# Patient Record
Sex: Female | Born: 1980 | Race: White | Hispanic: No | Smoking: Former smoker
Health system: Southern US, Community
[De-identification: ages and names within clinical notes are randomized; demographics above are authoritative.]

## PROBLEM LIST (undated history)

## (undated) DIAGNOSIS — J40 Bronchitis, not specified as acute or chronic: Secondary | ICD-10-CM

## (undated) DIAGNOSIS — R002 Palpitations: Secondary | ICD-10-CM

## (undated) DIAGNOSIS — N7011 Chronic salpingitis: Secondary | ICD-10-CM

## (undated) DIAGNOSIS — N83299 Other ovarian cyst, unspecified side: Secondary | ICD-10-CM

## (undated) HISTORY — DX: Palpitations: R00.2

## (undated) HISTORY — DX: Chronic salpingitis: N70.11

## (undated) HISTORY — DX: Other ovarian cyst, unspecified side: N83.299

## (undated) HISTORY — PX: ORIF PELVIC FRACTURE: SUR948

---
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
## 2011-10-20 ENCOUNTER — Encounter (HOSPITAL_COMMUNITY): Payer: Self-pay

## 2011-10-20 ENCOUNTER — Emergency Department (HOSPITAL_COMMUNITY): Payer: Self-pay

## 2011-10-20 ENCOUNTER — Emergency Department (HOSPITAL_COMMUNITY)
Admission: EM | Admit: 2011-10-20 | Discharge: 2011-10-20 | Disposition: A | Discharge status: Home / Self Care | Payer: Self-pay | Attending: Emergency Medicine | Admitting: Emergency Medicine

## 2011-10-20 DIAGNOSIS — R109 Unspecified abdominal pain: Secondary | ICD-10-CM | POA: Insufficient documentation

## 2011-10-20 DIAGNOSIS — M549 Dorsalgia, unspecified: Secondary | ICD-10-CM | POA: Insufficient documentation

## 2011-10-20 DIAGNOSIS — R079 Chest pain, unspecified: Secondary | ICD-10-CM | POA: Insufficient documentation

## 2011-10-20 DIAGNOSIS — W1809XA Striking against other object with subsequent fall, initial encounter: Secondary | ICD-10-CM | POA: Insufficient documentation

## 2011-10-20 DIAGNOSIS — S2239XA Fracture of one rib, unspecified side, initial encounter for closed fracture: Secondary | ICD-10-CM

## 2011-10-20 MED ORDER — HYDROCODONE-ACETAMINOPHEN 5-325 MG PO TABS: 1.0000 | ORAL_TABLET | ORAL | Status: AC | PRN

## 2011-10-20 MED ORDER — OXYCODONE-ACETAMINOPHEN 5-325 MG PO TABS
1.0000 | ORAL_TABLET | Freq: Once | ORAL | Status: AC
  Administered 2011-10-20: 1 via ORAL
  Filled 2011-10-20: qty 1

## 2011-10-20 NOTE — ED Notes (Signed)
Pt tripped and fell into the bedpost in the dark, she complains of right rib pain

## 2011-10-20 NOTE — ED Provider Notes (Signed)
History     CSN: [id number]  Arrival date & time 10/20/11  8295   First MD Initiated Contact with Patient 10/20/11 [phone]      Chief Complaint  Patient presents with  . Flank Pain    (Consider location/radiation/quality/duration/timing/severity/associated sxs/prior treatment) HPI Comments: Patient states she was walking in her bedroom in the dark, slipped and fell against the bedpost hitting her posterior right rib area as is been sore for the last couple hours.  It hurts when she takes a deep breath.  There is no external bruising  Patient is a 31 y.o. female presenting with flank pain. The history is provided by the patient.  Flank Pain This is a new problem. The current episode started today. The problem occurs constantly. The problem has been unchanged. Associated symptoms include chest pain. Pertinent negatives include no abdominal pain, congestion, nausea or vomiting.    History reviewed. No pertinent past medical history.  History reviewed. No pertinent past surgical history.  History reviewed. No pertinent family history.  History  Substance Use Topics  . Smoking status: Not on file  . Smokeless tobacco: Not on file  . Alcohol Use: No    OB History    Grav Para Term Preterm Abortions TAB SAB Ect Mult Living                  Review of Systems  Constitutional: Negative for activity change.  HENT: Negative for congestion.   Respiratory: Negative for shortness of breath.   Cardiovascular: Positive for chest pain.  Gastrointestinal: Negative for nausea, vomiting and abdominal pain.  Genitourinary: Positive for flank pain. Negative for hematuria.  Musculoskeletal: Positive for back pain.  Neurological: Negative for dizziness.    Allergies  Review of patient's allergies indicates no known allergies.  Home Medications  No current outpatient prescriptions on file.  BP 126/64  Pulse 86  Temp(Src) 98.8 F (37.1 C) (Oral)  Resp 17  SpO2 100%  Physical Exam    Constitutional: She is oriented to person, place, and time. She appears well-developed and well-nourished.  HENT:  Head: Normocephalic.  Neck: Normal range of motion.  Cardiovascular: Normal rate.   Pulmonary/Chest:       Patient tender over the posterior lateral 8 through 12th ribs.  No external bruising  Abdominal: Soft.  Neurological: She is alert and oriented to person, place, and time.  Skin: Skin is warm and dry.    ED Course  Procedures (including critical care time)  Labs Reviewed - No data to display No results found.   No diagnosis found.    MDM  We'll x-ray ribs        Arman Filter, NP 10/20/11 [phone]

## 2011-10-20 NOTE — ED Provider Notes (Signed)
Medical screening examination/treatment/procedure(s) were performed by non-physician practitioner and as supervising physician I was immediately available for consultation/collaboration.   Celene Kras, MD 10/20/11 [phone]
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
## 2011-10-20 NOTE — Discharge Instructions (Signed)
Your x-ray showed that you have a fracture to your lower rib. You have been given a prescription for pain medication. It is important to take deep breaths several times a day to avoid lung infections. These fractures normally heal on their own and do well. Please return to the ED if you're having shortness of breath, high fever, or any other worrisome symptoms.  Rib Fracture Your caregiver has diagnosed you as having a rib fracture (a break). This can occur by a blow to the chest, by a fall against a hard object, or by violent coughing or sneezing. There may be one or many breaks. Rib fractures may heal on their own within 3 to 8 weeks. The longer healing period is usually associated with a continued cough or other aggravating activities. HOME CARE INSTRUCTIONS   Avoid strenuous activity. Be careful during activities and avoid bumping the injured rib. Activities that cause pain pull on the fracture site(s) and are best avoided if possible.   Eat a normal, well-balanced diet. Drink plenty of fluids to avoid constipation.   Take deep breaths several times a day to keep lungs free of infection. Try to cough several times a day, splinting the injured area with a pillow. This will help prevent pneumonia.   Do not wear a rib belt or binder. These restrict breathing which can lead to pneumonia.   Only take over-the-counter or prescription medicines for pain, discomfort, or fever as directed by your caregiver.  SEEK MEDICAL CARE IF:  You develop a continual cough, associated with thick or bloody sputum. SEEK IMMEDIATE MEDICAL CARE IF:   You have a fever.   You have difficulty breathing.   You have nausea (feeling sick to your stomach), vomiting, or abdominal (belly) pain.   You have worsening pain, not controlled with medications.  Document Released: 08/14/2005 Document Revised: 04/26/2011 Document Reviewed: 01/16/2007 [facility] Patient Information 2012 [place], [place].

## 2012-04-19 ENCOUNTER — Encounter (HOSPITAL_COMMUNITY): Payer: Self-pay | Admitting: Emergency Medicine

## 2012-04-19 ENCOUNTER — Emergency Department (HOSPITAL_COMMUNITY)
Admission: EM | Admit: 2012-04-19 | Discharge: 2012-04-19 | Disposition: A | Discharge status: Home / Self Care | Payer: Self-pay | Attending: Emergency Medicine | Admitting: Emergency Medicine

## 2012-04-19 DIAGNOSIS — S81009A Unspecified open wound, unspecified knee, initial encounter: Secondary | ICD-10-CM | POA: Insufficient documentation

## 2012-04-19 DIAGNOSIS — S91009A Unspecified open wound, unspecified ankle, initial encounter: Secondary | ICD-10-CM | POA: Insufficient documentation

## 2012-04-19 DIAGNOSIS — S5010XA Contusion of unspecified forearm, initial encounter: Secondary | ICD-10-CM | POA: Insufficient documentation

## 2012-04-19 DIAGNOSIS — S40019A Contusion of unspecified shoulder, initial encounter: Secondary | ICD-10-CM | POA: Insufficient documentation

## 2012-04-19 DIAGNOSIS — T148XXA Other injury of unspecified body region, initial encounter: Secondary | ICD-10-CM
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
## 2012-04-19 NOTE — ED Provider Notes (Signed)
Medical screening examination/treatment/procedure(s) were performed by non-physician practitioner and as supervising physician I was immediately available for consultation/collaboration.  Hurman Horn, MD 04/19/12 2147

## 2013-07-12 ENCOUNTER — Emergency Department (HOSPITAL_COMMUNITY)
Admission: EM | Admit: 2013-07-12 | Discharge: 2013-07-12 | Disposition: A | Discharge status: Home / Self Care | Payer: Self-pay | Attending: Emergency Medicine | Admitting: Emergency Medicine

## 2013-07-12 ENCOUNTER — Emergency Department (HOSPITAL_COMMUNITY): Payer: Self-pay

## 2013-07-12 ENCOUNTER — Encounter (HOSPITAL_COMMUNITY): Payer: Self-pay | Admitting: Emergency Medicine

## 2013-07-12 DIAGNOSIS — R05 Cough: Secondary | ICD-10-CM

## 2013-07-12 DIAGNOSIS — Z720 Tobacco use: Secondary | ICD-10-CM

## 2013-07-12 DIAGNOSIS — J029 Acute pharyngitis, unspecified: Secondary | ICD-10-CM | POA: Insufficient documentation

## 2013-07-12 DIAGNOSIS — F17200 Nicotine dependence, unspecified, uncomplicated: Secondary | ICD-10-CM | POA: Insufficient documentation

## 2013-07-12 DIAGNOSIS — H9209 Otalgia, unspecified ear: Secondary | ICD-10-CM | POA: Insufficient documentation

## 2013-07-12 DIAGNOSIS — R062 Wheezing: Secondary | ICD-10-CM | POA: Insufficient documentation

## 2013-07-12 DIAGNOSIS — R059 Cough, unspecified: Secondary | ICD-10-CM | POA: Insufficient documentation

## 2013-07-12 DIAGNOSIS — R079 Chest pain, unspecified: Secondary | ICD-10-CM | POA: Insufficient documentation

## 2013-07-12 MED ORDER — IPRATROPIUM BROMIDE 0.02 % IN SOLN
0.5000 mg | Freq: Once | RESPIRATORY_TRACT | Status: AC
  Administered 2013-07-12: 0.5 mg via RESPIRATORY_TRACT
  Filled 2013-07-12: qty 2.5

## 2013-07-12 MED ORDER — ALBUTEROL SULFATE (5 MG/ML) 0.5% IN NEBU
INHALATION_SOLUTION | RESPIRATORY_TRACT | Status: AC
  Filled 2013-07-12: qty 0.5

## 2013-07-12 MED ORDER — AZITHROMYCIN 250 MG PO TABS: ORAL_TABLET | ORAL | Status: DC

## 2013-07-12 MED ORDER — ALBUTEROL SULFATE (5 MG/ML) 0.5% IN NEBU
5.0000 mg | INHALATION_SOLUTION | Freq: Once | RESPIRATORY_TRACT | Status: AC
  Administered 2013-07-12: 5 mg via RESPIRATORY_TRACT
  Filled 2013-07-12: qty 1

## 2013-07-12 MED ORDER — PREDNISONE 20 MG PO TABS: ORAL_TABLET | ORAL | Status: DC

## 2013-07-12 MED ORDER — ACETAMINOPHEN-CODEINE #3 300-30 MG PO TABS
2.0000 | ORAL_TABLET | Freq: Once | ORAL | Status: AC
  Administered 2013-07-12: 2 via ORAL
  Filled 2013-07-12: qty 2

## 2013-07-12 MED ORDER — PREDNISONE 20 MG PO TABS
60.0000 mg | ORAL_TABLET | Freq: Once | ORAL | Status: AC
  Administered 2013-07-12: 60 mg via ORAL
  Filled 2013-07-12: qty 3

## 2013-07-12 MED ORDER — ALBUTEROL SULFATE HFA 108 (90 BASE) MCG/ACT IN AERS
2.0000 | INHALATION_SPRAY | Freq: Once | RESPIRATORY_TRACT | Status: AC
  Administered 2013-07-12: 2 via RESPIRATORY_TRACT
  Filled 2013-07-12: qty 6.7

## 2013-07-12 NOTE — ED Notes (Signed)
Pt states started with cough x 2 weeks worse in last 3 days.  Hx smoker with no asthma.  No fever noted. Wet cough with yellowish green sputum.  Causing pain to chest with cough and pain with breathing.

## 2013-07-12 NOTE — ED Provider Notes (Signed)
Medical screening examination/treatment/procedure(s) were performed by non-physician practitioner and as supervising physician I was immediately available for consultation/collaboration.    Celene Kras, MD 07/12/13 [phone]

## 2013-07-12 NOTE — ED Provider Notes (Signed)
CSN: [id number]     Arrival date & time 07/12/13  2014 History   First MD Initiated Contact with Patient 07/12/13 2037     Chief Complaint  Patient presents with  . Cough   (Consider location/radiation/quality/duration/timing/severity/associated sxs/prior Treatment) HPI Comments: Patient is a 32 year old female who presents today with 2 weeks of gradually worsening productive cough. She is spitting up green sputum. Her cough is worse at night. She has associated sore throat, ear pain, congestion, rhinorrhea. She denies any recent fevers. She's been taking DayQuil and NyQuil with no relief of her symptoms. Her husband is also sick. She smokes cigarettes, but has been too sick to smoke in the past 2 weeks. She denies any history of lung disease including asthma or COPD. She notes that now she has both right and left-sided rib pain from coughing so much. Her rib pain is worse with palpation.  The history is provided by the patient. No language interpreter was used.    No past medical history on file. Past Surgical History  Procedure Laterality Date  . Orif pelvic fracture     No family history on file. History  Substance Use Topics  . Smoking status: Current Every Day Smoker -- 0.50 packs/day    Types: Cigarettes  . Smokeless tobacco: Not on file  . Alcohol Use: No   OB History   Grav Para Term Preterm Abortions TAB SAB Ect Mult Living                 Review of Systems  Constitutional: Negative for fever and chills.  HENT: Positive for congestion, ear pain, rhinorrhea, sinus pressure, sore throat and voice change.   Respiratory: Positive for cough. Negative for shortness of breath.   Gastrointestinal: Negative for nausea, vomiting and abdominal pain.  All other systems reviewed and are negative.    Allergies  Review of patient's allergies indicates no known allergies.  Home Medications   Current Outpatient Rx  Name  Route  Sig  Dispense  Refill  . guaifenesin (ROBITUSSIN)  100 MG/5ML syrup   Oral   Take 200 mg by mouth 3 (three) times daily as needed for cough.          BP 124/55  Pulse 69  Temp(Src) 98.1 F (36.7 C) (Oral)  Resp 19  Ht 5\' 7"  (1.702 m)  Wt 127 lb (57.607 kg)  BMI 19.89 kg/m2  SpO2 95%  LMP 06/02/2013 Physical Exam  Nursing note and vitals reviewed. Constitutional: She is oriented to person, place, and time. She appears well-developed and well-nourished. No distress.  HENT:  Head: Normocephalic and atraumatic.  Right Ear: Tympanic membrane, external ear and ear canal normal.  Left Ear: Tympanic membrane, external ear and ear canal normal.  Nose: Nose normal.  Mouth/Throat: Uvula is midline, oropharynx is clear and moist and mucous membranes are normal. No oropharyngeal exudate, posterior oropharyngeal edema, posterior oropharyngeal erythema or tonsillar abscesses.  Eyes: Conjunctivae are normal.  Neck: Normal range of motion.  Cardiovascular: Normal rate, regular rhythm and normal heart sounds.   Pulmonary/Chest: Effort normal. No stridor. No respiratory distress. She has wheezes in the right upper field, the right middle field, the right lower field, the left upper field, the left middle field and the left lower field. She has rhonchi. She has no rales.    Abdominal: Soft. She exhibits no distension. There is no tenderness.  Musculoskeletal: Normal range of motion.  Neurological: She is alert and oriented to person, place, and time.  She has normal strength.  Skin: Skin is warm and dry. She is not diaphoretic. No erythema.  Psychiatric: She has a normal mood and affect. Her behavior is normal.    ED Course  Procedures (including critical care time) Labs Review Labs Reviewed - No data to display Imaging Review Dg Chest 2 View  07/12/2013   CLINICAL DATA:  Chest pain, shortness of breath, cough, history of smoking the  EXAM: CHEST  2 VIEW  COMPARISON:  10/20/2011  FINDINGS: Unchanged cardiac silhouette and mediastinal  contours. The lungs remain hyperexpanded with flattening of the bilateral hemidiaphragms. Nipple shadows overlie the bilateral mid lungs, unchanged. No focal airspace opacities. No pleural effusion or pneumothorax. No evidence of edema. No acute osseus abnormalities.  IMPRESSION: Hyperexpanded lungs without acute cardiopulmonary disease.   Electronically Signed   By: Simonne Come M.D.   On: 07/12/2013 21:23    EKG Interpretation   None       MDM   1. Cough   2. Tobacco abuse    Patient ambulated in ED with O2 saturations maintained >90, no current signs of respiratory distress. Lung exam improved after nebulizer treatment. Prednisone given in the ED and pt will be dc with 5 day burst. Pt states they are feeling significantly improved and would like to go home. She was given albuterol inhaler for home in ED. Will also d/c with Z-pack for CAP despite negative chest x ray due to duration of symptoms and patient being higher risk due to cigarette smoking. Return instructions given. Vital signs stable for discharge. Patient / Family / Caregiver informed of clinical course, understand medical decision-making process, and agree with plan.    Mora Bellman, PA-C 07/12/13 [phone]

## 2013-07-19 ENCOUNTER — Emergency Department (HOSPITAL_COMMUNITY)
Admission: EM | Admit: 2013-07-19 | Discharge: 2013-07-19 | Disposition: A | Discharge status: Home / Self Care | Payer: Self-pay | Attending: Emergency Medicine | Admitting: Emergency Medicine

## 2013-07-19 ENCOUNTER — Encounter (HOSPITAL_COMMUNITY): Payer: Self-pay | Admitting: Emergency Medicine

## 2013-07-19 ENCOUNTER — Emergency Department (HOSPITAL_COMMUNITY): Payer: Self-pay

## 2013-07-19 DIAGNOSIS — M25462 Effusion, left knee: Secondary | ICD-10-CM

## 2013-07-19 DIAGNOSIS — Z8709 Personal history of other diseases of the respiratory system: Secondary | ICD-10-CM | POA: Insufficient documentation

## 2013-07-19 DIAGNOSIS — M25469 Effusion, unspecified knee: Secondary | ICD-10-CM | POA: Insufficient documentation

## 2013-07-19 DIAGNOSIS — F17200 Nicotine dependence, unspecified, uncomplicated: Secondary | ICD-10-CM | POA: Insufficient documentation

## 2013-07-19 HISTORY — DX: Bronchitis, not specified as acute or chronic: J40

## 2013-07-19 MED ORDER — IBUPROFEN 800 MG PO TABS: 800.0000 mg | ORAL_TABLET | Freq: Three times a day (TID) | ORAL | Status: DC

## 2013-07-19 MED ORDER — HYDROCODONE-ACETAMINOPHEN 5-325 MG PO TABS
1.0000 | ORAL_TABLET | Freq: Once | ORAL | Status: AC
  Administered 2013-07-19: 1 via ORAL
  Filled 2013-07-19: qty 1

## 2013-07-19 MED ORDER — ALBUTEROL SULFATE HFA 108 (90 BASE) MCG/ACT IN AERS
2.0000 | INHALATION_SPRAY | Freq: Once | RESPIRATORY_TRACT | Status: AC
  Administered 2013-07-19: 2 via RESPIRATORY_TRACT
  Filled 2013-07-19: qty 6.7

## 2013-07-19 MED ORDER — HYDROCODONE-ACETAMINOPHEN 5-325 MG PO TABS: 1.0000 | ORAL_TABLET | Freq: Four times a day (QID) | ORAL | Status: DC | PRN

## 2013-07-19 NOTE — ED Notes (Signed)
Pt states her left knee has been painful for @ 1 month.  Pt states left knee is swollen, but it does not appear so.  Pt denies trauma to left knee.
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
## 2013-07-23 NOTE — ED Provider Notes (Signed)
I personally performed the services described in this documentation, which was scribed in my presence. The recorded information has been reviewed and is accurate.   Roney Marion, MD 07/23/13 [phone]

## 2014-07-12 ENCOUNTER — Emergency Department (HOSPITAL_COMMUNITY)
Admission: EM | Admit: 2014-07-12 | Discharge: 2014-07-12 | Disposition: A | Discharge status: Home / Self Care | Payer: Self-pay | Attending: Emergency Medicine | Admitting: Emergency Medicine

## 2014-07-12 ENCOUNTER — Emergency Department (HOSPITAL_COMMUNITY): Payer: Self-pay

## 2014-07-12 ENCOUNTER — Encounter (HOSPITAL_COMMUNITY): Payer: Self-pay | Admitting: *Deleted

## 2014-07-12 DIAGNOSIS — J019 Acute sinusitis, unspecified: Secondary | ICD-10-CM | POA: Insufficient documentation

## 2014-07-12 DIAGNOSIS — R05 Cough: Secondary | ICD-10-CM

## 2014-07-12 DIAGNOSIS — Z720 Tobacco use: Secondary | ICD-10-CM | POA: Insufficient documentation

## 2014-07-12 DIAGNOSIS — Z791 Long term (current) use of non-steroidal anti-inflammatories (NSAID): Secondary | ICD-10-CM | POA: Insufficient documentation

## 2014-07-12 DIAGNOSIS — R059 Cough, unspecified: Secondary | ICD-10-CM

## 2014-07-12 DIAGNOSIS — Z79899 Other long term (current) drug therapy: Secondary | ICD-10-CM | POA: Insufficient documentation

## 2014-07-12 DIAGNOSIS — M436 Torticollis: Secondary | ICD-10-CM | POA: Insufficient documentation

## 2014-07-12 DIAGNOSIS — M25511 Pain in right shoulder: Secondary | ICD-10-CM | POA: Insufficient documentation

## 2014-07-12 MED ORDER — HYDROCODONE-ACETAMINOPHEN 5-325 MG PO TABS
1.0000 | ORAL_TABLET | Freq: Once | ORAL | Status: AC
  Administered 2014-07-12: 1 via ORAL
  Filled 2014-07-12: qty 1

## 2014-07-12 MED ORDER — HYDROCODONE-ACETAMINOPHEN 5-325 MG PO TABS: ORAL_TABLET | ORAL | Status: DC

## 2014-07-12 MED ORDER — AMOXICILLIN 500 MG PO CAPS: 500.0000 mg | ORAL_CAPSULE | Freq: Three times a day (TID) | ORAL | Status: DC

## 2014-07-12 MED ORDER — AMOXICILLIN 500 MG PO CAPS
500.0000 mg | ORAL_CAPSULE | Freq: Once | ORAL | Status: AC
  Administered 2014-07-12: 500 mg via ORAL
  Filled 2014-07-12: qty 1

## 2014-07-12 NOTE — ED Notes (Signed)
Patient transported to X-ray 

## 2014-07-12 NOTE — Discharge Instructions (Signed)
Use nasal saline (you can try [employer] Simply Saline) at least 4 times a day, use saline 5-10 minutes before using the fluticasone (flonase)  Do not use Afrin (Oxymetazoline)  Rest, wash hands frequently  and drink plenty of water.  Take vicodin for breakthrough pain, do not drink alcohol, drive, care for children or do other critical tasks while taking vicodin.  Take your antibiotics as directed and to completion. You should never have any leftover antibiotics! Push fluids and stay well hydrated.   Do not hesitate to return to the emergency room for any new, worsening or concerning symptoms.  Please obtain primary care using resource guide below. But the minute you were seen in the emergency room and that they will need to obtain records for further outpatient management.

## 2014-07-12 NOTE — ED Provider Notes (Signed)
CSN: [id number]     Arrival date & time 07/12/14  2222 History   First MD Initiated Contact with Patient 07/12/14 2240     Chief Complaint  Patient presents with  . Nasal Congestion  . Torticollis  . Cough     (Consider location/radiation/quality/duration/timing/severity/associated sxs/prior Treatment) HPI   Morgan Donaldson is a 33 y.o. female with no significant past medical history complaining of rhinorrhea, nasal congestion, productive cough, sore throat, hoarse voice and pulling sensation to right lateral trapezius worsening over the course of the week. Patient denies fever, chills, nausea, vomiting, change in bowel or bladder habits. On review of systems she notes a decreased by mouth intake, pleuritic chest pain described as burning, rated at 4 out of 10 and shortness of breath onset yesterday.   Past Medical History  Diagnosis Date  . Bronchitis    Past Surgical History  Procedure Laterality Date  . Orif pelvic fracture     History reviewed. No pertinent family history. History  Substance Use Topics  . Smoking status: Current Every Day Smoker -- 0.50 packs/day    Types: Cigarettes  . Smokeless tobacco: Not on file  . Alcohol Use: No   OB History    No data available     Review of Systems  10 systems reviewed and found to be negative, except as noted in the HPI.    Allergies  Review of patient's allergies indicates no known allergies.  Home Medications   Prior to Admission medications   Medication Sig Start Date End Date Taking? Authorizing Provider  albuterol (PROVENTIL HFA;VENTOLIN HFA) 108 (90 BASE) MCG/ACT inhaler Inhale 1 puff into the lungs every 6 (six) hours as needed for wheezing or shortness of breath.    Historical Provider, MD  HYDROcodone-acetaminophen (NORCO) 5-325 MG per tablet Take 1 tablet by mouth every 6 (six) hours as needed. 07/19/13   Tatyana A Kirichenko, PA-C  ibuprofen (ADVIL,MOTRIN) 200 MG tablet Take 400 mg by mouth every 6 (six)  hours as needed for moderate pain.    Historical Provider, MD  ibuprofen (ADVIL,MOTRIN) 800 MG tablet Take 1 tablet (800 mg total) by mouth 3 (three) times daily. 07/19/13   Tatyana A Kirichenko, PA-C   BP 143/67 mmHg  Pulse 84  Temp(Src) 98 F (36.7 C) (Oral)  Resp 16  Ht 5\' 7"  (1.702 m)  Wt 133 lb (60.328 kg)  BMI 20.83 kg/m2  SpO2 99%  LMP 06/27/2014 (Approximate) Physical Exam  Constitutional: She is oriented to person, place, and time. She appears well-developed and well-nourished. No distress.  Hoarse voice  HENT:  Head: Normocephalic and atraumatic.  Mouth/Throat: Oropharynx is clear and moist.  Positive rhinorrhea, posterior pharynx is mildly injected, no tonsillar hypertrophy or exudate. The bilateral frontal and maxillary sinuses are tender to palpation. Bilateral tympanic membranes with normal architecture light reflex.  Eyes: Conjunctivae and EOM are normal. Pupils are equal, round, and reactive to light.  Neck: Normal range of motion. Neck supple.  FROM to C-spine. Pt can touch chin to chest without discomfort. No TTP of midline cervical spine.  Cardiovascular: Normal rate, regular rhythm and intact distal pulses.   Pulmonary/Chest: Effort normal and breath sounds normal. No stridor. No respiratory distress. She has no wheezes. She has no rales. She exhibits no tenderness.  Abdominal: Soft. Bowel sounds are normal. She exhibits no distension and no mass. There is no tenderness. There is no rebound and no guarding.  Musculoskeletal: Normal range of motion. She exhibits no edema or  tenderness.       Arms: Neurological: She is alert and oriented to person, place, and time.  Psychiatric: She has a normal mood and affect.  Nursing note and vitals reviewed.   ED Course  Procedures (including critical care time) Labs Review Labs Reviewed - No data to display  Imaging Review No results found.   EKG Interpretation None      MDM   Final diagnoses:  Acute sinusitis,  recurrence not specified, unspecified location    Filed Vitals:   07/12/14 2228 07/12/14 2300  BP: 143/67   Pulse: 84   Temp: 98 F (36.7 C) 98.3 F (36.8 C)  TempSrc: Oral   Resp: 16   Height: 5\' 7"  (1.702 m)   Weight: 133 lb (60.328 kg)   SpO2: 99%     Medications  HYDROcodone-acetaminophen (NORCO/VICODIN) 5-325 MG per tablet 1 tablet (1 tablet Oral Given 07/12/14 2307)  amoxicillin (AMOXIL) capsule 500 mg (500 mg Oral Given 07/12/14 2307)    Morgan Donaldson is a 33 y.o. female presenting with rhinorrhea, sinus pressure, hoarse voice, pleuritic CP, SOB. Pt saturating well on RA, CXR w/o infiltrate. PE c/w acute sinuitis.    Evaluation does not show pathology that would require ongoing emergent intervention or inpatient treatment. Pt is hemodynamically stable and mentating appropriately. Discussed findings and plan with patient/guardian, who agrees with care plan. All questions answered. Return precautions discussed and outpatient follow up given.   Discharge Medication List as of 07/12/2014 11:05 PM    START taking these medications   Details  amoxicillin (AMOXIL) 500 MG capsule Take 1 capsule (500 mg total) by mouth 3 (three) times daily., Starting 07/12/2014, Until Discontinued, Print            Wynetta Emeryicole Amire Leazer, PA-C 07/12/14 2346  Lyanne CoKevin M Campos, MD 07/15/14 [phone]

## 2014-07-12 NOTE — ED Notes (Signed)
Pt c/o nasal congestion, cough, and right neck/shoulder stiffness for a week. No reported fevers at home. Pt is taking OTC medications without relief.

## 2015-04-21 ENCOUNTER — Emergency Department (HOSPITAL_COMMUNITY)
Admission: EM | Admit: 2015-04-21 | Discharge: 2015-04-21 | Disposition: A | Discharge status: Home / Self Care | Payer: Self-pay | Attending: Emergency Medicine | Admitting: Emergency Medicine

## 2015-04-21 ENCOUNTER — Encounter (HOSPITAL_COMMUNITY): Payer: Self-pay | Admitting: Adult Health

## 2015-04-21 DIAGNOSIS — Z720 Tobacco use: Secondary | ICD-10-CM | POA: Insufficient documentation

## 2015-04-21 DIAGNOSIS — J040 Acute laryngitis: Secondary | ICD-10-CM | POA: Insufficient documentation

## 2015-04-21 DIAGNOSIS — Z79899 Other long term (current) drug therapy: Secondary | ICD-10-CM | POA: Insufficient documentation

## 2015-04-21 MED ORDER — NAPROXEN 250 MG PO TABS: 250.0000 mg | ORAL_TABLET | Freq: Two times a day (BID) | ORAL | Status: DC

## 2015-04-21 MED ORDER — PREDNISONE 20 MG PO TABS: 40.0000 mg | ORAL_TABLET | Freq: Every day | ORAL | Status: DC

## 2015-04-21 MED ORDER — BENZONATATE 100 MG PO CAPS: 200.0000 mg | ORAL_CAPSULE | Freq: Two times a day (BID) | ORAL | Status: DC | PRN

## 2015-04-21 NOTE — ED Notes (Signed)
Presents with sore throat and hoarse voice-started about 3-4 days she has been sent home from work 2 times. She is drinking mountain dew and handling secretions. Denies fevers

## 2015-04-21 NOTE — ED Notes (Signed)
Pt is a pack a day smoker, but has reduced her smoking to approx 7-10/day cigarettes during her illness

## 2015-04-21 NOTE — Discharge Instructions (Signed)
Laryngitis °At the top of your windpipe is your voice box. It is the source of your voice. Inside your voice box are 2 bands of muscles called vocal cords. When you breathe, your vocal cords are relaxed and open so that air can get into the lungs. When you decide to say something, these cords come together and vibrate. The sound from these vibrations goes into your throat and comes out through your mouth as sound.  °Laryngitis is an inflammation of the vocal cords that causes hoarseness, cough, loss of voice, sore throat, and dry throat. Laryngitis can be temporary (acute) or long-term (chronic). Most cases of acute laryngitis improve with time.Chronic laryngitis lasts for more than 3 weeks. °CAUSES °Laryngitis can often be related to excessive smoking, talking, or yelling, as well as inhalation of toxic fumes and allergies. Acute laryngitis is usually caused by a viral infection, vocal strain, measles or mumps, or bacterial infections. Chronic laryngitis is usually caused by vocal cord strain, vocal cord injury, postnasal drip, growths on the vocal cords, or acid reflux. °SYMPTOMS  °· Cough. °· Sore throat. °· Dry throat. °RISK FACTORS °· Respiratory infections. °· Exposure to irritating substances, such as cigarette smoke, excessive amounts of alcohol, stomach acids, and workplace chemicals. °· Voice trauma, such as vocal cord injury from shouting or speaking too loud. °DIAGNOSIS  °Your cargiver will perform a physical exam. During the physical exam, your caregiver will examine your throat. The most common sign of laryngitis is hoarseness. Laryngoscopy may be necessary to confirm the diagnosis of this condition. This procedure allows your caregiver to look into the larynx. °HOME CARE INSTRUCTIONS °· Drink enough fluids to keep your urine clear or pale yellow. °· Rest until you no longer have symptoms or as directed by your caregiver. °· Breathe in moist air. °· Take all medicine as directed by your  caregiver. °· Do not smoke. °· Talk as little as possible (this includes whispering). °· Write on paper instead of talking until your voice is back to normal. °· Follow up with your caregiver if your condition has not improved after 10 days. °SEEK MEDICAL CARE IF:  °· You have trouble breathing. °· You cough up blood. °· You have persistent fever. °· You have increasing pain. °· You have difficulty swallowing. °MAKE SURE YOU: °· Understand these instructions. °· Will watch your condition. °· Will get help right away if you are not doing well or get worse. °Document Released: 08/14/2005 Document Revised: 11/06/2011 Document Reviewed: 10/20/2010 °[employer]® Patient Information ©2015 [employer]. This information is not intended to replace advice given to you by your health care provider. Make sure you discuss any questions you have with your health care provider. ° °Viral Infections °A viral infection can be caused by different types of viruses. Most viral infections are not serious and resolve on their own. However, some infections may cause severe symptoms and may lead to further complications. °SYMPTOMS °Viruses can frequently cause: °· Minor sore throat. °· Aches and pains. °· Headaches. °· Runny nose. °· Different types of rashes. °· Watery eyes. °· Tiredness. °· Cough. °· Loss of appetite. °· Gastrointestinal infections, resulting in nausea, vomiting, and diarrhea. °These symptoms do not respond to antibiotics because the infection is not caused by bacteria. However, you might catch a bacterial infection following the viral infection. This is sometimes called a "superinfection." Symptoms of such a bacterial infection may include: °· Worsening sore throat with pus and difficulty swallowing. °· Swollen neck glands. °· Chills and a high   or persistent fever. °· Severe headache. °· Tenderness over the sinuses. °· Persistent overall ill feeling (malaise), muscle aches, and tiredness (fatigue). °· Persistent  cough. °· Yellow, green, or brown mucus production with coughing. °HOME CARE INSTRUCTIONS  °· Only take over-the-counter or prescription medicines for pain, discomfort, diarrhea, or fever as directed by your caregiver. °· Drink enough water and fluids to keep your urine clear or pale yellow. Sports drinks can provide valuable electrolytes, sugars, and hydration. °· Get plenty of rest and maintain proper nutrition. Soups and broths with crackers or rice are fine. °SEEK IMMEDIATE MEDICAL CARE IF:  °· You have severe headaches, shortness of breath, chest pain, neck pain, or an unusual rash. °· You have uncontrolled vomiting, diarrhea, or you are unable to keep down fluids. °· You or your child has an oral temperature above 102° F (38.9° C), not controlled by medicine. °· Your baby is older than 3 months with a rectal temperature of 102° F (38.9° C) or higher. °· Your baby is 3 months old or younger with a rectal temperature of 100.4° F (38° C) or higher. °MAKE SURE YOU:  °· Understand these instructions. °· Will watch your condition. °· Will get help right away if you are not doing well or get worse. °Document Released: 05/24/2005 Document Revised: 11/06/2011 Document Reviewed: 12/19/2010 °Exit[employer]® Patient Information ©2015 [employer]. This information is not intended to replace advice given to you by your health care provider. Make sure you discuss any questions you have with your health care provider. ° °

## 2015-04-21 NOTE — ED Provider Notes (Signed)
CSN: [id number]     Arrival date & time 04/21/15  4098 History  This chart was scribed for non-physician practitioner, Everlene Farrier, PA-C working with Samuel Jester, DO by Gwenyth Ober, ED scribe. This patient was seen in room TR11C/TR11C and the patient's care was started at 8:03 PM   Chief Complaint  Patient presents with  . Laryngitis   The history is provided by the patient. No language interpreter was used.    HPI Comments: Morgan Donaldson is a 34 y.o. female who presents to the Emergency Department complaining of a constant, moderate hoarse voice that started 3 days ago. She states dry cough and wheezing as associated symptoms. Pt has tried drinking hot tea with honey with no relief. Pt works as a Production assistant, radio. She denies recent sick contact. Pt also denies rhinorrhea, post-nasal drip, trouble swallowing, ear pain, neck pain, HA, changes to vision, fever, chills, CP, SOB, chest tightness, abdominal pain, nausea, vomiting and rash as associated symptoms. Patient is a smoker.    Past Medical History  Diagnosis Date  . Bronchitis    Past Surgical History  Procedure Laterality Date  . Orif pelvic fracture     History reviewed. No pertinent family history. Social History  Substance Use Topics  . Smoking status: Current Every Day Smoker -- 0.50 packs/day    Types: Cigarettes  . Smokeless tobacco: None  . Alcohol Use: No   OB History    No data available     Review of Systems  Constitutional: Negative for fever and chills.  HENT: Positive for voice change. Negative for ear pain, postnasal drip, rhinorrhea, sore throat and trouble swallowing.   Eyes: Negative for visual disturbance.  Respiratory: Positive for cough. Negative for chest tightness and shortness of breath.   Cardiovascular: Negative for chest pain.  Gastrointestinal: Negative for nausea, vomiting and abdominal pain.  Skin: Negative for rash.  Neurological: Negative for light-headedness and headaches.       Allergies  Review of patient's allergies indicates no known allergies.  Home Medications   Prior to Admission medications   Medication Sig Start Date End Date Taking? Authorizing Provider  albuterol (PROVENTIL HFA;VENTOLIN HFA) 108 (90 BASE) MCG/ACT inhaler Inhale 1 puff into the lungs every 6 (six) hours as needed for wheezing or shortness of breath.    Historical Provider, MD  amoxicillin (AMOXIL) 500 MG capsule Take 1 capsule (500 mg total) by mouth 3 (three) times daily. 07/12/14   Nicole Pisciotta, PA-C  benzonatate (TESSALON) 100 MG capsule Take 2 capsules (200 mg total) by mouth 2 (two) times daily as needed for cough. 04/21/15   Everlene Farrier, PA-C  HYDROcodone-acetaminophen (NORCO/VICODIN) 5-325 MG per tablet Take 1-2 tablets by mouth every 6 hours as needed for pain. 07/12/14   Nicole Pisciotta, PA-C  ibuprofen (ADVIL,MOTRIN) 200 MG tablet Take 400 mg by mouth every 6 (six) hours as needed for moderate pain.    Historical Provider, MD  ibuprofen (ADVIL,MOTRIN) 800 MG tablet Take 1 tablet (800 mg total) by mouth 3 (three) times daily. 07/19/13   Tatyana Kirichenko, PA-C  naproxen (NAPROSYN) 250 MG tablet Take 1 tablet (250 mg total) by mouth 2 (two) times daily with a meal. 04/21/15   Everlene Farrier, PA-C  predniSONE (DELTASONE) 20 MG tablet Take 2 tablets (40 mg total) by mouth daily. 04/21/15   Everlene Farrier, PA-C   BP 112/43 mmHg  Pulse 66  Temp(Src) 98.4 F (36.9 C) (Oral)  Resp 18  Ht 5\' 7"  (1.702 m)  Wt 130 lb (58.968 kg)  BMI 20.36 kg/m2  SpO2 99% Physical Exam  Constitutional: She is oriented to person, place, and time. She appears well-developed and well-nourished. No distress.  Non-toxic appearing  HENT:  Head: Normocephalic and atraumatic.  Right Ear: External ear normal.  Left Ear: External ear normal.  Mouth/Throat: Oropharynx is clear and moist. No oropharyngeal exudate.  No tonsillar hypertrophy or exudates. Uvula midline, no edema. No  post-oropharyngeal erythema or edema. Bilateral tympanic membranes are pearly-gray without erythema or loss of landmarks.   Eyes: Conjunctivae are normal. Pupils are equal, round, and reactive to light. Right eye exhibits no discharge. Left eye exhibits no discharge.  Neck: Normal range of motion. Neck supple. No JVD present. No tracheal deviation present.  Cardiovascular: Normal rate, regular rhythm, normal heart sounds and intact distal pulses.   Pulmonary/Chest: Effort normal and breath sounds normal. No respiratory distress. She has no wheezes. She has no rales.  Lungs are clear to auscultation bilaterally. No wheezes or rhonchi.  Abdominal: Soft. There is no tenderness.  Lymphadenopathy:    She has no cervical adenopathy.  Neurological: She is alert and oriented to person, place, and time. Coordination normal.  Skin: Skin is warm and dry. No rash noted. She is not diaphoretic.  Psychiatric: She has a normal mood and affect. Her behavior is normal.  Nursing note and vitals reviewed.   ED Course  Procedures   DIAGNOSTIC STUDIES: Oxygen Saturation is 99% on RA, normal by my interpretation.    COORDINATION OF CARE: 8:08 PM Discussed treatment plan with pt which includes prednisone, Tessalon Pearls and Naprosyn. Pt agreed to plan.   Labs Review Labs Reviewed - No data to display  Imaging Review No results found.   EKG Interpretation None      Filed Vitals:   04/21/15 1940  BP: 112/43  Pulse: 66  Temp: 98.4 F (36.9 C)  TempSrc: Oral  Resp: 18  Height:  (1.702 m)  Weight: 130 lb (58.968 kg)  SpO2: 99%     MDM   Meds given in ED:  Medications - No data to display  New Prescriptions   BENZONATATE (TESSALON) 100 MG CAPSULE    Take 2 capsules (200 mg total) by mouth 2 (two) times daily as needed for cough.   NAPROXEN (NAPROSYN) 250 MG TABLET    Take 1 tablet (250 mg total) by mouth 2 (two) times daily with a meal.   PREDNISONE (DELTASONE) 20 MG TABLET    Take  2 tablets (40 mg total) by mouth daily.    Final diagnoses:  Acute laryngitis   This is a 35 y.o. female who presents to the Emergency Department complaining of a constant, moderate hoarse voice that started 3 days ago. She states dry cough and wheezing as associated symptoms.  She denies trouble swallowing. She denies any fevers or chills. The patient is a smoker. On exam the patient is afebrile and nontoxic appearing. She has no tonsillar hypertrophy or exudates. No posterior oropharyngeal edema. Lungs clear to auscultation bilaterally. Patient's symptoms and exam are consistent with laryngitis and URI. Will discharge prescriptions for Tessalon Perles, Prednisone and naproxen. I advised the patient to follow-up with their primary care provider this week. I advised the patient to return to the emergency department with new or worsening symptoms or new concerns. The patient verbalized understanding and agreement with plan.    I personally performed the services described in this documentation, which was scribed in my presence. The  recorded information has been reviewed and is accurate.    Everlene Farrier, PA-C 04/21/15 2023  Samuel Jester, DO 04/23/15 1356

## 2015-06-17 ENCOUNTER — Emergency Department (HOSPITAL_COMMUNITY)
Admission: EM | Admit: 2015-06-17 | Discharge: 2015-06-17 | Disposition: A | Discharge status: Home / Self Care | Payer: Self-pay | Attending: Emergency Medicine | Admitting: Emergency Medicine

## 2015-06-17 ENCOUNTER — Emergency Department (HOSPITAL_COMMUNITY): Payer: Self-pay

## 2015-06-17 ENCOUNTER — Encounter (HOSPITAL_COMMUNITY): Payer: Self-pay | Admitting: Emergency Medicine

## 2015-06-17 DIAGNOSIS — Z792 Long term (current) use of antibiotics: Secondary | ICD-10-CM | POA: Insufficient documentation

## 2015-06-17 DIAGNOSIS — S8992XA Unspecified injury of left lower leg, initial encounter: Secondary | ICD-10-CM | POA: Insufficient documentation

## 2015-06-17 DIAGNOSIS — W010XXA Fall on same level from slipping, tripping and stumbling without subsequent striking against object, initial encounter: Secondary | ICD-10-CM | POA: Insufficient documentation

## 2015-06-17 DIAGNOSIS — Z720 Tobacco use: Secondary | ICD-10-CM | POA: Insufficient documentation

## 2015-06-17 DIAGNOSIS — Z7952 Long term (current) use of systemic steroids: Secondary | ICD-10-CM | POA: Insufficient documentation

## 2015-06-17 DIAGNOSIS — Y9389 Activity, other specified: Secondary | ICD-10-CM | POA: Insufficient documentation

## 2015-06-17 DIAGNOSIS — Z79899 Other long term (current) drug therapy: Secondary | ICD-10-CM | POA: Insufficient documentation

## 2015-06-17 DIAGNOSIS — Z791 Long term (current) use of non-steroidal anti-inflammatories (NSAID): Secondary | ICD-10-CM | POA: Insufficient documentation

## 2015-06-17 DIAGNOSIS — Y990 Civilian activity done for income or pay: Secondary | ICD-10-CM | POA: Insufficient documentation

## 2015-06-17 DIAGNOSIS — Y92090 Kitchen in other non-institutional residence as the place of occurrence of the external cause: Secondary | ICD-10-CM | POA: Insufficient documentation

## 2015-06-17 DIAGNOSIS — M25562 Pain in left knee: Secondary | ICD-10-CM

## 2015-06-17 MED ORDER — OXYCODONE-ACETAMINOPHEN 5-325 MG PO TABS: 2.0000 | ORAL_TABLET | ORAL | Status: DC | PRN

## 2015-06-17 MED ORDER — IBUPROFEN 800 MG PO TABS: 800.0000 mg | ORAL_TABLET | Freq: Three times a day (TID) | ORAL | Status: DC

## 2015-06-17 MED ORDER — OXYCODONE-ACETAMINOPHEN 5-325 MG PO TABS
1.0000 | ORAL_TABLET | Freq: Once | ORAL | Status: AC
  Administered 2015-06-17: 1 via ORAL
  Filled 2015-06-17: qty 1

## 2015-06-17 MED ORDER — IBUPROFEN 400 MG PO TABS
800.0000 mg | ORAL_TABLET | Freq: Once | ORAL | Status: DC
  Filled 2015-06-17: qty 2

## 2015-06-17 NOTE — ED Provider Notes (Signed)
CSN: [id number]     Arrival date & time 06/17/15  2131 History  By signing my name below, I, Lyndel SafeKaitlyn Shelton, attest that this documentation has been prepared under the direction and in the presence of Jaila Schellhorn, PA-C. Electronically Signed: Lyndel SafeKaitlyn Shelton, ED Scribe. 06/17/2015. 9:50 PM.   Chief Complaint  Patient presents with  . Knee Injury   The history is provided by the patient. No language interpreter was used.   HPI Comments: Morgan Donaldson is a 34 y.o. female, with a PMhx of arthritis in left knee, who presents to the Emergency Department complaining of sudden onset, constant, 5/10, lateral and medial left knee pain that woke her up from sleep early this morning s/p twist injury that occurred 2 days ago. 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 She notes a PMhx of arthritis in her left knee. Denies LOC or head injury during the fall, neck pain, back pain, weakness, numbness or tingling. She is ambulatory but with pain.  No overlying skin changes or swelling to left knee. Pt is requesting a work note as she missed work today due to her pain with ambulation. Pt has tried tylenol earlier today with no decrease in pain.   Past Medical History  Diagnosis Date  . Bronchitis    Past Surgical History  Procedure Laterality Date  . Orif pelvic fracture     No family history on file. Social History  Substance Use Topics  . Smoking status: Current Every Day Smoker -- 0.00 packs/day    Types: Cigarettes  . Smokeless tobacco: None  . Alcohol Use: No   OB History    No data available     Review of Systems  Genitourinary: Negative for pelvic pain.  Musculoskeletal: Positive for arthralgias ( left knee). Negative for back pain, joint swelling, gait problem and neck pain.  Skin: Negative for color change and wound.  Neurological: Negative for syncope, weakness and numbness.   Allergies   Review of patient's allergies indicates no known allergies.  Home Medications   Prior to Admission medications   Medication Sig Start Date End Date Taking? Authorizing Provider  albuterol (PROVENTIL HFA;VENTOLIN HFA) 108 (90 BASE) MCG/ACT inhaler Inhale 1 puff into the lungs every 6 (six) hours as needed for wheezing or shortness of breath.    Historical Provider, MD  amoxicillin (AMOXIL) 500 MG capsule Take 1 capsule (500 mg total) by mouth 3 (three) times daily. 07/12/14   Nicole Pisciotta, PA-C  benzonatate (TESSALON) 100 MG capsule Take 2 capsules (200 mg total) by mouth 2 (two) times daily as needed for cough. 04/21/15   Everlene FarrierWilliam Dansie, PA-C  HYDROcodone-acetaminophen (NORCO/VICODIN) 5-325 MG per tablet Take 1-2 tablets by mouth every 6 hours as needed for pain. 07/12/14   Nicole Pisciotta, PA-C  ibuprofen (ADVIL,MOTRIN) 200 MG tablet Take 400 mg by mouth every 6 (six) hours as needed for moderate pain.    Historical Provider, MD  ibuprofen (ADVIL,MOTRIN) 800 MG tablet Take 1 tablet (800 mg total) by mouth 3 (three) times daily. 07/19/13   Tatyana Kirichenko, PA-C  ibuprofen (ADVIL,MOTRIN) 800 MG tablet Take 1 tablet (800 mg total) by mouth 3 (three) times daily. 06/17/15   Wilborn Membreno C Tamila Gaulin, PA-C  naproxen (NAPROSYN) 250 MG tablet Take 1 tablet (250 mg total) by mouth 2 (two) times daily with a meal. 04/21/15   Everlene FarrierWilliam Dansie, PA-C  oxyCODONE-acetaminophen (  PERCOCET/ROXICET) 5-325 MG tablet Take 2 tablets by mouth every 4 (four) hours as needed for severe pain. 06/17/15   Chalese Peach C Zyra Parrillo, PA-C  predniSONE (DELTASONE) 20 MG tablet Take 2 tablets (40 mg total) by mouth daily. 04/21/15   Everlene Farrier, PA-C   BP 121/50 mmHg  Pulse 86  Temp(Src) 98.3 F (36.8 C) (Oral)  Resp 18  Ht  (1.702 m)  Wt 135 lb (61.236 kg)  BMI 21.14 kg/m2  SpO2 100%  LMP 05/25/2015 Physical Exam  Constitutional: She appears well-developed and well-nourished. No distress.  HENT:  Head: Normocephalic and  atraumatic.  Eyes: Conjunctivae are normal.  Neck: Normal range of motion. Neck supple.  Cardiovascular: Normal rate, regular rhythm and intact distal pulses.   Pulmonary/Chest: Effort normal. No respiratory distress.  Musculoskeletal:  Decreased ROM with flexion and extension of knee due to pain. No laxity or effusion noted.   Neurological: She is alert. Coordination normal.  No neurologic deficits. Strength 5/5. Hobbling gait, pt states due to pain.   Skin: Skin is warm and dry.  Psychiatric: She has a normal mood and affect. Her behavior is normal.  Nursing note and vitals reviewed.   ED Course  Procedures  DIAGNOSTIC STUDIES: Oxygen Saturation is 100% on RA, normal by my interpretation.    COORDINATION OF CARE: 9:49 PM Discussed treatment plan with pt at bedside and pt agreed to plan. Xray of left knee ordered. Will order ibuprofen for pain management and write a work note.   Imaging Review Dg Knee Complete 4 Views Left  06/17/2015  CLINICAL DATA:  Left knee pain after fall tonight. EXAM: LEFT KNEE - COMPLETE 4+ VIEW COMPARISON:  07/19/2013 FINDINGS: No fracture or dislocation. The alignment and joint spaces are maintained. Tiny patellar spurs. Small joint effusion. Mild anterior soft tissue edema. IMPRESSION: Small joint effusion.  No acute fracture or dislocation. Electronically Signed   By: Rubye Oaks M.D.   On: 06/17/2015 22:15   I have personally reviewed and evaluated these images as part of my medical decision-making.   MDM   Final diagnoses:  Left knee pain    Morgan Donaldson presents with left knee pain following a fall while at work.   Pt shows no indication for imaging beyond xray here in the ED. Xray revealed small knee joint effusion, but no fracture or displacement. Outpatient referral for orthopedics. Pt discharged with ibuprofen, knee brace, and crutches. Plan communicated to pt and her husband, both agreed to all aspects of the plan.  I personally  performed the services described in this documentation, which was scribed in my presence. The recorded information has been reviewed and is accurate.     Anselm Pancoast, PA-C 06/17/15 2225  Doug Sou, MD 06/18/15 [phone]

## 2015-06-17 NOTE — ED Notes (Signed)
Pt. slipped and fell at work last Tuesday , reports pain at left knee , denies LOC / ambulatory .

## 2015-06-17 NOTE — Discharge Instructions (Signed)
You have been seen tonight for knee pain. Your xray showed no evidence of fracture. You are being referred to outpatient orthopedics. Contact their office as soon as you can to set up an appointment. You are also being given a knee brace and crutches. It is recommended you use these whenever possible, until told otherwise by a medical provider. Elevation and icing of the knee may also give some relief. Follow up with PCP as needed. Return to ED should symptoms worsen.  Knee Effusion  Knee effusion means that you have excess fluid in your knee joint. This can cause pain and swelling in your knee. This may make your knee more difficult to bend and move. That is because there is increased pain and pressure in the joint. If there is fluid in your knee, it often means that something is wrong inside your knee, such as severe arthritis, abnormal inflammation, or an infection. Another common cause of knee effusion is an injury to the knee muscles, ligaments, or cartilage.  HOME CARE INSTRUCTIONS  Use crutches as directed by your health care provider.  Wear a knee brace as directed by your health care provider.  Apply ice to the swollen area:  Put ice in a plastic bag.  Place a towel between your skin and the bag.  Leave the ice on for 20 minutes, 2-3 times per day. Keep your knee raised (elevated) when you are sitting or lying down.  Take medicines only as directed by your health care provider.  Do any rehabilitation or strengthening exercises as directed by your health care provider.  Rest your knee as directed by your health care provider. You may start doing your normal activities again when your health care provider approves.  Keep all follow-up visits as directed by your health care provider. This is important. SEEK MEDICAL CARE IF:  You have ongoing (persistent) pain in your knee. SEEK IMMEDIATE MEDICAL CARE IF:  You have increased swelling or redness of your knee.  You have severe pain in your  knee.  You have a fever. This information is not intended to replace advice given to you by your health care provider. Make sure you discuss any questions you have with your health care provider.  Document Released: 11/04/2003 Document Revised: 09/04/2014 Document Reviewed: 03/30/2014  Elsevier Interactive Patient Education 2016 Elsevier Inc.    Cryotherapy Cryotherapy means treatment with cold. Ice or gel packs can be used to reduce both pain and swelling. Ice is the most helpful within the first 24 to 48 hours after an injury or flare-up from overusing a muscle or joint. Sprains, strains, spasms, burning pain, shooting pain, and aches can all be eased with ice. Ice can also be used when recovering from surgery. Ice is effective, has very few side effects, and is safe for most people to use. PRECAUTIONS  Ice is not a safe treatment option for people with:  Raynaud phenomenon. This is a condition affecting small blood vessels in the extremities. Exposure to cold may cause your problems to return.  Cold hypersensitivity. There are many forms of cold hypersensitivity, including:  Cold urticaria. Red, itchy hives appear on the skin when the tissues begin to warm after being iced.  Cold erythema. This is a red, itchy rash caused by exposure to cold.  Cold hemoglobinuria. Red blood cells break down when the tissues begin to warm after being iced. The hemoglobin that carry oxygen are passed into the urine because they cannot combine with blood proteins fast  enough.  Numbness or altered sensitivity in the area being iced. If you have any of the following conditions, do not use ice until you have discussed cryotherapy with your caregiver:  Heart conditions, such as arrhythmia, angina, or chronic heart disease.  High blood pressure.  Healing wounds or open skin in the area being iced.  Current infections.  Rheumatoid arthritis.  Poor circulation.  Diabetes. Ice slows the blood flow in  the region it is applied. This is beneficial when trying to stop inflamed tissues from spreading irritating chemicals to surrounding tissues. However, if you expose your skin to cold temperatures for too long or without the proper protection, you can damage your skin or nerves. Watch for signs of skin damage due to cold. HOME CARE INSTRUCTIONS Follow these tips to use ice and cold packs safely.  Place a dry or damp towel between the ice and skin. A damp towel will cool the skin more quickly, so you may need to shorten the time that the ice is used.  For a more rapid response, add gentle compression to the ice.  Ice for no more than 10 to 20 minutes at a time. The bonier the area you are icing, the less time it will take to get the benefits of ice.  Check your skin after 5 minutes to make sure there are no signs of a poor response to cold or skin damage.  Rest 20 minutes or more between uses.  Once your skin is numb, you can end your treatment. You can test numbness by very lightly touching your skin. The touch should be so light that you do not see the skin dimple from the pressure of your fingertip. When using ice, most people will feel these normal sensations in this order: cold, burning, aching, and numbness.  Do not use ice on someone who cannot communicate their responses to pain, such as small children or people with dementia. HOW TO MAKE AN ICE PACK Ice packs are the most common way to use ice therapy. Other methods include ice massage, ice baths, and cryosprays. Muscle creams that cause a cold, tingly feeling do not offer the same benefits that ice offers and should not be used as a substitute unless recommended by your caregiver. To make an ice pack, do one of the following:  Place crushed ice or a bag of frozen vegetables in a sealable plastic bag. Squeeze out the excess air. Place this bag inside another plastic bag. Slide the bag into a pillowcase or place a damp towel between your  skin and the bag.  Mix 3 parts water with 1 part rubbing alcohol. Freeze the mixture in a sealable plastic bag. When you remove the mixture from the freezer, it will be slushy. Squeeze out the excess air. Place this bag inside another plastic bag. Slide the bag into a pillowcase or place a damp towel between your skin and the bag. SEEK MEDICAL CARE IF:  You develop white spots on your skin. This may give the skin a blotchy (mottled) appearance.  Your skin turns blue or pale.  Your skin becomes waxy or hard.  Your swelling gets worse. MAKE SURE YOU:   Understand these instructions.  Will watch your condition.  Will get help right away if you are not doing well or get worse.   This information is not intended to replace advice given to you by your health care provider. Make sure you discuss any questions you have with your health care  provider.   Document Released: 04/10/2011 Document Revised: 09/04/2014 Document Reviewed: 04/10/2011 Elsevier Interactive Patient Education 2016 [employer].  How to Use a Knee Brace A knee brace is a device that you wear to support your knee, especially if the knee is healing after an injury or surgery. There are several types of knee braces. Some are designed to prevent an injury (prophylactic brace). These are often worn during sports. Others support an injured knee (functional brace) or keep it still while it heals (rehabilitative brace). People with severe arthritis of the knee may benefit from a brace that takes some pressure off the knee (unloader brace). Most knee braces are made from a combination of cloth and metal or plastic.  You may need to wear a knee brace to:  Relieve knee pain.  Help your knee support your weight (improve stability).  Help you walk farther (improve mobility).  Prevent injury.  Support your knee while it heals from surgery or from an injury. RISKS AND COMPLICATIONS Generally, knee braces are very safe to wear.  However, problems may occur, including:  Skin irritation that may lead to infection.  Making your condition worse if you wear the brace in the wrong way. HOW TO USE A KNEE BRACE Different braces will have different instructions for use. Your health care provider will tell you or show you:  How to put on your brace.  How to adjust the brace.  When and how often to wear the brace.  How to remove the brace.  If you will need any assistive devices in addition to the brace, such as crutches or a cane. In general, your brace should:  Have the hinge of the brace line up with the bend of your knee.  Have straps, hooks, or tapes that fasten snugly around your leg.  Not feel too tight or too loose. HOW TO CARE FOR A KNEE BRACE  Check your brace often for signs of damage, such as loose connections or attachments. Your knee brace may get damaged or wear out during normal use.  Wash the fabric parts of your brace with soap and water.  Read the insert that comes with your brace for other specific care instructions. SEEK MEDICAL CARE IF:  Your knee brace is too loose or too tight and you cannot adjust it.  Your knee brace causes skin redness, swelling, bruising, or irritation.  Your knee brace is not helping.  Your knee brace is making your knee pain worse.   This information is not intended to replace advice given to you by your health care provider. Make sure you discuss any questions you have with your health care provider.   Document Released: 11/04/2003 Document Revised: 05/05/2015 Document Reviewed: 12/07/2014 Elsevier Interactive Patient Education 2016 Elsevier Inc.  Joint Pain Joint pain, which is also called arthralgia, can be caused by many things. Joint pain often goes away when you follow your health care provider's instructions for relieving pain at home. However, joint pain can also be caused by conditions that require further treatment. Common causes of joint pain  include:  Bruising in the area of the joint.  Overuse of the joint.  Wear and tear on the joints that occur with aging (osteoarthritis).  Various other forms of arthritis.  A buildup of a crystal form of uric acid in the joint (gout).  Infections of the joint (septic arthritis) or of the bone (osteomyelitis). Your health care provider may recommend medicine to help with the pain. If your  joint pain continues, additional tests may be needed to diagnose your condition. HOME CARE INSTRUCTIONS Watch your condition for any changes. Follow these instructions as directed to lessen the pain that you are feeling.  Take medicines only as directed by your health care provider.  Rest the affected area for as long as your health care provider says that you should. If directed to do so, raise the painful joint above the level of your heart while you are sitting or lying down.  Do not do things that cause or worsen pain.  If directed, apply ice to the painful area:  Put ice in a plastic bag.  Place a towel between your skin and the bag.  Leave the ice on for 20 minutes, 2-3 times per day.  Wear an elastic bandage, splint, or sling as directed by your health care provider. Loosen the elastic bandage or splint if your fingers or toes become numb and tingle, or if they turn cold and blue.  Begin exercising or stretching the affected area as directed by your health care provider. Ask your health care provider what types of exercise are safe for you.  Keep all follow-up visits as directed by your health care provider. This is important. SEEK MEDICAL CARE IF:  Your pain increases, and medicine does not help.  Your joint pain does not improve within 3 days.  You have increased bruising or swelling.  You have a fever.  You lose 10 lb (4.5 kg) or more without trying. SEEK IMMEDIATE MEDICAL CARE IF:  You are not able to move the joint.  Your fingers or toes become numb or they turn cold and  blue.   This information is not intended to replace advice given to you by your health care provider. Make sure you discuss any questions you have with your health care provider.   Document Released: 08/14/2005 Document Revised: 09/04/2014 Document Reviewed: 05/26/2014 Elsevier Interactive Patient Education 2016 [employer].    Emergency Department Resource Guide 1) Find a Doctor and Pay Out of Pocket Although you won't have to find out who is covered by your insurance plan, it is a good idea to ask around and get recommendations. You will then need to call the office and see if the doctor you have chosen will accept you as a new patient and what types of options they offer for patients who are self-pay. Some doctors offer discounts or will set up payment plans for their patients who do not have insurance, but you will need to ask so you aren't surprised when you get to your appointment.  2) Contact Your Local Health Department Not all health departments have doctors that can see patients for sick visits, but many do, so it is worth a call to see if yours does. If you don't know where your local health department is, you can check in your phone book. The CDC also has a tool to help you locate your state's health department, and many state websites also have listings of all of their local health departments.  3) Find a Walk-in Clinic If your illness is not likely to be very severe or complicated, you may want to try a walk in clinic. These are popping up all over the country in pharmacies, drugstores, and shopping centers. They're usually staffed by nurse practitioners or physician assistants that have been trained to treat common illnesses and complaints. They're usually fairly quick and inexpensive. However, if you have serious medical issues or chronic medical  problems, these are probably not your best option.  No Primary Care Doctor: - Call Health Connect at  [phone] - they can help you  locate a primary care doctor that  accepts your insurance, provides certain services, etc. - Physician Referral Service- [phone]  Chronic Pain Problems: Organization         Address  Phone   Notes  [facility]  [phone] Patients need to be referred by their primary care doctor.   Medication Assistance: Organization         Address  Phone   Notes  [facility] Medication [facility] [address] [place] [place], [place] [address] [phone] --Must be a resident of [facility] -- Must have NO insurance coverage whatsoever (no Medicaid/ Medicare, etc.) -- The pt. MUST have a primary care doctor that directs their care regularly and follows them in the community   [facility]  [phone]   [employer]  [phone]    Agencies that provide inexpensive medical care: Organization         Address  Phone   Notes  [facility]  [phone]   [facility]    [phone]   [facility] [address] [place], [place] [address] [phone]   [facility] [address] [place] [place] [phone]   Planned Parenthood    [phone]   [facility]    [phone]   [facility] and [facility]  [address], [place] Phone:  [phone], Fax:  [phone] Hours of Operation:  9 am - 6 pm, M-F.  Also accepts Medicaid/Medicare and self-pay.  [facility]  [address], [place] Phone: [phone], Fax: [phone]. Hours of Operation:  8:30 am - 5:30 pm, M-F.  Also accepts Medicaid and self-pay.  [facility] [place] [address] [place] Phone: [phone]   [facility] [address] Natasha Bence [place], [place] [phone], Ext. [phone] Mondays & Thursdays: 7-9 AM.  First 15 patients are seen on a first come, first serve basis.    Medicaid-accepting [facility]  Providers:  Organization         Address  Phone   Notes  [facility] [address], [place] [phone] Also accepts self-pay patients.  [facility] [address] Laurell Josephs [place], [place]  [phone]   [facility] [address], [place] [phone]   [facility] [address], [place] [phone]   Renaye Rakers [address], [place]   [phone] Only accepts [place] Access [place] patients after they have their name applied to their card.   Self-Pay (no insurance) in [facility]:  Organization         Address  Phone   Notes  Sickle Cell Patients, [facility] [address] [place], [place] [phone]   [facility] [address] [place], [place] [phone]   [facility] [place]  [address], [place] [phone]   [facility]/Dr. Osei-Bonsu  [address], [place] or [address], [place] [phone] Phone number for both [place] and [place] locations is the same.  [facility]gent Medical  and [facility] [address] [place] [phone]   [facility] [address] [place] or [address] [phone]   [facility] [address], [place] [phone], phone; [phone], fax Sees patients 1st and 3rd Saturday of every month.  Must not qualify for public or private insurance (i.e. Medicaid, Medicare, [place] Health Choice, Veterans' Benefits)  Household income should be no more than 200% of the poverty level The clinic cannot treat you if you are pregnant or think you are pregnant  Sexually transmitted diseases are not treated at the clinic.    Dental Care: Organization         Address  Phone  Notes  [facility] Department of [facility] [address] [place], [place] [phone] Accepts children up to age 29 who are enrolled in [place] or [place] Health Choice; pregnant women with a Medicaid card; and children who have applied for Medicaid or [place] Health Choice, but were declined, whose parents can pay a reduced fee at time of service.  [facility] Department of [facility]  [address], [place] [phone] Accepts children up to age 32 who are enrolled in [place] or [place] Health Choice; pregnant women with a Medicaid card; and children who have applied for Medicaid or [place] Health Choice, but were declined, whose parents can pay a reduced fee at time of service.  [facility]  [address] [place], [place] [phone] Patients are seen by appointment only. Walk-ins are not accepted. [facility] will see patients 69 years of age and older. Monday - Tuesday (8am-5pm) Most Wednesdays (8:30-5pm) $30 per visit, cash only  [facility] Adult Dental Access PROGRAM  [address], [facility] [phone] Patients are seen by appointment only. Walk-ins are not accepted. [facility] will see patients 66 years of age and older. One Wednesday Evening (Monthly: Volunteer Based).  $30 per visit, cash only  [employer]  [phone] for adults; Children under age 68, call Graduate Pediatric Dentistry at [phone]. Children aged 73-14, please call [phone] to request a pediatric application.  Dental services are provided in all areas of dental care including fillings, crowns and bridges, complete and partial dentures, implants, gum treatment, root canals, and extractions. Preventive care is also provided. Treatment is provided to both adults and children. Patients are selected via a lottery and there is often a waiting list.   [facility] [address] [place]  [phone] [email or web address]   [facility]  [address] [place], [place] [phone], Ext. [phone] Second and Fourth Thursday of each month, opens at 6:30 AM; Clinic ends at 9 AM.  Patients are seen on a first-come first-served basis, and a limited number are seen during each clinic.   [facility]  [address] Ether Griffins [place], [place] [phone]   Eligibility Requirements You must have lived in [place], [place], or [place] counties for at least the last three months.   You cannot be eligible for state or federal sponsored [place], including [employer], [place], or [employer].   You generally cannot be eligible for healthcare insurance through your employer.    How to apply: Eligibility screenings are held every Tuesday and Wednesday afternoon from 1:00 pm until 4:00 pm. You do not need an appointment for the interview!  [place]  [facility] [address], [place], [place] [phone]   [facility] Health Department  [phone]   [facility] Health Department  [phone]   [facility] Health Department  [phone]    Behavioral Health Resources in the Community: Intensive Outpatient Programs Organization         Address  Phone  Notes  [facility] [address] [place], [place] [phone]   [facility] Outpatient [address], [place], [place] [phone]   [facility] [address], [place], [place]  [phone]   [employer] [facility] [address]  [place], [place] [phone] or [phone]   Substance Abuse Resources Organization         Address  Phone  Notes  [facility]  [phone]   [facility]  [phone]   [facility] [place]  [phone]   Floydene Flock  [phone]   Residential & Outpatient Substance Abuse Program  [phone]   Psychological Services Organization         Address  Phone  Notes  [facility]  [phone]     [facility]  [phone]   [facility] Health [address], [place] [phone] or [phone]    Mobile Crisis Teams Organization         Address  Phone  Notes  [facility], Mobile Crisis Care Unit  [phone]   [facility]  [address] [place], [place] [phone]   Doristine Locks [address] [employer] [place] [phone]    Self-Help/Support Groups Organization         Address  Phone             Notes  [facility] [place] - variety of support groups  [phone]- [phone] Call for more information  Narcotics Anonymous (NA), [facility] [address] [employer] [place]  2 meetings at this location   Statistician         Address  Phone  Notes  [facility] [address],    [place]  [phone]   [facility]  [address], [place] [address], [place], [place] [phone]   [facility] Treatment [facility] [address] [place], [place] [phone] Admissions: 8am-3pm M-F  [facility] [address]    [place], [place] [phone]   [facility] [address] [employer], [place], [place] [phone]   [facility] [address],  [place], [place] [phone]   [facility] - Intensive Outpatient [address] Laurell Josephs [address], [place], [place] [phone]   [facility] ([facility] [address] [place].,  [place], [place] [phone] or [phone]   [facility] ([facility]) [address], [place], [place] [phone] Accepts Medicaid  [facility] [address]  [place] [phone] Substance Abuse/Addiction Treatment   [facility] Organization         Address  Phone  Notes  [facility]  [phone]   Angie Fava, PhD [address] Ervin Knack [place], [place]   [phone] or [phone]    [facility]   [address] [place], [place] [phone]   [facility] [address], [place], [place] [phone] Insurance/Medicaid/sponsorship through [employer] and Families [address], [address]  [place], [place] [phone] [place], [place] [phone]    Dr. Adele Schilder  [phone]   [facility] Dept. 1) [address] [place] 2) [place] 3)  [address], [place] [phone]  [phone]   [place] [phone] or [phone] (After Hours)

## 2015-06-17 NOTE — ED Notes (Signed)
Pt stable, ambulatory, states understanding of discharge instructions 

## 2016-01-28 ENCOUNTER — Emergency Department (HOSPITAL_COMMUNITY): Payer: Self-pay

## 2016-01-28 ENCOUNTER — Encounter (HOSPITAL_COMMUNITY): Payer: Self-pay | Admitting: Emergency Medicine

## 2016-01-28 ENCOUNTER — Telehealth (HOSPITAL_COMMUNITY): Payer: Self-pay

## 2016-01-28 ENCOUNTER — Emergency Department (HOSPITAL_COMMUNITY)
Admission: EM | Admit: 2016-01-28 | Discharge: 2016-01-28 | Disposition: A | Discharge status: Home / Self Care | Payer: Self-pay | Attending: Emergency Medicine | Admitting: Emergency Medicine

## 2016-01-28 DIAGNOSIS — K529 Noninfective gastroenteritis and colitis, unspecified: Secondary | ICD-10-CM | POA: Insufficient documentation

## 2016-01-28 DIAGNOSIS — Z791 Long term (current) use of non-steroidal anti-inflammatories (NSAID): Secondary | ICD-10-CM | POA: Insufficient documentation

## 2016-01-28 DIAGNOSIS — F17210 Nicotine dependence, cigarettes, uncomplicated: Secondary | ICD-10-CM | POA: Insufficient documentation

## 2016-01-28 LAB — CBC WITH DIFFERENTIAL/PLATELET
BASOS ABS: 0 10*3/uL (ref 0.0–0.1)
Basophils Relative: 0 %
EOS PCT: 1 %
Eosinophils Absolute: 0.2 10*3/uL (ref 0.0–0.7)
HCT: 41.5 % (ref 36.0–46.0)
Hemoglobin: 13.7 g/dL (ref 12.0–15.0)
LYMPHS ABS: 0.9 10*3/uL (ref 0.7–4.0)
LYMPHS PCT: 4 %
MCH: 29.8 pg (ref 26.0–34.0)
MCHC: 33 g/dL (ref 30.0–36.0)
MCV: 90.4 fL (ref 78.0–100.0)
MONO ABS: 1.7 10*3/uL — AB (ref 0.1–1.0)
Monocytes Relative: 8 %
Neutro Abs: 18.6 10*3/uL — ABNORMAL HIGH (ref 1.7–7.7)
Neutrophils Relative %: 87 %
PLATELETS: 179 10*3/uL (ref 150–400)
RBC: 4.59 MIL/uL (ref 3.87–5.11)
RDW: 13.1 % (ref 11.5–15.5)
WBC: 21.3 10*3/uL — ABNORMAL HIGH (ref 4.0–10.5)

## 2016-01-28 LAB — GASTROINTESTINAL PANEL BY PCR, STOOL (REPLACES STOOL CULTURE)
ADENOVIRUS F40/41: NOT DETECTED
ASTROVIRUS: NOT DETECTED
CYCLOSPORA CAYETANENSIS: NOT DETECTED
Campylobacter species: DETECTED — AB
Cryptosporidium: NOT DETECTED
E. coli O157: NOT DETECTED
ENTAMOEBA HISTOLYTICA: NOT DETECTED
ENTEROPATHOGENIC E COLI (EPEC): NOT DETECTED
ENTEROTOXIGENIC E COLI (ETEC): NOT DETECTED
Enteroaggregative E coli (EAEC): NOT DETECTED
Giardia lamblia: NOT DETECTED
Norovirus GI/GII: NOT DETECTED
Plesimonas shigelloides: NOT DETECTED
Rotavirus A: NOT DETECTED
SAPOVIRUS (I, II, IV, AND V): NOT DETECTED
Salmonella species: NOT DETECTED
Shiga like toxin producing E coli (STEC): NOT DETECTED
Shigella/Enteroinvasive E coli (EIEC): NOT DETECTED
VIBRIO CHOLERAE: NOT DETECTED
VIBRIO SPECIES: NOT DETECTED
YERSINIA ENTEROCOLITICA: NOT DETECTED

## 2016-01-28 LAB — URINALYSIS, ROUTINE W REFLEX MICROSCOPIC
BILIRUBIN URINE: NEGATIVE
Glucose, UA: NEGATIVE mg/dL
HGB URINE DIPSTICK: NEGATIVE
Ketones, ur: 15 mg/dL — AB
Nitrite: NEGATIVE
Protein, ur: NEGATIVE mg/dL
SPECIFIC GRAVITY, URINE: 1.023 (ref 1.005–1.030)
pH: 5.5 (ref 5.0–8.0)

## 2016-01-28 LAB — COMPREHENSIVE METABOLIC PANEL
ALT: 11 U/L — ABNORMAL LOW (ref 14–54)
ANION GAP: 7 (ref 5–15)
AST: 20 U/L (ref 15–41)
Albumin: 4.4 g/dL (ref 3.5–5.0)
Alkaline Phosphatase: 47 U/L (ref 38–126)
BUN: 6 mg/dL (ref 6–20)
CHLORIDE: 108 mmol/L (ref 101–111)
CO2: 24 mmol/L (ref 22–32)
Calcium: 9.3 mg/dL (ref 8.9–10.3)
Creatinine, Ser: 0.83 mg/dL (ref 0.44–1.00)
Glucose, Bld: 97 mg/dL (ref 65–99)
POTASSIUM: 3.5 mmol/L (ref 3.5–5.1)
Sodium: 139 mmol/L (ref 135–145)
Total Bilirubin: 0.4 mg/dL (ref 0.3–1.2)
Total Protein: 7.5 g/dL (ref 6.5–8.1)

## 2016-01-28 LAB — PREGNANCY, URINE: PREG TEST UR: NEGATIVE

## 2016-01-28 LAB — URINE MICROSCOPIC-ADD ON: RBC / HPF: NONE SEEN RBC/hpf (ref 0–5)

## 2016-01-28 LAB — LIPASE, BLOOD: LIPASE: 21 U/L (ref 11–51)

## 2016-01-28 MED ORDER — ONDANSETRON HCL 4 MG/2ML IJ SOLN
INTRAMUSCULAR | Status: AC
  Filled 2016-01-28: qty 2

## 2016-01-28 MED ORDER — HYDROCODONE-ACETAMINOPHEN 5-325 MG PO TABS: 2.0000 | ORAL_TABLET | ORAL | Status: DC | PRN

## 2016-01-28 MED ORDER — HYDROMORPHONE HCL 1 MG/ML IJ SOLN
1.0000 mg | Freq: Once | INTRAMUSCULAR | Status: AC
  Administered 2016-01-28: 1 mg via INTRAVENOUS
  Filled 2016-01-28: qty 1

## 2016-01-28 MED ORDER — IOPAMIDOL (ISOVUE-300) INJECTION 61%
INTRAVENOUS | Status: AC
  Administered 2016-01-28: 100 mL
  Filled 2016-01-28: qty 100

## 2016-01-28 MED ORDER — ONDANSETRON HCL 4 MG/2ML IJ SOLN
4.0000 mg | Freq: Once | INTRAMUSCULAR | Status: DC
  Filled 2016-01-28: qty 2

## 2016-01-28 MED ORDER — ONDANSETRON HCL 4 MG/2ML IJ SOLN
4.0000 mg | Freq: Once | INTRAMUSCULAR | Status: AC | PRN
  Administered 2016-01-28: 4 mg via INTRAVENOUS

## 2016-01-28 MED ORDER — CIPROFLOXACIN HCL 500 MG PO TABS: 500.0000 mg | ORAL_TABLET | Freq: Two times a day (BID) | ORAL | Status: DC

## 2016-01-28 MED ORDER — PROMETHAZINE HCL 25 MG PO TABS: 25.0000 mg | ORAL_TABLET | Freq: Four times a day (QID) | ORAL | Status: DC | PRN

## 2016-01-28 MED ORDER — METOCLOPRAMIDE HCL 5 MG/ML IJ SOLN
10.0000 mg | Freq: Once | INTRAMUSCULAR | Status: AC
  Administered 2016-01-28: 10 mg via INTRAVENOUS
  Filled 2016-01-28: qty 2

## 2016-01-28 MED ORDER — METRONIDAZOLE 500 MG PO TABS: 500.0000 mg | ORAL_TABLET | Freq: Two times a day (BID) | ORAL | Status: DC

## 2016-01-28 NOTE — ED Notes (Signed)
Pt up to br attempting to get stool sample

## 2016-01-28 NOTE — Telephone Encounter (Signed)
Called by ED CN K. Munnett who had rcvd call from lab w/critical on dcd pt.  (+) Campylobacter in stool.  Pt dcd on Cipro and Flagyl.    Chart reviewed by Santiago GladHeather Laisure PA-C "No further antibiotics needed.  Continue Cipro and Flagyl"  01/28/16 @ 2219 LVM requesting callback.

## 2016-01-28 NOTE — ED Notes (Signed)
Pt from home with c/o LLQ abd pain, vomitting and blood in stool x2 days. Presents with fever of 101F. Reports about 4 bloody stools since Wed. C/o 10/10 sharp shooting pain in LLQ. Unable to keep any food/drink down since Wed.

## 2016-01-28 NOTE — ED Provider Notes (Signed)
CSN: [id number]     Arrival date & time 01/28/16  0612 History   First MD Initiated Contact with Patient 01/28/16 [phone]     Chief Complaint  Patient presents with  . Abdominal Pain  . Emesis  . Rectal Bleeding     (Consider location/radiation/quality/duration/timing/severity/associated sxs/prior Treatment) Patient is a 35 y.o. female presenting with abdominal pain, vomiting, and hematochezia. The history is provided by the patient. No language interpreter was used.  Abdominal Pain Pain location:  LLQ Pain quality: aching and stabbing   Pain radiates to:  Does not radiate Pain severity:  Moderate Onset quality:  Gradual Duration:  2 days Timing:  Constant Progression:  Worsening Chronicity:  New Context: not sick contacts and not suspicious food intake   Relieved by:  Nothing Worsened by:  Nothing tried Ineffective treatments:  None tried Associated symptoms: hematochezia and vomiting   Risk factors: no alcohol abuse   Emesis Associated symptoms: abdominal pain   Rectal Bleeding Associated symptoms: abdominal pain and vomiting     Past Medical History  Diagnosis Date  . Bronchitis    Past Surgical History  Procedure Laterality Date  . Orif pelvic fracture     No family history on file. Social History  Substance Use Topics  . Smoking status: Current Every Day Smoker -- 0.00 packs/day    Types: Cigarettes  . Smokeless tobacco: None  . Alcohol Use: No   OB History    No data available     Review of Systems  Gastrointestinal: Positive for vomiting, abdominal pain and hematochezia.  All other systems reviewed and are negative.     Allergies  Review of patient's allergies indicates no known allergies.  Home Medications   Prior to Admission medications   Medication Sig Start Date End Date Taking? Authorizing Provider  ibuprofen (ADVIL,MOTRIN) 800 MG tablet Take 1 tablet (800 mg total) by mouth 3 (three) times daily. Patient not taking: Reported on 01/28/2016  06/17/15   Shawn C Joy, PA-C   BP 112/48 mmHg  Pulse 88  Temp(Src) 101 F (38.3 C) (Oral)  Resp 20  Ht 5\' 7"  (1.702 m)  Wt 56.7 kg  BMI 19.57 kg/m2  SpO2 97%  LMP  (LMP Unknown) Physical Exam  Constitutional: She appears well-developed and well-nourished.  HENT:  Head: Normocephalic.  Right Ear: External ear normal.  Left Ear: External ear normal.  Nose: Nose normal.  Mouth/Throat: Oropharynx is clear and moist.  Eyes: Conjunctivae are normal. Pupils are equal, round, and reactive to light.  Neck: Normal range of motion. Neck supple.  Cardiovascular: Normal rate.   Pulmonary/Chest: Effort normal.  Abdominal: Soft. There is tenderness. There is no rebound and no guarding.  Musculoskeletal: Normal range of motion.  Neurological: She is alert.  Skin: Skin is warm.  Nursing note and vitals reviewed.   ED Course  Procedures (including critical care time) Labs Review Labs Reviewed  URINALYSIS, ROUTINE W REFLEX MICROSCOPIC (NOT AT [facility]) - Abnormal; Notable for the following:    APPearance CLOUDY (*)    Ketones, ur 15 (*)    Leukocytes, UA SMALL (*)    All other components within normal limits  CBC WITH DIFFERENTIAL/PLATELET - Abnormal; Notable for the following:    WBC 21.3 (*)    Neutro Abs 18.6 (*)    Monocytes Absolute 1.7 (*)    All other components within normal limits  COMPREHENSIVE METABOLIC PANEL - Abnormal; Notable for the following:    ALT 11 (*)  All other components within normal limits  URINE MICROSCOPIC-ADD ON - Abnormal; Notable for the following:    Squamous Epithelial / LPF 6-30 (*)    Bacteria, UA MANY (*)    All other components within normal limits  GASTROINTESTINAL PANEL BY PCR, STOOL (REPLACES STOOL CULTURE)  PREGNANCY, URINE  LIPASE, BLOOD    Imaging Review Ct Abdomen Pelvis W Contrast  01/28/2016  CLINICAL DATA:  Left lower quadrant abdominal pain, 2 days duration, with blood in stool. EXAM: CT ABDOMEN AND PELVIS WITH CONTRAST TECHNIQUE:  Multidetector CT imaging of the abdomen and pelvis was performed using the standard protocol following bolus administration of intravenous contrast. CONTRAST:  100 cc Isovue 300 COMPARISON:  None. FINDINGS: Lung bases are clear.  No pleural or pericardial fluid. The liver has a normal appearance. There is mild focal fat adjacent to the falciform ligament, a frequent and insignificant finding. No calcified gallstones. The spleen is normal. The pancreas is normal. The adrenal glands are normal. The kidneys are normal. The aorta and IVC are normal. There is wall thickening affecting the entire colon consistent with diffuse colitis. This is most likely secondary to inflammatory bowel disease. Infectious colitis is possible. I do not identify any abnormal small intestine. There is free fluid in the pelvis, probably secondary to the colitis. Uterus and adnexal regions appear otherwise normal. There has been previous fusion surgery of the sacroiliac joints and symphysis pubis. There are chronic bilateral pars defects at L5 with 2 mm of anterolisthesis. IMPRESSION: Wall thickening diffusely affecting the colon, most consistent with inflammatory bowel disease. Infectious colitis is the differential diagnosis. Small amount of free fluid associated with that. Previous pelvic fusions. 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 Electronically Signed   By: Paulina Fusi M.D.   On: 01/28/2016 08:52   I have personally reviewed and evaluated these images and lab results as part of my medical decision-making.   EKG Interpretation None      MDM Pt feels better after medication.  Pt tolerating po fluids/   I will treat with flagyl and cipro.   Stool culture pending.   Phenergan for nausea.  Pt referred to [facility]. Pt counseled on symptoms that would indicate need for return.   Final diagnoses:  Colitis    An After Visit Summary was printed and given to the patient.No current facility-administered  medications for this encounter.  Current outpatient prescriptions:  .  ciprofloxacin (CIPRO) 500 MG tablet, Take 1 tablet (500 mg total) by mouth 2 (two) times daily., Disp: 20 tablet, Rfl: 0 .  HYDROcodone-acetaminophen (NORCO/VICODIN) 5-325 MG tablet, Take 2 tablets by mouth every 4 (four) hours as needed., Disp: 16 tablet, Rfl: 0 .  ibuprofen (ADVIL,MOTRIN) 800 MG tablet, Take 1 tablet (800 mg total) by mouth 3 (three) times daily. (Patient not taking: Reported on 01/28/2016), Disp: 21 tablet, Rfl: 0 .  metroNIDAZOLE (FLAGYL) 500 MG tablet, Take 1 tablet (500 mg total) by mouth 2 (two) times daily., Disp: 20 tablet, Rfl: 0 .  promethazine (PHENERGAN) 25 MG tablet, Take 1 tablet (25 mg total) by mouth every 6 (six) hours as needed for nausea or vomiting., Disp: 20 tablet, Rfl: 0    Elson Areas, PA-C 01/28/16 5784  Margarita Grizzle, MD 01/29/16 1510

## 2016-01-28 NOTE — Discharge Instructions (Signed)
°  Colitis °Colitis is inflammation of the colon. Colitis may last a short time (acute) or it may last a long time (chronic). °CAUSES °This condition may be caused by: °· Viruses. °· Bacteria. °· Reactions to medicine. °· Certain autoimmune diseases, such as Crohn disease or ulcerative colitis. °SYMPTOMS °Symptoms of this condition include: °· Diarrhea. °· Passing bloody or tarry stool. °· Pain. °· Fever. °· Vomiting. °· Tiredness (fatigue). °· Weight loss. °· Bloating. °· Sudden increase in abdominal pain. °· Having fewer bowel movements than usual. °DIAGNOSIS °This condition is diagnosed with a stool test or a blood test. You may also have other tests, including X-rays, a CT scan, or a colonoscopy. °TREATMENT °Treatment may include: °· Resting the bowel. This involves not eating or drinking for a period of time. °· Fluids that are given through an IV tube. °· Medicine for pain and diarrhea. °· Antibiotic medicines. °· Cortisone medicines. °· Surgery. °HOME CARE INSTRUCTIONS °Eating and Drinking °· Follow instructions from your health care provider about eating or drinking restrictions. °· Drink enough fluid to keep your urine clear or pale yellow. °· Work with a dietitian to determine which foods cause your condition to flare up. °· Avoid foods that cause flare-ups. °· Eat a well-balanced diet. °Medicines °· Take over-the-counter and prescription medicines only as told by your health care provider. °· If you were prescribed an antibiotic medicine, take it as told by your health care provider. Do not stop taking the antibiotic even if you start to feel better. °General Instructions °· Keep all follow-up visits as told by your health care provider. This is important. °SEEK MEDICAL CARE IF: °· Your symptoms do not go away. °· You develop new symptoms. °SEEK IMMEDIATE MEDICAL CARE IF: °· You have a fever that does not go away with treatment. °· You develop chills. °· You have extreme weakness, fainting, or  dehydration. °· You have repeated vomiting. °· You develop severe pain in your abdomen. °· You pass bloody or tarry stool. °  °This information is not intended to replace advice given to you by your health care provider. Make sure you discuss any questions you have with your health care provider. °  °Document Released: 09/21/2004 Document Revised: 05/05/2015 Document Reviewed: 12/07/2014 °Elsevier Interactive Patient Education ©2016 Elsevier Inc. ° °

## 2016-01-28 NOTE — ED Notes (Signed)
Pt given ginger ale and water 

## 2016-01-29 ENCOUNTER — Telehealth (HOSPITAL_BASED_OUTPATIENT_CLINIC_OR_DEPARTMENT_OTHER): Payer: Self-pay

## 2016-03-01 ENCOUNTER — Emergency Department (HOSPITAL_COMMUNITY)
Admission: EM | Admit: 2016-03-01 | Discharge: 2016-03-01 | Disposition: A | Discharge status: Home / Self Care | Payer: Self-pay | Attending: Emergency Medicine | Admitting: Emergency Medicine

## 2016-03-01 ENCOUNTER — Encounter (HOSPITAL_COMMUNITY): Payer: Self-pay | Admitting: Emergency Medicine

## 2016-03-01 DIAGNOSIS — H6501 Acute serous otitis media, right ear: Secondary | ICD-10-CM | POA: Insufficient documentation

## 2016-03-01 DIAGNOSIS — H6091 Unspecified otitis externa, right ear: Secondary | ICD-10-CM | POA: Insufficient documentation

## 2016-03-01 DIAGNOSIS — J029 Acute pharyngitis, unspecified: Secondary | ICD-10-CM | POA: Insufficient documentation

## 2016-03-01 DIAGNOSIS — F17210 Nicotine dependence, cigarettes, uncomplicated: Secondary | ICD-10-CM | POA: Insufficient documentation

## 2016-03-01 MED ORDER — NEOMYCIN-POLYMYXIN-HC 3.5-10000-1 OT SUSP
4.0000 [drp] | Freq: Three times a day (TID) | OTIC | Status: DC
  Administered 2016-03-01: 4 [drp] via OTIC
  Filled 2016-03-01: qty 10

## 2016-03-01 MED ORDER — IBUPROFEN 400 MG PO TABS
600.0000 mg | ORAL_TABLET | Freq: Once | ORAL | Status: AC
  Administered 2016-03-01: 600 mg via ORAL
  Filled 2016-03-01: qty 1

## 2016-03-01 MED ORDER — AMOXICILLIN 500 MG PO CAPS
500.0000 mg | ORAL_CAPSULE | Freq: Once | ORAL | Status: AC
  Administered 2016-03-01: 500 mg via ORAL
  Filled 2016-03-01: qty 1

## 2016-03-01 MED ORDER — AMOXICILLIN 500 MG PO CAPS: 500.0000 mg | ORAL_CAPSULE | Freq: Three times a day (TID) | ORAL | Status: DC

## 2016-03-01 MED ORDER — NAPROXEN 500 MG PO TABS: 500.0000 mg | ORAL_TABLET | Freq: Two times a day (BID) | ORAL | Status: DC

## 2016-03-01 MED ORDER — NEOMYCIN-COLIST-HC-THONZONIUM 3.3-3-10-0.5 MG/ML OT SUSP
4.0000 [drp] | Freq: Three times a day (TID) | OTIC | Status: DC
  Filled 2016-03-01: qty 5

## 2016-03-01 NOTE — Discharge Instructions (Signed)
Otitis Externa Otitis externa is a germ infection in the outer ear. The outer ear is the area from the eardrum to the outside of the ear. Otitis externa is sometimes called "swimmer's ear." HOME CARE  Put drops in the ear as told by your doctor.  Only take medicine as told by your doctor.  If you have diabetes, your doctor may give you more directions. Follow your doctor's directions.  Keep all doctor visits as told. To avoid another infection:  Keep your ear dry. Use the corner of a towel to dry your ear after swimming or bathing.  Avoid scratching or putting things inside your ear.  Avoid swimming in lakes, dirty water, or pools that use a chemical called chlorine poorly.  You may use ear drops after swimming. Combine equal amounts of white vinegar and alcohol in a bottle. Put 3 or 4 drops in each ear. GET HELP IF:   You have a fever.  Your ear is still red, puffy (swollen), or painful after 3 days.  You still have yellowish-white fluid (pus) coming from the ear after 3 days.  Your redness, puffiness, or pain gets worse.  You have a really bad headache.  You have redness, puffiness, pain, or tenderness behind your ear. MAKE SURE YOU:   Understand these instructions.  Will watch your condition.  Will get help right away if you are not doing well or get worse.   This information is not intended to replace advice given to you by your health care provider. Make sure you discuss any questions you have with your health care provider.   Document Released: 01/31/2008 Document Revised: 09/04/2014 Document Reviewed: 08/31/2011 Elsevier Interactive Patient Education 2016 [employer].  Otitis Media, Adult Otitis media is redness, soreness, and inflammation of the middle ear. Otitis media may be caused by allergies or, most commonly, by infection. Often it occurs as a complication of the common cold. SIGNS AND SYMPTOMS Symptoms of otitis media may  include:  Earache.  Fever.  Ringing in your ear.  Headache.  Leakage of fluid from the ear. DIAGNOSIS To diagnose otitis media, your health care provider will examine your ear with an otoscope. This is an instrument that allows your health care provider to see into your ear in order to examine your eardrum. Your health care provider also will ask you questions about your symptoms. TREATMENT  Typically, otitis media resolves on its own within 3-5 days. Your health care provider may prescribe medicine to ease your symptoms of pain. If otitis media does not resolve within 5 days or is recurrent, your health care provider may prescribe antibiotic medicines if he or she suspects that a bacterial infection is the cause. HOME CARE INSTRUCTIONS   If you were prescribed an antibiotic medicine, finish it all even if you start to feel better.  Take medicines only as directed by your health care provider.  Keep all follow-up visits as directed by your health care provider. SEEK MEDICAL CARE IF:  You have otitis media only in one ear, or bleeding from your nose, or both.  You notice a lump on your neck.  You are not getting better in 3-5 days.  You feel worse instead of better. SEEK IMMEDIATE MEDICAL CARE IF:   You have pain that is not controlled with medicine.  You have swelling, redness, or pain around your ear or stiffness in your neck.  You notice that part of your face is paralyzed.  You notice that the bone  behind your ear (mastoid) is tender when you touch it. MAKE SURE YOU:   Understand these instructions.  Will watch your condition.  Will get help right away if you are not doing well or get worse.   This information is not intended to replace advice given to you by your health care provider. Make sure you discuss any questions you have with your health care provider.   Document Released: 05/19/2004 Document Revised: 09/04/2014 Document Reviewed: 03/11/2013 Elsevier  Interactive Patient Education 2016 Elsevier Inc.  Pharyngitis Pharyngitis is a sore throat (pharynx). There is redness, pain, and swelling of your throat. HOME CARE   Drink enough fluids to keep your pee (urine) clear or pale yellow.  Only take medicine as told by your doctor.  You may get sick again if you do not take medicine as told. Finish your medicines, even if you start to feel better.  Do not take aspirin.  Rest.  Rinse your mouth (gargle) with salt water ( tsp of salt per 1 qt of water) every 1-2 hours. This will help the pain.  If you are not at risk for choking, you can suck on hard candy or sore throat lozenges. GET HELP IF:  You have large, tender lumps on your neck.  You have a rash.  You cough up green, yellow-brown, or bloody spit. GET HELP RIGHT AWAY IF:   You have a stiff neck.  You drool or cannot swallow liquids.  You throw up (vomit) or are not able to keep medicine or liquids down.  You have very bad pain that does not go away with medicine.  You have problems breathing (not from a stuffy nose). MAKE SURE YOU:   Understand these instructions.  Will watch your condition.  Will get help right away if you are not doing well or get worse.   This information is not intended to replace advice given to you by your health care provider. Make sure you discuss any questions you have with your health care provider.   Document Released: 01/31/2008 Document Revised: 06/04/2013 Document Reviewed: 04/21/2013 Elsevier Interactive Patient Education [employer]2016 Elsevier Inc.

## 2016-03-01 NOTE — ED Notes (Signed)
Pt. reports right ear ache for 2 days , denies injury , no drainage or hearing loss.

## 2016-03-01 NOTE — ED Provider Notes (Signed)
CSN: [id number]     Arrival date & time 03/01/16  2007 History  By signing my name below, I, Morgan Donaldson, attest that this documentation has been prepared under the direction and in the presence of [facility] Hospital[facility] Neese, NP-C. Electronically Signed: Phillis HaggisGabriella Donaldson, ED Scribe. 03/01/2016. 8:29 PM.    Chief Complaint  Patient presents with  . Otalgia   Patient is a 35 y.o. female presenting with ear pain. The history is provided by the patient. No language interpreter was used.  Otalgia Location:  Right Quality:  Aching Severity:  Mild Onset quality:  Gradual Duration:  2 days Timing:  Constant Progression:  Worsening Chronicity:  New Context: not direct blow   Ineffective treatments:  None tried Associated symptoms: congestion and sore throat   Associated symptoms: no cough, no ear discharge and no fever   HPI Comments: Aliene BeamsRobin Biehler is a 35 y.o. female who presents to the Emergency Department complaining of gradually worsening right otalgia onset 2 days ago. Pt reports associated chills, sore throat, and congestion. Pt has not tried anything for her symptoms. She denies direct blow to the ear, fever, ear drainage, hearing loss, or cough.   Past Medical History  Diagnosis Date  . Bronchitis    Past Surgical History  Procedure Laterality Date  . Orif pelvic fracture     No family history on file. Social History  Substance Use Topics  . Smoking status: Current Every Day Smoker -- 0.00 packs/day    Types: Cigarettes  . Smokeless tobacco: None  . Alcohol Use: No   OB History    No data available     Review of Systems  Constitutional: Positive for chills. Negative for fever.  HENT: Positive for congestion, ear pain and sore throat. Negative for ear discharge.   Respiratory: Negative for cough.    Allergies  Review of patient's allergies indicates no known allergies.  Home Medications   Prior to Admission medications   Medication Sig Start Date End Date Taking? Authorizing  Provider  amoxicillin (AMOXIL) 500 MG capsule Take 1 capsule (500 mg total) by mouth 3 (three) times daily. 03/01/16   [name], NP  ciprofloxacin (CIPRO) 500 MG tablet Take 1 tablet (500 mg total) by mouth 2 (two) times daily. 01/28/16   Elson AreasLeslie K Sofia, PA-C  HYDROcodone-acetaminophen (NORCO/VICODIN) 5-325 MG tablet Take 2 tablets by mouth every 4 (four) hours as needed. 01/28/16   Elson AreasLeslie K Sofia, PA-C  ibuprofen (ADVIL,MOTRIN) 800 MG tablet Take 1 tablet (800 mg total) by mouth 3 (three) times daily. Patient not taking: Reported on 01/28/2016 06/17/15   Shawn C Joy, PA-C  metroNIDAZOLE (FLAGYL) 500 MG tablet Take 1 tablet (500 mg total) by mouth 2 (two) times daily. 01/28/16   Elson AreasLeslie K Sofia, PA-C  naproxen (NAPROSYN) 500 MG tablet Take 1 tablet (500 mg total) by mouth 2 (two) times daily. 03/01/16   [name], NP  promethazine (PHENERGAN) 25 MG tablet Take 1 tablet (25 mg total) by mouth every 6 (six) hours as needed for nausea or vomiting. 01/28/16   Elson AreasLeslie K Sofia, PA-C   BP 118/71 mmHg  Pulse 86  Temp(Src) 98.7 F (37.1 C) (Oral)  Resp 16  Ht 5\' 7"  (1.702 m)  Wt 57.153 kg  BMI 19.73 kg/m2  SpO2 98%  LMP 02/15/2016 (Approximate) Physical Exam  Constitutional: She is oriented to person, place, and time. She appears well-developed and well-nourished. No distress.  HENT:  Head: Normocephalic and atraumatic.  Right Ear:  There is tenderness (ear canal). No mastoid tenderness. Tympanic membrane is erythematous.  Left Ear: Hearing, tympanic membrane, external ear and ear canal normal.  Mouth/Throat: Uvula is midline and mucous membranes are normal. Oropharyngeal exudate and posterior oropharyngeal erythema present. No posterior oropharyngeal edema.  Exudate to the right oropharynx; swelling to the right ear canal  Eyes: Conjunctivae and EOM are normal. Pupils are equal, round, and reactive to light.  Neck: Normal range of motion. Neck supple.  Cardiovascular: Normal rate and regular rhythm.    Pulmonary/Chest: Effort normal.  Musculoskeletal: Normal range of motion.  Neurological: She is alert and oriented to person, place, and time.  Skin: Skin is warm and dry.  Psychiatric: She has a normal mood and affect. Her behavior is normal.  Nursing note and vitals reviewed.   ED Course  Procedures (including critical care time) DIAGNOSTIC STUDIES: Oxygen Saturation is 98% on RA, normal by my interpretation.    COORDINATION OF CARE: 8:28 PM-Discussed treatment plan which includes ibuprofen and amoxicillin with pt at bedside and pt agreed to plan.    MDM   Patient presents with otalgia and exam consistent with acute otitis media. No concern for acute mastoiditis, meningitis.   No antibiotic use in the last month.  Patient discharged home with Amoxicillin. And Naprosyn.  Advised parents to call for follow-up.  I have also discussed reasons to return immediately to the ER.  Parent expresses understanding and agrees with plan. Cortisporin Otic Susp with first dose instilled to right ear prior to d/c. Patient will continue to use at home 4 drops to the right ear 4 times a day for the next 7 days.   Final diagnoses:  Otitis externa, right  Right acute serous otitis media, recurrence not specified  Exudative pharyngitis    I personally performed the services described in this documentation, which was scribed in my presence. The recorded information has been reviewed and is accurate.    [address]Hope LuckeyM Neese, [place]NP 03/02/16 0144  Lyndal Pulleyaniel Knott, MD 03/02/16 [id number]

## 2016-10-29 ENCOUNTER — Emergency Department (HOSPITAL_COMMUNITY)
Admission: EM | Admit: 2016-10-29 | Discharge: 2016-10-29 | Disposition: A | Discharge status: Home / Self Care | Payer: 59 | Attending: Emergency Medicine | Admitting: Emergency Medicine

## 2016-10-29 ENCOUNTER — Emergency Department (HOSPITAL_COMMUNITY): Payer: 59

## 2016-10-29 ENCOUNTER — Encounter (HOSPITAL_COMMUNITY): Payer: Self-pay

## 2016-10-29 DIAGNOSIS — F17210 Nicotine dependence, cigarettes, uncomplicated: Secondary | ICD-10-CM | POA: Insufficient documentation

## 2016-10-29 DIAGNOSIS — S0502XA Injury of conjunctiva and corneal abrasion without foreign body, left eye, initial encounter: Secondary | ICD-10-CM | POA: Insufficient documentation

## 2016-10-29 DIAGNOSIS — J069 Acute upper respiratory infection, unspecified: Secondary | ICD-10-CM | POA: Insufficient documentation

## 2016-10-29 DIAGNOSIS — Y929 Unspecified place or not applicable: Secondary | ICD-10-CM | POA: Insufficient documentation

## 2016-10-29 DIAGNOSIS — Y999 Unspecified external cause status: Secondary | ICD-10-CM | POA: Insufficient documentation

## 2016-10-29 DIAGNOSIS — Y9389 Activity, other specified: Secondary | ICD-10-CM | POA: Insufficient documentation

## 2016-10-29 DIAGNOSIS — W228XXA Striking against or struck by other objects, initial encounter: Secondary | ICD-10-CM | POA: Insufficient documentation

## 2016-10-29 MED ORDER — TETRACAINE HCL 0.5 % OP SOLN
1.0000 [drp] | Freq: Once | OPHTHALMIC | Status: AC
  Administered 2016-10-29: 1 [drp] via OPHTHALMIC
  Filled 2016-10-29: qty 2

## 2016-10-29 MED ORDER — BENZONATATE 100 MG PO CAPS: 100.0000 mg | ORAL_CAPSULE | Freq: Three times a day (TID) | ORAL | 0 refills | Status: DC | PRN

## 2016-10-29 MED ORDER — FLUORESCEIN SODIUM 0.6 MG OP STRP
1.0000 | ORAL_STRIP | Freq: Once | OPHTHALMIC | Status: AC
  Administered 2016-10-29: 1 via OPHTHALMIC
  Filled 2016-10-29: qty 1

## 2016-10-29 MED ORDER — ERYTHROMYCIN 5 MG/GM OP OINT
TOPICAL_OINTMENT | OPHTHALMIC | 0 refills | Status: DC
Start: 2016-10-29 — End: 2019-05-19

## 2016-10-29 NOTE — ED Notes (Signed)
Declined W/C at D/C and was escorted to lobby by RN. 

## 2016-10-29 NOTE — ED Triage Notes (Signed)
Patient complains of 1-2 weeks of cough, congestion. Also concerned that she may have pink eye. Left eye redness and drainage, denies trauma

## 2016-10-29 NOTE — Discharge Instructions (Signed)
[place] as needed for cough. Apply antibiotic ointment as directed. If symptoms do not improve in the next day or two, please call the ophthalmologist listed below. Return to ER for new or worsening symptoms, any additional concerns.  Dr. Genia DelMincey [address] [place], [place] [address] Local: [phone] Toll-free: [phone] Fax: [phone]

## 2016-10-29 NOTE — ED Provider Notes (Signed)
[facility]PT Provider Note   CSN: [id number] Arrival date & time: 10/29/16  1139  By signing my name below, I, Morgan Donaldson, attest that this documentation has been prepared under the direction and in the presence of [facility] PA-C.  Electronically Signed: Rosario Donaldson, ED Scribe. 10/29/16. 1:36 PM.  History   Chief Complaint Chief Complaint  Patient presents with  . cough, congestion   The history is provided by the patient. No language interpreter was used.    HPI Comments: Morgan Donaldson is a 36 y.o. female w/ a h/o bronchitis who presents to the Emergency Department complaining of persistent, gradually worsening productive cough w/ green sputum beginning 1-2 weeks ago. She notes associated congestion and chest soreness secondary to coughing only. She additionally notes that last night she woke up with her left eye draining and matted shut with some redness to the eye. Prior to the onset of her eye irritation, she states that while at work working with wood when she felt a small particulate enter her eye but this sensation resolved shortly following. Pt took decongestants and Tylenol without relief of her symptoms. Her husband has recently been sick with similar symptoms. No h/o asthma, COPD. She is a current, everyday smoker. Pt denies fever, or any other associated symptoms.   Past Medical History:  Diagnosis Date  . Bronchitis    There are no active problems to display for this patient.  Past Surgical History:  Procedure Laterality Date  . ORIF PELVIC FRACTURE     OB History    No data available     Home Medications    Prior to Admission medications   Medication Sig Start Date End Date Taking? Authorizing Provider  Throat Lozenges (COUGH DROPS MENTHOL MT) Use as directed 1-4 lozenges in the mouth or throat daily as needed. Cough / sore throat   Yes Historical Provider, MD  benzonatate (TESSALON) 100 MG capsule Take 1 capsule (100 mg total) by mouth 3  (three) times daily as needed for cough. 10/29/16   Chase Picket Aria Jarrard, PA-C  erythromycin ophthalmic ointment Place a 1/2 inch ribbon of ointment into the lower eyelid three times daily. 10/29/16   Chase Picket Shon Indelicato, PA-C  ibuprofen (ADVIL,MOTRIN) 800 MG tablet Take 1 tablet (800 mg total) by mouth 3 (three) times daily. Patient not taking: Reported on 01/28/2016 06/17/15   Shawn C Joy, PA-C  naproxen (NAPROSYN) 500 MG tablet Take 1 tablet (500 mg total) by mouth 2 (two) times daily. Patient not taking: Reported on 10/29/2016 03/01/16   Janne Napoleon, NP   Family History No family history on file.  Social History Social History  Substance Use Topics  . Smoking status: Current Every Day Smoker    Packs/day: 0.00    Types: Cigarettes  . Smokeless tobacco: Never Used  . Alcohol use No   Allergies   Patient has no known allergies.  Review of Systems Review of Systems  Constitutional: Negative for fever.  HENT: Positive for congestion.   Eyes: Positive for discharge, redness and itching.  Respiratory: Positive for cough.   Cardiovascular: Positive for chest pain (2/2 cough ).   Physical Exam Updated Vital Signs BP 111/67 (BP Location: Left Arm)   Pulse 78   Temp 98.2 F (36.8 C) (Oral)   Resp 20   SpO2 97%   Physical Exam  Constitutional: She is oriented to person, place, and time. She appears well-developed and well-nourished. No distress.  HENT:  Head: Normocephalic  and atraumatic.  OP with erythema, no exudates or tonsillar hypertrophy. + nasal congestion with mucosal edema.   Eyes: EOM and lids are normal. Pupils are equal, round, and reactive to light. Lids are everted and swept, no foreign bodies found. Right conjunctiva is injected. Left conjunctiva is not injected.    Neck: Normal range of motion. Neck supple.  No meningeal signs.   Cardiovascular: Normal rate, regular rhythm and normal heart sounds.   Pulmonary/Chest: Effort normal and breath sounds normal. No respiratory  distress. She has no wheezes. She has no rales.  Lungs are clear to auscultation bilaterally - no w/r/r  Abdominal: Soft. She exhibits no distension. There is no tenderness.  Musculoskeletal: Normal range of motion.  Lymphadenopathy:    She has cervical adenopathy.  Neurological: She is alert and oriented to person, place, and time.  Skin: Skin is warm and dry. She is not diaphoretic.  Nursing note and vitals reviewed.  ED Treatments / Results  DIAGNOSTIC STUDIES: Oxygen Saturation is 97% on RA, normal by my interpretation.   COORDINATION OF CARE: 1:35 PM-Discussed next steps with pt. Pt verbalized understanding and is agreeable with the plan.   Labs (all labs ordered are listed, but only abnormal results are displayed) Labs Reviewed - No data to display  EKG  EKG Interpretation  Date/Time:  Sunday October 29 2016 11:54:14 EST Ventricular Rate:  67 PR Interval:  150 QRS Duration: 78 QT Interval:  414 QTC Calculation: 437 R Axis:   82 Text Interpretation:  Normal sinus rhythm Low voltage QRS Septal infarct , age undetermined Abnormal ECG No old tracing to compare Confirmed by BELFI  MD, MELANIE ([id number]) on 10/29/2016 2:14:57 PM      Radiology Dg Chest 2 View  Result Date: 10/29/2016 CLINICAL DATA:  Cough and congestion. EXAM: CHEST  2 VIEW COMPARISON:  07/12/2014 FINDINGS: The heart size and mediastinal contours are within normal limits. There is no evidence of pulmonary edema, consolidation, pneumothorax, nodule or pleural fluid. The visualized skeletal structures are unremarkable. IMPRESSION: No active cardiopulmonary disease. Electronically Signed   By: Irish LackGlenn  Yamagata M.D.   On: 10/29/2016 13:21   Procedures Procedures  Medications Ordered in ED Medications  fluorescein ophthalmic strip 1 strip (1 strip Left Eye Given 10/29/16 1414)  tetracaine (PONTOCAINE) 0.5 % ophthalmic solution 1 drop (1 drop Left Eye Given 10/29/16 1414)    Initial Impression / Assessment and Plan /  ED Course  I have reviewed the triage vital signs and the nursing notes.  Pertinent labs & imaging results that were available during my care of the patient were reviewed by me and considered in my medical decision making (see chart for details).     Aliene BeamsRobin Weathersby is afebrile, non-toxic appearing with a clear lung exam. Mild rhinorrhea and OP with erythema, no exudates. CXR negative. Likely viral URI. Patient also complaining of left eye redness and discharge. Feels as if something is in her eye. Fluorescein stain consistent with corneal abrasion. Pain improved with tetracaine. No evidence of foreign body. Will place on erythromycin ointment and have her follow up with ophthalmology. Rx for tessalon given for cough. Return precautions discussed and all questions answered.   Blood pressure 111/67, pulse 78, temperature 98.2 F (36.8 C), temperature source Oral, resp. rate 20, SpO2 97 %.  Final Clinical Impressions(s) / ED Diagnoses   Final diagnoses:  Abrasion of left cornea, initial encounter  Upper respiratory tract infection, unspecified type   New Prescriptions Discharge Medication List as  of 10/29/2016  2:21 PM    START taking these medications   Details  benzonatate (TESSALON) 100 MG capsule Take 1 capsule (100 mg total) by mouth 3 (three) times daily as needed for cough., Starting Sun 10/29/2016, Print    erythromycin ophthalmic ointment Place a 1/2 inch ribbon of ointment into the lower eyelid three times daily., Print       I personally performed the services described in this documentation, which was scribed in my presence. The recorded information has been reviewed and is accurate.     [facility] Pawel Soules, PA-C 10/29/16 1459    Rolan Bucco, MD 10/29/16 [phone]

## 2016-12-26 ENCOUNTER — Encounter (HOSPITAL_COMMUNITY): Payer: Self-pay | Admitting: Emergency Medicine

## 2016-12-26 ENCOUNTER — Emergency Department (HOSPITAL_COMMUNITY)
Admission: EM | Admit: 2016-12-26 | Discharge: 2016-12-26 | Disposition: A | Discharge status: Home / Self Care | Payer: 59 | Attending: Physician Assistant | Admitting: Physician Assistant

## 2016-12-26 DIAGNOSIS — F17210 Nicotine dependence, cigarettes, uncomplicated: Secondary | ICD-10-CM | POA: Insufficient documentation

## 2016-12-26 DIAGNOSIS — K029 Dental caries, unspecified: Secondary | ICD-10-CM | POA: Insufficient documentation

## 2016-12-26 DIAGNOSIS — K0889 Other specified disorders of teeth and supporting structures: Secondary | ICD-10-CM | POA: Diagnosis present

## 2016-12-26 DIAGNOSIS — Z79899 Other long term (current) drug therapy: Secondary | ICD-10-CM | POA: Insufficient documentation

## 2016-12-26 MED ORDER — PENICILLIN V POTASSIUM 500 MG PO TABS: 500.0000 mg | ORAL_TABLET | Freq: Three times a day (TID) | ORAL | 0 refills | Status: DC

## 2016-12-26 MED ORDER — OXYCODONE-ACETAMINOPHEN 5-325 MG PO TABS
1.0000 | ORAL_TABLET | Freq: Once | ORAL | Status: AC
  Administered 2016-12-26: 1 via ORAL
  Filled 2016-12-26: qty 1

## 2016-12-26 MED ORDER — IBUPROFEN 800 MG PO TABS: 800.0000 mg | ORAL_TABLET | Freq: Three times a day (TID) | ORAL | 0 refills | Status: DC

## 2016-12-26 NOTE — ED Triage Notes (Signed)
Pt presents to ED with a two day history of dental pain pt has cavity to right upper mouth.

## 2016-12-26 NOTE — ED Notes (Signed)
c/o toothache onset 2 days ago, states her tooth has been broken for a while and it just started hurting.

## 2016-12-26 NOTE — Discharge Instructions (Signed)
Please follow up with a dentist. Please use this medication and return if you have any increase in swelling pain or other concerns.

## 2016-12-26 NOTE — ED Provider Notes (Addendum)
[facility] Provider Note   CSN: [id number] Arrival date & time: 12/26/16  1441  By signing my name below, I, Phillips Climes, attest that this documentation has been prepared under the direction and in the presence of Sherlyn Ebbert Randall An, MD.  Electronically Signed: Phillips Climes, Scribe. 12/26/2016. 3:14 PM.  History   Chief Complaint Chief Complaint  Patient presents with  . Dental Pain   Morgan Donaldson is a 36 y.o. female with a PMHx of jaw pain and tooth decay who presents to the Emergency Department with complaints of right sided dental pain x2 days, in the setting of breaking a tooth x1 month ago.   Pt denies experiencing any other acute sx, including nausea, vomiting, abdominal pain or fevers.   The history is provided by the patient and medical records. No language interpreter was used.   Past Medical History:  Diagnosis Date  . Bronchitis     There are no active problems to display for this patient.   Past Surgical History:  Procedure Laterality Date  . ORIF PELVIC FRACTURE      OB History    No data available     Home Medications    Prior to Admission medications   Medication Sig Start Date End Date Taking? Authorizing Provider  benzonatate (TESSALON) 100 MG capsule Take 1 capsule (100 mg total) by mouth 3 (three) times daily as needed for cough. 10/29/16   Chase Picket Ward, PA-C  erythromycin ophthalmic ointment Place a 1/2 inch ribbon of ointment into the lower eyelid three times daily. 10/29/16   Chase Picket Ward, PA-C  ibuprofen (ADVIL,MOTRIN) 800 MG tablet Take 1 tablet (800 mg total) by mouth 3 (three) times daily. Patient not taking: Reported on 01/28/2016 06/17/15   Shawn C Joy, PA-C  naproxen (NAPROSYN) 500 MG tablet Take 1 tablet (500 mg total) by mouth 2 (two) times daily. Patient not taking: Reported on 10/29/2016 03/01/16   Janne Napoleon, NP  Throat Lozenges (COUGH DROPS MENTHOL MT) Use as directed 1-4 lozenges in the mouth or throat daily as  needed. Cough / sore throat    Historical Provider, MD   Family History No family history on file.  Social History Social History  Substance Use Topics  . Smoking status: Current Every Day Smoker    Packs/day: 0.00    Types: Cigarettes  . Smokeless tobacco: Never Used  . Alcohol use No   Allergies   Patient has no known allergies.  Review of Systems Review of Systems  Constitutional: Negative for fever.  HENT: Positive for dental problem.   Gastrointestinal: Negative for abdominal pain, nausea and vomiting.    Physical Exam Updated Vital Signs BP 132/76 (BP Location: Left Arm)   Pulse 61   Temp 99.2 F (37.3 C) (Oral)   Resp 19   SpO2 98%   Physical Exam  Constitutional: She is oriented to person, place, and time. She appears well-developed and well-nourished. No distress.  HENT:  Head: Normocephalic and atraumatic.  No drainable abscess. Mild erythema. Broken tooth  Cardiovascular: Normal rate.   Pulmonary/Chest: Effort normal.  Neurological: She is alert and oriented to person, place, and time.  Skin: Skin is warm and dry.  Psychiatric: She has a normal mood and affect.  Nursing note and vitals reviewed.  ED Treatments / Results  DIAGNOSTIC STUDIES: Oxygen Saturation is 98% on RA, nl by my interpretation.    COORDINATION OF CARE: 3:12 PM Discussed treatment plan with pt at bedside.  Pt is pleasant and agreed to plan.  Labs (all labs ordered are listed, but only abnormal results are displayed) Labs Reviewed - No data to display  EKG  EKG Interpretation None      Radiology No results found.  Procedures Procedures (including critical care time)  Medications Ordered in ED Medications - No data to display   Initial Impression / Assessment and Plan / ED Course  I have reviewed the triage vital signs and the nursing notes.  Pertinent labs & imaging results that were available during my care of the patient were reviewed by me and considered in my  medical decision making (see chart for details).     I personally performed the services described in this documentation, which was scribed in my presence. The recorded information has been reviewed and is accurate.   No evidence of drainable abscess. Mild erythema to the gum. Will treat as dental infection give pain medication, follow up with dentist. Patient with evidence of a cracked tooth.  Final Clinical Impressions(s) / ED Diagnoses   Final diagnoses:  None    New Prescriptions New Prescriptions   No medications on file     Caylen Yardley Randall An, MD 12/26/16 1519    Bryanne Riquelme Lyn Jeffrey Graefe, MD 12/26/16 1520

## 2017-05-11 ENCOUNTER — Emergency Department (HOSPITAL_COMMUNITY): Payer: PRIVATE HEALTH INSURANCE

## 2017-05-11 ENCOUNTER — Emergency Department (HOSPITAL_COMMUNITY)
Admission: EM | Admit: 2017-05-11 | Discharge: 2017-05-11 | Disposition: A | Discharge status: Home / Self Care | Payer: PRIVATE HEALTH INSURANCE | Attending: Emergency Medicine | Admitting: Emergency Medicine

## 2017-05-11 DIAGNOSIS — Z791 Long term (current) use of non-steroidal anti-inflammatories (NSAID): Secondary | ICD-10-CM | POA: Insufficient documentation

## 2017-05-11 DIAGNOSIS — W260XXA Contact with knife, initial encounter: Secondary | ICD-10-CM | POA: Insufficient documentation

## 2017-05-11 DIAGNOSIS — F17210 Nicotine dependence, cigarettes, uncomplicated: Secondary | ICD-10-CM | POA: Insufficient documentation

## 2017-05-11 DIAGNOSIS — Y929 Unspecified place or not applicable: Secondary | ICD-10-CM | POA: Insufficient documentation

## 2017-05-11 DIAGNOSIS — S61211A Laceration without foreign body of left index finger without damage to nail, initial encounter: Secondary | ICD-10-CM | POA: Insufficient documentation

## 2017-05-11 DIAGNOSIS — Z79899 Other long term (current) drug therapy: Secondary | ICD-10-CM | POA: Insufficient documentation

## 2017-05-11 DIAGNOSIS — Y999 Unspecified external cause status: Secondary | ICD-10-CM | POA: Insufficient documentation

## 2017-05-11 DIAGNOSIS — Y9389 Activity, other specified: Secondary | ICD-10-CM | POA: Insufficient documentation

## 2017-05-11 NOTE — ED Notes (Signed)
Pt soaking finger.  °

## 2017-05-11 NOTE — ED Provider Notes (Signed)
[facility] Provider Note   CSN: [id number] Arrival date & time: 05/11/17  1153     History   Chief Complaint Chief Complaint  Patient presents with  . Laceration    HPI Morgan Donaldson is a 36 y.o. female who presents to the emergency department with a chief complaint of left index finger laceration. She reports that she was cutting an onion with a knife last night around 7 PM when she lost control of the knife and cut her finger. She reports she applied a Band-Aid, but has bled through the bandage several times at home so she came to the emergency department for evaluation. She denies cutting raw meat with a knife. She reports numbness to the tip of her finger. No weakness. No previous surgeries or injuries. Her tetanus was last updated in 2010.  The history is provided by the patient. No language interpreter was used.    Past Medical History:  Diagnosis Date  . Bronchitis     There are no active problems to display for this patient.   Past Surgical History:  Procedure Laterality Date  . ORIF PELVIC FRACTURE      OB History    No data available       Home Medications    Prior to Admission medications   Medication Sig Start Date End Date Taking? Authorizing Provider  benzonatate (TESSALON) 100 MG capsule Take 1 capsule (100 mg total) by mouth 3 (three) times daily as needed for cough. 10/29/16   Ward, Chase Picket, PA-C  erythromycin ophthalmic ointment Place a 1/2 inch ribbon of ointment into the lower eyelid three times daily. 10/29/16   Ward, Chase Picket, PA-C  ibuprofen (ADVIL,MOTRIN) 800 MG tablet Take 1 tablet (800 mg total) by mouth 3 (three) times daily. Patient not taking: Reported on 01/28/2016 06/17/15   Anselm Pancoast, PA-C  ibuprofen (ADVIL,MOTRIN) 800 MG tablet Take 1 tablet (800 mg total) by mouth 3 (three) times daily. 12/26/16   Mackuen, Courteney Lyn, MD  naproxen (NAPROSYN) 500 MG tablet Take 1 tablet (500 mg total) by mouth 2 (two) times  daily. Patient not taking: Reported on 10/29/2016 03/01/16   Janne Napoleon, NP  penicillin v potassium (VEETID) 500 MG tablet Take 1 tablet (500 mg total) by mouth 3 (three) times daily. 12/26/16   Mackuen, Courteney Lyn, MD  Throat Lozenges (COUGH DROPS MENTHOL MT) Use as directed 1-4 lozenges in the mouth or throat daily as needed. Cough / sore throat    [provider]    Family History No family history on file.  Social History Social History  Substance Use Topics  . Smoking status: Current Every Day Smoker    Packs/day: 0.00    Types: Cigarettes  . Smokeless tobacco: Never Used  . Alcohol use No     Allergies   Patient has no known allergies.   Review of Systems Review of Systems  Constitutional: Negative for activity change.  Respiratory: Negative for shortness of breath.   Cardiovascular: Negative for chest pain.  Gastrointestinal: Negative for abdominal pain.  Musculoskeletal: Negative for back pain.  Skin: Positive for wound. Negative for rash.     Physical Exam Updated Vital Signs BP 121/80   Pulse 78   Temp 98.4 F (36.9 C) (Oral)   Resp 18   LMP 05/11/2017   SpO2 100%   Physical Exam  Constitutional: No distress.  HENT:  Head: Normocephalic.  Eyes: Conjunctivae are normal.  Neck: Neck supple.  Cardiovascular:  Normal rate and regular rhythm.  Exam reveals no gallop and no friction rub.   No murmur heard. Pulmonary/Chest: Effort normal. No respiratory distress.  Abdominal: Soft. She exhibits no distension.  Neurological: She is alert.  Skin: Skin is warm. No rash noted.  There is a 1.5 cm vertical laceration to the left index finger that extends from the tip of the finger to the DIP joint. Good strength against resistance of the finger. Radial pulse is 2+. Good perfusion to the fingertip. The patient has decreased sensation to the palmar aspect and ulnar-lateral aspect of the digit. Sensation is intact to the medio-radial and dorsum of the  fingertip.   Psychiatric: Her behavior is normal.  Nursing note and vitals reviewed.    ED Treatments / Results  Labs (all labs ordered are listed, but only abnormal results are displayed) Labs Reviewed - No data to display  EKG  EKG Interpretation None       Radiology Dg Finger Index Left  Result Date: 05/11/2017 CLINICAL DATA:  36 year old female status post laceration to the distal finger yesterday. EXAM: LEFT INDEX FINGER 2+V COMPARISON:  None. FINDINGS: There is no evidence of fracture or dislocation. There is no evidence of arthropathy or other focal bone abnormality. Soft tissues are unremarkable. IMPRESSION: Negative. Electronically Signed   By: Sande Brothers M.D.   On: 05/11/2017 14:34    Procedures Procedures (including critical care time)  Medications Ordered in ED Medications - No data to display   Initial Impression / Assessment and Plan / ED Course  I have reviewed the triage vital signs and the nursing notes.  Pertinent labs & imaging results that were available during my care of the patient were reviewed by me and considered in my medical decision making (see chart for details).     36 year old female with a laceration to the left index finger that occurred 20 hours ago. X-ray negative for underlying fracture. Decreased sensation noted over the lateral aspect of the fingertip. Spoke with Charma Igo, ortho PA, who recommended no follow-up since the patient has good strength of the finger and suggested sensation would return as the fingertip heals. The wound was copiously irrigated and soaked in Betadine and the emergency department. Steri-Strips, a gauze bandage, and a splint were applied. Strict return precautions given. No acute distress. The patient is safe for discharge at this time.  Final Clinical Impressions(s) / ED Diagnoses   Final diagnoses:  Laceration of left index finger without foreign body without damage to nail, initial encounter     New Prescriptions New Prescriptions   No medications on file     Barkley Boards, PA-C 05/11/17 1458    Rolan Bucco, MD 05/11/17 1526

## 2017-05-11 NOTE — Discharge Instructions (Signed)
Please keep the finger clean and dry for the first 12 hours. Please wear your splint to avoid reopening the cut. After the first 12 hours, you can clean the area with warm soap and water. You can replace the Steri-Strips if they stop sticking to the skin. Then you can apply a topical antibiotic like bacitracin and reapply a clean gauze bandage. After the first day, you can clean the wound with warm soap and water at least once daily.  If you develop a fever, chills, or if the finger gets red, hot, or swollen, please return to the emergency department for reevaluation.  You may take 800 mg of ibuprofen with food every 8 hours or 650 mg of Tylenol every 6 hours for pain control.  The sensation in the tip of her finger should return as the wound heals.

## 2017-05-11 NOTE — ED Triage Notes (Signed)
Pt states laceration to left index finger with a knife yesterday around 7pm. No active bleeding noted. Pt has changed band aid several times. Last tetanus shot 2010.

## 2017-07-02 ENCOUNTER — Other Ambulatory Visit: Payer: Self-pay

## 2017-07-02 ENCOUNTER — Emergency Department (HOSPITAL_COMMUNITY)
Admission: EM | Admit: 2017-07-02 | Discharge: 2017-07-03 | Disposition: A | Discharge status: Home / Self Care | Payer: PRIVATE HEALTH INSURANCE | Attending: Emergency Medicine | Admitting: Emergency Medicine

## 2017-07-02 ENCOUNTER — Encounter (HOSPITAL_COMMUNITY): Payer: Self-pay

## 2017-07-02 DIAGNOSIS — K529 Noninfective gastroenteritis and colitis, unspecified: Secondary | ICD-10-CM

## 2017-07-02 DIAGNOSIS — Z791 Long term (current) use of non-steroidal anti-inflammatories (NSAID): Secondary | ICD-10-CM | POA: Insufficient documentation

## 2017-07-02 DIAGNOSIS — F17210 Nicotine dependence, cigarettes, uncomplicated: Secondary | ICD-10-CM | POA: Insufficient documentation

## 2017-07-02 LAB — I-STAT BETA HCG BLOOD, ED (MC, WL, AP ONLY): I-stat hCG, quantitative: 8.3 m[IU]/mL — ABNORMAL HIGH (ref <5)

## 2017-07-02 LAB — URINALYSIS, ROUTINE W REFLEX MICROSCOPIC
Bilirubin Urine: NEGATIVE
GLUCOSE, UA: NEGATIVE mg/dL
Hgb urine dipstick: NEGATIVE
Ketones, ur: NEGATIVE mg/dL
LEUKOCYTES UA: NEGATIVE
NITRITE: NEGATIVE
PH: 7 (ref 5.0–8.0)
Protein, ur: NEGATIVE mg/dL
SPECIFIC GRAVITY, URINE: 1.018 (ref 1.005–1.030)

## 2017-07-02 LAB — CBC
HEMATOCRIT: 39 % (ref 36.0–46.0)
HEMOGLOBIN: 12.9 g/dL (ref 12.0–15.0)
MCH: 30.5 pg (ref 26.0–34.0)
MCHC: 33.1 g/dL (ref 30.0–36.0)
MCV: 92.2 fL (ref 78.0–100.0)
Platelets: 207 10*3/uL (ref 150–400)
RBC: 4.23 MIL/uL (ref 3.87–5.11)
RDW: 13.1 % (ref 11.5–15.5)
WBC: 9.5 10*3/uL (ref 4.0–10.5)

## 2017-07-02 LAB — COMPREHENSIVE METABOLIC PANEL
ALBUMIN: 4.2 g/dL (ref 3.5–5.0)
ALT: 12 U/L — ABNORMAL LOW (ref 14–54)
ANION GAP: 6 (ref 5–15)
AST: 19 U/L (ref 15–41)
Alkaline Phosphatase: 34 U/L — ABNORMAL LOW (ref 38–126)
BILIRUBIN TOTAL: 0.5 mg/dL (ref 0.3–1.2)
BUN: 7 mg/dL (ref 6–20)
CHLORIDE: 109 mmol/L (ref 101–111)
CO2: 23 mmol/L (ref 22–32)
Calcium: 9.1 mg/dL (ref 8.9–10.3)
Creatinine, Ser: 0.72 mg/dL (ref 0.44–1.00)
GFR calc Af Amer: >60 mL/min (ref >60)
Glucose, Bld: 83 mg/dL (ref 65–99)
POTASSIUM: 4.1 mmol/L (ref 3.5–5.1)
Sodium: 138 mmol/L (ref 135–145)
TOTAL PROTEIN: 6.8 g/dL (ref 6.5–8.1)

## 2017-07-02 LAB — LIPASE, BLOOD: LIPASE: 30 U/L (ref 11–51)

## 2017-07-02 MED ORDER — ONDANSETRON HCL 4 MG/2ML IJ SOLN
4.0000 mg | Freq: Once | INTRAMUSCULAR | Status: AC
  Administered 2017-07-02: 4 mg via INTRAVENOUS
  Filled 2017-07-02: qty 2

## 2017-07-02 MED ORDER — LOPERAMIDE HCL 2 MG PO CAPS
4.0000 mg | ORAL_CAPSULE | Freq: Once | ORAL | Status: AC
  Administered 2017-07-02: 4 mg via ORAL
  Filled 2017-07-02: qty 2

## 2017-07-02 NOTE — ED Notes (Signed)
Called pt for triage no answer 

## 2017-07-02 NOTE — ED Notes (Signed)
Patient has returned. 

## 2017-07-02 NOTE — ED Provider Notes (Signed)
[facility]   CSN: [id number] Arrival date & time: 07/02/17  1617     History   Chief Complaint Chief Complaint  Patient presents with  . Emesis    HPI Morgan Donaldson is a 36 y.o. female.  HPI  This is a 36 year old female who presents with nausea, vomiting, abdominal pain.  Patient reports onset of symptoms at 3 AM today.  She has had multiple episodes of nonbilious, nonbloody emesis, nonbloody diarrhea, and crampy abdominal pain.  No known sick contacts.  She reports chills without fevers.  She denies any significant pain but states that her abdomen is more "discomfort."  She rates it at a 5 out of 10.  She took Pepto-Bismol with minimal relief.  Denies any cough, chest pain, shortness of breath.  She states that she is currently on her menstrual period.  Past Medical History:  Diagnosis Date  . Bronchitis     There are no active problems to display for this patient.   Past Surgical History:  Procedure Laterality Date  . ORIF PELVIC FRACTURE      OB History    No data available       Home Medications    Prior to Admission medications   Medication Sig Start Date End Date Taking? Authorizing Provider  benzonatate (TESSALON) 100 MG capsule Take 1 capsule (100 mg total) by mouth 3 (three) times daily as needed for cough. 10/29/16   Ward, Chase PicketJaime Pilcher, PA-C  erythromycin ophthalmic ointment Place a 1/2 inch ribbon of ointment into the lower eyelid three times daily. 10/29/16   Ward, Chase PicketJaime Pilcher, PA-C  ibuprofen (ADVIL,MOTRIN) 800 MG tablet Take 1 tablet (800 mg total) by mouth 3 (three) times daily. Patient not taking: Reported on 01/28/2016 06/17/15   Anselm PancoastJoy, Shawn C, PA-C  ibuprofen (ADVIL,MOTRIN) 800 MG tablet Take 1 tablet (800 mg total) by mouth 3 (three) times daily. 12/26/16   Mackuen, Courteney Lyn, MD  loperamide (IMODIUM) 2 MG capsule Take 1 capsule (2 mg total) 4 (four) times daily as needed by mouth for diarrhea or  loose stools. 07/03/17   Kimyah Frein, Mayer Maskerourtney F, MD  naproxen (NAPROSYN) 500 MG tablet Take 1 tablet (500 mg total) by mouth 2 (two) times daily. Patient not taking: Reported on 10/29/2016 03/01/16   Janne NapoleonNeese, Hope M, NP  ondansetron (ZOFRAN ODT) 4 MG disintegrating tablet Take 1 tablet (4 mg total) every 8 (eight) hours as needed by mouth for nausea or vomiting. 07/03/17   Michiah Mudry, Mayer Maskerourtney F, MD  penicillin v potassium (VEETID) 500 MG tablet Take 1 tablet (500 mg total) by mouth 3 (three) times daily. 12/26/16   Mackuen, Courteney Lyn, MD  Throat Lozenges (COUGH DROPS MENTHOL MT) Use as directed 1-4 lozenges in the mouth or throat daily as needed. Cough / sore throat    [provider]    Family History History reviewed. No pertinent family history.  Social History Social History   Tobacco Use  . Smoking status: Current Every Day Smoker    Packs/day: 0.00    Types: Cigarettes  . Smokeless tobacco: Never Used  Substance Use Topics  . Alcohol use: No  . Drug use: Yes    Types: Marijuana     Allergies   Patient has no known allergies.   Review of Systems Review of Systems  Constitutional: Negative for fever.  Respiratory: Negative for cough and shortness of breath.   Cardiovascular: Negative for chest pain.  Gastrointestinal: Positive for  abdominal pain, diarrhea, nausea and vomiting.  Genitourinary: Negative for dysuria.  All other systems reviewed and are negative.    Physical Exam Updated Vital Signs BP 117/68 (BP Location: Right Arm)   Pulse 63   Temp 98.6 F (37 C) (Oral)   Resp 16   LMP 06/29/2017 (Exact Date)   SpO2 100%   Physical Exam  Constitutional: She is oriented to person, place, and time. She appears well-developed and well-nourished. No distress.  HENT:  Head: Normocephalic and atraumatic.  Cardiovascular: Normal rate, regular rhythm and normal heart sounds.  No murmur heard. Pulmonary/Chest: Effort normal and breath sounds normal. No respiratory  distress. She has no wheezes.  Abdominal: Soft. Bowel sounds are normal. There is no tenderness. There is no guarding.  Neurological: She is alert and oriented to person, place, and time.  Skin: Skin is warm and dry.  Psychiatric: She has a normal mood and affect.  Nursing note and vitals reviewed.    ED Treatments / Results  Labs (all labs ordered are listed, but only abnormal results are displayed) Labs Reviewed  COMPREHENSIVE METABOLIC PANEL - Abnormal; Notable for the following components:      Result Value   ALT 12 (*)    Alkaline Phosphatase 34 (*)    All other components within normal limits  I-STAT BETA HCG BLOOD, ED ([facility], [facility], [facility] ONLY) - Abnormal; Notable for the following components:   I-stat hCG, quantitative 8.3 (*)    All other components within normal limits  LIPASE, BLOOD  CBC  URINALYSIS, ROUTINE W REFLEX MICROSCOPIC  HCG, QUANTITATIVE, PREGNANCY    EKG  EKG Interpretation None       Radiology No results found.  Procedures Procedures (including critical care time)  Medications Ordered in ED Medications  ondansetron (ZOFRAN) injection 4 mg (4 mg Intravenous Given 07/02/17 2355)  loperamide (IMODIUM) capsule 4 mg (4 mg Oral Given 07/02/17 2355)     Initial Impression / Assessment and Plan / ED Course  I have reviewed the triage vital signs and the nursing notes.  Pertinent labs & imaging results that were available during my care of the patient were reviewed by me and considered in my medical decision making (see chart for details).     Vomiting, diarrhea.  Nontoxic appearing on exam.  Abdominal exam is benign.  She does appear somewhat dry.  She is given fluids, Imodium, Zofran.  Suspect viral etiology.  Lab work obtained from triage and reviewed.  This is largely reassuring.  I-STAT beta hCG is 8.  Likely a false positive.  Repeat beta in the lab is negative.  Patient is able to tolerate fluids and stay hydrated on her own following supportive  measures.  Recommend continued supportive measures at home.  After history, exam, and medical workup I feel the patient has been appropriately medically screened and is safe for discharge home. Pertinent diagnoses were discussed with the patient. Patient was given return precautions.   Final Clinical Impressions(s) / ED Diagnoses   Final diagnoses:  Gastroenteritis    ED Discharge Orders        Ordered    loperamide (IMODIUM) 2 MG capsule  4 times daily PRN     07/03/17 0059    ondansetron (ZOFRAN ODT) 4 MG disintegrating tablet  Every 8 hours PRN     07/03/17 0059       Shon Baton, MD 07/03/17 0134

## 2017-07-02 NOTE — ED Notes (Signed)
Pt called,no answer.

## 2017-07-02 NOTE — ED Triage Notes (Signed)
Pt states she has had vomiting all day. Pt states unable to keep anything down. She reports mild headache as well and some abdominal cramping. Vitals stable. Skin warm and dry.

## 2017-07-03 LAB — HCG, QUANTITATIVE, PREGNANCY: hCG, Beta Chain, Quant, S: <1 m[IU]/mL (ref <5)

## 2017-07-03 MED ORDER — LOPERAMIDE HCL 2 MG PO CAPS: 2.0000 mg | ORAL_CAPSULE | Freq: Four times a day (QID) | ORAL | 0 refills | Status: DC | PRN

## 2017-07-03 MED ORDER — ONDANSETRON 4 MG PO TBDP: 4.0000 mg | ORAL_TABLET | Freq: Three times a day (TID) | ORAL | 0 refills | Status: DC | PRN

## 2018-05-20 ENCOUNTER — Emergency Department (HOSPITAL_COMMUNITY): Payer: Self-pay

## 2018-05-20 ENCOUNTER — Other Ambulatory Visit: Payer: Self-pay

## 2018-05-20 ENCOUNTER — Emergency Department (HOSPITAL_COMMUNITY)
Admission: EM | Admit: 2018-05-20 | Discharge: 2018-05-20 | Disposition: A | Discharge status: Home / Self Care | Payer: Self-pay | Attending: Emergency Medicine | Admitting: Emergency Medicine

## 2018-05-20 ENCOUNTER — Encounter (HOSPITAL_COMMUNITY): Payer: Self-pay | Admitting: *Deleted

## 2018-05-20 DIAGNOSIS — J40 Bronchitis, not specified as acute or chronic: Secondary | ICD-10-CM

## 2018-05-20 DIAGNOSIS — F17210 Nicotine dependence, cigarettes, uncomplicated: Secondary | ICD-10-CM | POA: Insufficient documentation

## 2018-05-20 DIAGNOSIS — J209 Acute bronchitis, unspecified: Secondary | ICD-10-CM | POA: Insufficient documentation

## 2018-05-20 DIAGNOSIS — Z79899 Other long term (current) drug therapy: Secondary | ICD-10-CM | POA: Insufficient documentation

## 2018-05-20 MED ORDER — ALBUTEROL SULFATE (2.5 MG/3ML) 0.083% IN NEBU
5.0000 mg | INHALATION_SOLUTION | Freq: Once | RESPIRATORY_TRACT | Status: AC
  Administered 2018-05-20: 5 mg via RESPIRATORY_TRACT
  Filled 2018-05-20: qty 6

## 2018-05-20 MED ORDER — ALBUTEROL SULFATE HFA 108 (90 BASE) MCG/ACT IN AERS
1.0000 | INHALATION_SPRAY | Freq: Once | RESPIRATORY_TRACT | Status: AC
  Administered 2018-05-20: 2 via RESPIRATORY_TRACT

## 2018-05-20 MED ORDER — AZITHROMYCIN 250 MG PO TABS: 250.0000 mg | ORAL_TABLET | Freq: Every day | ORAL | 0 refills | Status: DC

## 2018-05-20 MED ORDER — ALBUTEROL SULFATE HFA 108 (90 BASE) MCG/ACT IN AERS: 1.0000 | INHALATION_SPRAY | Freq: Four times a day (QID) | RESPIRATORY_TRACT | 0 refills | Status: DC | PRN

## 2018-05-20 MED ORDER — PREDNISONE 20 MG PO TABS: 40.0000 mg | ORAL_TABLET | Freq: Every day | ORAL | 0 refills | Status: DC

## 2018-05-20 MED ORDER — ALBUTEROL SULFATE (2.5 MG/3ML) 0.083% IN NEBU
2.5000 mg | INHALATION_SOLUTION | Freq: Once | RESPIRATORY_TRACT | Status: AC
  Administered 2018-05-20: 2.5 mg via RESPIRATORY_TRACT
  Filled 2018-05-20: qty 3

## 2018-05-20 NOTE — ED Triage Notes (Signed)
Pt in c/o cough and congestion that started last week, symptoms worsened over the weekend, pt wheezing upon arrival, reports hx of inhaler use when she had bronchitis in the past but does not use one regularly, pt speaking in full sentences

## 2018-05-20 NOTE — Discharge Instructions (Addendum)
Please read attached information.  Your symptoms are likely caused by a viral infection.  If her symptoms continue to persist beyond 7 days without improvement please initiate antibiotic therapy, if they worsen please return immediately.  Please use medication as directed.

## 2018-05-20 NOTE — ED Provider Notes (Signed)
[facility]   CSN: [id number] Arrival date & time: 05/20/18  1207     History   Chief Complaint Chief Complaint  Patient presents with  . Cough    HPI Morgan Donaldson is a 37 y.o. female.  HPI   37 year old female presents today with complaints of upper respiratory infection.  Patient notes symptoms started 4 days ago with cough congestion and rhinorrhea.  She notes some tightness in her chest, denies any significant chest pain, minor shortness of breath associated with this.  She denies any fever, productive cough.  Patient reports that she is a smoker, no history of asthma but has had episodes of bronchitis in the past.  No medications prior to arrival. Past Medical History:  Diagnosis Date  . Bronchitis     There are no active problems to display for this patient.   Past Surgical History:  Procedure Laterality Date  . ORIF PELVIC FRACTURE       OB History   None      Home Medications    Prior to Admission medications   Medication Sig Start Date End Date Taking? Authorizing Provider  albuterol (PROVENTIL HFA;VENTOLIN HFA) 108 (90 Base) MCG/ACT inhaler Inhale 1-2 puffs into the lungs every 6 (six) hours as needed for wheezing or shortness of breath. 05/20/18   Tanecia Mccay, Tinnie GensJeffrey, PA-C  azithromycin (ZITHROMAX) 250 MG tablet Take 1 tablet (250 mg total) by mouth daily. Take first 2 tablets together, then 1 every day until finished. 05/20/18   Willer Osorno, Tinnie GensJeffrey, PA-C  benzonatate (TESSALON) 100 MG capsule Take 1 capsule (100 mg total) by mouth 3 (three) times daily as needed for cough. 10/29/16   Ward, Chase PicketJaime Pilcher, PA-C  erythromycin ophthalmic ointment Place a 1/2 inch ribbon of ointment into the lower eyelid three times daily. 10/29/16   Ward, Chase PicketJaime Pilcher, PA-C  ibuprofen (ADVIL,MOTRIN) 800 MG tablet Take 1 tablet (800 mg total) by mouth 3 (three) times daily. Patient not taking: Reported on 01/28/2016 06/17/15   Anselm PancoastJoy, Shawn  C, PA-C  ibuprofen (ADVIL,MOTRIN) 800 MG tablet Take 1 tablet (800 mg total) by mouth 3 (three) times daily. 12/26/16   Mackuen, Courteney Lyn, MD  loperamide (IMODIUM) 2 MG capsule Take 1 capsule (2 mg total) 4 (four) times daily as needed by mouth for diarrhea or loose stools. 07/03/17   Horton, Mayer Maskerourtney F, MD  naproxen (NAPROSYN) 500 MG tablet Take 1 tablet (500 mg total) by mouth 2 (two) times daily. Patient not taking: Reported on 10/29/2016 03/01/16   Janne NapoleonNeese, Hope M, NP  ondansetron (ZOFRAN ODT) 4 MG disintegrating tablet Take 1 tablet (4 mg total) every 8 (eight) hours as needed by mouth for nausea or vomiting. 07/03/17   Horton, Mayer Maskerourtney F, MD  penicillin v potassium (VEETID) 500 MG tablet Take 1 tablet (500 mg total) by mouth 3 (three) times daily. 12/26/16   Mackuen, Courteney Lyn, MD  predniSONE (DELTASONE) 20 MG tablet Take 2 tablets (40 mg total) by mouth daily. 05/20/18   Rishav Rockefeller, Tinnie GensJeffrey, PA-C  Throat Lozenges (COUGH DROPS MENTHOL MT) Use as directed 1-4 lozenges in the mouth or throat daily as needed. Cough / sore throat    [provider]    Family History History reviewed. No pertinent family history.  Social History Social History   Tobacco Use  . Smoking status: Current Every Day Smoker    Packs/day: 0.00    Types: Cigarettes  . Smokeless tobacco: Never Used  Substance Use  Topics  . Alcohol use: No  . Drug use: Yes    Types: Marijuana     Allergies   Patient has no known allergies.   Review of Systems Review of Systems  All other systems reviewed and are negative.    Physical Exam Updated Vital Signs BP 128/74 (BP Location: Right Arm)   Pulse (!) 102   Temp 98 F (36.7 C) (Oral)   Resp 20   LMP 04/24/2018   SpO2 100%   Physical Exam  Constitutional: She is oriented to person, place, and time. She appears well-developed and well-nourished.  HENT:  Head: Normocephalic and atraumatic.  Right Ear: Tympanic membrane normal.  Left Ear: Tympanic membrane  normal.  Mouth/Throat: Uvula is midline and oropharynx is clear and moist. No oropharyngeal exudate, posterior oropharyngeal edema, posterior oropharyngeal erythema or tonsillar abscesses.  Eyes: Pupils are equal, round, and reactive to light. Conjunctivae are normal. Right eye exhibits no discharge. Left eye exhibits no discharge. No scleral icterus.  Neck: Normal range of motion. No JVD present. No tracheal deviation present.  Pulmonary/Chest: Effort normal. No stridor. No respiratory distress. She has no wheezes. She has no rales. She exhibits no tenderness.  Minor expiratory wheeze noted on exam, no crackles  Neurological: She is alert and oriented to person, place, and time. Coordination normal.  Psychiatric: She has a normal mood and affect. Her behavior is normal. Judgment and thought content normal.  Nursing note and vitals reviewed.    ED Treatments / Results  Labs (all labs ordered are listed, but only abnormal results are displayed) Labs Reviewed - No data to display  EKG None  Radiology Dg Chest 2 View  Result Date: 05/20/2018 CLINICAL DATA:  Cough and congestion for a few days. Wheezing. Smoker. EXAM: CHEST - 2 VIEW COMPARISON:  Chest radiograph October 29, 2017 FINDINGS: Cardiomediastinal silhouette is normal. No pleural effusions or focal consolidations. Similar mild bronchitic changes and hyperinflation. Trachea projects midline and there is no pneumothorax. Soft tissue planes and included osseous structures are non-suspicious. IMPRESSION: Similar mild bronchitic changes and hyperinflation. Electronically Signed   By: Awilda Metro M.D.   On: 05/20/2018 14:12    Procedures Procedures (including critical care time)  Medications Ordered in ED Medications  albuterol (PROVENTIL) (2.5 MG/3ML) 0.083% nebulizer solution 5 mg (5 mg Nebulization Given 05/20/18 1231)  albuterol (PROVENTIL) (2.5 MG/3ML) 0.083% nebulizer solution 2.5 mg (2.5 mg Nebulization Given 05/20/18 1514)    albuterol (PROVENTIL HFA;VENTOLIN HFA) 108 (90 Base) MCG/ACT inhaler 1-2 puff (2 puffs Inhalation Given 05/20/18 1609)     Initial Impression / Assessment and Plan / ED Course  I have reviewed the triage vital signs and the nursing notes.  Pertinent labs & imaging results that were available during my care of the patient were reviewed by me and considered in my medical decision making (see chart for details).     Labs:   Imaging: DG chest 2 view   Consults:  Therapeutics: Albuterol  Discharge Meds: Azithromycin, albuterol, prednisone  Assessment/Plan: 37 year old female presents today with upper respiratory infection.  She has 4 days of illness, no fever or acute shortness of breath.  She does have wheeze which improved with breathing treatment.  Patient has chest x-ray findings consistent with mild bronchitis.  I have low suspicion for acute bacterial infection at this time.  Patient be given steroids, albuterol.  If she continues to endorse symptoms beyond 7 days with no improvement she will initiate antibiotics, she will return immediately  if she develops any new or worsening signs or symptoms.  She verbalized understanding and agreement to today's plan had no further questions or concerns.     Final Clinical Impressions(s) / ED Diagnoses   Final diagnoses:  Bronchitis    ED Discharge Orders         Ordered    predniSONE (DELTASONE) 20 MG tablet  Daily,   Status:  Discontinued     05/20/18 1556    albuterol (PROVENTIL HFA;VENTOLIN HFA) 108 (90 Base) MCG/ACT inhaler  Every 6 hours PRN     05/20/18 1556    azithromycin (ZITHROMAX) 250 MG tablet  Daily,   Status:  Discontinued     05/20/18 1557    azithromycin (ZITHROMAX) 250 MG tablet  Daily     05/20/18 1600    predniSONE (DELTASONE) 20 MG tablet  Daily     05/20/18 1600           Eyvonne Mechanic, PA-C 05/20/18 1628    Long, Arlyss Repress, MD 05/21/18 1102

## 2018-09-23 ENCOUNTER — Encounter (HOSPITAL_COMMUNITY): Payer: Self-pay

## 2018-09-23 ENCOUNTER — Emergency Department (HOSPITAL_COMMUNITY)
Admission: EM | Admit: 2018-09-23 | Discharge: 2018-09-23 | Disposition: A | Discharge status: Home / Self Care | Payer: Self-pay | Attending: Emergency Medicine | Admitting: Emergency Medicine

## 2018-09-23 ENCOUNTER — Emergency Department (HOSPITAL_COMMUNITY): Payer: Self-pay

## 2018-09-23 DIAGNOSIS — Z79899 Other long term (current) drug therapy: Secondary | ICD-10-CM | POA: Insufficient documentation

## 2018-09-23 DIAGNOSIS — N7011 Chronic salpingitis: Secondary | ICD-10-CM

## 2018-09-23 DIAGNOSIS — R102 Pelvic and perineal pain: Secondary | ICD-10-CM

## 2018-09-23 DIAGNOSIS — F17210 Nicotine dependence, cigarettes, uncomplicated: Secondary | ICD-10-CM | POA: Insufficient documentation

## 2018-09-23 LAB — URINALYSIS, ROUTINE W REFLEX MICROSCOPIC
BILIRUBIN URINE: NEGATIVE
Glucose, UA: NEGATIVE mg/dL
Hgb urine dipstick: NEGATIVE
KETONES UR: 5 mg/dL — AB
Nitrite: NEGATIVE
PH: 5 (ref 5.0–8.0)
Protein, ur: 30 mg/dL — AB
Specific Gravity, Urine: 1.026 (ref 1.005–1.030)

## 2018-09-23 LAB — CBC
HCT: 37.5 % (ref 36.0–46.0)
Hemoglobin: 12.5 g/dL (ref 12.0–15.0)
MCH: 31.5 pg (ref 26.0–34.0)
MCHC: 33.3 g/dL (ref 30.0–36.0)
MCV: 94.5 fL (ref 80.0–100.0)
NRBC: 0 % (ref 0.0–0.2)
PLATELETS: 183 10*3/uL (ref 150–400)
RBC: 3.97 MIL/uL (ref 3.87–5.11)
RDW: 12.8 % (ref 11.5–15.5)
WBC: 21.9 10*3/uL — ABNORMAL HIGH (ref 4.0–10.5)

## 2018-09-23 LAB — COMPREHENSIVE METABOLIC PANEL
ALBUMIN: 4.1 g/dL (ref 3.5–5.0)
ALK PHOS: 40 U/L (ref 38–126)
ALT: 13 U/L (ref 0–44)
AST: 17 U/L (ref 15–41)
Anion gap: 11 (ref 5–15)
BUN: 8 mg/dL (ref 6–20)
CALCIUM: 9.1 mg/dL (ref 8.9–10.3)
CHLORIDE: 105 mmol/L (ref 98–111)
CO2: 21 mmol/L — AB (ref 22–32)
CREATININE: 0.82 mg/dL (ref 0.44–1.00)
GFR calc Af Amer: >60 mL/min (ref >60)
GFR calc non Af Amer: >60 mL/min (ref >60)
GLUCOSE: 100 mg/dL — AB (ref 70–99)
Potassium: 3.4 mmol/L — ABNORMAL LOW (ref 3.5–5.1)
SODIUM: 137 mmol/L (ref 135–145)
Total Bilirubin: 1 mg/dL (ref 0.3–1.2)
Total Protein: 7.9 g/dL (ref 6.5–8.1)

## 2018-09-23 LAB — WET PREP, GENITAL
SPERM: NONE SEEN
Trich, Wet Prep: NONE SEEN

## 2018-09-23 LAB — I-STAT BETA HCG BLOOD, ED (MC, WL, AP ONLY): HCG, QUANTITATIVE: 36.6 m[IU]/mL — AB (ref <5)

## 2018-09-23 LAB — PREGNANCY, URINE: Preg Test, Ur: NEGATIVE

## 2018-09-23 LAB — LIPASE, BLOOD: LIPASE: 23 U/L (ref 11–51)

## 2018-09-23 MED ORDER — NAPROXEN 500 MG PO TABS: 500.0000 mg | ORAL_TABLET | Freq: Two times a day (BID) | ORAL | 0 refills | Status: DC

## 2018-09-23 MED ORDER — KETOROLAC TROMETHAMINE 30 MG/ML IJ SOLN
30.0000 mg | Freq: Once | INTRAMUSCULAR | Status: AC
  Administered 2018-09-23: 30 mg via INTRAVENOUS
  Filled 2018-09-23: qty 1

## 2018-09-23 MED ORDER — MORPHINE SULFATE (PF) 4 MG/ML IV SOLN
4.0000 mg | Freq: Once | INTRAVENOUS | Status: AC
  Administered 2018-09-23: 4 mg via INTRAVENOUS
  Filled 2018-09-23: qty 1

## 2018-09-23 MED ORDER — DOXYCYCLINE HYCLATE 100 MG PO CAPS: 100.0000 mg | ORAL_CAPSULE | Freq: Two times a day (BID) | ORAL | 0 refills | Status: AC

## 2018-09-23 MED ORDER — DOXYCYCLINE HYCLATE 100 MG PO TABS
100.0000 mg | ORAL_TABLET | Freq: Once | ORAL | Status: AC
  Administered 2018-09-23: 100 mg via ORAL
  Filled 2018-09-23: qty 1

## 2018-09-23 MED ORDER — SODIUM CHLORIDE 0.9 % IV BOLUS
1000.0000 mL | Freq: Once | INTRAVENOUS | Status: AC
  Administered 2018-09-23: 1000 mL via INTRAVENOUS

## 2018-09-23 MED ORDER — LIDOCAINE HCL (PF) 1 % IJ SOLN
INTRAMUSCULAR | Status: AC
  Administered 2018-09-23: 0.9 mL
  Filled 2018-09-23: qty 5

## 2018-09-23 MED ORDER — SODIUM CHLORIDE 0.9% FLUSH: 3.0000 mL | Freq: Once | INTRAVENOUS | Status: DC

## 2018-09-23 MED ORDER — ONDANSETRON HCL 4 MG/2ML IJ SOLN
4.0000 mg | Freq: Once | INTRAMUSCULAR | Status: AC
  Administered 2018-09-23: 4 mg via INTRAVENOUS
  Filled 2018-09-23: qty 2

## 2018-09-23 MED ORDER — IOHEXOL 300 MG/ML  SOLN
100.0000 mL | Freq: Once | INTRAMUSCULAR | Status: AC | PRN
  Administered 2018-09-23: 100 mL via INTRAVENOUS

## 2018-09-23 MED ORDER — CEFTRIAXONE SODIUM 250 MG IJ SOLR
250.0000 mg | Freq: Once | INTRAMUSCULAR | Status: AC
  Administered 2018-09-23: 250 mg via INTRAMUSCULAR
  Filled 2018-09-23: qty 250

## 2018-09-23 NOTE — ED Notes (Signed)
Patient asking for ice chips, the Nurse was informed. Pt. Was told that she can't have any at this time.

## 2018-09-23 NOTE — ED Triage Notes (Signed)
Pt presents for evaluation of lower abd pain with radiation to RLQ. Pt reports some nausea. States normal BM on Friday but when symptoms started Saturday she took milk of magnesia thinking she was constipated. Pt did have bowel movements with no improvement. No vaginal or urinary complaints.

## 2018-09-23 NOTE — Discharge Instructions (Signed)
You were seen in the ER for lower abdominal pain, nausea.  CT of abdomen/pelvis showed swelling of your fallopian tubes.  Your white count was elevated.  Labs otherwise look okay.  We will treat you for pelvic inflammatory disease with doxycycline.  This is an inflammatory process, take naproxen as prescribed.  You can add 500 to 1000 mg of acetaminophen every 6 hours for additional inflammation and pain control.  You need to follow-up with OB/GYN in the next 48 to 72 hours for reevaluation.  You need routine Pap smears.  On exam you had a very small lesion on your cervix, discussed this with OB/GYN.  Return to the ER for worsening pain, fever greater than 100.4, vomiting, localized pain to the right upper or right lower abdomen, abnormal vaginal bleeding, urinary symptoms.

## 2018-09-23 NOTE — ED Provider Notes (Signed)
[facility]   CSN: [id number] Arrival date & time: 09/23/18  0944     History   Chief Complaint Chief Complaint  Patient presents with  . Abdominal Pain    HPI Morgan Donaldson is a 38 y.o. female with history of tobacco use is here for evaluation of abdominal pain.  Sudden onset on Saturday at 2 AM, gradually worsening, constant.  Located to the suprapubic lower abdomen with radiation to the right lower quadrant.  Associated with nausea.  It is worse with sitting up, moving and palpation but is also having it at rest.  Initially thought it was constipation so she took milk of magnesia and Gas-X without relief.  Her BMs have been normal for the last week.  LMP January 8.  She is sexually active with men only/boyfriend without condoms.  States she has never been able to get pregnant.  Denies associated fever, vomiting, dysuria, hematuria, frequency, changes to vaginal discharge, vaginal itching or lesions, abnormal vaginal bleeding.  No alleviating factors.  She took over-the-counter anti-inflammatory without relief.  H/o gonorrhea at age 38.   HPI  Past Medical History:  Diagnosis Date  . Bronchitis     There are no active problems to display for this patient.   Past Surgical History:  Procedure Laterality Date  . ORIF PELVIC FRACTURE       OB History   No obstetric history on file.      Home Medications    Prior to Admission medications   Medication Sig Start Date End Date Taking? Authorizing Provider  albuterol (PROVENTIL HFA;VENTOLIN HFA) 108 (90 Base) MCG/ACT inhaler Inhale 1-2 puffs into the lungs every 6 (six) hours as needed for wheezing or shortness of breath. 05/20/18   Hedges, Tinnie GensJeffrey, PA-C  azithromycin (ZITHROMAX) 250 MG tablet Take 1 tablet (250 mg total) by mouth daily. Take first 2 tablets together, then 1 every day until finished. 05/20/18   Hedges, Tinnie GensJeffrey, PA-C  benzonatate (TESSALON) 100 MG capsule Take 1  capsule (100 mg total) by mouth 3 (three) times daily as needed for cough. 10/29/16   Ward, Chase PicketJaime Pilcher, PA-C  doxycycline (VIBRAMYCIN) 100 MG capsule Take 1 capsule (100 mg total) by mouth 2 (two) times daily for 14 days. 09/23/18 10/07/18  Liberty HandyGibbons,  J, PA-C  erythromycin ophthalmic ointment Place a 1/2 inch ribbon of ointment into the lower eyelid three times daily. 10/29/16   Ward, Chase PicketJaime Pilcher, PA-C  ibuprofen (ADVIL,MOTRIN) 800 MG tablet Take 1 tablet (800 mg total) by mouth 3 (three) times daily. Patient not taking: Reported on 01/28/2016 06/17/15   Anselm PancoastJoy, Shawn C, PA-C  ibuprofen (ADVIL,MOTRIN) 800 MG tablet Take 1 tablet (800 mg total) by mouth 3 (three) times daily. 12/26/16   Mackuen, Courteney Lyn, MD  loperamide (IMODIUM) 2 MG capsule Take 1 capsule (2 mg total) 4 (four) times daily as needed by mouth for diarrhea or loose stools. 07/03/17   Horton, Mayer Maskerourtney F, MD  naproxen (NAPROSYN) 500 MG tablet Take 1 tablet (500 mg total) by mouth 2 (two) times daily. 09/23/18   Liberty HandyGibbons,  J, PA-C  ondansetron (ZOFRAN ODT) 4 MG disintegrating tablet Take 1 tablet (4 mg total) every 8 (eight) hours as needed by mouth for nausea or vomiting. 07/03/17   Horton, Mayer Maskerourtney F, MD  penicillin v potassium (VEETID) 500 MG tablet Take 1 tablet (500 mg total) by mouth 3 (three) times daily. 12/26/16   Mackuen, Courteney Lyn, MD  predniSONE (DELTASONE) 20  MG tablet Take 2 tablets (40 mg total) by mouth daily. 05/20/18   Hedges, Tinnie Gens, PA-C  Throat Lozenges (COUGH DROPS MENTHOL MT) Use as directed 1-4 lozenges in the mouth or throat daily as needed. Cough / sore throat    [provider]    Family History No family history on file.  Social History Social History   Tobacco Use  . Smoking status: Current Every Day Smoker    Packs/day: 0.00    Types: Cigarettes  . Smokeless tobacco: Never Used  Substance Use Topics  . Alcohol use: No  . Drug use: Yes    Types: Marijuana     Allergies     Patient has no known allergies.   Review of Systems Review of Systems  Gastrointestinal: Positive for abdominal pain and nausea.  All other systems reviewed and are negative.    Physical Exam Updated Vital Signs BP 123/62   Pulse 85   Temp 99.1 F (37.3 C) (Oral)   Resp 16   Ht 5\' 7"  (1.702 m)   Wt 60.8 kg   LMP 09/04/2018 (Exact Date)   SpO2 100%   BMI 20.99 kg/m   Physical Exam Vitals signs and nursing note reviewed. Exam conducted with a chaperone present.  Constitutional:      Appearance: She is well-developed.     Comments: Non toxic  HENT:     Head: Normocephalic and atraumatic.     Nose: Nose normal.  Eyes:     Conjunctiva/sclera: Conjunctivae normal.     Pupils: Pupils are equal, round, and reactive to light.  Neck:     Musculoskeletal: Normal range of motion.  Cardiovascular:     Rate and Rhythm: Normal rate and regular rhythm.  Pulmonary:     Effort: Pulmonary effort is normal.     Breath sounds: Normal breath sounds.  Abdominal:     General: Bowel sounds are normal.     Palpations: Abdomen is soft.     Tenderness: There is abdominal tenderness in the right lower quadrant and suprapubic area. Positive signs include Rovsing's sign and McBurney's sign.     Comments: Moderate diffuse lower abd tenderness worse at suprapubic region, right lower quadrant.  Positive Rovsing's and McBurney's.  Active bowel sounds to lower quadrants.  No rigidity, guarding.  No CVA tenderness.  Genitourinary:    Cervix: Lesion present.     Comments: Exam performed with RN at bedside.  External genitalia without lesions.  No groin lymphadenopathy.  Vaginal mucosa is pink.  Cervix mucosa is pink, 1 circular, erythematous, small lesion at 10:00, without bleeding or friability.  Scant likely physiologic clear/white discharge in the vaginal vault noted.  No CMT.  Nonpalpable, nontender adnexa.  Perianal skin normal without lesions. Musculoskeletal: Normal range of motion.  Skin:     General: Skin is warm and dry.     Capillary Refill: Capillary refill takes less than 2 seconds.  Neurological:     Mental Status: She is alert and oriented to person, place, and time.  Psychiatric:        Behavior: Behavior normal.      ED Treatments / Results  Labs (all labs ordered are listed, but only abnormal results are displayed) Labs Reviewed  WET PREP, GENITAL - Abnormal; Notable for the following components:      Result Value   Yeast Wet Prep HPF POC   (*)    Value: Specimen diluted due to transport tube containing more than 1 ml of  saline, interpret results with caution.   Clue Cells Wet Prep HPF POC PRESENT (*)    WBC, Wet Prep HPF POC MANY (*)    All other components within normal limits  COMPREHENSIVE METABOLIC PANEL - Abnormal; Notable for the following components:   Potassium 3.4 (*)    CO2 21 (*)    Glucose, Bld 100 (*)    All other components within normal limits  CBC - Abnormal; Notable for the following components:   WBC 21.9 (*)    All other components within normal limits  URINALYSIS, ROUTINE W REFLEX MICROSCOPIC - Abnormal; Notable for the following components:   Color, Urine AMBER (*)    APPearance HAZY (*)    Ketones, ur 5 (*)    Protein, ur 30 (*)    Leukocytes, UA SMALL (*)    Bacteria, UA RARE (*)    All other components within normal limits  I-STAT BETA HCG BLOOD, ED ([facility], [facility], [facility] ONLY) - Abnormal; Notable for the following components:   I-stat hCG, quantitative 36.6 (*)    All other components within normal limits  LIPASE, BLOOD  PREGNANCY, URINE  GC/CHLAMYDIA PROBE AMP ([facility]) NOT AT [facility]    EKG None  Radiology Ct Abdomen Pelvis W Contrast  Result Date: 09/23/2018 CLINICAL DATA:  Right lower quadrant pain and tenderness with nausea. EXAM: CT ABDOMEN AND PELVIS WITH CONTRAST TECHNIQUE: Multidetector CT imaging of the abdomen and pelvis was performed using the standard protocol following bolus administration of intravenous contrast.  CONTRAST:  100mL OMNIPAQUE IOHEXOL 300 MG/ML  SOLN COMPARISON:  01/28/2016 FINDINGS: Lower chest: Normal. Hepatobiliary: Focal area of fatty infiltration in the left lobe of the liver adjacent to the falciform ligament, slightly more prominent than on the prior study. Slight nonspecific hepatomegaly. Liver parenchyma is otherwise normal. Gallbladder is slightly distended but there is no bile duct dilatation. Pancreas: Unremarkable. No pancreatic ductal dilatation or surrounding inflammatory changes. Spleen: Normal in size without focal abnormality. Adrenals/Urinary Tract: Adrenal glands are unremarkable. Kidneys are normal, without renal calculi, focal lesion, or hydronephrosis. Bladder is unremarkable. Stomach/Bowel: Stomach is within normal limits. Appendix appears normal. There is slightly prominent small bowel loops in the left mid abdomen, nonspecific. Bowel otherwise appears normal. Vascular/Lymphatic: No significant vascular findings are present. No enlarged abdominal or pelvic lymph nodes. Reproductive: There are fluid-filled dilated tubular structures in both adnexa most likely representing hydrosalpinges. Uterus appears normal. Other: There is a small amount of fluid in the pelvic cul-de-sac, normal for a female of this age. Musculoskeletal: No acute abnormality. Previous open reduction and internal fixation of now healed pelvic fractures. IMPRESSION: 1. New bilateral hydrosalpinges. 2. New slight hepatomegaly, nonspecific. 3. Minimal dilatation of small bowel loops in the left mid abdomen, nonspecific. Electronically Signed   By: Francene BoyersJames  Maxwell M.D.   On: 09/23/2018 12:44    Procedures Procedures (including critical care time)  Medications Ordered in ED Medications  sodium chloride flush (NS) 0.9 % injection 3 mL (3 mLs Intravenous Not Given 09/23/18 1018)  morphine 4 MG/ML injection 4 mg (4 mg Intravenous Given 09/23/18 1018)  ondansetron (ZOFRAN) injection 4 mg (4 mg Intravenous Given 09/23/18  1018)  sodium chloride 0.9 % bolus 1,000 mL (0 mLs Intravenous Stopped 09/23/18 1131)  iohexol (OMNIPAQUE) 300 MG/ML solution 100 mL (100 mLs Intravenous Contrast Given 09/23/18 1225)  ketorolac (TORADOL) 30 MG/ML injection 30 mg (30 mg Intravenous Given 09/23/18 1347)  cefTRIAXone (ROCEPHIN) injection 250 mg (250 mg Intramuscular Given 09/23/18 1359)  doxycycline (VIBRA-TABS) tablet 100 mg (100 mg Oral Given 09/23/18 1358)  lidocaine (PF) (XYLOCAINE) 1 % injection (0.9 mLs  Given 09/23/18 1359)     Initial Impression / Assessment and Plan / ED Course  I have reviewed the triage vital signs and the nursing notes.  Pertinent labs & imaging results that were available during my care of the patient were reviewed by me and considered in my medical decision making (see chart for details).  Clinical Course as of Sep 23 1408  Mon Sep 23, 2018  1108 WBC(!): 21.9 [CG]  1108 I-stat hCG, quantitative(!): 36.6 [CG]  1328 IMPRESSION: 1. New bilateral hydrosalpinges. 2. New slight hepatomegaly, nonspecific. 3. Minimal dilatation of small bowel loops in the left mid abdomen, nonspecific.  CT ABDOMEN PELVIS W CONTRAST [CG]  1347 Re-evaluated pt discussed results.  Explained bilateral hydrosalpinges may be causing pain.  She has h/o gonorrhea at age 46.    [CG]    Clinical Course User Index [CG] Liberty Handy, PA-C   38 year old F with right lower quadrant abdominal pain.  Differential diagnosis includes GI process such as viral gastroenteritis, appendicitis.  Is having normal BMs. I have low suspicion for SBO, ruptured viscus.  No urinary symptoms to suggest UTI/pyelo.  GYN process such as ectopic pregnancy versus PID although this is less likely due to benign pelvic exam here. We will obtain labs, UA, meds.   1110: WBC 21.9, hcg elevated. Will send urine preg to determine further imaging studies.   1407: Negative urine pregnancy.  Urinalysis without infection.  Wet prep has many WBC, present clue  cells however negative with test.  CT shows new bilateral hydrosalpinges but no TOA.  Pain slightly improved with Toradol, morphine.  Tolerating fluids.  We will treat patient for PID given her leukocytosis, history of gonorrhea, unprotected sexual practices and hydrosalpinges on CT.  Appendicitis, TOA, gut processes considered less likely. She is to follow-up with OB/GYN in the next 72 hours for reevaluation.  Strict return precautions were given.  DC with doxycycline and naproxen.  Discussed with Dr Adela Lank.   Final Clinical Impressions(s) / ED Diagnoses   Final diagnoses:  Pelvic pain  Hydrosalpinx    ED Discharge Orders         Ordered    naproxen (NAPROSYN) 500 MG tablet  2 times daily     09/23/18 1356    doxycycline (VIBRAMYCIN) 100 MG capsule  2 times daily     09/23/18 1406           Jerrell Mylar 09/23/18 1410    Alvira Monday, MD 09/24/18 2113

## 2018-09-24 LAB — GC/CHLAMYDIA PROBE AMP (~~LOC~~) NOT AT ARMC
Chlamydia: NEGATIVE
Neisseria Gonorrhea: NEGATIVE

## 2019-04-29 ENCOUNTER — Emergency Department (HOSPITAL_COMMUNITY): Payer: Self-pay

## 2019-04-29 ENCOUNTER — Other Ambulatory Visit: Payer: Self-pay

## 2019-04-29 ENCOUNTER — Emergency Department (HOSPITAL_COMMUNITY)
Admission: EM | Admit: 2019-04-29 | Discharge: 2019-04-29 | Disposition: A | Discharge status: Home / Self Care | Payer: Self-pay | Attending: Emergency Medicine | Admitting: Emergency Medicine

## 2019-04-29 ENCOUNTER — Encounter (HOSPITAL_COMMUNITY): Payer: Self-pay

## 2019-04-29 DIAGNOSIS — F17210 Nicotine dependence, cigarettes, uncomplicated: Secondary | ICD-10-CM | POA: Insufficient documentation

## 2019-04-29 DIAGNOSIS — Z79899 Other long term (current) drug therapy: Secondary | ICD-10-CM | POA: Insufficient documentation

## 2019-04-29 DIAGNOSIS — R002 Palpitations: Secondary | ICD-10-CM | POA: Insufficient documentation

## 2019-04-29 LAB — BASIC METABOLIC PANEL
Anion gap: 12 (ref 5–15)
BUN: 9 mg/dL (ref 6–20)
CO2: 22 mmol/L (ref 22–32)
Calcium: 9.6 mg/dL (ref 8.9–10.3)
Chloride: 106 mmol/L (ref 98–111)
Creatinine, Ser: 0.77 mg/dL (ref 0.44–1.00)
GFR calc Af Amer: >60 mL/min (ref >60)
GFR calc non Af Amer: >60 mL/min (ref >60)
Glucose, Bld: 80 mg/dL (ref 70–99)
Potassium: 3.9 mmol/L (ref 3.5–5.1)
Sodium: 140 mmol/L (ref 135–145)

## 2019-04-29 LAB — TSH: TSH: 0.867 u[IU]/mL (ref 0.350–4.500)

## 2019-04-29 LAB — CBC
HCT: 43.9 % (ref 36.0–46.0)
Hemoglobin: 14.5 g/dL (ref 12.0–15.0)
MCH: 30.7 pg (ref 26.0–34.0)
MCHC: 33 g/dL (ref 30.0–36.0)
MCV: 93 fL (ref 80.0–100.0)
Platelets: 202 10*3/uL (ref 150–400)
RBC: 4.72 MIL/uL (ref 3.87–5.11)
RDW: 12.5 % (ref 11.5–15.5)
WBC: 8.9 10*3/uL (ref 4.0–10.5)
nRBC: 0 % (ref 0.0–0.2)

## 2019-04-29 LAB — TROPONIN I (HIGH SENSITIVITY)
Troponin I (High Sensitivity): <2 ng/L (ref <18)
Troponin I (High Sensitivity): 3 ng/L (ref <18)

## 2019-04-29 LAB — I-STAT BETA HCG BLOOD, ED (MC, WL, AP ONLY): I-stat hCG, quantitative: <5 m[IU]/mL (ref <5)

## 2019-04-29 LAB — D-DIMER, QUANTITATIVE: D-Dimer, Quant: 0.3 ug/mL-FEU (ref 0.00–0.50)

## 2019-04-29 MED ORDER — SODIUM CHLORIDE 0.9 % IV BOLUS
500.0000 mL | Freq: Once | INTRAVENOUS | Status: AC
  Administered 2019-04-29: 500 mL via INTRAVENOUS

## 2019-04-29 MED ORDER — SODIUM CHLORIDE 0.9% FLUSH: 3.0000 mL | Freq: Once | INTRAVENOUS | Status: DC

## 2019-04-29 NOTE — Discharge Instructions (Signed)
Take ibuprofen 600 mg every 6 hours as needed for your pain.  Please follow-up with the cardiologist as below for further evaluation and treatment of your palpitations.  Please return to the emergency department if you develop any new or worsening symptoms.  Make sure to stay well-hydrated and drink plenty of water.  Avoid caffeine.  Try to quit smoking.

## 2019-04-29 NOTE — ED Notes (Signed)
Patient verbalizes understanding of discharge instructions. Opportunity for questioning and answering were provided.  patient discharged from ED.  

## 2019-04-29 NOTE — ED Triage Notes (Signed)
Patient complains of several weeks of intermittent cp and palpitations. States that it comes for a few seconds and than resolved. patient smoker, alert and oriented, NAD

## 2019-04-29 NOTE — ED Provider Notes (Signed)
[place] EMERGENCY DEPARTMENT Provider Note   CSN: [id number] Arrival date & time: 04/29/19  1037     History   Chief Complaint Chief Complaint  Patient presents with  . intermittent CP/ palpitations    HPI Morgan Donaldson is a 38 y.o. female who presents with a one-week history of intermittent pleuritic chest pain and palpitations.  She describes the chest pain is mild and 1/10.  She has had some associated shortness of breath.  The palpitations occur frequently.  She has had some associated lightheadedness and near syncope, but no full syncope.  Patient denies any recent long trips, surgeries, known cancer, new leg pain or swelling, history of blood clots.  Patient does smoke cigarettes.  She occasionally smokes marijuana.  She denies any other drug use.  No medications taken prior to arrival.  Patient does have some significant family history of CAD.     HPI  Past Medical History:  Diagnosis Date  . Bronchitis     There are no active problems to display for this patient.   Past Surgical History:  Procedure Laterality Date  . ORIF PELVIC FRACTURE       OB History   No obstetric history on file.      Home Medications    Prior to Admission medications   Medication Sig Start Date End Date Taking? Authorizing Provider  albuterol (PROVENTIL HFA;VENTOLIN HFA) 108 (90 Base) MCG/ACT inhaler Inhale 1-2 puffs into the lungs every 6 (six) hours as needed for wheezing or shortness of breath. 05/20/18   Hedges, Dellis Filbert, PA-C  azithromycin (ZITHROMAX) 250 MG tablet Take 1 tablet (250 mg total) by mouth daily. Take first 2 tablets together, then 1 every day until finished. 05/20/18   Hedges, Dellis Filbert, PA-C  benzonatate (TESSALON) 100 MG capsule Take 1 capsule (100 mg total) by mouth 3 (three) times daily as needed for cough. 10/29/16   Ward, Ozella Almond, PA-C  erythromycin ophthalmic ointment Place a 1/2 inch ribbon of ointment into the lower eyelid three times daily.  10/29/16   Ward, Ozella Almond, PA-C  ibuprofen (ADVIL,MOTRIN) 800 MG tablet Take 1 tablet (800 mg total) by mouth 3 (three) times daily. Patient not taking: Reported on 01/28/2016 06/17/15   Lorayne Bender, PA-C  ibuprofen (ADVIL,MOTRIN) 800 MG tablet Take 1 tablet (800 mg total) by mouth 3 (three) times daily. 12/26/16   Mackuen, Courteney Lyn, MD  loperamide (IMODIUM) 2 MG capsule Take 1 capsule (2 mg total) 4 (four) times daily as needed by mouth for diarrhea or loose stools. 07/03/17   Horton, Barbette Hair, MD  naproxen (NAPROSYN) 500 MG tablet Take 1 tablet (500 mg total) by mouth 2 (two) times daily. 09/23/18   Kinnie Feil, PA-C  ondansetron (ZOFRAN ODT) 4 MG disintegrating tablet Take 1 tablet (4 mg total) every 8 (eight) hours as needed by mouth for nausea or vomiting. 07/03/17   Horton, Barbette Hair, MD  penicillin v potassium (VEETID) 500 MG tablet Take 1 tablet (500 mg total) by mouth 3 (three) times daily. 12/26/16   Mackuen, Courteney Lyn, MD  predniSONE (DELTASONE) 20 MG tablet Take 2 tablets (40 mg total) by mouth daily. 05/20/18   Hedges, Dellis Filbert, PA-C  Throat Lozenges (COUGH DROPS MENTHOL MT) Use as directed 1-4 lozenges in the mouth or throat daily as needed. Cough / sore throat    [provider]    Family History No family history on file.  Social History Social History   Tobacco  Use  . Smoking status: Current Every Day Smoker    Packs/day: 0.00    Types: Cigarettes  . Smokeless tobacco: Never Used  Substance Use Topics  . Alcohol use: No  . Drug use: Yes    Types: Marijuana     Allergies   Patient has no known allergies.   Review of Systems Review of Systems  Constitutional: Negative for chills and fever.  HENT: Negative for facial swelling and sore throat.   Respiratory: Positive for shortness of breath.   Cardiovascular: Positive for chest pain and palpitations. Negative for leg swelling.  Gastrointestinal: Negative for abdominal pain, nausea and  vomiting.  Genitourinary: Negative for dysuria.  Musculoskeletal: Negative for back pain.  Skin: Negative for rash and wound.  Neurological: Negative for headaches.  Psychiatric/Behavioral: The patient is not nervous/anxious.      Physical Exam Updated Vital Signs BP (!) 109/57 (BP Location: Right Arm)   Pulse 61   Temp 98 F (36.7 C) (Oral)   Resp 16   SpO2 99%   Physical Exam Vitals signs and nursing note reviewed.  Constitutional:      General: She is not in acute distress.    Appearance: She is well-developed. She is not diaphoretic.  HENT:     Head: Normocephalic and atraumatic.     Mouth/Throat:     Pharynx: No oropharyngeal exudate.  Eyes:     General: No scleral icterus.       Right eye: No discharge.        Left eye: No discharge.     Conjunctiva/sclera: Conjunctivae normal.     Pupils: Pupils are equal, round, and reactive to light.  Neck:     Musculoskeletal: Normal range of motion and neck supple.     Thyroid: No thyromegaly.  Cardiovascular:     Rate and Rhythm: Normal rate and regular rhythm.     Heart sounds: Normal heart sounds. No murmur. No friction rub. No gallop.   Pulmonary:     Effort: Pulmonary effort is normal. No respiratory distress.     Breath sounds: Normal breath sounds. No stridor. No wheezing or rales.  Chest:     Chest wall: No tenderness.  Abdominal:     General: Bowel sounds are normal. There is no distension.     Palpations: Abdomen is soft.     Tenderness: There is no abdominal tenderness. There is no guarding or rebound.  Musculoskeletal:     Right lower leg: No edema.     Left lower leg: No edema.  Lymphadenopathy:     Cervical: No cervical adenopathy.  Skin:    General: Skin is warm and dry.     Coloration: Skin is not pale.     Findings: No rash.  Neurological:     Mental Status: She is alert.     Coordination: Coordination normal.      ED Treatments / Results  Labs (all labs ordered are listed, but only  abnormal results are displayed) Labs Reviewed  BASIC METABOLIC PANEL  CBC  D-DIMER, QUANTITATIVE (NOT AT [facility])  TSH  I-STAT BETA HCG BLOOD, ED ([facility], [facility], [facility] ONLY)  TROPONIN I (HIGH SENSITIVITY)  TROPONIN I (HIGH SENSITIVITY)    EKG EKG Interpretation  Date/Time:  Tuesday April 29 2019 10:59:17 EDT Ventricular Rate:  66 PR Interval:  150 QRS Duration: 80 QT Interval:  426 QTC Calculation: 446 R Axis:   87 Text Interpretation:  Normal sinus rhythm unremarkable ecg Confirmed by Jeraldine Loots,  Molly MaduroRobert [phone]([id number]) on 04/29/2019 4:42:53 PM   Radiology Dg Chest 2 View  Result Date: 04/29/2019 CLINICAL DATA:  LEFT-sided chest pain radiating to LEFT shoulder/upper arm. EXAM: CHEST - 2 VIEW COMPARISON:  Chest x-ray dated 05/20/2018. FINDINGS: The heart size and mediastinal contours are within normal limits. Both lungs are clear. The visualized skeletal structures are unremarkable. IMPRESSION: No active cardiopulmonary disease. No evidence of pneumonia or pulmonary edema. Electronically Signed   By: Bary RichardStan  Maynard M.D.   On: 04/29/2019 11:30    Procedures Procedures (including critical care time)  Medications Ordered in ED Medications  sodium chloride flush (NS) 0.9 % injection 3 mL (has no administration in time range)  sodium chloride 0.9 % bolus 500 mL (0 mLs Intravenous Stopped 04/29/19 1657)     Initial Impression / Assessment and Plan / ED Course  I have reviewed the triage vital signs and the nursing notes.  Pertinent labs & imaging results that were available during my care of the patient were reviewed by me and considered in my medical decision making (see chart for details).        Patient presenting with intermittent palpitations and very mild chest pain.  Labs are unremarkable including to high-sensitivity troponins, d-dimer, TSH.  Chest x-ray is clear.  EKG shows NSR and no events documented on telemetry.  Will discharge home with follow-up to cardiology for Holter monitor.   Recommended ibuprofen as needed for chest pain.  Return precautions discussed.  Patient understands and agrees with plan.  Patient vitals stable throughout ED course and discharged in satisfactory condition.  Final Clinical Impressions(s) / ED Diagnoses   Final diagnoses:  Palpitations    ED Discharge Orders    None       Emi HolesLaw, Kris Burd M, PA-C 04/29/19 Roxine [id number]    Schlossman, Erin, MD 05/01/19 2259

## 2019-05-19 ENCOUNTER — Encounter: Payer: Self-pay | Admitting: Cardiology

## 2019-05-19 ENCOUNTER — Ambulatory Visit (INDEPENDENT_AMBULATORY_CARE_PROVIDER_SITE_OTHER): Payer: Self-pay | Admitting: Cardiology

## 2019-05-19 ENCOUNTER — Other Ambulatory Visit: Payer: Self-pay

## 2019-05-19 VITALS — BP 102/70 | HR 66 | Ht 67.0 in | Wt 136.4 lb

## 2019-05-19 DIAGNOSIS — F1911 Other psychoactive substance abuse, in remission: Secondary | ICD-10-CM

## 2019-05-19 DIAGNOSIS — R002 Palpitations: Secondary | ICD-10-CM

## 2019-05-19 DIAGNOSIS — Z8249 Family history of ischemic heart disease and other diseases of the circulatory system: Secondary | ICD-10-CM

## 2019-05-19 DIAGNOSIS — Z720 Tobacco use: Secondary | ICD-10-CM

## 2019-05-19 NOTE — Progress Notes (Signed)
Cardiology Office Note:    Date:  05/19/2019   ID:  Morgan Donaldson, DOB 08/12/1981, MRN [id number]  PCP:  Patient, No Pcp Per  Cardiologist:   Dr. Dietrich Pates - New  Referring MD: Dr. Alvira Monday  Chief Complaint  Patient presents with   Palpitations    History of Present Illness:    Morgan Donaldson is a 38 y.o. female who is being seen today for the evaluation of palpitations at the request of Dr. Dalene Seltzer.  Morgan Donaldson is a 38 y.o. female with no significant past medical history. She denies any prior heart problems or ever seeing a cardiologist. 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   Morgan Donaldson was recently seen in the ED on 04/29/2019 with intermittent pleuritic chest pain and palpitations.  She also had some mild associated shortness of breath.  She also reported some lightheadedness and near syncope, but no full syncope.  Chest x-ray was clear.  EKG was without acute ischemic changes.  No events were documented on telemetry.  She was discharged from the ED with recommendation for Holter monitor.  She was advised on ibuprofen as needed for chest pain.  Today Morgan Donaldson is here for evaluation of heart fluttering that has been present for a couple of months. It feels like butterflies in her chest. Quick flutter that happens maybe 3 times per day. She feels lightheaded a couple of minutes after the flutter, when she stands up and has to sit back down. She has occ left shoulder heaviness only rarely, last only 2-3 seconds. No chest pain or shortness of breath associated with the flutters. She has shortness of breath with exertion that she associates with smoking. She has been a pack a day smoker since age 89. She has cut down to about 1/2 PPD since ED. She would really like to quit smoking but has been unable to stop despite trying many times.   She finds it hard to breath when laying flat and uses 4 pillows to sleep for the past 3-4 months. She has had no peripheral  edema.   She has cut down on caffeine to 1 Mt Dew per day. She was drinking 2-3 20 oz bottles of Mt Dew per day and 1 cup of coffee every morning but has stopped that.   She used to use crack cocaine, but has been clean for 10 years. She only drinks alcohol rarely. She smokes marijuana daily but is trying cut back.   She lives with her boyfriend, she works full time, [employer] representative. She says that she is under stress right now due to issues with her boyfriend. She denies being in any danger.   No flowsheet data found.   Past Medical History:  Diagnosis Date   Bronchitis     Past Surgical History:  Procedure Laterality Date   ORIF PELVIC FRACTURE      Current Medications: Current Meds  Medication Sig   albuterol (PROVENTIL HFA;VENTOLIN HFA) 108 (90 Base) MCG/ACT inhaler Inhale 1-2 puffs into the lungs every 6 (six) hours as needed for wheezing or shortness of breath.   ibuprofen (ADVIL,MOTRIN) 800 MG tablet Take 1 tablet (800 mg total) by mouth 3 (three) times daily.   [DISCONTINUED] Throat Lozenges (COUGH DROPS MENTHOL MT) Use as directed 1-4 lozenges in the mouth or throat daily as needed. Cough / sore throat     Allergies:   Patient has no known allergies.   Social History   Socioeconomic  History   Marital status: Divorced    Spouse name: Not on file   Number of children: Not on file   Years of education: Not on file   Highest education level: Not on file  Occupational History   Not on file  Social Needs   Financial resource strain: Not on file   Food insecurity    Worry: Not on file    Inability: Not on file   Transportation needs    Medical: Not on file    Non-medical: Not on file  Tobacco Use   Smoking status: Current Every Day Smoker    Packs/day: 0.50    Types: Cigarettes   Smokeless tobacco: Never Used  Substance and Sexual Activity   Alcohol use: No   Drug use: Yes    Types: Marijuana   Sexual activity: Not on file  Lifestyle    Physical activity    Days per week: Not on file    Minutes per session: Not on file   Stress: Not on file  Relationships   Social connections    Talks on phone: Not on file    Gets together: Not on file    Attends religious service: Not on file    Active member of club or organization: Not on file    Attends meetings of clubs or organizations: Not on file    Relationship status: Not on file  Other Topics Concern   Not on file  Social History Narrative   Not on file     Family History: The patient's family history includes CAD in her mother; COPD in her sister; Healthy in her brother and sister; Hyperlipidemia in her sister; Hypertension in her mother and sister; Lung cancer in her mother. ROS:   Please see the history of present illness.     All other systems reviewed and are negative.  EKGs/Labs/Other Studies Reviewed:    The following studies were reviewed today: none  EKG:  The ekg from 04/29/2019 was reviewed and demonstrates NSR, 66 bpm, No st/T abnormalities  Recent Labs: 09/23/2018: ALT 13 04/29/2019: BUN 9; Creatinine, Ser 0.77; Hemoglobin 14.5; Platelets 202; Potassium 3.9; Sodium 140; TSH 0.867   Recent Lipid Panel No results found for: CHOL, TRIG, HDL, CHOLHDL, VLDL, LDLCALC, LDLDIRECT  Physical Exam:    VS:  BP 102/70    Pulse 66    Ht 5\' 7"  (1.702 m)    Wt 136 lb 6.4 oz (61.9 kg)    SpO2 99%    BMI 21.36 kg/m     Wt Readings from Last 3 Encounters:  05/19/19 136 lb 6.4 oz (61.9 kg)  09/23/18 134 lb (60.8 kg)  03/01/16 126 lb (57.2 kg)     GEN:  Well nourished, well developed in no acute distress HEENT: Normal NECK: No JVD; No carotid bruits LYMPHATICS: No lymphadenopathy CARDIAC: RRR, no murmurs, rubs, gallops RESPIRATORY:  Clear to auscultation without rales, wheezing or rhonchi  ABDOMEN: Soft, non-tender, non-distended MUSCULOSKELETAL:  No edema; No deformity  SKIN: Warm and dry NEUROLOGIC:  Alert and oriented x 3 PSYCHIATRIC:  Normal affect    ASSESSMENT:    1. Palpitations   2. Family history of heart disease in female family member before age 77   3. Tobacco abuse    PLAN:    This patient's case was discussed in depth with Dr. Tenny Craw. The plan below was formulated per our discussion.  In order of problems listed above:  Palpitations -With with about 2 months  of heart fluttering, few seconds, ~3 times per day. No chest discomfort but has occ mild lightheadedness just after flutters. No improvement with decreasing caffeine intake. She admits to being stressed due to her relationship.  -Will check echocardiogram for structure, LV function and valve status. Pt has chronic DOE that she has related to long hx of smoking, also sleeps on 4 pillows and has a hx of cocaine use (none in 10 years).  -Will check Cardiac monitor X 5 days. -If the patient has ectopy, will treat appropriately.  Possibly beta-blocker on an as-needed basis as her blood pressure and baseline heart rate are on the low side. -Patient advised to continue to avoid caffeine or other stimulants.  Family history of premature CAD in her mother -CV risk factors include family history of premature CAD in mother, smoking. No DM or HTN. She has never had cholesterol checked.  -Will check lipid panel for risk stratification.   Tobacco abuse -Has been a 1 pack/day smoker since age 38.  She has cut back to half pack per day recently but has had no success in prior attempts at cessation.  She has tried weaning off in tried stopping cold [place]key, no success. -She very much wants to stop smoking to reduce her cardiovascular risk.  We discussed some behavior changes that she can make.  Once her cardiac testing returns and if okay, she would like to try Chantix.  History of drug abuse in remission -Patient was a prior crack cocaine user, now clean for 10 years.  Patient congratulated and advised on continued cessation.  Medication Adjustments/Labs and Tests Ordered: Current  medicines are reviewed at length with the patient today.  Concerns regarding medicines are outlined above. Labs and tests ordered and medication changes are outlined in the patient instructions below:  Patient Instructions  Medication Instructions:  Your physician recommends that you continue on your current medications as directed. Please refer to the Current Medication list given to you today.  If you need a refill on your cardiac medications before your next appointment, please call your pharmacy.   Lab work: TODAY: LIPIDS   If you have labs (blood work) drawn today and your tests are completely normal, you will receive your results only by:  MyChart Message (if you have MyChart) OR  A paper copy in the mail If you have any lab test that is abnormal or we need to change your treatment, we will call you to review the results.  Testing/Procedures: Your physician has requested that you have an echocardiogram. Echocardiography is a painless test that uses sound waves to create images of your heart. It provides your doctor with information about the size and shape of your heart and how well your hearts chambers and valves are working. This procedure takes approximately one hour. There are no restrictions for this procedure.  A zio monitor was placed today. It will remain on for 5 days. You will then return monitor and event diary in provided box. It takes 1-2 weeks for report to be downloaded and returned to us. We will call you with the results. If monitor falls off or has orange flashing light, please call Zio for further instructions.     Follow-Up: At [facility], you and your health needs are our priority.  As part of our continuing mission to provide you with exceptional heart care, we have created designated Provider Care Teams.  These Care Teams include your primary Cardiologist (physician) and Advanced Practice Providers (APPs -  Physician Assistants and Nurse Practitioners) who  all work together to provide you with the care you need, when you need it. You will need a follow up appointment in:  6 months.  Please call our office 2 months in advance to schedule this appointment.  You may see or one of the following Advanced Practice Providers on your designated Care Team: Richardson Dopp, PA-C [place], [place]  Daune Perch, NP  Any Other Special Instructions Will Be Listed Below (If Applicable).    Coping with Quitting Smoking  Quitting smoking is a physical and mental challenge. You will face cravings, withdrawal symptoms, and temptation. Before quitting, work with your health care provider to make a plan that can help you cope. Preparation can help you quit and keep you from giving in. How can I cope with cravings? Cravings usually last for 5-10 minutes. If you get through it, the craving will pass. Consider taking the following actions to help you cope with cravings:  Keep your mouth busy: ? Chew sugar-free gum. ? Suck on hard candies or a straw. ? Brush your teeth.  Keep your hands and body busy: ? Immediately change to a different activity when you feel a craving. ? Squeeze or play with a ball. ? Do an activity or a hobby, like making bead jewelry, practicing needlepoint, or working with wood. ? Mix up your normal routine. ? Take a short exercise break. Go for a quick walk or run up and down stairs. ? Spend time in public places where smoking is not allowed.  Focus on doing something kind or helpful for someone else.  Call a friend or family member to talk during a craving.  Join a support group.  Call a quit line, such as 1-800-QUIT-NOW.  Talk with your health care provider about medicines that might help you cope with cravings and make quitting easier for you. How can I deal with withdrawal symptoms? Your body may experience negative effects as it tries to get used to not having nicotine in the system. These effects are called withdrawal  symptoms. They may include:  Feeling hungrier than normal.  Trouble concentrating.  Irritability.  Trouble sleeping.  Feeling depressed.  Restlessness and agitation.  Craving a cigarette. To manage withdrawal symptoms:  Avoid places, people, and activities that trigger your cravings.  Remember why you want to quit.  Get plenty of sleep.  Avoid coffee and other caffeinated drinks. These may worsen some of your symptoms. How can I handle social situations? Social situations can be difficult when you are quitting smoking, especially in the first few weeks. To manage this, you can:  Avoid parties, bars, and other social situations where people might be smoking.  Avoid alcohol.  Leave right away if you have the urge to smoke.  Explain to your family and friends that you are quitting smoking. Ask for understanding and support.  Plan activities with friends or family where smoking is not an option. What are some ways I can cope with stress? Wanting to smoke may cause stress, and stress can make you want to smoke. Find ways to manage your stress. Relaxation techniques can help. For example:  Breathe slowly and deeply, in through your nose and out through your mouth.  Listen to soothing, relaxing music.  Talk with a family member or friend about your stress.  Light a candle.  Soak in a bath or take a shower.  Think about a peaceful place. What are some ways I can prevent  weight gain? Be aware that many people gain weight after they quit smoking. However, not everyone does. To keep from gaining weight, have a plan in place before you quit and stick to the plan after you quit. Your plan should include:  Having healthy snacks. When you have a craving, it may help to: ? Eat plain popcorn, crunchy carrots, celery, or other cut vegetables. ? Chew sugar-free gum.  Changing how you eat: ? Eat small portion sizes at meals. ? Eat 4-6 small meals throughout the day instead of  1-2 large meals a day. ? Be mindful when you eat. Do not watch television or do other things that might distract you as you eat.  Exercising regularly: ? Make time to exercise each day. If you do not have time for a long workout, do short bouts of exercise for 5-10 minutes several times a day. ? Do some form of strengthening exercise, like weight lifting, and some form of aerobic exercise, like running or swimming.  Drinking plenty of water or other low-calorie or no-calorie drinks. Drink 6-8 glasses of water daily, or as much as instructed by your health care provider. Summary  Quitting smoking is a physical and mental challenge. You will face cravings, withdrawal symptoms, and temptation to smoke again. Preparation can help you as you go through these challenges.  You can cope with cravings by keeping your mouth busy (such as by chewing gum), keeping your body and hands busy, and making calls to family, friends, or a helpline for people who want to quit smoking.  You can cope with withdrawal symptoms by avoiding places where people smoke, avoiding drinks with caffeine, and getting plenty of rest.  Ask your health care provider about the different ways to prevent weight gain, avoid stress, and handle social situations. This information is not intended to replace advice given to you by your health care provider. Make sure you discuss any questions you have with your health care provider. Document Released: 08/11/2016 Document Revised: 07/27/2017 Document Reviewed: 08/11/2016 Elsevier Patient Education  2020 [employer].     Signed, Berton Bon, NP  05/19/2019 6:20 PM    [facility]

## 2019-05-19 NOTE — Patient Instructions (Addendum)
Medication Instructions:  Your physician recommends that you continue on your current medications as directed. Please refer to the Current Medication list given to you today.  If you need a refill on your cardiac medications before your next appointment, please call your pharmacy.   Lab work: TODAY: LIPIDS   If you have labs (blood work) drawn today and your tests are completely normal, you will receive your results only by: Marland Kitchen. MyChart Message (if you have MyChart) OR . A paper copy in the mail If you have any lab test that is abnormal or we need to change your treatment, we will call you to review the results.  Testing/Procedures: Your physician has requested that you have an echocardiogram. Echocardiography is a painless test that uses sound waves to create images of your heart. It provides your doctor with information about the size and shape of your heart and how well your heart's chambers and valves are working. This procedure takes approximately one hour. There are no restrictions for this procedure.  A zio monitor was placed today. It will remain on for 5 days. You will then return monitor and event diary in provided box. It takes 1-2 weeks for report to be downloaded and returned to us. We will call you with the results. If monitor falls off or has orange flashing light, please call Zio for further instructions.     Follow-Up: At [facility], you and your health needs are our priority.  As part of our continuing mission to provide you with exceptional heart care, we have created designated Provider Care Teams.  These Care Teams include your primary Cardiologist (physician) and Advanced Practice Providers (APPs -  Physician Assistants and Nurse Practitioners) who all work together to provide you with the care you need, when you need it. You will need a follow up appointment in:  6 months.  Please call our office 2 months in advance to schedule this appointment.  You may see or one of  the following Advanced Practice Providers on your designated Care Team: Tereso NewcomerScott Weaver, PA-C [place], [place]PA-C . Berton BonJanine Hammond, NP  Any Other Special Instructions Will Be Listed Below (If Applicable).    Coping with Quitting Smoking  Quitting smoking is a physical and mental challenge. You will face cravings, withdrawal symptoms, and temptation. Before quitting, work with your health care provider to make a plan that can help you cope. Preparation can help you quit and keep you from giving in. How can I cope with cravings? Cravings usually last for 5-10 minutes. If you get through it, the craving will pass. Consider taking the following actions to help you cope with cravings:  Keep your mouth busy: ? Chew sugar-free gum. ? Suck on hard candies or a straw. ? Brush your teeth.  Keep your hands and body busy: ? Immediately change to a different activity when you feel a craving. ? Squeeze or play with a ball. ? Do an activity or a hobby, like making bead jewelry, practicing needlepoint, or working with wood. ? Mix up your normal routine. ? Take a short exercise break. Go for a quick walk or run up and down stairs. ? Spend time in public places where smoking is not allowed.  Focus on doing something kind or helpful for someone else.  Call a friend or family member to talk during a craving.  Join a support group.  Call a quit line, such as 1-800-QUIT-NOW.  Talk with your health care provider about medicines that  might help you cope with cravings and make quitting easier for you. How can I deal with withdrawal symptoms? Your body may experience negative effects as it tries to get used to not having nicotine in the system. These effects are called withdrawal symptoms. They may include:  Feeling hungrier than normal.  Trouble concentrating.  Irritability.  Trouble sleeping.  Feeling depressed.  Restlessness and agitation.  Craving a cigarette. To manage withdrawal symptoms:   Avoid places, people, and activities that trigger your cravings.  Remember why you want to quit.  Get plenty of sleep.  Avoid coffee and other caffeinated drinks. These may worsen some of your symptoms. How can I handle social situations? Social situations can be difficult when you are quitting smoking, especially in the first few weeks. To manage this, you can:  Avoid parties, bars, and other social situations where people might be smoking.  Avoid alcohol.  Leave right away if you have the urge to smoke.  Explain to your family and friends that you are quitting smoking. Ask for understanding and support.  Plan activities with friends or family where smoking is not an option. What are some ways I can cope with stress? Wanting to smoke may cause stress, and stress can make you want to smoke. Find ways to manage your stress. Relaxation techniques can help. For example:  Breathe slowly and deeply, in through your nose and out through your mouth.  Listen to soothing, relaxing music.  Talk with a family member or friend about your stress.  Light a candle.  Soak in a bath or take a shower.  Think about a peaceful place. What are some ways I can prevent weight gain? Be aware that many people gain weight after they quit smoking. However, not everyone does. To keep from gaining weight, have a plan in place before you quit and stick to the plan after you quit. Your plan should include:  Having healthy snacks. When you have a craving, it may help to: ? Eat plain popcorn, crunchy carrots, celery, or other cut vegetables. ? Chew sugar-free gum.  Changing how you eat: ? Eat small portion sizes at meals. ? Eat 4-6 small meals throughout the day instead of 1-2 large meals a day. ? Be mindful when you eat. Do not watch television or do other things that might distract you as you eat.  Exercising regularly: ? Make time to exercise each day. If you do not have time for a long workout,  do short bouts of exercise for 5-10 minutes several times a day. ? Do some form of strengthening exercise, like weight lifting, and some form of aerobic exercise, like running or swimming.  Drinking plenty of water or other low-calorie or no-calorie drinks. Drink 6-8 glasses of water daily, or as much as instructed by your health care provider. Summary  Quitting smoking is a physical and mental challenge. You will face cravings, withdrawal symptoms, and temptation to smoke again. Preparation can help you as you go through these challenges.  You can cope with cravings by keeping your mouth busy (such as by chewing gum), keeping your body and hands busy, and making calls to family, friends, or a helpline for people who want to quit smoking.  You can cope with withdrawal symptoms by avoiding places where people smoke, avoiding drinks with caffeine, and getting plenty of rest.  Ask your health care provider about the different ways to prevent weight gain, avoid stress, and handle social situations. This information is  not intended to replace advice given to you by your health care provider. Make sure you discuss any questions you have with your health care provider. Document Released: 08/11/2016 Document Revised: 07/27/2017 Document Reviewed: 08/11/2016 Elsevier Patient Education  2020 [employer].

## 2019-05-20 LAB — LIPID PANEL
Chol/HDL Ratio: 2.6 ratio (ref 0.0–4.4)
Cholesterol, Total: 169 mg/dL (ref 100–199)
HDL: 66 mg/dL (ref >39)
LDL Chol Calc (NIH): 88 mg/dL (ref 0–99)
Triglycerides: 81 mg/dL (ref 0–149)
VLDL Cholesterol Cal: 15 mg/dL (ref 5–40)

## 2019-05-23 ENCOUNTER — Telehealth: Payer: Self-pay

## 2019-05-23 NOTE — Telephone Encounter (Signed)
LM for pt to call back to discuss monitor instructions so we can get it ordered for her.

## 2019-05-23 NOTE — Telephone Encounter (Signed)
Spoke to pt, went over brief monitor instructions. Pt is self-pay so I explained to her that I would put the order in and the company would contact her to set up a payment plan or to discuss her options. She agreed to this. Verified address. 5 day Zio patch was ordered to be sent to pt's home.

## 2019-05-23 NOTE — Telephone Encounter (Signed)
Follow up ° ° °Patient is returning your call. Please call. ° ° ° °

## 2019-05-26 ENCOUNTER — Other Ambulatory Visit: Payer: Self-pay

## 2019-05-26 ENCOUNTER — Ambulatory Visit (HOSPITAL_COMMUNITY): Payer: Self-pay | Attending: Internal Medicine

## 2019-05-26 DIAGNOSIS — R002 Palpitations: Secondary | ICD-10-CM | POA: Insufficient documentation

## 2019-07-13 ENCOUNTER — Emergency Department (HOSPITAL_COMMUNITY): Payer: Self-pay

## 2019-07-13 ENCOUNTER — Other Ambulatory Visit: Payer: Self-pay

## 2019-07-13 ENCOUNTER — Emergency Department (HOSPITAL_COMMUNITY)
Admission: EM | Admit: 2019-07-13 | Discharge: 2019-07-13 | Disposition: A | Discharge status: Home / Self Care | Payer: Self-pay | Attending: Emergency Medicine | Admitting: Emergency Medicine

## 2019-07-13 ENCOUNTER — Encounter (HOSPITAL_COMMUNITY): Payer: Self-pay | Admitting: Emergency Medicine

## 2019-07-13 DIAGNOSIS — F17210 Nicotine dependence, cigarettes, uncomplicated: Secondary | ICD-10-CM | POA: Insufficient documentation

## 2019-07-13 DIAGNOSIS — R1031 Right lower quadrant pain: Secondary | ICD-10-CM

## 2019-07-13 DIAGNOSIS — Z79899 Other long term (current) drug therapy: Secondary | ICD-10-CM | POA: Insufficient documentation

## 2019-07-13 DIAGNOSIS — N83292 Other ovarian cyst, left side: Secondary | ICD-10-CM | POA: Insufficient documentation

## 2019-07-13 DIAGNOSIS — D72829 Elevated white blood cell count, unspecified: Secondary | ICD-10-CM | POA: Insufficient documentation

## 2019-07-13 DIAGNOSIS — N83209 Unspecified ovarian cyst, unspecified side: Secondary | ICD-10-CM

## 2019-07-13 LAB — CBC WITH DIFFERENTIAL/PLATELET
Abs Immature Granulocytes: 0.12 10*3/uL — ABNORMAL HIGH (ref 0.00–0.07)
Basophils Absolute: 0.1 10*3/uL (ref 0.0–0.1)
Basophils Relative: 0 %
Eosinophils Absolute: 0 10*3/uL (ref 0.0–0.5)
Eosinophils Relative: 0 %
HCT: 42.9 % (ref 36.0–46.0)
Hemoglobin: 14.2 g/dL (ref 12.0–15.0)
Immature Granulocytes: 1 %
Lymphocytes Relative: 6 %
Lymphs Abs: 1.2 10*3/uL (ref 0.7–4.0)
MCH: 31.4 pg (ref 26.0–34.0)
MCHC: 33.1 g/dL (ref 30.0–36.0)
MCV: 94.9 fL (ref 80.0–100.0)
Monocytes Absolute: 0.6 10*3/uL (ref 0.1–1.0)
Monocytes Relative: 3 %
Neutro Abs: 17.8 10*3/uL — ABNORMAL HIGH (ref 1.7–7.7)
Neutrophils Relative %: 90 %
Platelets: 234 10*3/uL (ref 150–400)
RBC: 4.52 MIL/uL (ref 3.87–5.11)
RDW: 13.2 % (ref 11.5–15.5)
WBC: 19.8 10*3/uL — ABNORMAL HIGH (ref 4.0–10.5)
nRBC: 0 % (ref 0.0–0.2)

## 2019-07-13 LAB — URINALYSIS, ROUTINE W REFLEX MICROSCOPIC
Bilirubin Urine: NEGATIVE
Glucose, UA: NEGATIVE mg/dL
Hgb urine dipstick: NEGATIVE
Ketones, ur: 5 mg/dL — AB
Leukocytes,Ua: NEGATIVE
Nitrite: NEGATIVE
Protein, ur: NEGATIVE mg/dL
Specific Gravity, Urine: >1.046 — ABNORMAL HIGH (ref 1.005–1.030)
pH: 5 (ref 5.0–8.0)

## 2019-07-13 LAB — I-STAT BETA HCG BLOOD, ED (MC, WL, AP ONLY): I-stat hCG, quantitative: <5 m[IU]/mL (ref <5)

## 2019-07-13 LAB — COMPREHENSIVE METABOLIC PANEL
ALT: 13 U/L (ref 0–44)
AST: 19 U/L (ref 15–41)
Albumin: 4.4 g/dL (ref 3.5–5.0)
Alkaline Phosphatase: 37 U/L — ABNORMAL LOW (ref 38–126)
Anion gap: 10 (ref 5–15)
BUN: 7 mg/dL (ref 6–20)
CO2: 21 mmol/L — ABNORMAL LOW (ref 22–32)
Calcium: 9 mg/dL (ref 8.9–10.3)
Chloride: 109 mmol/L (ref 98–111)
Creatinine, Ser: 0.78 mg/dL (ref 0.44–1.00)
GFR calc Af Amer: >60 mL/min (ref >60)
GFR calc non Af Amer: >60 mL/min (ref >60)
Glucose, Bld: 92 mg/dL (ref 70–99)
Potassium: 3.8 mmol/L (ref 3.5–5.1)
Sodium: 140 mmol/L (ref 135–145)
Total Bilirubin: 0.9 mg/dL (ref 0.3–1.2)
Total Protein: 7.4 g/dL (ref 6.5–8.1)

## 2019-07-13 LAB — LIPASE, BLOOD: Lipase: 27 U/L (ref 11–51)

## 2019-07-13 MED ORDER — NAPROXEN 500 MG PO TABS: 500.0000 mg | ORAL_TABLET | Freq: Two times a day (BID) | ORAL | 0 refills | Status: DC

## 2019-07-13 MED ORDER — FENTANYL CITRATE (PF) 100 MCG/2ML IJ SOLN
50.0000 ug | Freq: Once | INTRAMUSCULAR | Status: AC
  Administered 2019-07-13: 50 ug via INTRAVENOUS
  Filled 2019-07-13: qty 2

## 2019-07-13 MED ORDER — SODIUM CHLORIDE 0.9 % IV BOLUS
1000.0000 mL | Freq: Once | INTRAVENOUS | Status: AC
  Administered 2019-07-13: 17:00:00 1000 mL via INTRAVENOUS

## 2019-07-13 MED ORDER — SODIUM CHLORIDE 0.9% FLUSH: 3.0000 mL | Freq: Once | INTRAVENOUS | Status: DC

## 2019-07-13 MED ORDER — IOHEXOL 300 MG/ML  SOLN
100.0000 mL | Freq: Once | INTRAMUSCULAR | Status: AC | PRN
  Administered 2019-07-13: 15:00:00 100 mL via INTRAVENOUS

## 2019-07-13 MED ORDER — ONDANSETRON HCL 4 MG/2ML IJ SOLN
4.0000 mg | Freq: Once | INTRAMUSCULAR | Status: AC
  Administered 2019-07-13: 4 mg via INTRAVENOUS
  Filled 2019-07-13: qty 2

## 2019-07-13 NOTE — ED Notes (Signed)
To ultrasound

## 2019-07-13 NOTE — ED Notes (Signed)
Pt discharge instructions and prescriptions reviewed. Pt verbalized understanding of each. Pt discharged.

## 2019-07-13 NOTE — ED Notes (Signed)
Pt back from ultrasound.

## 2019-07-13 NOTE — ED Provider Notes (Signed)
  Provider Note MRN:  [id number]  Arrival date & time: 07/13/19    ED Course and Medical Decision Making  Assumed care from Dr. Roslynn Amble at shift change.  Right lower quadrant pain, nausea, vomiting, leukocytosis, awaiting CT imaging.  CT without evidence of appendicitis, left ovarian cyst, radiology recommending follow-up with ultrasound.  Ultrasound demonstrating no signs of torsion, likely hemorrhagic cyst.  This explains patient's pain and I suspect her pain response because this leukocytosis.  No fever, pain controlled, appropriate for discharge.  Procedures  Final Clinical Impressions(s) / ED Diagnoses     ICD-10-CM   1. Right lower quadrant abdominal pain  R10.31   2. Leukocytosis, unspecified type  D72.829   3. Abdominal pain, RLQ  R10.31 US PELVIC COMPLETE W TRANSVAGINAL AND TORSION R/O    US PELVIC COMPLETE W TRANSVAGINAL AND TORSION R/O  4. Hemorrhagic ovarian cyst  N83.209     ED Discharge Orders         Ordered    naproxen (NAPROSYN) 500 MG tablet  2 times daily     07/13/19 1718            Discharge Instructions     You were evaluated in the Emergency Department and after careful evaluation, we did not find any emergent condition requiring admission or further testing in the hospital.  Your exam/testing today is overall reassuring.  Your pain seems to be due to an ovarian cyst.  This will heal on its own.  Please take the Naprosyn anti-inflammatory medication as needed for pain and follow-up with a GYN doctor.  Please return to the Emergency Department if you experience any worsening of your condition.  We encourage you to follow up with a primary care provider.  Thank you for allowing [place] to be a part of your care.     Barth Kirks. Sedonia Small, [place] [email or web address]    Maudie Flakes, MD 07/13/19 [phone]

## 2019-07-13 NOTE — Discharge Instructions (Signed)
You were evaluated in the Emergency Department and after careful evaluation, we did not find any emergent condition requiring admission or further testing in the hospital.  Your exam/testing today is overall reassuring.  Your pain seems to be due to an ovarian cyst.  This will heal on its own.  Please take the Naprosyn anti-inflammatory medication as needed for pain and follow-up with a GYN doctor.  Please return to the Emergency Department if you experience any worsening of your condition.  We encourage you to follow up with a primary care provider.  Thank you for allowing [place] to be a part of your care.

## 2019-07-13 NOTE — ED Provider Notes (Signed)
[place] EMERGENCY DEPARTMENT Provider Note   CSN: [id number] Arrival date & time: 07/13/19  1155     History   Chief Complaint Chief Complaint  Patient presents with  . Abdominal Pain  . Emesis    HPI Morgan Donaldson is a 38 y.o. female.  Presents to the emergency department with chief complaint of abdominal pain.  Patient reports that symptoms started around 1030 this morning, initially noted nausea, followed soon by abdominal pain.  Patient states pain has been constant, sharp, stabbing pain, 7 out of 10 in severity, worse in her lower abdomen, right lower abdomen.  Denies pelvic pain but says that her right lower quadrant pain seems to radiate to her groin.  Try taking Tylenol with no change in symptoms.  Has had 4 episodes of nonbloody nonbilious emesis.  No fevers.  No diarrhea or constipation.  No new vaginal discharge.  Denies any medical problems, denies prior history of gallbladder or appendix problems.     HPI  Past Medical History:  Diagnosis Date  . Bronchitis     Patient Active Problem List   Diagnosis Date Noted  . Tobacco abuse 05/19/2019  . 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 ORIF PELVIC FRACTURE       OB History   No obstetric history on file.      Home Medications    Prior to Admission medications   Medication Sig Start Date End Date Taking? Authorizing Provider  acetaminophen (TYLENOL) 500 MG tablet Take 1,000 mg by mouth every 6 (six) hours as needed for mild pain.   Yes [provider]  albuterol (PROVENTIL HFA;VENTOLIN HFA) 108 (90 Base) MCG/ACT inhaler Inhale 1-2 puffs into the lungs every 6 (six) hours as needed for wheezing or shortness of breath. 05/20/18   Hedges, Dellis Filbert, PA-C  ibuprofen (ADVIL,MOTRIN) 800 MG tablet Take 1 tablet (800 mg total) by mouth 3 (three) times daily. 12/26/16   Mackuen, Fredia Sorrow, MD    Family History  Family History  Problem Relation Age of Onset  . Lung cancer Mother        died at age 80  . CAD Mother        MI at age 80  . Hypertension Mother   . Hypertension Sister   . Hyperlipidemia Sister   . COPD Sister   . Healthy Brother   . Healthy Sister     Social History Social History   Tobacco Use  . Smoking status: Current Every Day Smoker    Packs/day: 0.50    Types: Cigarettes  . Smokeless tobacco: Never Used  Substance Use Topics  . Alcohol use: No  . Drug use: Yes    Types: Marijuana     Allergies   Patient has no known allergies.   Review of Systems Review of Systems  Constitutional: Negative for chills and fever.  HENT: Negative for ear pain and sore throat.   Eyes: Negative for pain and visual disturbance.  Respiratory: Negative for cough and shortness of breath.   Cardiovascular: Negative for chest pain and palpitations.  Gastrointestinal: Positive for abdominal pain and vomiting.  Genitourinary: Negative for dysuria and hematuria.  Musculoskeletal: Negative for arthralgias and back pain.  Skin: Negative for color change and rash.  Neurological: Negative for seizures and syncope.  All other systems reviewed and are negative.    Physical Exam Updated Vital Signs BP 119/89 (BP  Location: Left Arm)   Pulse (!) 56   Temp 98.7 F (37.1 C) (Oral)   Resp 18   LMP 06/18/2019   SpO2 99%   Physical Exam Vitals signs and nursing note reviewed.  Constitutional:      General: She is not in acute distress.    Appearance: She is well-developed.  HENT:     Head: Normocephalic and atraumatic.  Eyes:     Conjunctiva/sclera: Conjunctivae normal.  Neck:     Musculoskeletal: Neck supple.  Cardiovascular:     Rate and Rhythm: Normal rate and regular rhythm.     Heart sounds: No murmur.  Pulmonary:     Effort: Pulmonary effort is normal. No respiratory distress.     Breath sounds: Normal breath sounds.  Abdominal:     Palpations: Abdomen is soft.      Tenderness: There is abdominal tenderness in the right lower quadrant. There is no guarding or rebound. Positive signs include McBurney's sign.  Skin:    General: Skin is warm and dry.  Neurological:     Mental Status: She is alert.      ED Treatments / Results  Labs (all labs ordered are listed, but only abnormal results are displayed) Labs Reviewed  LIPASE, BLOOD  COMPREHENSIVE METABOLIC PANEL  URINALYSIS, ROUTINE W REFLEX MICROSCOPIC  CBC WITH DIFFERENTIAL/PLATELET  I-STAT BETA HCG BLOOD, ED ([facility], [facility], [facility] ONLY)    EKG None  Radiology No results found.  Procedures Procedures (including critical care time)  Medications Ordered in ED Medications  sodium chloride flush (NS) 0.9 % injection 3 mL (has no administration in time range)  ondansetron (ZOFRAN) injection 4 mg (has no administration in time range)  fentaNYL (SUBLIMAZE) injection 50 mcg (has no administration in time range)     Initial Impression / Assessment and Plan / ED Course  I have reviewed the triage vital signs and the nursing notes.  Pertinent labs & imaging results that were available during my care of the patient were reviewed by me and considered in my medical decision making (see chart for details).        There is unrelated presents in respiratory complaint right lower quadrant abdominal pain.  On exam did not have focal tenderness in her abdomen.  On vital signs stable.  Labs concerning for leukocytosis.  I will check CT scan to further evaluate.  While waiting for CT results, patient signed out to Dr. Pilar Plate.  Please refer to his note for final plan and disposition.  Final Clinical Impressions(s) / ED Diagnoses   Final diagnoses:  Right lower quadrant abdominal pain  Leukocytosis, unspecified type    ED Discharge Orders    None       Milagros Loll, MD 07/13/19 1528

## 2019-07-13 NOTE — ED Triage Notes (Signed)
C/o lower abd pain, nausea, and vomiting since 10am.  Denies diarrhea.

## 2019-08-11 ENCOUNTER — Encounter: Payer: Self-pay | Admitting: Obstetrics and Gynecology

## 2019-08-11 ENCOUNTER — Ambulatory Visit (INDEPENDENT_AMBULATORY_CARE_PROVIDER_SITE_OTHER): Payer: Self-pay | Admitting: Obstetrics and Gynecology

## 2019-08-11 ENCOUNTER — Other Ambulatory Visit: Payer: Self-pay

## 2019-08-11 VITALS — BP 121/65 | HR 71 | Temp 98.6°F | Wt 141.0 lb

## 2019-08-11 DIAGNOSIS — Z124 Encounter for screening for malignant neoplasm of cervix: Secondary | ICD-10-CM

## 2019-08-11 DIAGNOSIS — N83202 Unspecified ovarian cyst, left side: Secondary | ICD-10-CM

## 2019-08-11 DIAGNOSIS — Z113 Encounter for screening for infections with a predominantly sexual mode of transmission: Secondary | ICD-10-CM

## 2019-08-11 DIAGNOSIS — Z1151 Encounter for screening for human papillomavirus (HPV): Secondary | ICD-10-CM

## 2019-08-11 DIAGNOSIS — Z720 Tobacco use: Secondary | ICD-10-CM

## 2019-08-11 NOTE — Progress Notes (Signed)
Obstetrics and Gynecology New Patient Evaluation  Appointment Date: 08/11/2019  [facility]  Primary Care Provider: Patient, No Pcp Per  Ref[facility] Provider[facility]  Chief Complaint:  Chief Complaint  Patient presents with  . Abdominal Pain    History of Present Illness: Morgan BeamsRobin Donaldson is a 38 y.o. African-American G0 (Patient's last menstrual period was 07/16/2019.), seen for the above chief complaint. Her past medical history is significant for tobacco abuse.  Patient went to ED on 11/15 for abdominal CT scan showed LO cyst and u/s showed 4cm LO hemorrhagic cyst. She had negative u/a, beta hcg, cmp and lipase. WBC was elevated at 19.8  She states the s/s began a few weeks before going to the ED and got worse with sex so she went to the ED. ?h/o s/s in the past.   Today she still has some discomfort there but it is better than when she was at the ED.  Review of Systems: all other systems reviewed and are negative  Patient Active Problem List   Diagnosis Date Noted  . Hemorrhagic cyst of left ovary 08/11/2019  . Tobacco abuse 05/19/2019  . History of drug abuse in remission ([facility]) 05/19/2019    Past Medical History:  Past Medical History:  Diagnosis Date  . Bronchitis   . Hydrosalpinx    bilateral  . Palpitations   . Physiological ovarian cysts     Past Surgical History:  Past Surgical History:  Procedure Laterality Date  . ORIF PELVIC FRACTURE      Past Obstetrical History:  OB History  Gravida Para Term Preterm AB Living  0 0 0 0 0 0  SAB TAB Ectopic Multiple Live Births  0 0 0 0 0    Past Gynecological History: As per HPI. Periods: qmonth, regular History of Pap Smear(s): unknown  She is currently using no method for contraception.   Social History:  Social History   Socioeconomic History  . Marital status: Divorced    Spouse name: Not on file  . Number of children: Not on file  . Years of education: Not  on file  . Highest education level: Not on file  Occupational History  . Not on file  Tobacco Use  . Smoking status: Current Every Day Smoker    Packs/day: 0.50    Types: Cigarettes  . Smokeless tobacco: Never Used  Substance and Sexual Activity  . Alcohol use: No  . Drug use: Yes    Types: Marijuana  . Sexual activity: Not on file  Other Topics Concern  . Not on file  Social History Narrative  . Not on file   Social Determinants of Health   Financial Resource Strain:   . Difficulty of Paying Living Expenses: Not on file  Food Insecurity:   . Worried About Programme researcher, broadcasting/film/videounning Out of Food in the Last Year: Not on file  . Ran Out of Food in the Last Year: Not on file  Transportation Needs:   . Lack of Transportation (Medical): Not on file  . Lack of Transportation (Non-Medical): Not on file  Physical Activity:   . Days of Exercise per Week: Not on file  . Minutes of Exercise per Session: Not on file  Stress:   . Feeling of Stress : Not on file  Social Connections:   . Frequency of Communication with Friends and Family: Not on file  . Frequency of Social Gatherings with Friends and Family: Not on file  . Attends  Religious Services: Not on file  . Active Member of Clubs or Organizations: Not on file  . Attends Banker Meetings: Not on file  . Marital Status: Not on file  Intimate Partner Violence:   . Fear of Current or Ex-Partner: Not on file  . Emotionally Abused: Not on file  . Physically Abused: Not on file  . Sexually Abused: Not on file    Family History:  Family History  Problem Relation Age of Onset  . Lung cancer Mother        died at age 48  . CAD Mother        MI at age 57  . Hypertension Mother   . Hypertension Sister   . Hyperlipidemia Sister   . COPD Sister   . Healthy Brother   . Healthy Sister    Medications Eular Panek had no medications administered during this visit. Current Outpatient Medications  Medication Sig Dispense Refill  .  acetaminophen (TYLENOL) 500 MG tablet Take 1,000 mg by mouth every 6 (six) hours as needed for mild pain.    Marland Kitchen albuterol (PROVENTIL HFA;VENTOLIN HFA) 108 (90 Base) MCG/ACT inhaler Inhale 1-2 puffs into the lungs every 6 (six) hours as needed for wheezing or shortness of breath. (Patient not taking: Reported on 07/13/2019) 1 Inhaler 0  . ibuprofen (ADVIL,MOTRIN) 800 MG tablet Take 1 tablet (800 mg total) by mouth 3 (three) times daily. (Patient not taking: Reported on 07/13/2019) 21 tablet 0  . naproxen (NAPROSYN) 500 MG tablet Take 1 tablet (500 mg total) by mouth 2 (two) times daily. (Patient not taking: Reported on 08/11/2019) 30 tablet 0   No current facility-administered medications for this visit.    Allergies Patient has no known allergies.   Physical Exam:  BP 121/65   Pulse 71   Temp 98.6 F (37 C)   Wt 141 lb (64 kg)   LMP 07/16/2019   BMI 22.08 kg/m  Body mass index is 22.08 kg/m.  General appearance: Well nourished, well developed female in no acute distress.  Abdomen: positive bowel sounds and no masses, hernias; diffusely non tender to palpation, non distended Neuro/Psych:  Normal mood and affect.  Skin:  Warm and dry.  Lymphatic:  No inguinal lymphadenopathy.   Pelvic exam: is not limited by body habitus EGBUS: within normal limits, Vagina: within normal limits and with no blood or discharge in the vault, Cervix: normal appearing cervix without tenderness, discharge or lesions. Uterus:  nonenlarged and non tender and Adnexa:  normal adnexa and no mass, fullness, tenderness Rectovaginal: deferred  Laboratory: as per HPI  Radiology:  CLINICAL DATA:  Right lower quadrant and left lower quadrant pain for 1 day.  EXAM: TRANSABDOMINAL AND TRANSVAGINAL ULTRASOUND OF PELVIS  DOPPLER ULTRASOUND OF OVARIES  TECHNIQUE: Both transabdominal and transvaginal ultrasound examinations of the pelvis were performed. Transabdominal technique was performed for global  imaging of the pelvis including uterus, ovaries, adnexal regions, and pelvic cul-de-sac.  It was necessary to proceed with endovaginal exam following the transabdominal exam to visualize the left and right adnexa. Color and duplex Doppler ultrasound was utilized to evaluate blood flow to the ovaries.  COMPARISON:  CT scan same date  FINDINGS: Uterus  Measurements: 7.3 x 3.3 x 3.7 cm = volume: 44 mL. No fibroids or other mass visualized.  Endometrium  Thickness: 12 mm.  No focal abnormality visualized.  Right ovary  Measurements: 3.5 x 1.7 x 2 cm = volume: 6.3 mL. Normal appearance/no adnexal mass.  Left ovary  Measurements: 5.4 x 5.1 x 4.5 cm = volume: 65 mL. Within the left ovary there is a well-circumscribed rounded structure with lace-like areas of echogenicity and enhanced through transmission. This area measures 3.9 x 4 x 4.1 (volume = 30) cm.  Pulsed Doppler evaluation of both ovaries demonstrates normal low-resistance arterial and venous waveforms.  Other findings  Small free fluid in the pelvis.  IMPRESSION: 1. Hemorrhagic left ovarian cyst, moderately large as described. 2. No signs of ovarian torsion.   Electronically Signed   By: Zetta Bills M.D.   On: 07/13/2019 17:09  CLINICAL DATA:  Lower abdominal pain, nausea and vomiting since 10 a.m. today.  EXAM: CT ABDOMEN AND PELVIS WITH CONTRAST  TECHNIQUE: Multidetector CT imaging of the abdomen and pelvis was performed using the standard protocol following bolus administration of intravenous contrast.  CONTRAST:  134mL OMNIPAQUE IOHEXOL 300 MG/ML  SOLN  COMPARISON:  09/23/2018  FINDINGS: Lower chest: Unremarkable.  Hepatobiliary: Focal fat deposition adjacent to the falciform ligament. Mild diffuse low density of the liver relative to the spleen as well. Normal appearing gallbladder.  Pancreas: Unremarkable. No pancreatic ductal dilatation or surrounding  inflammatory changes.  Spleen: Normal in size without focal abnormality.  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 Normal appearing adrenal glands, left kidney, ureters and urinary bladder.  Stomach/Bowel: Unremarkable stomach, small bowel and colon. No evidence of appendicitis.  Vascular/Lymphatic: No significant vascular findings are present. No enlarged abdominal or pelvic lymph nodes.  Reproductive: The previously demonstrated bilateral hydrosalpinges are no longer seen. A left ovarian resolving corpus luteum is noted. There is also a 4.4 cm left ovarian cyst, with possible mild soft tissue components inferiorly. Unremarkable uterus.  Other: Small amount of free peritoneal fluid in the pelvic cul-de-sac, within normal limits of physiological fluid.  Musculoskeletal: Previously noted pelvic fixation hardware. Bilateral L5 pars interarticularis defects with associated grade 1 anterolisthesis at the L5-S1 level. Mild anterior spur formation at that level.  IMPRESSION: 1. 4.4 cm left ovarian cyst with possible mild soft tissue components inferiorly. Further evaluation with a pelvic ultrasound is recommended. 2. Mild diffuse hepatic steatosis. 3. 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   Electronically Signed   By: Claudie Revering M.D.   On: 07/13/2019 15:18   Assessment: pt stable  Plan:  1. Cervical cancer screening - Cytology - PAP( [facility])  2. Tobacco abuse  3. Hemorrhagic cyst of left ovary D/w her re: repeating u/s in a few weeks and following up based on imaging results, which she is amenable to. I d/w her that hemorrhagic cysts can take time to fully resolve, expectations given  RTC after u/s  Durene Romans MD Attending Center for [facility])

## 2019-08-14 LAB — CYTOLOGY - PAP
Chlamydia: NEGATIVE
Comment: NEGATIVE
Comment: NEGATIVE
Comment: NEGATIVE
Comment: NORMAL
Diagnosis: NEGATIVE
HPV 16: NEGATIVE
HPV 18 / 45: NEGATIVE
High risk HPV: POSITIVE — AB
Neisseria Gonorrhea: NEGATIVE
Trichomonas: NEGATIVE

## 2019-08-17 ENCOUNTER — Encounter: Payer: Self-pay | Admitting: Obstetrics and Gynecology

## 2019-08-17 DIAGNOSIS — R8789 Other abnormal findings in specimens from female genital organs: Secondary | ICD-10-CM | POA: Insufficient documentation

## 2019-08-17 DIAGNOSIS — R87618 Other abnormal cytological findings on specimens from cervix uteri: Secondary | ICD-10-CM | POA: Insufficient documentation

## 2019-09-15 ENCOUNTER — Ambulatory Visit (HOSPITAL_COMMUNITY)
Admission: RE | Admit: 2019-09-15 | Discharge: 2019-09-15 | Disposition: A | Discharge status: Home / Self Care | Payer: BLUE CROSS/BLUE SHIELD | Source: Ambulatory Visit | Attending: Obstetrics and Gynecology | Admitting: Obstetrics and Gynecology

## 2019-09-15 ENCOUNTER — Other Ambulatory Visit: Payer: Self-pay

## 2019-09-15 DIAGNOSIS — N83202 Unspecified ovarian cyst, left side: Secondary | ICD-10-CM | POA: Diagnosis not present

## 2019-09-17 ENCOUNTER — Telehealth (INDEPENDENT_AMBULATORY_CARE_PROVIDER_SITE_OTHER): Payer: BLUE CROSS/BLUE SHIELD | Admitting: Obstetrics and Gynecology

## 2019-09-17 ENCOUNTER — Other Ambulatory Visit: Payer: Self-pay

## 2019-09-17 DIAGNOSIS — Z8742 Personal history of other diseases of the female genital tract: Secondary | ICD-10-CM

## 2019-09-17 NOTE — Progress Notes (Signed)
I connected with  Morgan Donaldson on 09/17/19 at  3:55 PM EST by telephone and verified that I am speaking with the correct person using two identifiers.   I discussed the limitations, risks, security and privacy concerns of performing an evaluation and management service by telephone and the availability of in person appointments. I also discussed with the patient that there may be a patient responsible charge related to this service. The patient expressed understanding and agreed to proceed.  Ernestina Patches, CMA 09/17/2019  3:53 PM

## 2019-09-17 NOTE — Progress Notes (Signed)
TELEHEALTH VIRTUAL GYNECOLOGY VISIT ENCOUNTER NOTE  I connected with Morgan Donaldson on 09/17/19 at  3:55 PM EST by telephone at home and verified that I am speaking with the correct person using two identifiers.   I discussed the limitations, risks, security and privacy concerns of performing an evaluation and management service by telephone and the availability of in person appointments. I also discussed with the patient that there may be a patient responsible charge related to this service. The patient expressed understanding and agreed to proceed.   Primary Care Provider: Patient, No Pcp Per  OBGYN Clinic: [facility]  Chief Complaint: f/u u/s  History of Present Illness:  Morgan Donaldson is a 39 y.o. with above CC.   Patient initially seen by me on 12/14 for ED for f/u abdominal pain and 4cm LO hemorrhagic cyst with plan for f/u u/s. She had one on 1/18 that showed a 1.5cm minimally complex LO cyst. She states she feels better than last visit.   Review of Systems: as noted in the History of Present Illness.  Medications:  Morgan Donaldson had no medications administered during this visit. Current Outpatient Medications  Medication Sig Dispense Refill  . acetaminophen (TYLENOL) 500 MG tablet Take 1,000 mg by mouth every 6 (six) hours as needed for mild pain.    Marland Kitchen albuterol (PROVENTIL HFA;VENTOLIN HFA) 108 (90 Base) MCG/ACT inhaler Inhale 1-2 puffs into the lungs every 6 (six) hours as needed for wheezing or shortness of breath. (Patient not taking: Reported on 07/13/2019) 1 Inhaler 0  . ibuprofen (ADVIL,MOTRIN) 800 MG tablet Take 1 tablet (800 mg total) by mouth 3 (three) times daily. (Patient not taking: Reported on 07/13/2019) 21 tablet 0  . naproxen (NAPROSYN) 500 MG tablet Take 1 tablet (500 mg total) by mouth 2 (two) times daily. (Patient not taking: Reported on 08/11/2019) 30 tablet 0   No current facility-administered medications for this visit.    Allergies:  has No Known Allergies.  Review of Systems:  Pertinent items noted in HPI and remainder of comprehensive ROS otherwise negative.  Physical Exam:   General:  Alert, oriented and cooperative.   Mental Status: Normal mood and affect perceived. Normal judgment and thought content.  Physical exam deferred due to nature of the encounter   Assessment: pt doing well  CLINICAL DATA:  39 year old female presents for follow-up of hemorrhagic left ovarian cyst. LMP 07/16/2019.  EXAM: ULTRASOUND PELVIS TRANSVAGINAL  TECHNIQUE: Transvaginal ultrasound examination of the pelvis was performed including evaluation of the uterus, ovaries, adnexal regions, and pelvic cul-de-sac.  COMPARISON:  07/13/2019 pelvic sonogram.  FINDINGS: Uterus  Measurements: 7.8 x 3.4 x 4.1 cm = volume: 56 mL. Anteverted uterus is normal in size and configuration, with no uterine fibroids or other myometrial abnormalities.  Endometrium  Thickness: 5 mm. No endometrial cavity fluid or focal endometrial mass.  Right ovary  Measurements: 4.1 x 2.2 x 2.1 cm = volume: 10.0 mL. Normal appearance/no adnexal mass.  Left ovary  Measurements: 3.7 x 2.4 x 3.1 cm = volume: 14.2 mL. Previously visualized 4.1 cm left ovarian hemorrhagic cyst has resolved. There is a 1.6 x 1.5 x 1.3 cm minimally complex left ovarian cyst on today's scan with thin internal septation. Otherwise no left ovarian or left adnexal masses.  Other findings:  No abnormal free fluid  IMPRESSION: 1. Left ovarian hemorrhagic cyst from 07/13/2019 scan has resolved. 2. New minimally complex 1.6 cm left ovarian cyst with solitary thin internal septation, for which  no follow-up is required. This recommendation follows the consensus statement: Management of Asymptomatic Ovarian and Other Adnexal Cysts Imaged at [place]: Society of Radiologists in [place]. Radiology 2010; [phone]. 3. Normal uterus and  right ovary.   Electronically Signed   By: Ilona Sorrel M.D.   On: 09/15/2019 15:48  Plan: D/w her that can try and do hormone therapy to try and suppress any future cysts and she is amenable to this and she decided on depo provera. Pt to come in for injection visit.   I discussed the assessment and treatment plan with the patient. The patient was provided an opportunity to ask questions and all were answered. The patient agreed with the plan and demonstrated an understanding of the instructions.   The patient was advised to call back or seek an in-person evaluation/go to the ED if the symptoms worsen or if the condition fails to improve as anticipated.  I provided 15 minutes of non-face-to-face time during this encounter. The visit was done via a Telephone visit.    Aletha Halim, MD [facility] for [place]

## 2019-11-03 ENCOUNTER — Ambulatory Visit: Payer: BC Managed Care – PPO | Admitting: Family Medicine

## 2019-11-03 ENCOUNTER — Other Ambulatory Visit: Payer: Self-pay

## 2019-11-03 VITALS — BP 94/59 | HR 80 | Wt 144.2 lb

## 2019-11-03 DIAGNOSIS — Z3042 Encounter for surveillance of injectable contraceptive: Secondary | ICD-10-CM

## 2019-11-03 DIAGNOSIS — Z3202 Encounter for pregnancy test, result negative: Secondary | ICD-10-CM | POA: Diagnosis not present

## 2019-11-03 LAB — POCT PREGNANCY, URINE: Preg Test, Ur: NEGATIVE

## 2019-11-03 MED ORDER — MEDROXYPROGESTERONE ACETATE 150 MG/ML IM SUSP
150.0000 mg | Freq: Once | INTRAMUSCULAR | Status: AC
  Administered 2019-11-03: 150 mg via INTRAMUSCULAR

## 2019-11-03 NOTE — Progress Notes (Signed)
Pt reports that she last had intercourse about a week ago.  Pt states that she started her period yesterday, 11/02/19.   Negative pregnancy test.  Per standing protocol pt can receive Depo Provera today.  [name] here for Depo-Provera  Injection.  Injection administered without complication. Patient will return in 3 months for next injection.  Ralene Bathe, RN 11/03/2019  8:48 AM

## 2019-11-03 NOTE — Progress Notes (Signed)
Chart reviewed for nurse visit. Agree with plan of care.   Malika Demario L, DO 11/03/2019 1:26 PM

## 2020-01-19 ENCOUNTER — Encounter: Payer: Self-pay | Admitting: *Deleted

## 2020-01-19 ENCOUNTER — Other Ambulatory Visit: Payer: Self-pay

## 2020-01-19 ENCOUNTER — Ambulatory Visit (INDEPENDENT_AMBULATORY_CARE_PROVIDER_SITE_OTHER): Payer: BC Managed Care – PPO | Admitting: *Deleted

## 2020-01-19 VITALS — BP 104/71 | HR 66 | Temp 98.4°F | Ht 67.0 in | Wt 154.0 lb

## 2020-01-19 DIAGNOSIS — Z3042 Encounter for surveillance of injectable contraceptive: Secondary | ICD-10-CM | POA: Diagnosis not present

## 2020-01-19 MED ORDER — MEDROXYPROGESTERONE ACETATE 150 MG/ML IM SUSP
150.0000 mg | Freq: Once | INTRAMUSCULAR | Status: AC
  Administered 2020-01-19: 150 mg via INTRAMUSCULAR

## 2020-01-19 NOTE — Progress Notes (Signed)
Depo Provera 150 mg administered as scheduled.  Pt tolerated well. Pt reports spotting for the last several weeks however her pelvic pain has stopped completely since first Depo injection. Next injection due 8/9-8/23.

## 2020-02-01 ENCOUNTER — Emergency Department (HOSPITAL_COMMUNITY)
Admission: EM | Admit: 2020-02-01 | Discharge: 2020-02-02 | Disposition: A | Discharge status: Home / Self Care | Payer: BC Managed Care – PPO | Attending: Emergency Medicine | Admitting: Emergency Medicine

## 2020-02-01 ENCOUNTER — Emergency Department (HOSPITAL_COMMUNITY): Payer: BC Managed Care – PPO

## 2020-02-01 ENCOUNTER — Other Ambulatory Visit: Payer: Self-pay

## 2020-02-01 ENCOUNTER — Encounter (HOSPITAL_COMMUNITY): Payer: Self-pay | Admitting: Emergency Medicine

## 2020-02-01 DIAGNOSIS — F17210 Nicotine dependence, cigarettes, uncomplicated: Secondary | ICD-10-CM | POA: Diagnosis not present

## 2020-02-01 DIAGNOSIS — W2209XA Striking against other stationary object, initial encounter: Secondary | ICD-10-CM | POA: Insufficient documentation

## 2020-02-01 DIAGNOSIS — Y929 Unspecified place or not applicable: Secondary | ICD-10-CM | POA: Diagnosis not present

## 2020-02-01 DIAGNOSIS — Y999 Unspecified external cause status: Secondary | ICD-10-CM | POA: Insufficient documentation

## 2020-02-01 DIAGNOSIS — Y939 Activity, unspecified: Secondary | ICD-10-CM | POA: Insufficient documentation

## 2020-02-01 DIAGNOSIS — S9032XA Contusion of left foot, initial encounter: Secondary | ICD-10-CM | POA: Insufficient documentation

## 2020-02-01 DIAGNOSIS — S99921A Unspecified injury of right foot, initial encounter: Secondary | ICD-10-CM | POA: Diagnosis present

## 2020-02-01 MED ORDER — ACETAMINOPHEN 500 MG PO TABS: 1000.0000 mg | ORAL_TABLET | Freq: Once | ORAL | Status: DC

## 2020-02-01 NOTE — Progress Notes (Signed)
Orthopedic Tech Progress Note Patient Details:  Hatice Bubel October 08, 1980 [id number]  Ortho Devices Type of Ortho Device: Crutches, Postop shoe/boot Ortho Device/Splint Location: lle post op shoe. applied by tevin. Ortho Device/Splint Interventions: Ordered, Application, Adjustment   Post Interventions Patient Tolerated: Well Instructions Provided: Care of device, Adjustment of device   Trinna Post 02/01/2020, 7:13 PM

## 2020-02-01 NOTE — ED Provider Notes (Signed)
[facility] Provider Note   CSN: [id number] Arrival date & time: 02/01/20  1548     History Chief Complaint  Patient presents with  . Foot Pain    Morgan Donaldson is a 39 y.o. female.  Patient presents with left foot injury.  Patient kicked a blender and has pain constant worse with movement.  No other injuries.  No significant medical history.        Past Medical History:  Diagnosis Date  . Bronchitis   . Hydrosalpinx    bilateral  . Palpitations   . Physiological ovarian cysts     Patient Active Problem List   Diagnosis Date Noted  . Pap smear abnormality of cervix/human papillomavirus (HPV) positive 08/17/2019  . Hemorrhagic cyst of left ovary 08/11/2019  . Tobacco abuse 05/19/2019  . 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 ORIF PELVIC FRACTURE       OB History    Gravida  0   Para  0   Term  0   Preterm  0   AB  0   Living  0     SAB  0   TAB  0   Ectopic  0   Multiple  0   Live Births  0           Family History  Problem Relation Age of Onset  . Lung cancer Mother        died at age 88  . CAD Mother        MI at age 38  . Hypertension Mother   . Hypertension Sister   . Hyperlipidemia Sister   . COPD Sister   . Healthy Brother   . Healthy Sister     Social History   Tobacco Use  . Smoking status: Current Every Day Smoker    Packs/day: 0.50    Types: Cigarettes  . Smokeless tobacco: Never Used  Substance Use Topics  . Alcohol use: No  . Drug use: Yes    Types: Marijuana    Home Medications Prior to Admission medications   Medication Sig Start Date End Date Taking? Authorizing Provider  acetaminophen (TYLENOL) 500 MG tablet Take 1,000 mg by mouth every 6 (six) hours as needed for mild pain.    [provider]  albuterol (PROVENTIL HFA;VENTOLIN HFA) 108 (90 Base) MCG/ACT inhaler Inhale 1-2 puffs into  the lungs every 6 (six) hours as needed for wheezing or shortness of breath. Patient not taking: Reported on 07/13/2019 05/20/18   Hedges, Dellis Filbert, PA-C  ibuprofen (ADVIL,MOTRIN) 800 MG tablet Take 1 tablet (800 mg total) by mouth 3 (three) times daily. Patient not taking: Reported on 07/13/2019 12/26/16   Mackuen, Courteney Lyn, MD  naproxen (NAPROSYN) 500 MG tablet Take 1 tablet (500 mg total) by mouth 2 (two) times daily. Patient not taking: Reported on 08/11/2019 07/13/19   Maudie Flakes, MD    Allergies    Patient has no known allergies.  Review of Systems   Review of Systems  Physical Exam Updated Vital Signs BP (!) 115/59 (BP Location: Right Arm)   Pulse 77   Temp 99.2 F (37.3 C) (Oral)   Resp 14   Ht 5\' 7"  (1.702 m)   Wt 70.3 kg   SpO2 99%   BMI 24.28 kg/m   Physical Exam Vitals and nursing note reviewed.  HENT:  Head: Normocephalic and atraumatic.  Cardiovascular:     Rate and Rhythm: Normal rate.  Abdominal:     General: There is no distension.  Musculoskeletal:        General: Swelling and tenderness present.     Comments:  patient has tenderness mild swelling left midfoot dorsal aspect, no step off, no ankle or leg tenderness  Skin:    General: Skin is warm.     Capillary Refill: Capillary refill takes less than 2 seconds.  Neurological:     General: No focal deficit present.     Mental Status: She is alert.  Psychiatric:        Mood and Affect: Mood normal.     ED Results / Procedures / Treatments   Labs (all labs ordered are listed, but only abnormal results are displayed) Labs Reviewed - No data to display  EKG None  Radiology DG Foot Complete Left  Result Date: 02/01/2020 CLINICAL DATA:  Status post trauma. EXAM: LEFT FOOT - COMPLETE 3+ VIEW COMPARISON:  None. FINDINGS: There is no evidence of fracture or dislocation. There is no evidence of arthropathy or other focal bone abnormality. Soft tissues are unremarkable. IMPRESSION: Negative.  Electronically Signed   By: Aram Candela M.D.   On: 02/01/2020 16:27    Procedures Procedures (including critical care time)  Medications Ordered in ED Medications  acetaminophen (TYLENOL) tablet 1,000 mg (has no administration in time range)    ED Course  I have reviewed the triage vital signs and the nursing notes.  Pertinent labs & imaging results that were available during my care of the patient were reviewed by me and considered in my medical decision making (see chart for details).    MDM Rules/Calculators/A&P                      Patient presents with isolated left foot injury.  X-ray reviewed no fracture.  Discussed with Ortho tech for crutches, nurse to provide Ace wrap, pain meds given.  Supportive care discussed. Final Clinical Impression(s) / ED Diagnoses Final diagnoses:  Contusion of left foot, initial encounter    Rx / DC Orders ED Discharge Orders    None       Blane Ohara, MD 02/01/20 [phone]

## 2020-02-01 NOTE — ED Triage Notes (Signed)
Patient arrives to ED with complaints of left foot pain after kicking a blender after it fell on her countertop.

## 2020-02-01 NOTE — Discharge Instructions (Addendum)
Gradually increase weightbearing as tolerated.  Use Tylenol and ice for pain.  See a physician if no improvement in 1 week.

## 2020-04-05 ENCOUNTER — Encounter: Payer: Self-pay | Admitting: Obstetrics and Gynecology

## 2020-04-05 ENCOUNTER — Other Ambulatory Visit: Payer: Self-pay

## 2020-04-05 ENCOUNTER — Ambulatory Visit (INDEPENDENT_AMBULATORY_CARE_PROVIDER_SITE_OTHER): Payer: BC Managed Care – PPO | Admitting: *Deleted

## 2020-04-05 VITALS — BP 109/58 | HR 69 | Ht 67.0 in | Wt 162.6 lb

## 2020-04-05 DIAGNOSIS — Z659 Problem related to unspecified psychosocial circumstances: Secondary | ICD-10-CM

## 2020-04-05 DIAGNOSIS — Z3042 Encounter for surveillance of injectable contraceptive: Secondary | ICD-10-CM | POA: Diagnosis not present

## 2020-04-05 MED ORDER — MEDROXYPROGESTERONE ACETATE 150 MG/ML IM SUSP
150.0000 mg | Freq: Once | INTRAMUSCULAR | Status: AC
  Administered 2020-04-05: 150 mg via INTRAMUSCULAR

## 2020-04-05 NOTE — Patient Instructions (Signed)
[place] [facility]  Department of Social Services[place] [address], [place], [place] [address] [phone]   or  www.[place].gov/our-county/human-services/social-services **SNAP/EBT/ Other nutritional benefits  [place] DHHS-Public Health-WIC [address], [place], [place] [address] [phone]  or  https://[place]nc.gov/our-county/human-services/health-department **WIC for  women who are pregnant and postpartum, infants and children up to 5 years old  [facility] [address], [place], [place] [address] [phone]   or   www.theblessedtable.org  **Food pantry  [facility] [address], [place], [place] [address] [phone]   or   https://brotherkolbes.godaddysites.com  **Emergency food and prepared meals  [place] [facility] [address], [place], [place] [address] [phone]   or   www.cedargrovetop.us **Food pantry  [facility] Food [facility] [address], [place], [place] [address] [phone]   or   www.facebook.com/[facility]-116430931718202 **Food pantry  [facility] [address], [place], [place] [address] [phone] **Food pantry  [facility] [address], [place], [place] [address] [phone]   or   www.greensborourbanministry.org  **Food pantry and prepared meals  [facility] [address], [place], [place] [address] https://jfsgreensboro.org/  **Food pantry  [facility] [address], [place], [place] [address] [phone]) [phone]   or   www.lbcnow.org  **Food pantry  [facility] [address], [place], [place] [address] [phone]) [phone]   or   http://www.onestepfurther.com **Food pantry, nutrition education, gardening activities  [facility] [address], [place], [place] [address] [phone] **Food pantry  [facility] [place] [address], [place], [place] [address] [phone]) [phone]   or   www.salvationarmyofgreensboro.org **Food pantry  [facility] [place] [address], [place], [place] [address] [phone]) [phone]   or   http://senior-resources-[place]il[place].org **Meals on Wheels Program  [facility] [address], [place], [place] [address] [phone]) [phone]   or   www.stmattchurch.com  **Food pantry  [facility] Food [facility] [address], , [place] [address] [phone]   or   vandaliapresbyterianchurch.org **Food pantry  [place] [facility]  [place] [facility] Food [facility] [address], [place], [place] [address] [phone]) [phone]   or   www.facebook.com/JulianUMC Food pantry  [facility] [address], [place], [place] [address] [phone]) [phone] **Food pantry  [place] [facility]   Department of Public Health[place] [address], [place], [place] [address] [phone]) [phone]   or   www.co.[place].us/ph/  [place] DHHS-Public Health-WIC ([place]) [address], [place], [place] [address] [phone]) [phone]   or   https://www.[place]nc.gov/our-county/human-services/health-department **WIC for pregnant and postpartum women, infants and children up to 5 years old  [facility] [address], [place], [place] [address] [phone]) [phone] **Food pantry  [facility] [address] [place] [address], [place], [place] [address] [phone]   or   emerywoodbaptistchurch.com *Food pantry  [employer] [facility] [address], [place], [place] [address] [phone]   or   www.fcchighpoint.org **Food pantry  [facility] [address], [place],  [address] [phone]) [phone]   or    www.helpinghandshp.org **Food pantry  [facility] Food [facility] [address], [place],  [address] [phone]) [phone]   or   www.facebook.com/KBCI1 **Food Pantry  [facility] [address]   [address], [place], [place] [address] [phone]   or   www.abbottscreek.org [employer] [facility] [place] [phone]   **Delivers meals  [facility] [address], [place], [place] [address] [phone]   or   nbfgm.sundaystreamwebsites.com  Programme researcher, broadcasting/film/video of [employer] [address], [place], [place] [address] [phone]   or   www.odm-hp.org  **Food pantry  [place] [address], [place], [place] [address] [phone]   or   R2live.tv **Food pantry  [place] [address], [place], [place] [address] [phone]   or   ClubMonetize.fr **Emergency food and pet food  [facility] [place] [address] [place], [place] [address] [phone]   or   www.senioradults.org **Congregate and delivered meals to older adults  [place] [facility] [employer] [address], [place], [place] [address] [phone]   or   SamedayNews.com.cy **Back Pack Program for elementary school students  [employer] [address] [place], [place] [address] [phone]   or   www.wardstreetcommunityresources.org **Food pantry  [facility] [address] [place], [place] [address] [phone]   or   https://www.carlson.net/ **Emergency food, nutrition classes, food budgeting  Food Resources [place]  [address] [place], [place], [place] [address] [phone]  or   www.co.[place].us/pview.aspx?id=14850&catid=407 **SNAP/Other nutrition benefits  [place], [place], [place] [address] [phone]   or  http://goodwin-walker.biz/  **SNAP/Other nutrition benefits  [place], [place], [place] [address] [phone]  or  http://www.rockinghamcountypublichealth.org **WIC for pregnant and postpartum women, infants and children up to 52 years old  Aging, Disability and [place] [address], [place], [place] [address] [phone]  or www.risodejaneiro.com **Prepared meals for older adults  [place] [address], [place], [place] [address] [phone]  or  NetworkingMixer.com.ee **Food pantry  [facility] [address] [place], [place] [address] [phone]   or   https://www.handsofgod.org/  Insurance underwriter  [facility] [place] [address], [place], [place] [address] [phone] **Food pantry  [facility] for [place], [place], [place] [address] [phone]   or   www.ci.[place].us/government/parks_and_recreation/senior_center/index.php **Congregate meal for older adults  [facility] [address] [place], [place] [address] [phone]   or www.reidsvilleoutreachcenter.org  **Food pantry  [facility] [address] [place], [place] [address] [phone]   or   http://reid-chang.com/ Building surveyor Resources [place], [place],  [address] https://www.[place]-.gov/departments/transportation/gdot-divisions/-transit-agency-public-transportation-division     . Fixed-route bus services, including regional fare cards for PART, [place], [place], and WSTA buses.  . Reduced fare bus ID's available for Medicaid, Medicare, and "orange card"  recipients.  [place] Kitchen SCAT offers curb-to-curb and door-to-door bus services for people with disabilities who are unable to use a fixed-bus route; also offers a shared-ride program.   Helpful tips:  -Routes available online and physical maps available at the main bus hub lobby (each for a specific route) -Smartphone directions often include bus routes (see the "bus" icon, next to the "car" and "walk" icons) -Routes differ on weekends, evenings and holidays, so  plan ahead!  -If you have Medicaid, Medicare, or orange card, plan to obtain a reduced-fare ID to save 50% on rides. Check days and times to obtain an ID, and bring all necessary documents.   [employer] System [place]) [address] [place], [place]. [address] [place], [place] [address] [phone] SpotApps.nl **Fixed-bus route services, and demand response bus service for older adults  Department of Social [facility] [address] [place], [place] [address] [phone] www.MysterySinger.com.cy **Medicaid transportation is available to [facility] recipients who need assistance getting to [facility] medical appointments and providers  [facility] [address] [place] [address], [place], [place] [address] www.cjmedicaltransportation.com  ** Offers non-emergency transportation for medical appointments  Wheels [address] [place], [place] [address] [phone] www.wheels4hope.org **REFERRAL NEEDED by specific agencies (see website), after meeting specified criteria only  [employer] for [employer]) [address] [place], [place] [address] [phone]  BuyingShow.es  *Regional fixed-bus routes between counties (example: [place] to [place]) and [employer] of [place]  [address], [place], [place] [address] [phone] Http://senior-resources-[place].org  Museum/gallery exhibitions officer available (call or see website for details)  [facility] [address], [place], [place] [address] [phone] www.senioradults.org  **Ride coordination    Transportation Services [facility]  Aging, Disability and Transit [facility] [address], [place], [place] [address] [phone] www.adtsrc.[facility] Department of Health and [facility] [address], [place], [place] [address] [phone]  FutureSponsors.be  **Transportation expense assistance  Department of Social [place] [address], [place], [place] [address] [phone] www.co.[place].us/pview.aspx?id=[email or web address]&catid=407  **Medicaid transportation for recipients who need assistance getting to [facility] medical appointments and providers

## 2020-04-05 NOTE — Progress Notes (Signed)
Here for depo-provera. Would like to see [facility] and discuss refererral to psychiatrist - states needs to talk with someone. I explained at checkout she can make appointment with our [facility].  States she bleeds everyday for 6 months- somedays light, some medium , some heavy. I explained at checkout today she can make appt with any provider to discuss options .  Also noted she does have transportation issues sometimes and food insecurity. Resources placed in AVS.  Depo-provera given without complaint. Will make next appointment at checkout. Annalisa Colonna,RN

## 2020-04-06 NOTE — Progress Notes (Signed)
Patient was assessed and managed by nursing staff during this encounter. I have reviewed the chart and agree with the documentation and plan. I have also made any necessary editorial changes.  Aryani Daffern, MD 04/06/2020 7:37 AM   

## 2020-04-12 ENCOUNTER — Other Ambulatory Visit: Payer: Self-pay

## 2020-04-12 ENCOUNTER — Ambulatory Visit (INDEPENDENT_AMBULATORY_CARE_PROVIDER_SITE_OTHER): Payer: BC Managed Care – PPO | Admitting: Clinical

## 2020-04-12 DIAGNOSIS — F4323 Adjustment disorder with mixed anxiety and depressed mood: Secondary | ICD-10-CM | POA: Diagnosis not present

## 2020-04-12 DIAGNOSIS — Z658 Other specified problems related to psychosocial circumstances: Secondary | ICD-10-CM | POA: Diagnosis not present

## 2020-04-12 NOTE — Patient Instructions (Signed)
[facility] [address] [place], [place] [address] [phone] (main office) [phone] (Orenthal Debski's office)  /Emotional Wellbeing Apps and Websites Here are a few free apps meant to help you to help yourself.  To find, try searching on the internet to see if the app is offered on Apple/Android devices. If your first choice doesn't come up on your device, the good news is that there are many choices! Play around with different apps to see which ones are helpful to you.    Calm This is an app meant to help increase calm feelings. Includes info, strategies, and tools for tracking your feelings.      Calm Harm  This app is meant to help with self-harm. Provides many 5-minute or 15-min coping strategies for doing instead of hurting yourself.       Healthy Minds Health Minds is a problem-solving tool to help deal with emotions and cope with stress you encounter wherever you are.      MindShift This app can help people cope with anxiety. Rather than trying to avoid anxiety, you can make an important shift and face it.      MY3  MY3 features a support system, safety plan and resources with the goal of offering a tool to use in a time of need.       My Life My Voice  This mood journal offers a simple solution for tracking your thoughts, feelings and moods. Animated emoticons can help identify your mood.       Relax Melodies Designed to help with sleep, on this app you can mix sounds and meditations for relaxation.      Smiling Mind Smiling Mind is meditation made easy: it's a simple tool that helps put a smile on your mind.        Stop, Breathe & Think  A friendly, simple guide for people through meditations for mindfulness and compassion.  Stop, Breathe and Think Kids Enter your current feelings and choose a "mission" to help you cope. Offers videos for certain moods instead of just sound recordings.       Team  Orange The goal of this tool is to help teens change how they think, act, and react. This app helps you focus on your own good feelings and experiences.      The Virtual Hope Box The Virtual Hope Box (VHB) contains simple tools to help patients with coping, relaxation, distraction, and positive thinking.    Coping with Panic Attacks   What is a panic attack?  You may have had a panic attack if you experienced four or more of the symptoms listed below coming on abruptly and peaking in about 10 minutes.  Panic Symptoms   . Pounding heart  . Sweating  . Trembling or shaking  . Shortness of breath  . Feeling of choking  . Chest pain  . Nausea or abdominal distress    . Feeling dizzy, unsteady, lightheaded, or faint  . Feelings of unreality or being detached from yourself  . Fear of losing control or going crazy  . Fear of dying  . Numbness or tingling  . Chills or hot flashes      Panic attacks are sometimes accompanied by avoidance of certain places or situations. These are often situations that would be difficult to escape from or in which help might not be available. Examples might include crowded shopping malls, public transportation, restaurants, or driving.   Why   do panic attacks occur?   Panic attacks are the body's alarm system gone awry. All of us have a built-in alarm system, powered by adrenaline, which increases our heart rate, breathing, and blood flow in response to danger. Ordinarily, this 'danger response system' works well. In some people, however, the response is either out of proportion to whatever stress is going on, or may come out of the blue without any stress at all.   For example, if you are walking in the woods and see a bear coming your way, a variety of changes occur in your body to prepare you to either fight the danger or flee from the situation. Your heart rate will increase to get more blood flow around your body, your breathing rate will quicken so  that more oxygen is available, and your muscles will tighten in order to be ready to fight or run. You may feel nauseated as blood flow leaves your stomach area and moves into your limbs. These bodily changes are all essential to helping you survive the dangerous situation. After the danger has passed, your body functions will begin to go back to normal. This is because your body also has a system for "recovering" by bringing your body back down to a normal state when the danger is over.   As you can see, the emergency response system is adaptive when there is, in fact, a "true" or "real" danger (e.g., bear). However, sometimes people find that their emergency response system is triggered in "everyday" situations where there really is no true physical danger (e.g., in a meeting, in the grocery store, while driving in normal traffic, etc.).   What triggers a panic attack?  Sometimes particularly stressful situations can trigger a panic attack. For example, an argument with your spouse or stressors at work can cause a stress response (activating the emergency response system) because you perceive it as threatening or overwhelming, even if there is no direct risk to your survival.  Sometimes panic attacks don't seem to be triggered by anything in particular- they may "come out of the blue". Somehow, the natural "fight or flight" emergency response system has gotten activated when there is no real danger. Why does the body go into "emergency mode" when there is no real danger?   Often, people with panic attacks are frightened or alarmed by the physical sensations of the emergency response system. First, unexpected physical sensations are experienced (tightness in your chest or some shortness of breath). This then leads to feeling fearful or alarmed by these symptoms ("Something's wrong!", "Am I having a heart attack?", "Am I going to faint?") The mind perceives that there is a danger even though no real danger  exists. This, in turn, activates the emergency response system ("fight or flight"), leading to a "full blown" panic attack. In summary, panic attacks occur when we misinterpret physical symptoms as signs of impending death, craziness, loss of control, embarrassment, or fear of fear. Sometimes you may be aware of thoughts of danger that activate the emergency response system (for example, thinking "I'm having a heart attack" when you feel chest pressure or increased heart rate). At other times, however, you may not be aware of such thoughts. After several incidences of being afraid of physical sensations, anxiety and panic can occur in response to the initial sensations without conscious thoughts of danger. Instead, you just feel afraid or alarmed. In other words, the panic or fear may seem to occur "automatically" without you consciously telling yourself   anything.   After having had one or more panic attacks, you may also become more focused on what is going on inside your body. You may scan your body and be more vigilant about noticing any symptoms that might signal the start of a panic attack. This makes it easier for panic attacks to happen again because you pick up on sensations you might otherwise not have noticed, and misinterpret them as something dangerous. A panic attack may then result.      How do I cope with panic attacks?  An important part of overcoming panic attacks involves re-interpreting your body's physical reactions and teaching yourself ways to decrease the physical arousal. This can be done through practicing the cognitive and behavioral interventions below.   Research has found that over half of people who have panic attacks show some signs of hyperventilation or overbreathing. This can produce initial sensations that alarm you and lead to a panic attack. Overbreathing can also develop as part of the panic attack and make the symptoms worse. When people hyperventilate, certain blood  vessels in the body become narrower. In particular, the brain may get slightly less oxygen. This can lead to the symptoms of dizziness, confusion, and lightheadedness that often occur during panic attacks. Other parts of the body may also get a bit less oxygen, which may lead to numbness or tingling in the hands or feet or the sensation of cold, clammy hands. It also may lead the heart to pump harder. Although these symptoms may be frightening and feel unpleasant, it is important to remember that hyperventilating is not dangerous. However, you can help overcome the unpleasantness of overbreathing by practicing Breathing Retraining.   Practice this basic technique three times a day, every day:  . Inhale. With your shoulders relaxed, inhale as slowly and deeply as you can while you count to six. If you can, use your diaphragm to fill your lungs with air.  . Hold. Keep the air in your lungs as you slowly count to four.  . Exhale. Slowly breath out as you count to six.  . Repeat. Do the inhale-hold-exhale cycle several times. Each time you do it, exhale for longer counts.  Like any new skill, Breathing Retraining requires practice. Try practicing this skill twice a day for several minutes. Initially, do not try this technique in specific situations or when you become frightened or have a panic attack. Begin by practicing in a quiet environment to build up your skill level so that you can later use it in time of "emergency."   2. Decreasing Avoidance  Regardless of whether you can identify why you began having panic attacks or whether they seemed to come out of the blue, the places where you began having panic attacks often can become triggers themselves. It is not uncommon for individuals to begin to avoid the places where they have had panic attacks. Over time, the individual may begin to avoid more and more places, thereby decreasing their activities and often negatively impacting their quality of life. To  break the cycle of avoidance, it is important to first identify the places or situations that are being avoided, and then to do some "relearning."  To begin this intervention, first create a list of locations or situations that you tend to avoid. Then choose an avoided location or situation that you would like to target first. Now develop an "exposure hierarchy" for this situation or location. An "exposure hierarchy" is a list of actions that make   you feel anxious in this situation. Order these actions from least to most anxiety-producing. It is often helpful to have the first item on your hierarchy involve thinking or imagining part of the feared/avoided situation.   Here is an example of an exposure hierarchy for decreasing avoidance of the grocery store. Note how it is ordered from the least amount of anxiety (at the top) to the most anxiety (at the bottom):  . Think about going to the grocery store alone.  . Go to the grocery store with a friend or family member.  . Go to the grocery store alone to pick up a few small items (5-10 minutes in the store).  . Shopping for 10-20 minutes in the store alone.  . Doing the shopping for the week by myself (20-30 minutes in the store).   Your homework is to "expose" yourself to the lowest item on your hierarchy and use your breathing relaxation and coping statements (see below) to help you remain in the situation. Practice this several times during the upcoming week. Once you have mastered each item with minimal anxiety, move on to the next higher action on your list.   Cognitive Interventions  1. Identify your negative self-talk Anxious thoughts can increase anxiety symptoms and panic. The first step in changing anxious thinking is to identify your own negative, alarming self-talk. Some common alarming thoughts:  . I'm having a heart attack.            . I must be going crazy. . I think I'm dying. . People will think I'm crazy. . I'm going to pass our.   . Oh no- here it comes.  . I can't stand this.  . I've got to get out of here!  2. Use positive coping statements Changing or disrupting a pattern of anxious thoughts by replacing them with more calming or supportive statements can help to divert a panic attack. Some common helpful coping statements:  . This is not an emergency.  . I don't like feeling this way, but I can accept it.  . I can feel like this and still be okay.  . This has happened before, and I was okay. I'll be okay this time, too.  . I can be anxious and still deal with this situation.        

## 2020-04-12 NOTE — BH Specialist Note (Addendum)
Integrated Behavioral Health via Telemedicine Video (Caregility) Visit  04/12/2020 Morgan Donaldson [id number]  Number of Integrated Behavioral Health visits: 1 Session Start time: 2:51  Session End time: 3:50 Total time: 59 minutes  Referring Provider: [place] Bing, MD Type of Visit: Video Patient/Family location: Home [facility] Provider location: [facility] for [facility]  All persons participating in visit: Patient Morgan Donaldson and [facility] Hulda Marin   Discussed confidentiality: Yes   I connected with Kynleigh Artz  by a video enabled telemedicine application (Caregility) and verified that I am speaking with the correct person using two identifiers.    I discussed that engaging in this virtual visit, they consent to the provision of behavioral healthcare and the services will be billed under their insurance.   Patient and/or legal guardian expressed understanding and consented to virtual visit: Yes   PRESENTING CONCERNS: Patient and/or family reports the following symptoms/concerns: Pt states her primary concern today is feeling "overwhelmed, gasping for air, can't sleep, running thoughts, shooting pains with panic, exhausted", attributed to worry over job uncertainty, lost sibling relationships after loss of mother,  adjusting to finding new siblings; quit smoking.  Pt's goals are to feel happier with increased confidence.  Duration of problem: Increase in over 5 months; Severity of problem: moderately severe  STRENGTHS (Protective Factors/Coping Skills): Resilience, determination (quit smoking "cold [place]" after over 20 years of use), motivated for change; open to learning new self-coping strategies; hopeful outlook  GOALS ADDRESSED: Patient will: 1.  Reduce symptoms of: anxiety, depression and stress  2.  Increase knowledge and/or ability of: self-management skills  3.  Demonstrate ability to: Increase healthy adjustment to current life  circumstances, Increase motivation to adhere to plan of care and Continue healthy grieving over loss  INTERVENTIONS: Interventions utilized:  Mindfulness or Relaxation Training, Brief CBT and Psychoeducation and/or Health Education Standardized Assessments completed: PHQ/GAD given in past two weeks  ASSESSMENT: Patient currently experiencing Adjustment disorder with mixed anxiety and depression.   Patient may benefit from psychoeducation and brief therapeutic interventions regarding coping with symptoms of anxiety and depression, and life stress .  PLAN: 1. Follow up with behavioral health clinician on : Two weeks 2. Behavioral recommendations:  -CALM relaxation breathing exercise twice daily (morning; at bedtime with sleep sounds) -Begin Worry Time strategy, starting tomorrow; continue daily for two weeks -Accept referral to psychiatry -Read educational materials regarding coping with symptoms of anxiety with panic attacks -Continue listening to music daily (Think of music and humor as necessary daily medicine; schedule it into every single day) 3. Referral(s): Integrated [employer] (In Clinic)  I discussed the assessment and treatment plan with the patient and/or parent/guardian. They were provided an opportunity to ask questions and all were answered. They agreed with the plan and demonstrated an understanding of the instructions.   They were advised to call back or seek an in-person evaluation if the symptoms worsen or if the condition fails to improve as anticipated.   Confirmed patient's address: Yes  Confirmed patient's phone number: Yes  Any changes to demographics: No   Confirmed patient's insurance: Yes  Any changes to patient's insurance: No   I discussed the limitations of evaluation and management by telemedicine and the availability of in person appointments.  I discussed that the purpose of this visit is to provide behavioral health care while limiting  exposure to the novel coronavirus.   Discussed there is a possibility of technology failure and discussed alternative modes of  communication if that failure occurs.  Valetta Close Lanika Colgate  Depression screen [facility] 2/9 04/05/2020 01/19/2020  Decreased Interest 0 0  Down, Depressed, Hopeless 0 0  PHQ - 2 Score 0 0  Altered sleeping 1 0  Tired, decreased energy - 0  Change in appetite 0 0  Feeling bad or failure about yourself  0 0  Trouble concentrating 0 0  Moving slowly or fidgety/restless 0 0  Suicidal thoughts 0 0  PHQ-9 Score 1 0   GAD 7 : Generalized Anxiety Score 04/05/2020 01/19/2020  Nervous, Anxious, on Edge 2 0  Control/stop worrying 1 0  Worry too much - different things 1 0  Trouble relaxing 1 0  Restless 1 0  Easily annoyed or irritable 1 0  Afraid - awful might happen 1 0  Total GAD 7 Score 8 0

## 2020-04-20 NOTE — Addendum Note (Signed)
Addended by: Hulda Marin C on: 04/20/2020 08:39 AM   Modules accepted: Orders

## 2020-04-20 NOTE — BH Specialist Note (Signed)
Integrated Behavioral Health via Telemedicine Video (Caregility) Visit  04/20/2020 [name] [id number]  Number of Integrated Behavioral Health visits: 2 Session Start time: 2:45  Session End time: 3:57 Total time: 72 minutes  Referring Provider: [place] Bing, MD Type of Visit: Video Patient/Family location: Home [facility] Provider location: Center for [facility] at [facility]  All persons participating in visit: Patient [place] Donaldson and [facility] Hulda Marin    Discussed confidentiality: Yes   I connected with Wilna Pennie  by a video enabled telemedicine application (Caregility) and verified that I am speaking with the correct person using two identifiers.    I discussed that engaging in this virtual visit, they consent to the provision of behavioral healthcare and the services will be billed under their insurance.   Patient and/or legal guardian expressed understanding and consented to virtual visit: Yes   PRESENTING CONCERNS: Patient and/or family reports the following symptoms/concerns: Pt states her primary concern today is feeling disrespected in current relationship, and a desire for increased happiness and greater confidence; Pt worries about impulsivity (bought two birds this week), difficulty focusing(household clutter), financial debt (student loans), disliking her appearance (wants dental work done), and fear of the unknown. Duration of problem: Ongoing; Severity of problem: moderately severe  STRENGTHS (Protective Factors/Coping Skills): Resilience, determination/internal strength(quit smoking "cold [place]" after 20 years of use), motivated for change, open to implementing new coping strategies; hopeful outlook  GOALS ADDRESSED: Patient will: 1.  Reduce symptoms of: anxiety, depression and stress  2.  Increase knowledge and/or ability of: self-management skills  3.  Demonstrate ability to: Increase healthy adjustment to current life  circumstances  INTERVENTIONS: Interventions utilized:  Solution-Focused Strategies Standardized Assessments completed: Not given today  ASSESSMENT: Patient currently experiencing Adjustment disorder with mixed anxiety and depression.   Patient may benefit from continued psychoeducation and brief therapeutic interventions regarding coping with symptoms of anxiety, depression and life stress .  PLAN: 1. Follow up with behavioral health clinician on : Two weeks 2. Behavioral recommendations:  -Spend at least one hour/day on days off imagining your ideal life ten years from now(Where will you live? What will you look like? What kind of work will you be doing? Do you have a partner? Pets? What does your home look like?), and write down as many details as possible.  -Continue using daily self-coping strategies (CALM relaxation breathing exercise twice daily; listening to music) for as long as remains helpful -Continue with plan to work on hand-training parakeets (at least on days off work)  3. Referral(s): Integrated [employer] (In Clinic)  I discussed the assessment and treatment plan with the patient and/or parent/guardian. They were provided an opportunity to ask questions and all were answered. They agreed with the plan and demonstrated an understanding of the instructions.   They were advised to call back or seek an in-person evaluation if the symptoms worsen or if the condition fails to improve as anticipated.   Confirmed patient's address: Yes  Confirmed patient's phone number: Yes  Any changes to demographics: No   Confirmed patient's insurance: Yes  Any changes to patient's insurance: No   I discussed the limitations of evaluation and management by telemedicine and the availability of in person appointments.  I discussed that the purpose of this visit is to provide behavioral health care while limiting exposure to the novel coronavirus.   Discussed there is a  possibility of technology failure and discussed alternative modes of communication if that failure occurs.  Valetta Close Aleea Hendry  Depression screen [facility] 2/9 04/05/2020 01/19/2020  Decreased Interest 0 0  Down, Depressed, Hopeless 0 0  PHQ - 2 Score 0 0  Altered sleeping 1 0  Tired, decreased energy - 0  Change in appetite 0 0  Feeling bad or failure about yourself  0 0  Trouble concentrating 0 0  Moving slowly or fidgety/restless 0 0  Suicidal thoughts 0 0  PHQ-9 Score 1 0   GAD 7 : Generalized Anxiety Score 04/05/2020 01/19/2020  Nervous, Anxious, on Edge 2 0  Control/stop worrying 1 0  Worry too much - different things 1 0  Trouble relaxing 1 0  Restless 1 0  Easily annoyed or irritable 1 0  Afraid - awful might happen 1 0  Total GAD 7 Score 8 0

## 2020-04-26 ENCOUNTER — Ambulatory Visit (INDEPENDENT_AMBULATORY_CARE_PROVIDER_SITE_OTHER): Payer: BC Managed Care – PPO | Admitting: Clinical

## 2020-04-26 DIAGNOSIS — F4323 Adjustment disorder with mixed anxiety and depressed mood: Secondary | ICD-10-CM

## 2020-04-26 DIAGNOSIS — Z658 Other specified problems related to psychosocial circumstances: Secondary | ICD-10-CM

## 2020-04-26 NOTE — Patient Instructions (Signed)
[facility] [address] [place], [place] [address] [phone] (main office) [phone] (Aquan Kope's office)   

## 2020-05-10 ENCOUNTER — Ambulatory Visit: Payer: BC Managed Care – PPO | Admitting: Clinical

## 2020-05-10 NOTE — BH Specialist Note (Signed)
Pt requests to reschedule, due to work schedule conflict, to virtual visit on Wednesday, 05/19/20 at 3:45pm.

## 2020-05-12 ENCOUNTER — Telehealth (INDEPENDENT_AMBULATORY_CARE_PROVIDER_SITE_OTHER): Payer: BC Managed Care – PPO | Admitting: Obstetrics and Gynecology

## 2020-05-12 ENCOUNTER — Other Ambulatory Visit: Payer: Self-pay

## 2020-05-12 DIAGNOSIS — N921 Excessive and frequent menstruation with irregular cycle: Secondary | ICD-10-CM | POA: Diagnosis not present

## 2020-05-12 MED ORDER — TRANEXAMIC ACID 650 MG PO TABS: 1300.0000 mg | ORAL_TABLET | Freq: Three times a day (TID) | ORAL | 0 refills | Status: DC

## 2020-05-12 NOTE — Progress Notes (Signed)
I connected with  Morgan Donaldson on 05/12/20 at  8:15 AM EDT by telephone and verified that I am speaking with the correct person using two identifiers.   I discussed the limitations, risks, security and privacy concerns of performing an evaluation and management service by telephone and the availability of in person appointments. I also discussed with the patient that there may be a patient responsible charge related to this service. The patient expressed understanding and agreed to proceed.  Radene Knee, RN 05/12/2020  8:20 AM

## 2020-05-12 NOTE — Progress Notes (Signed)
TELEHEALTH VIRTUAL GYNECOLOGY VISIT ENCOUNTER NOTE  I connected with Morgan Donaldson on 05/12/20 at  8:15 AM EDT by telephone at home and verified that I am speaking with the correct person using two identifiers.   I discussed the limitations, risks, security and privacy concerns of performing an evaluation and management service by telephone and the availability of in person appointments. I also discussed with the patient that there may be a patient responsible charge related to this service. The patient expressed understanding and agreed to proceed.  Chief Complaint: AUB on depo provera  History:  Morgan Donaldson is a 39 y.o. G0 with the above CC. Patient initially seen by me late 2020 for ED f/u after visit for abdominal pain and dx with ovarian cysts.  Follow up visit in January showed resolved cysts and plan for depo provera for [facility] and to hopefully prevent future cysts. Options d/w her and she desired depo with first shot on 3/8, and next ones on5/24 and 8/9.  Prior to this her periods were qmonth and regular. Uterine anatomy on u/s was normal.   She states her periods went away until about 6 weeks after the 2nd depo shot and she started having occasional spotting and rare bleeding. The bleeding is dark, it's not qday, comes and goes, no pain. Last spotting was yesterday and it was light.  07/2019 pap NILM with HPV positive but HR negative.      Past Medical History:  Diagnosis Date  . Bronchitis   . Hydrosalpinx    bilateral  . Palpitations   . Physiological ovarian cysts    Past Surgical History:  Procedure Laterality Date  . ORIF PELVIC FRACTURE     The following portions of the patient's history were reviewed and updated as appropriate: allergies, current medications, past family history, past medical history, past social history, past surgical history and problem list.   Review of Systems:  Pertinent items noted in HPI and remainder of comprehensive ROS otherwise  negative.  Physical Exam:   General:  Alert, oriented and cooperative.   Mental Status: Normal mood and affect perceived. Normal judgment and thought content.  Physical exam deferred due to nature of the encounter  Labs and Imaging No results found for this or any previous visit (from the past 336 hour(s)). No results found.    Assessment and Plan:     1. Breakthrough bleeding on depo provera I told her that usually by this point AUB with depo is starting to resolve. She overall likes the depo and would like to continue. I told her I recommend pills to see if that can stop the AUB. She is not good with qday meds and I d/w her re: qday ocps for a month or lysteda and she prefers that so this was sent in.   RTC in October when she is due for next depo for follow up and pap       I discussed the assessment and treatment plan with the patient. The patient was provided an opportunity to ask questions and all were answered. The patient agreed with the plan and demonstrated an understanding of the instructions.   The patient was advised to call back or seek an in-person evaluation/go to the ED if the symptoms worsen or if the condition fails to improve as anticipated.  I provided 15 minutes of non-face-to-face time during this encounter. The visit was done via a MyChart visit.    Corona Bing, MD [facility], [place]

## 2020-05-19 ENCOUNTER — Other Ambulatory Visit: Payer: Self-pay

## 2020-05-19 ENCOUNTER — Ambulatory Visit (INDEPENDENT_AMBULATORY_CARE_PROVIDER_SITE_OTHER): Payer: BC Managed Care – PPO | Admitting: Clinical

## 2020-05-19 DIAGNOSIS — Z658 Other specified problems related to psychosocial circumstances: Secondary | ICD-10-CM | POA: Diagnosis not present

## 2020-05-19 DIAGNOSIS — F4323 Adjustment disorder with mixed anxiety and depressed mood: Secondary | ICD-10-CM

## 2020-05-19 NOTE — BH Specialist Note (Signed)
Integrated Behavioral Health via Telemedicine Video (Caregility) Visit  05/19/2020 Morgan Donaldson [id number]  Number of Integrated Behavioral Health visits: 3 Session Start time: 3:47  Session End time: 4:27 Total time: 40  minutes  Referring Provider: Cowgill Bing, MD Type of Service: Individual Patient/Family location: Home [facility] Provider location: [facility]  All persons participating in visit: Patient Morgan Donaldson and Morgan Donaldson   I connected with Morgan Donaldson  by a video enabled telemedicine application (Caregility) and verified that I am speaking with the correct person using two identifiers.   Discussed confidentiality: at previous visit  Confirmed demographics & insurance:  Yes   I discussed that engaging in this virtual visit, they consent to the provision of behavioral healthcare and the services will be billed under their insurance.   Patient and/or legal guardian expressed understanding and consented to virtual visit: Yes   PRESENTING CONCERNS: Patient and/or family reports the following symptoms/concerns: Pt states she has made the decision to go back to school Duration of problem: Ongoing; Severity of problem: moderate  STRENGTHS (Protective Factors/Coping Skills): Resilience, determination/internal strength, motivated for change, adhering to treatment plan; hopeful outlook  ASSESSMENT: Patient currently experiencing Adjustment disorder with mixed anxiety and depression and Psychosocial stress   GOALS ADDRESSED: Patient will: 1.  Reduce symptoms of: anxiety, depression and stress  2.  Demonstrate ability to: Increase healthy adjustment to current life circumstances   Progress of Goals: Ongoing  INTERVENTIONS: Interventions utilized:  Solution-Focused Strategies Standardized Assessments completed & reviewed: Not Needed   OUTCOME: Patient Response: Pt is actively working towards her goals and  agrees with treatment plan   PLAN: 1. Follow up with behavioral health clinician on : One month 2. Behavioral recommendations:  -Continue plan to talk to school advisor and financial aid at [employer]; continue plan to begin classes spring 2022 -Continue using daily self-coping strategies (relaxation breathing exercises, music, time with pets)  3. Referral(s): Integrated Morgan Donaldson (In Clinic)  I discussed the assessment and treatment plan with the patient and/or parent/guardian. They were provided an opportunity to ask questions and all were answered. They agreed with the plan and demonstrated an understanding of the instructions.   They were advised to call back or seek an in-person evaluation as appropriate.  I discussed that the purpose of this visit is to provide behavioral health care while limiting exposure to the novel coronavirus.  Discussed there is a possibility of technology failure and discussed alternative modes of communication if that failure occurs.  Morgan Donaldson

## 2020-06-09 NOTE — BH Specialist Note (Deleted)
Integrated Behavioral Health via Telemedicine Video (Caregility) Visit  06/09/2020 Parveen Freehling [id number]  Number of Integrated Behavioral Health visits: 4 Session Start time: 3:45***  Session End time: 4:15*** Total time: {IBH Total Time:21014050} minutes  Referring Provider: Herlong Bing, MD Type of Service: Individual, Family, *** Patient/Family location: Home [facility] Provider location: [facility] [employer] [facility]  All persons participating in visit: Patient *** and [facility] Calla Wedekind ***   I connected with Antania Hoefling and/or Yalissa Breeding's {family members:20773} by a video enabled telemedicine application (Caregility) and verified that I am speaking with the correct person using two identifiers.   Discussed confidentiality:  at previous visit  Confirmed demographics & insurance:  Yes   I discussed that engaging in this virtual visit, they consent to the provision of behavioral healthcare and the services will be billed under their insurance.   Patient and/or legal guardian expressed understanding and consented to virtual visit: Yes   PRESENTING CONCERNS: Patient and/or family reports the following symptoms/concerns: *** Duration of problem: ***; Severity of problem: {Mild/Moderate/Severe:20260}  STRENGTHS (Protective Factors/Coping Skills): {CHL AMB BH PROTECTIVE FACTORS/[phone]}  ASSESSMENT: Patient currently experiencing ***.    GOALS ADDRESSED: Patient will: 1.  Reduce symptoms of: {IBH Symptoms:21014056}  2.  Increase knowledge and/or ability of: {IBH Patient Tools:21014057}  3.  Demonstrate ability to: {IBH Goals:21014053}   Progress of Goals: {CHL AMB BH PROGRESS TOWARDS YTKZS:0109323557}  INTERVENTIONS: Interventions utilized:  {IBH Interventions:21014054} Standardized Assessments completed & reviewed: {IBH Screening Tools:21014051}   OUTCOME: Patient Response: ***   PLAN: 1. Follow up with  behavioral health clinician on : *** 2. Behavioral recommendations: *** 3. Referral(s): {IBH Referrals:21014055}  I discussed the assessment and treatment plan with the patient and/or parent/guardian. They were provided an opportunity to ask questions and all were answered. They agreed with the plan and demonstrated an understanding of the instructions.   They were advised to call back or seek an in-person evaluation as appropriate.  I discussed that the purpose of this visit is to provide behavioral health care while limiting exposure to the novel coronavirus.  Discussed there is a possibility of technology failure and discussed alternative modes of communication if that failure occurs.  Valetta Close Faris Coolman

## 2020-06-14 ENCOUNTER — Encounter: Payer: Self-pay | Admitting: Podiatry

## 2020-06-14 ENCOUNTER — Ambulatory Visit: Payer: BC Managed Care – PPO | Admitting: Podiatry

## 2020-06-14 ENCOUNTER — Ambulatory Visit (INDEPENDENT_AMBULATORY_CARE_PROVIDER_SITE_OTHER): Payer: BC Managed Care – PPO

## 2020-06-14 ENCOUNTER — Other Ambulatory Visit: Payer: Self-pay | Admitting: Podiatry

## 2020-06-14 ENCOUNTER — Other Ambulatory Visit: Payer: Self-pay

## 2020-06-14 DIAGNOSIS — M21612 Bunion of left foot: Secondary | ICD-10-CM

## 2020-06-14 DIAGNOSIS — M21611 Bunion of right foot: Secondary | ICD-10-CM

## 2020-06-14 DIAGNOSIS — M21622 Bunionette of left foot: Secondary | ICD-10-CM

## 2020-06-14 DIAGNOSIS — M21619 Bunion of unspecified foot: Secondary | ICD-10-CM

## 2020-06-14 MED ORDER — TRAMADOL HCL 50 MG PO TABS: 50.0000 mg | ORAL_TABLET | Freq: Two times a day (BID) | ORAL | 2 refills | Status: DC | PRN

## 2020-06-15 NOTE — Progress Notes (Signed)
Subjective:   Patient ID: Morgan Donaldson, female   DOB: 39 y.o.   MRN: [id number]   HPI Patient presents stating she has been having a lot of pain in her left over right bunion site and also on the outside of the left foot.  States that she has tried different modalities including wider shoes soaks and anti-inflammatories as needed without relief of pain and does have to wear steel toe shoes at work.  Patient smokes half pack per day and likes to be active   Review of Systems  All other systems reviewed and are negative.       Objective:  Physical Exam Vitals and nursing note reviewed.  Constitutional:      Appearance: She is well-developed.  Pulmonary:     Effort: Pulmonary effort is normal.  Musculoskeletal:        General: Normal range of motion.  Skin:    General: Skin is warm.  Neurological:     Mental Status: She is alert.     Neurovascular status was found to be intact muscle strength was found to be adequate range of motion within normal limits subtalar midtarsal joint.  Good range of motion first MPJ with a lot of redness around the first metatarsal head left over right and prominence of the fifth metatarsal head left over right with pain.  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  She states she cannot take the pain in her left anymore and is tried numerous conservative treatments and wants it fixed and at this point I did allow her to go over consent form explaining surgery what would be required and recommended distal osteotomy along with tailor's bunion metatarsal osteotomy.  I explained the procedures and risks and she is willing to accept this wants surgery and after extensive review see signed consent form understanding  procedures and the fact total recovery can take 6 months to 1 year and she will probably not return to her steel toe shoes for a minimum of 12 weeks.  She is amendable to all this and all questions answered.  I then went ahead dispensed air fracture walker that I want her to get used to prior to surgery and we discussed a light duty which she may be able to do in a shorter period of time.  Scheduled for outpatient surgery left foot for distal osteotomies  X-rays indicate that there is structural abnormality of both the first and fifth metatarsal left foot over right foot with metatarsus adductus also noted with accentuates the deformity

## 2020-06-21 ENCOUNTER — Encounter (HOSPITAL_COMMUNITY): Payer: Self-pay | Admitting: Psychiatry

## 2020-06-21 ENCOUNTER — Ambulatory Visit (INDEPENDENT_AMBULATORY_CARE_PROVIDER_SITE_OTHER): Payer: BC Managed Care – PPO | Admitting: *Deleted

## 2020-06-21 ENCOUNTER — Telehealth (INDEPENDENT_AMBULATORY_CARE_PROVIDER_SITE_OTHER): Payer: BC Managed Care – PPO | Admitting: Psychiatry

## 2020-06-21 ENCOUNTER — Other Ambulatory Visit: Payer: Self-pay

## 2020-06-21 VITALS — BP 114/55 | HR 69 | Ht 67.0 in | Wt 165.9 lb

## 2020-06-21 DIAGNOSIS — F902 Attention-deficit hyperactivity disorder, combined type: Secondary | ICD-10-CM

## 2020-06-21 DIAGNOSIS — Z3042 Encounter for surveillance of injectable contraceptive: Secondary | ICD-10-CM

## 2020-06-21 DIAGNOSIS — F411 Generalized anxiety disorder: Secondary | ICD-10-CM

## 2020-06-21 MED ORDER — TRAZODONE HCL 50 MG PO TABS: 50.0000 mg | ORAL_TABLET | Freq: Every evening | ORAL | 1 refills | Status: DC | PRN

## 2020-06-21 MED ORDER — FLUOXETINE HCL 10 MG PO CAPS: ORAL_CAPSULE | ORAL | 0 refills | Status: DC

## 2020-06-21 MED ORDER — BUPROPION HCL ER (XL) 150 MG PO TB24: 150.0000 mg | ORAL_TABLET | Freq: Every day | ORAL | 1 refills | Status: DC

## 2020-06-21 MED ORDER — MEDROXYPROGESTERONE ACETATE 150 MG/ML IM SUSP
150.0000 mg | Freq: Once | INTRAMUSCULAR | Status: AC
  Administered 2020-06-21: 150 mg via INTRAMUSCULAR

## 2020-06-21 NOTE — Progress Notes (Signed)
Depo Provera 150 mg IM administered as scheduled. Pt tolerated well. Next injection due 1/10-1/24/22. Pt was advised of next Annual Gyn exam w/Pap due 12/14 and pt will schedule appt upon check out. PHQ-9 screen reviewed - score 10. GAD -7 score - 10. Pt is established w/[facility] in this office and had revent visit on 9/22. Pt was offered food from food market due to indicating food insecurity on her questionnaire today. She declined and was advised that she may come to office any time during business hours to receive food if needed. Pt voiced understanding of all information and instructions given.
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
## 2020-06-22 NOTE — Progress Notes (Signed)
Patient was assessed and managed by nursing staff during this encounter. I have reviewed the chart and agree with the documentation and plan. I have also made any necessary editorial changes.  Cherre Robins, CNM 06/22/2020 9:06 AM

## 2020-06-25 ENCOUNTER — Telehealth: Payer: Self-pay

## 2020-06-25 NOTE — Telephone Encounter (Signed)
DOS 07/13/2020  [place] BUNIONECTOMY LT - 28296 METATARSAL OSTEOTOMY 5TH LT - 28308  [employer] EFFECTIVE DATE - 08/29/2019  PLAN DEDUCTIBLE - $1000.00 W/ $0.00 REMAINING OUT OF POCKET - $3900.00 W/ $2245.00 REMAINING COPAY $0.00 COINSURANCE - 20%  SPOKE TO SHAUNNA AT [employer], SHE STATED NO AUTH REQUIRED FOR CPT [phone] [place] [phone]. CALL REF # [id number]

## 2020-06-29 ENCOUNTER — Ambulatory Visit (HOSPITAL_COMMUNITY)
Admission: EM | Admit: 2020-06-29 | Discharge: 2020-06-29 | Disposition: A | Discharge status: Home / Self Care | Payer: BC Managed Care – PPO | Attending: Urgent Care | Admitting: Urgent Care

## 2020-06-29 ENCOUNTER — Encounter (HOSPITAL_COMMUNITY): Payer: Self-pay

## 2020-06-29 ENCOUNTER — Other Ambulatory Visit: Payer: Self-pay

## 2020-06-29 DIAGNOSIS — M79644 Pain in right finger(s): Secondary | ICD-10-CM | POA: Diagnosis not present

## 2020-06-29 DIAGNOSIS — Z23 Encounter for immunization: Secondary | ICD-10-CM | POA: Diagnosis not present

## 2020-06-29 DIAGNOSIS — S61216A Laceration without foreign body of right little finger without damage to nail, initial encounter: Secondary | ICD-10-CM

## 2020-06-29 MED ORDER — ACETAMINOPHEN 325 MG PO TABS
650.0000 mg | ORAL_TABLET | Freq: Once | ORAL | Status: AC
  Administered 2020-06-29: 650 mg via ORAL

## 2020-06-29 MED ORDER — ACETAMINOPHEN 325 MG PO TABS
ORAL_TABLET | ORAL | Status: AC
  Filled 2020-06-29: qty 2

## 2020-06-29 MED ORDER — TETANUS-DIPHTH-ACELL PERTUSSIS 5-2.5-18.5 LF-MCG/0.5 IM SUSY
PREFILLED_SYRINGE | INTRAMUSCULAR | Status: AC
  Filled 2020-06-29: qty 0.5

## 2020-06-29 MED ORDER — TETANUS-DIPHTH-ACELL PERTUSSIS 5-2.5-18.5 LF-MCG/0.5 IM SUSY
0.5000 mL | PREFILLED_SYRINGE | Freq: Once | INTRAMUSCULAR | Status: AC
  Administered 2020-06-29: 0.5 mL via INTRAMUSCULAR

## 2020-06-29 NOTE — ED Provider Notes (Addendum)
[facility]   MRN: [id number] DOB: 08/13/1981  Subjective:   Morgan Donaldson is a 39 y.o. female presenting for suffering an accidental laceration to her right fifth finger.  This happened from pair of scissors.  Last Tdap was in 2010.  Denies loss of range of motion, loss of sensation.  No current facility-administered medications for this encounter.  Current Outpatient Medications:  .  FLUoxetine (PROZAC) 10 MG capsule, Take 1 capsule (10 mg total) by mouth daily for 7 days, THEN 2 capsules (20 mg total) daily., Disp: 67 capsule, Rfl: 0 .  traMADol (ULTRAM) 50 MG tablet, Take 1 tablet (50 mg total) by mouth every 12 (twelve) hours as needed., Disp: 50 tablet, Rfl: 2 .  traZODone (DESYREL) 50 MG tablet, Take 1 tablet (50 mg total) by mouth at bedtime and may repeat dose one time if needed., Disp: 60 tablet, Rfl: 1 .  buPROPion (WELLBUTRIN XL) 150 MG 24 hr tablet, Take 1 tablet (150 mg total) by mouth daily., Disp: 30 tablet, Rfl: 1 .  Multiple Vitamin (MULTIVITAMIN WITH MINERALS) TABS tablet, Take 1 tablet by mouth daily., Disp: , Rfl:    No Known Allergies  Past Medical History:  Diagnosis Date  . Bronchitis   . Hydrosalpinx    bilateral  . Palpitations   . Physiological ovarian cysts      Past Surgical History:  Procedure Laterality Date  . ORIF PELVIC FRACTURE      Family History  Problem Relation Age of Onset  . Lung cancer Mother        died at age 35  . CAD Mother        MI at age 46  . Hypertension Mother   . Schizophrenia Father   . Hypertension Sister   . Hyperlipidemia Sister   . COPD Sister   . Healthy Brother   . Healthy Sister     Social History   Tobacco Use  . Smoking status: Current Every Day Smoker    Packs/day: 0.50    Types: Cigarettes  . Smokeless tobacco: Never Used  Substance Use Topics  . Alcohol use: No  . Drug use: Yes    Types: Marijuana    ROS   Objective:   Vitals: BP 114/71 (BP Location: Left Arm)    Pulse 77   Temp 98.1 F (36.7 C) (Oral)   Resp 16   LMP 06/18/2020 (Exact Date)   SpO2 97%   Physical Exam Constitutional:      General: She is not in acute distress.    Appearance: Normal appearance. She is well-developed. She is not ill-appearing, toxic-appearing or diaphoretic.  HENT:     Head: Normocephalic and atraumatic.     Nose: Nose normal.     Mouth/Throat:     Mouth: Mucous membranes are moist.     Pharynx: Oropharynx is clear.  Eyes:     General: No scleral icterus.    Extraocular Movements: Extraocular movements intact.     Pupils: Pupils are equal, round, and reactive to light.  Cardiovascular:     Rate and Rhythm: Normal rate.  Pulmonary:     Effort: Pulmonary effort is normal.  Musculoskeletal:       Hands:  Skin:    General: Skin is warm and dry.  Neurological:     General: No focal deficit present.     Mental Status: She is alert and oriented to person, place, and time.  Psychiatric:  Mood and Affect: Mood normal.        Behavior: Behavior normal.        Thought Content: Thought content normal.        Judgment: Judgment normal.     PROCEDURE NOTE: laceration repair Verbal consent obtained from patient.  Local anesthesia with 2cc Lidocaine 2% without epinephrine.  Wound explored for tendon, ligament damage. Wound scrubbed with soap and water and rinsed. Wound closed with #3 5-0 Prolene (simple interrupted) sutures.  Wound cleansed and dressed.  Tdap updated in clinic.  Assessment and Plan :   PDMP not reviewed this encounter.  1. Finger pain, right   2. Laceration of right little finger without foreign body without damage to nail, initial encounter     Laceration repaired successfully. Wound care reviewed. Return-to-clinic precautions discussed, patient verbalized understanding. Otherwise, follow up in 7 to 10 days for suture removal.      Wallis Bamberg, PA-C 06/29/20 1846

## 2020-06-29 NOTE — ED Triage Notes (Signed)
Pt c/o laceration to right 5th finger. Pt states she accidentally closed her finger in a pair of scissors today at approx 1430.  Approx 1cm long laceration to palmar side of right 5th finger noted. Small amount of bleeding upon bending the finger. Pt able to flex and extend finger fully. No OTC meds PTA.  Last tetanus was 2010.

## 2020-06-29 NOTE — Discharge Instructions (Signed)
WOUND CARE Please return in 7-10 days to have your stitches/staples removed or sooner if you have concerns.  Keep area clean and dry for 24 hours. Do not remove bandage, if applied.  After 24 hours, remove bandage and wash wound gently with mild soap and warm water. Reapply a new bandage after cleaning wound, if directed.  Continue daily cleansing with soap and water until stitches/staples are removed.  Do not apply any ointments or creams to the wound while stitches/staples are in place, as this may cause delayed healing.  Notify the office if you experience any of the following signs of infection: Swelling, redness, pus drainage, streaking, fever >101.0 F  Notify the office if you experience excessive bleeding that does not stop after 15-20 minutes of constant, firm pressure.   Do not use any nonsteroidal anti-inflammatories (NSAIDs) like ibuprofen, Motrin, naproxen, Aleve, etc. which are all available over-the-counter.  Please just use Tylenol at a dose of 500mg-650mg once every 6 hours as needed for your aches, pains, fevers.  

## 2020-07-01 DIAGNOSIS — M79676 Pain in unspecified toe(s): Secondary | ICD-10-CM

## 2020-07-03 ENCOUNTER — Ambulatory Visit (INDEPENDENT_AMBULATORY_CARE_PROVIDER_SITE_OTHER): Payer: BC Managed Care – PPO

## 2020-07-03 ENCOUNTER — Ambulatory Visit (HOSPITAL_COMMUNITY)
Admission: EM | Admit: 2020-07-03 | Discharge: 2020-07-03 | Disposition: A | Discharge status: Home / Self Care | Payer: BC Managed Care – PPO | Attending: Urgent Care | Admitting: Urgent Care

## 2020-07-03 ENCOUNTER — Other Ambulatory Visit: Payer: Self-pay

## 2020-07-03 ENCOUNTER — Encounter (HOSPITAL_COMMUNITY): Payer: Self-pay | Admitting: *Deleted

## 2020-07-03 DIAGNOSIS — S91011A Laceration without foreign body, right ankle, initial encounter: Secondary | ICD-10-CM | POA: Diagnosis not present

## 2020-07-03 DIAGNOSIS — M25571 Pain in right ankle and joints of right foot: Secondary | ICD-10-CM

## 2020-07-03 DIAGNOSIS — S9001XA Contusion of right ankle, initial encounter: Secondary | ICD-10-CM

## 2020-07-03 MED ORDER — KETOROLAC TROMETHAMINE 60 MG/2ML IM SOLN
INTRAMUSCULAR | Status: AC
  Filled 2020-07-03: qty 2

## 2020-07-03 MED ORDER — KETOROLAC TROMETHAMINE 60 MG/2ML IM SOLN
60.0000 mg | Freq: Once | INTRAMUSCULAR | Status: AC
  Administered 2020-07-03: 60 mg via INTRAMUSCULAR

## 2020-07-03 MED ORDER — LIDOCAINE-EPINEPHRINE 1 %-1:100000 IJ SOLN
INTRAMUSCULAR | Status: AC
  Filled 2020-07-03: qty 1

## 2020-07-03 NOTE — ED Provider Notes (Addendum)
[facility]   MRN: [id number] DOB: 1981/05/27  Subjective:   Morgan Donaldson is a 39 y.o. female presenting for suffering a left ankle injury today.  Patient states that she thinks her dog went under her and subsequently the kitchen island rolled into her left ankle causing a laceration and significant pain, decreased range of motion.  Patient cannot recall if she sprained her ankle, rolled it or twisted it.  She has inability to bear weight on it right now though.  Has been using crutches to move around.  Has not taken anything for pain.  She did clean her wound prior to coming in.  No current facility-administered medications for this encounter.  Current Outpatient Medications:  .  buPROPion (WELLBUTRIN XL) 150 MG 24 hr tablet, Take 1 tablet (150 mg total) by mouth daily., Disp: 30 tablet, Rfl: 1 .  FLUoxetine (PROZAC) 10 MG capsule, Take 1 capsule (10 mg total) by mouth daily for 7 days, THEN 2 capsules (20 mg total) daily., Disp: 67 capsule, Rfl: 0 .  Multiple Vitamin (MULTIVITAMIN WITH MINERALS) TABS tablet, Take 1 tablet by mouth daily., Disp: , Rfl:  .  traMADol (ULTRAM) 50 MG tablet, Take 1 tablet (50 mg total) by mouth every 12 (twelve) hours as needed., Disp: 50 tablet, Rfl: 2 .  traZODone (DESYREL) 50 MG tablet, Take 1 tablet (50 mg total) by mouth at bedtime and may repeat dose one time if needed., Disp: 60 tablet, Rfl: 1   No Known Allergies  Past Medical History:  Diagnosis Date  . Bronchitis   . Hydrosalpinx    bilateral  . Palpitations   . Physiological ovarian cysts      Past Surgical History:  Procedure Laterality Date  . ORIF PELVIC FRACTURE      Family History  Problem Relation Age of Onset  . Lung cancer Mother        died at age 53  . CAD Mother        MI at age 49  . Hypertension Mother   . Schizophrenia Father   . Hypertension Sister   . Hyperlipidemia Sister   . COPD Sister   . Healthy Brother   . Healthy Sister      Social History   Tobacco Use  . Smoking status: Current Every Day Smoker    Packs/day: 0.50    Types: Cigarettes  . Smokeless tobacco: Never Used  Substance Use Topics  . Alcohol use: No  . Drug use: Yes    Types: Marijuana    ROS   Objective:   Vitals: BP (!) 123/39 (BP Location: Right Arm)   Pulse 62   Temp 98.3 F (36.8 C) (Oral)   Resp 18   Ht 5\' 7"  (1.702 m)   Wt 165 lb 14.3 oz (75.3 kg)   LMP 06/18/2020 (Exact Date)   SpO2 98%   BMI 25.98 kg/m   Physical Exam Constitutional:      General: She is not in acute distress.    Appearance: Normal appearance. She is well-developed. She is not ill-appearing, toxic-appearing or diaphoretic.  HENT:     Head: Normocephalic and atraumatic.     Nose: Nose normal.     Mouth/Throat:     Mouth: Mucous membranes are moist.     Pharynx: Oropharynx is clear.  Eyes:     General: No scleral icterus.       Right eye: No discharge.  Left eye: No discharge.     Extraocular Movements: Extraocular movements intact.     Conjunctiva/sclera: Conjunctivae normal.     Pupils: Pupils are equal, round, and reactive to light.  Cardiovascular:     Rate and Rhythm: Normal rate.  Pulmonary:     Effort: Pulmonary effort is normal.  Musculoskeletal:     Left ankle: Laceration (~2cm medially as outlined) present. No swelling, deformity or ecchymosis. Tenderness present over the medial malleolus. No lateral malleolus, ATF ligament, AITF ligament, CF ligament, posterior TF ligament, base of 5th metatarsal or proximal fibula tenderness. Decreased range of motion.       Legs:  Skin:    General: Skin is warm and dry.  Neurological:     General: No focal deficit present.     Mental Status: She is alert and oriented to person, place, and time.     Motor: No weakness.     Coordination: Coordination normal.     Gait: Gait normal.     Deep Tendon Reflexes: Reflexes normal.  Psychiatric:        Mood and Affect: Mood normal.         Behavior: Behavior normal.        Thought Content: Thought content normal.        Judgment: Judgment normal.     DG Ankle Complete Right  Result Date: 07/03/2020 CLINICAL DATA:  Right ankle pain, laceration. Metal island cart fell on ankle today. EXAM: RIGHT ANKLE - COMPLETE 3+ VIEW COMPARISON:  None. FINDINGS: There is no evidence of fracture, dislocation, or joint effusion. There is no evidence of arthropathy or other focal bone abnormality. A dressing overlies the medial ankle with small soft tissue defect consistent with laceration. No radiopaque foreign body. IMPRESSION: Medial soft tissue laceration. No radiopaque foreign body or osseous abnormality. Electronically Signed   By: Narda Rutherford M.D.   On: 07/03/2020 17:49   PROCEDURE NOTE: laceration repair Verbal consent obtained from patient.  Local anesthesia with 4cc Lidocaine 1% with epinephrine.  Wound explored for tendon, ligament damage. Wound scrubbed with soap and water and rinsed. Wound closed with #3 4-0 Prolene (2 horizontal mattress, 1 simple interrupted) sutures.  Wound cleansed and dressed.   Assessment and Plan :   PDMP not reviewed this encounter.  1. Acute right ankle pain   2. Contusion of right ankle, initial encounter     Ankle x-ray negative.  Laceration repaired successfully. Wound care reviewed. Return-to-clinic precautions discussed, patient verbalized understanding. Otherwise, follow up in 7 to 10 days for suture removal.      Wallis Bamberg, PA-C 07/03/20 1803

## 2020-07-03 NOTE — ED Triage Notes (Signed)
Pt reports she hit the medial aspect of RT ankle on a metal [employer] . Pt has a lac to medial side of ankle . Bleeding controlled on arrival to [facility]. Pt placed a dry dsy with butterfly closures to skin. Pt does not need a tetanus update .

## 2020-07-03 NOTE — Discharge Instructions (Signed)

## 2020-07-12 MED ORDER — OXYCODONE-ACETAMINOPHEN 10-325 MG PO TABS: 1.0000 | ORAL_TABLET | ORAL | 0 refills | Status: DC | PRN

## 2020-07-12 MED ORDER — ONDANSETRON HCL 4 MG PO TABS: 4.0000 mg | ORAL_TABLET | Freq: Three times a day (TID) | ORAL | 0 refills | Status: DC | PRN

## 2020-07-12 NOTE — Addendum Note (Signed)
Addended by: Lenn Sink on: 07/12/2020 12:32 PM   Modules accepted: Orders

## 2020-07-13 ENCOUNTER — Encounter: Payer: Self-pay | Admitting: Podiatry

## 2020-07-13 DIAGNOSIS — M2012 Hallux valgus (acquired), left foot: Secondary | ICD-10-CM | POA: Diagnosis not present

## 2020-07-19 ENCOUNTER — Ambulatory Visit (INDEPENDENT_AMBULATORY_CARE_PROVIDER_SITE_OTHER): Payer: BC Managed Care – PPO

## 2020-07-19 ENCOUNTER — Encounter: Payer: Self-pay | Admitting: Podiatry

## 2020-07-19 ENCOUNTER — Other Ambulatory Visit: Payer: Self-pay

## 2020-07-19 ENCOUNTER — Ambulatory Visit (INDEPENDENT_AMBULATORY_CARE_PROVIDER_SITE_OTHER): Payer: BC Managed Care – PPO | Admitting: Podiatry

## 2020-07-19 DIAGNOSIS — M21622 Bunionette of left foot: Secondary | ICD-10-CM | POA: Diagnosis not present

## 2020-07-19 MED ORDER — HYDROCODONE-ACETAMINOPHEN 10-325 MG PO TABS: 1.0000 | ORAL_TABLET | ORAL | 0 refills | Status: AC | PRN

## 2020-07-19 MED ORDER — DICLOFENAC SODIUM 75 MG PO TBEC
75.0000 mg | DELAYED_RELEASE_TABLET | Freq: Two times a day (BID) | ORAL | 2 refills | Status: DC
Start: 2020-07-19 — End: 2020-09-22

## 2020-07-20 NOTE — Progress Notes (Signed)
Subjective:   Patient ID: Morgan Donaldson, female   DOB: 39 y.o.   MRN: [id number]   HPI Patient presents stating she is doing well but she does have quite a bit of pain but it seems to be easing at this point   ROS      Objective:  Physical Exam  Neurovascular status intact negative Denna Haggard' sign noted wound edges are well coapted there is moderate edema in the foot localized to the forefoot with no drainage or other pathology.  Good alignment of bone good correction of deformity      Assessment:  Patient may have been moderately too active on the foot it appears to have moderate swelling but overall is healing well     Plan:  H&P x-ray reviewed discussed the importance of elevation compression immobilization and continue boot usage with range of motion exercises.  Reappoint 2 weeks or earlier if any issues were to occur  X-rays indicate the osteotomies are healing well fixation is in place no indication of bone movement

## 2020-07-21 ENCOUNTER — Telehealth (INDEPENDENT_AMBULATORY_CARE_PROVIDER_SITE_OTHER): Payer: BC Managed Care – PPO | Admitting: Psychiatry

## 2020-07-21 ENCOUNTER — Other Ambulatory Visit: Payer: Self-pay

## 2020-07-21 DIAGNOSIS — F902 Attention-deficit hyperactivity disorder, combined type: Secondary | ICD-10-CM | POA: Diagnosis not present

## 2020-07-21 DIAGNOSIS — F411 Generalized anxiety disorder: Secondary | ICD-10-CM | POA: Diagnosis not present

## 2020-07-21 MED ORDER — AMPHETAMINE-DEXTROAMPHETAMINE 10 MG PO TABS: 10.0000 mg | ORAL_TABLET | Freq: Two times a day (BID) | ORAL | 0 refills | Status: DC

## 2020-07-21 MED ORDER — TRAZODONE HCL 50 MG PO TABS: 50.0000 mg | ORAL_TABLET | Freq: Every evening | ORAL | 2 refills | Status: DC | PRN

## 2020-07-21 MED ORDER — FLUOXETINE HCL 40 MG PO CAPS: 40.0000 mg | ORAL_CAPSULE | Freq: Every day | ORAL | 2 refills | Status: DC
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
## 2020-07-26 ENCOUNTER — Other Ambulatory Visit (HOSPITAL_COMMUNITY): Payer: Self-pay | Admitting: Psychiatry

## 2020-07-26 ENCOUNTER — Telehealth (HOSPITAL_COMMUNITY): Payer: Self-pay | Admitting: *Deleted

## 2020-07-26 MED ORDER — AMPHETAMINE-DEXTROAMPHETAMINE 10 MG PO TABS: 10.0000 mg | ORAL_TABLET | Freq: Two times a day (BID) | ORAL | 0 refills | Status: DC

## 2020-07-26 NOTE — Telephone Encounter (Signed)
I added another 10 day Rx starting Dec 14.

## 2020-07-26 NOTE — Telephone Encounter (Signed)
Pt called questioning why she only received #40 of the Adderall instead of #60. Pt pharmacy, [facility], verifies this as they don't have the supply on hand. So pt will need another script for the remaining #20 when she is due.

## 2020-08-09 ENCOUNTER — Encounter: Payer: Self-pay | Admitting: Podiatry

## 2020-08-09 ENCOUNTER — Ambulatory Visit (INDEPENDENT_AMBULATORY_CARE_PROVIDER_SITE_OTHER): Payer: BC Managed Care – PPO

## 2020-08-09 ENCOUNTER — Other Ambulatory Visit: Payer: Self-pay

## 2020-08-09 ENCOUNTER — Ambulatory Visit (INDEPENDENT_AMBULATORY_CARE_PROVIDER_SITE_OTHER): Payer: BC Managed Care – PPO | Admitting: Podiatry

## 2020-08-09 DIAGNOSIS — M21619 Bunion of unspecified foot: Secondary | ICD-10-CM

## 2020-08-09 DIAGNOSIS — M21622 Bunionette of left foot: Secondary | ICD-10-CM

## 2020-08-11 ENCOUNTER — Ambulatory Visit: Payer: BC Managed Care – PPO | Admitting: Obstetrics & Gynecology

## 2020-08-11 NOTE — Progress Notes (Signed)
Subjective:   Patient ID: Morgan Donaldson, female   DOB: 39 y.o.   MRN: [id number]   HPI Patient states she is doing very well and so far is pleased with how her surgery correction is going   ROS      Objective:  Physical Exam  Neurovascular status intact negative Denna Haggard' sign noted wound edges well coapted first metatarsal fifth metatarsal good alignment good range of motion first MPJ     Assessment:  Doing well post surgery of the left foot     Plan:  Advised on the continuation of range of motion gradual return to normal shoe gear and no jumping on her foot but may start to do slow exercise.  Patient will be seen back 6 weeks or earlier if needed  X-ray indicates there is some slight secondary healing around the fifth metatarsal but this should be uneventful and final healing and good alignment noted joint congruence with fixation in place

## 2020-08-24 ENCOUNTER — Other Ambulatory Visit: Payer: Self-pay

## 2020-08-24 ENCOUNTER — Telehealth (INDEPENDENT_AMBULATORY_CARE_PROVIDER_SITE_OTHER): Payer: BC Managed Care – PPO | Admitting: Psychiatry

## 2020-08-24 DIAGNOSIS — F902 Attention-deficit hyperactivity disorder, combined type: Secondary | ICD-10-CM

## 2020-08-24 DIAGNOSIS — F411 Generalized anxiety disorder: Secondary | ICD-10-CM

## 2020-08-24 MED ORDER — FLUOXETINE HCL 40 MG PO CAPS: 40.0000 mg | ORAL_CAPSULE | Freq: Every day | ORAL | 2 refills | Status: DC

## 2020-08-24 MED ORDER — TRAZODONE HCL 50 MG PO TABS: 50.0000 mg | ORAL_TABLET | Freq: Every evening | ORAL | 2 refills | Status: DC | PRN

## 2020-08-24 MED ORDER — AMPHETAMINE-DEXTROAMPHETAMINE 10 MG PO TABS: 10.0000 mg | ORAL_TABLET | Freq: Two times a day (BID) | ORAL | 0 refills | Status: DC
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
## 2020-09-06 ENCOUNTER — Ambulatory Visit: Payer: BC Managed Care – PPO

## 2020-09-06 ENCOUNTER — Ambulatory Visit (INDEPENDENT_AMBULATORY_CARE_PROVIDER_SITE_OTHER): Payer: BC Managed Care – PPO

## 2020-09-06 ENCOUNTER — Other Ambulatory Visit: Payer: Self-pay

## 2020-09-06 ENCOUNTER — Ambulatory Visit (INDEPENDENT_AMBULATORY_CARE_PROVIDER_SITE_OTHER): Payer: BC Managed Care – PPO | Admitting: Podiatry

## 2020-09-06 ENCOUNTER — Encounter: Payer: Self-pay | Admitting: Podiatry

## 2020-09-06 DIAGNOSIS — M21622 Bunionette of left foot: Secondary | ICD-10-CM | POA: Diagnosis not present

## 2020-09-07 NOTE — Progress Notes (Signed)
Subjective:   Patient ID: Morgan Donaldson, female   DOB: 40 y.o.   MRN: [id number]   HPI Patient states doing well with surgery and pleased with results at this time   ROS      Objective:  Physical Exam  Neurovascular status intact negative Denna Haggard' sign noted wound edges well coapted alignment good range of motion adequate currently     Assessment:  Doing well post surgical intervention     Plan:  H&P x-rays reviewed and advised on gradual increase in activity levels and gradual increase in range of motion exercises. Patient will be seen back encouraged to call with questions  X-rays indicate osteotomies healing well no indications of pathology occurring good alignment noted

## 2020-09-22 ENCOUNTER — Ambulatory Visit (INDEPENDENT_AMBULATORY_CARE_PROVIDER_SITE_OTHER): Payer: BC Managed Care – PPO | Admitting: Obstetrics and Gynecology

## 2020-09-22 ENCOUNTER — Other Ambulatory Visit (HOSPITAL_COMMUNITY)
Admission: RE | Admit: 2020-09-22 | Discharge: 2020-09-22 | Disposition: A | Discharge status: Home / Self Care | Payer: BC Managed Care – PPO | Source: Ambulatory Visit | Attending: Obstetrics & Gynecology | Admitting: Obstetrics & Gynecology

## 2020-09-22 ENCOUNTER — Encounter: Payer: Self-pay | Admitting: Obstetrics and Gynecology

## 2020-09-22 ENCOUNTER — Other Ambulatory Visit: Payer: Self-pay

## 2020-09-22 VITALS — BP 124/72 | HR 71 | Wt 154.7 lb

## 2020-09-22 DIAGNOSIS — R87610 Atypical squamous cells of undetermined significance on cytologic smear of cervix (ASC-US): Secondary | ICD-10-CM | POA: Diagnosis not present

## 2020-09-22 DIAGNOSIS — Z113 Encounter for screening for infections with a predominantly sexual mode of transmission: Secondary | ICD-10-CM | POA: Diagnosis not present

## 2020-09-22 DIAGNOSIS — Z3042 Encounter for surveillance of injectable contraceptive: Secondary | ICD-10-CM

## 2020-09-22 DIAGNOSIS — Z1151 Encounter for screening for human papillomavirus (HPV): Secondary | ICD-10-CM | POA: Insufficient documentation

## 2020-09-22 DIAGNOSIS — Z01419 Encounter for gynecological examination (general) (routine) without abnormal findings: Secondary | ICD-10-CM

## 2020-09-22 DIAGNOSIS — R87810 Cervical high risk human papillomavirus (HPV) DNA test positive: Secondary | ICD-10-CM | POA: Insufficient documentation

## 2020-09-22 DIAGNOSIS — Z3202 Encounter for pregnancy test, result negative: Secondary | ICD-10-CM | POA: Diagnosis not present

## 2020-09-22 LAB — POCT PREGNANCY, URINE: Preg Test, Ur: NEGATIVE

## 2020-09-22 MED ORDER — MEDROXYPROGESTERONE ACETATE 150 MG/ML IM SUSP
150.0000 mg | Freq: Once | INTRAMUSCULAR | Status: AC
  Administered 2020-09-22: 150 mg via INTRAMUSCULAR

## 2020-09-23 LAB — LIPID PANEL
Chol/HDL Ratio: 2.9 ratio (ref 0.0–4.4)
Cholesterol, Total: 150 mg/dL (ref 100–199)
HDL: 51 mg/dL (ref >39)
LDL Chol Calc (NIH): 73 mg/dL (ref 0–99)
Triglycerides: 154 mg/dL — ABNORMAL HIGH (ref 0–149)
VLDL Cholesterol Cal: 26 mg/dL (ref 5–40)

## 2020-09-23 LAB — CBC
Hematocrit: 37 % (ref 34.0–46.6)
Hemoglobin: 12.9 g/dL (ref 11.1–15.9)
MCH: 30.8 pg (ref 26.6–33.0)
MCHC: 34.9 g/dL (ref 31.5–35.7)
MCV: 88 fL (ref 79–97)
Platelets: 241 10*3/uL (ref 150–450)
RBC: 4.19 x10E6/uL (ref 3.77–5.28)
RDW: 13 % (ref 11.7–15.4)
WBC: 6.4 10*3/uL (ref 3.4–10.8)

## 2020-09-23 LAB — COMPREHENSIVE METABOLIC PANEL
ALT: 11 IU/L (ref 0–32)
AST: 18 IU/L (ref 0–40)
Albumin/Globulin Ratio: 1.8 (ref 1.2–2.2)
Albumin: 4.8 g/dL (ref 3.8–4.8)
Alkaline Phosphatase: 51 IU/L (ref 44–121)
BUN/Creatinine Ratio: 8 — ABNORMAL LOW (ref 9–23)
BUN: 6 mg/dL (ref 6–20)
Bilirubin Total: 0.3 mg/dL (ref 0.0–1.2)
CO2: 21 mmol/L (ref 20–29)
Calcium: 9.4 mg/dL (ref 8.7–10.2)
Chloride: 102 mmol/L (ref 96–106)
Creatinine, Ser: 0.77 mg/dL (ref 0.57–1.00)
GFR calc Af Amer: 113 mL/min/{1.73_m2} (ref >59)
GFR calc non Af Amer: 98 mL/min/{1.73_m2} (ref >59)
Globulin, Total: 2.6 g/dL (ref 1.5–4.5)
Glucose: 77 mg/dL (ref 65–99)
Potassium: 4.1 mmol/L (ref 3.5–5.2)
Sodium: 138 mmol/L (ref 134–144)
Total Protein: 7.4 g/dL (ref 6.0–8.5)

## 2020-09-23 LAB — RPR: RPR Ser Ql: NONREACTIVE

## 2020-09-23 LAB — HIV ANTIBODY (ROUTINE TESTING W REFLEX): HIV Screen 4th Generation wRfx: NONREACTIVE

## 2020-09-23 LAB — HEMOGLOBIN A1C
Est. average glucose Bld gHb Est-mCnc: 105 mg/dL
Hgb A1c MFr Bld: 5.3 % (ref 4.8–5.6)

## 2020-09-23 LAB — TSH: TSH: 0.505 u[IU]/mL (ref 0.450–4.500)

## 2020-09-23 LAB — HEPATITIS B SURFACE ANTIGEN: Hepatitis B Surface Ag: NEGATIVE

## 2020-09-23 LAB — HEPATITIS C ANTIBODY: Hep C Virus Ab: <0.1 s/co ratio (ref 0.0–0.9)

## 2020-09-23 NOTE — Progress Notes (Addendum)
Obstetrics and Gynecology Annual Patient Evaluation  Appointment Date: 09/23/2020  OBGYN Clinic: [facility]  Primary Care Provider: None  Chief Complaint:  Chief Complaint  Patient presents with  . Gynecologic Exam    History of Present Illness: Morgan Donaldson is a 40 y.o. Caucasian G0P0000 (No LMP recorded. Patient has had an injection.), seen for the above chief complaint. Her past medical history is significant for h/o ovarian cysts   No problems or complaints for today. She likes the depo provera   Review of Systems: Pertinent items noted in HPI and remainder of comprehensive ROS otherwise negative.    Patient Active Problem List   Diagnosis Date Noted  . GAD (generalized anxiety disorder) 06/21/2020  . Attention deficit hyperactivity disorder (ADHD), combined type 06/21/2020  . Breakthrough bleeding on depo provera 05/12/2020  . Pap smear abnormality of cervix/human papillomavirus (HPV) positive 08/17/2019  . Hemorrhagic cyst of left ovary 08/11/2019  . Tobacco abuse 05/19/2019  . History of drug abuse in remission (HCC) 05/19/2019   Past Medical History:  Past Medical History:  Diagnosis Date  . Bronchitis   . Hydrosalpinx    bilateral  . Palpitations   . Physiological ovarian cysts     Past Surgical History:  Past Surgical History:  Procedure Laterality Date  . ORIF PELVIC FRACTURE      Past Obstetrical History:  OB History  Gravida Para Term Preterm AB Living  0 0 0 0 0 0  SAB IAB Ectopic Multiple Live Births  0 0 0 0 0    Past Gynecological History: As per HPI. Periods: none History of Pap Smear(s): Yes.   Last pap 07/2019, which was neg cyto but hpv pos but neg 16/18/45  Social History:  Social History   Socioeconomic History  . Marital status: Significant Other    Spouse name: Not on file  . Number of children: 0  . Years of education: Not on file  . Highest education level: Not on file  Occupational History  . Not  on file  Tobacco Use  . Smoking status: Former Smoker    Packs/day: 0.50    Types: Cigarettes    Quit date: 10/27/2019    Years since quitting: 0.9  . Smokeless tobacco: Never Used  Substance and Sexual Activity  . Alcohol use: No  . Drug use: Yes    Types: Marijuana  . Sexual activity: Not on file  Other Topics Concern  . Not on file  Social History Narrative  . Not on file   Social Determinants of Health   Financial Resource Strain: Not on file  Food Insecurity: Food Insecurity Present  . Worried About Programme researcher, broadcasting/film/video in the Last Year: Sometimes true  . Ran Out of Food in the Last Year: Sometimes true  Transportation Needs: Unmet Transportation Needs  . Lack of Transportation (Medical): Yes  . Lack of Transportation (Non-Medical): No  Physical Activity: Not on file  Stress: Not on file  Social Connections: Not on file  Intimate Partner Violence: Not on file    Family History:  Family History  Problem Relation Age of Onset  . Lung cancer Mother        died at age 32  . CAD Mother        MI at age 41  . Hypertension Mother   . Schizophrenia Father   . Hypertension Sister   . Hyperlipidemia Sister   . COPD Sister   . Healthy Brother   .  Healthy Sister     Medications We administered medroxyPROGESTERone. Current Outpatient Medications  Medication Sig Dispense Refill  . amphetamine-dextroamphetamine (ADDERALL) 10 MG tablet Take 1 tablet (10 mg total) by mouth 2 (two) times daily with a meal. 60 tablet 0  . [START ON 10/18/2020] FLUoxetine (PROZAC) 40 MG capsule Take 1 capsule (40 mg total) by mouth daily. 30 capsule 2  . traMADol (ULTRAM) 50 MG tablet Take 1 tablet (50 mg total) by mouth every 12 (twelve) hours as needed. 50 tablet 2  . [START ON 10/18/2020] traZODone (DESYREL) 50 MG tablet Take 1 tablet (50 mg total) by mouth at bedtime as needed for sleep. 30 tablet 2  . amphetamine-dextroamphetamine (ADDERALL) 10 MG tablet Take 1 tablet (10 mg total) by mouth  2 (two) times daily with a meal. (Patient not taking: Reported on 09/22/2020) 60 tablet 0  . [START ON 10/23/2020] amphetamine-dextroamphetamine (ADDERALL) 10 MG tablet Take 1 tablet (10 mg total) by mouth 2 (two) times daily with a meal. (Patient not taking: Reported on 09/22/2020) 60 tablet 0   No current facility-administered medications for this visit.    Allergies Patient has no known allergies.   Physical Exam:  BP 124/72   Pulse 71   Wt 154 lb 11.2 oz (70.2 kg)   BMI 24.23 kg/m  Body mass index is 24.23 kg/m. General appearance: Well nourished, well developed female in no acute distress.  Neck:  Supple, normal appearance, and no thyromegaly  Cardiovascular: normal s1 and s2.  No murmurs, rubs or gallops. Respiratory:  Clear to auscultation bilateral. Normal respiratory effort Abdomen: positive bowel sounds and no masses, hernias; diffusely non tender to palpation, non distended Breasts: patient denies any s/s. Neuro/Psych:  Normal mood and affect.  Skin:  Warm and dry.  Lymphatic:  No inguinal lymphadenopathy.   Pelvic exam: is not limited by body habitus EGBUS: within normal limits Vagina: within normal limits and with no blood or discharge in the vault Cervix: normal appearing cervix without tenderness, discharge or lesions. Uterus:  nonenlarged and non tender Adnexa:  normal adnexa and no mass, fullness, tenderness Rectovaginal: deferred  Laboratory: none  Radiology: none  Assessment: pt doing well  Plan:  1. Screening examination for STD (sexually transmitted disease) Pt desires - HIV Antibody (routine testing w rflx) - RPR - Hepatitis C Antibody - Hepatitis B Surface AntiGEN  2. Well woman exam with routine gynecological exam Routine care - Cytology - PAP( [facility]) - Lipid panel - TSH - Comprehensive metabolic panel - CBC - Hemoglobin A1c  3. Encounter for surveillance of injectable contraceptive Depo today - medroxyPROGESTERone (DEPO-PROVERA)  injection 150 mg  Orders Placed This Encounter  Procedures  . HIV Antibody (routine testing w rflx)  . RPR  . Hepatitis C Antibody  . Hepatitis B Surface AntiGEN  . Lipid panel  . TSH  . Comprehensive metabolic panel  . CBC  . Hemoglobin A1c  . Pregnancy, urine POC    RTC 67m for depo  Cornelia Copa MD Attending [facility])

## 2020-09-28 ENCOUNTER — Telehealth (HOSPITAL_COMMUNITY): Payer: Self-pay

## 2020-09-28 NOTE — Telephone Encounter (Signed)
Patient called requesting a letter for an emotional support animal to be faxed to her landlord. Can we provide that? Please advise. Thank you

## 2020-09-28 NOTE — Telephone Encounter (Signed)
Absolutely yes

## 2020-09-29 ENCOUNTER — Encounter (HOSPITAL_COMMUNITY): Payer: Self-pay

## 2020-09-29 NOTE — Telephone Encounter (Signed)
Letter completed and in folder for dr to sign

## 2020-10-01 LAB — CYTOLOGY - PAP
Chlamydia: NEGATIVE
Comment: NEGATIVE
Comment: NEGATIVE
Comment: NEGATIVE
Comment: NEGATIVE
Comment: NORMAL
HPV 16: NEGATIVE
HPV 18 / 45: NEGATIVE
High risk HPV: POSITIVE — AB
Neisseria Gonorrhea: NEGATIVE
Trichomonas: NEGATIVE

## 2020-10-13 ENCOUNTER — Other Ambulatory Visit (HOSPITAL_COMMUNITY): Payer: Self-pay | Admitting: Psychiatry

## 2020-11-01 ENCOUNTER — Ambulatory Visit (INDEPENDENT_AMBULATORY_CARE_PROVIDER_SITE_OTHER): Payer: BC Managed Care – PPO | Admitting: Obstetrics and Gynecology

## 2020-11-01 ENCOUNTER — Other Ambulatory Visit: Payer: Self-pay

## 2020-11-01 ENCOUNTER — Encounter: Payer: Self-pay | Admitting: Obstetrics and Gynecology

## 2020-11-01 ENCOUNTER — Other Ambulatory Visit (HOSPITAL_COMMUNITY)
Admission: RE | Admit: 2020-11-01 | Discharge: 2020-11-01 | Disposition: A | Discharge status: Home / Self Care | Payer: BC Managed Care – PPO | Source: Ambulatory Visit | Attending: Obstetrics and Gynecology | Admitting: Obstetrics and Gynecology

## 2020-11-01 VITALS — BP 136/75 | HR 61 | Wt 153.3 lb

## 2020-11-01 DIAGNOSIS — R87619 Unspecified abnormal cytological findings in specimens from cervix uteri: Secondary | ICD-10-CM

## 2020-11-01 DIAGNOSIS — Z3202 Encounter for pregnancy test, result negative: Secondary | ICD-10-CM

## 2020-11-01 LAB — POCT PREGNANCY, URINE: Preg Test, Ur: NEGATIVE

## 2020-11-01 NOTE — Progress Notes (Signed)
Patient with AGUS + HPV pap smear 09/22/20 here for colposcopy and endometrial biopsy  COLPOSCOPY Patient given informed consent, signed copy in the chart, time out was performed.  Placed in lithotomy position. Cervix viewed with speculum and colposcope after application of acetic acid.   Colposcopy adequate?  yes Acetowhite lesions? 1 o'clock Punctation? no Mosaicism?  no Abnormal vasculature?  no Biopsies? 1 o'clock ECC? yes  ENDOMETRIAL BIOPSY     The indications for endometrial biopsy were reviewed.   Risks of the biopsy including cramping, bleeding, infection, uterine perforation, inadequate specimen and need for additional procedures  were discussed. The patient states she understands and agrees to undergo procedure today. Consent was signed. Time out was performed. Urine HCG was negative. A sterile speculum was placed in the patient's vagina and the cervix was prepped with Betadine. A single-toothed tenaculum was placed on the anterior lip of the cervix to stabilize it. The uterine cavity was sounded to a depth of 7 cm using the uterine sound. The 3 mm pipelle was introduced into the endometrial cavity without difficulty, 2passes were made.  A  moderate amount of tissue was  sent to pathology. The instruments were removed from the patient's vagina. Minimal bleeding from the cervix was noted. The patient tolerated the procedure well.     Patient was given post procedure instructions.  She will return in 2 weeks for results and for further management as indicated.  Catalina Antigua, MD

## 2020-11-03 LAB — SURGICAL PATHOLOGY

## 2020-11-04 ENCOUNTER — Telehealth: Payer: Self-pay

## 2020-11-04 NOTE — Telephone Encounter (Addendum)
-----   Message from Catalina Antigua, MD sent at 11/04/2020  8:55 AM EST ----- Please inform patient of normal endometrial biopsy and cervical biopsy consistent with pap smear. Plan is to repeat the pap smear in 1 year  Thanks  Called pt and informed pt results and f/u.  Pt verbalized understanding with no further questions.   Morgan Donaldson  11/04/20

## 2020-11-14 ENCOUNTER — Other Ambulatory Visit (HOSPITAL_COMMUNITY): Payer: Self-pay | Admitting: Psychiatry

## 2020-11-18 ENCOUNTER — Other Ambulatory Visit: Payer: Self-pay

## 2020-11-18 ENCOUNTER — Telehealth (HOSPITAL_COMMUNITY): Payer: Self-pay | Admitting: *Deleted

## 2020-11-18 ENCOUNTER — Telehealth (INDEPENDENT_AMBULATORY_CARE_PROVIDER_SITE_OTHER): Payer: BC Managed Care – PPO | Admitting: Psychiatry

## 2020-11-18 DIAGNOSIS — F902 Attention-deficit hyperactivity disorder, combined type: Secondary | ICD-10-CM | POA: Diagnosis not present

## 2020-11-18 DIAGNOSIS — F411 Generalized anxiety disorder: Secondary | ICD-10-CM

## 2020-11-18 MED ORDER — DULOXETINE HCL 30 MG PO CPEP: ORAL_CAPSULE | ORAL | 0 refills | Status: DC

## 2020-11-18 MED ORDER — AMPHETAMINE-DEXTROAMPHETAMINE 10 MG PO TABS: 10.0000 mg | ORAL_TABLET | Freq: Two times a day (BID) | ORAL | 0 refills | Status: DC

## 2020-11-18 NOTE — Telephone Encounter (Signed)
Prior approval obtained through Cover my meds.  PA number: [id number].  Cymbalta approved from 11/18/20 to 11/18/21.  FYI
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
## 2020-12-21 ENCOUNTER — Other Ambulatory Visit: Payer: Self-pay

## 2020-12-21 ENCOUNTER — Ambulatory Visit (INDEPENDENT_AMBULATORY_CARE_PROVIDER_SITE_OTHER): Payer: BC Managed Care – PPO

## 2020-12-21 VITALS — BP 126/85 | HR 89 | Wt 149.0 lb

## 2020-12-21 DIAGNOSIS — Z3042 Encounter for surveillance of injectable contraceptive: Secondary | ICD-10-CM | POA: Diagnosis not present

## 2020-12-21 MED ORDER — MEDROXYPROGESTERONE ACETATE 150 MG/ML IM SUSP
150.0000 mg | Freq: Once | INTRAMUSCULAR | Status: AC
  Administered 2020-12-21: 150 mg via INTRAMUSCULAR

## 2020-12-21 NOTE — Telephone Encounter (Signed)
Opened in error

## 2020-12-21 NOTE — Progress Notes (Signed)
ATTESTATION OF SUPERVISION OF RN: Evaluation and management procedures were performed by the RN under my supervision and collaboration. I have reviewed the nursing note and chart and agree with the management and plan for this patient.  Trey Bebee, CNM  

## 2020-12-21 NOTE — Progress Notes (Signed)
[name] here for Depo-Provera Injection. Injection administered without complication. Patient will return in 3 months for next injection between July 12 and July 26. Next annual visit due 08/2021.   Ralene Bathe, RN 12/21/2020  3:22 PM

## 2021-01-06 ENCOUNTER — Telehealth (HOSPITAL_COMMUNITY): Payer: Self-pay

## 2021-01-06 NOTE — Telephone Encounter (Signed)
Received a fax from [facility] requesting a PA on patient's Amphetamine-dextroamphetamine 10mg . I notified the pharmacy that her previous provider, Dr. . Has retired & left the practice. She has a followup appointment scheduled with her new provider, Dr. Demetrius Charity, on 5/17. I notified both the pharmacy & the patient (LVM) that I would do the PA on 5/17 after she's been seen by her new provider

## 2021-01-10 ENCOUNTER — Telehealth (HOSPITAL_COMMUNITY): Payer: BC Managed Care – PPO | Admitting: Psychiatry

## 2021-01-10 ENCOUNTER — Telehealth (HOSPITAL_COMMUNITY): Payer: Self-pay

## 2021-01-10 ENCOUNTER — Other Ambulatory Visit: Payer: Self-pay | Admitting: Psychiatry

## 2021-01-10 MED ORDER — DULOXETINE HCL 60 MG PO CPEP: 60.0000 mg | ORAL_CAPSULE | Freq: Every day | ORAL | 0 refills | Status: DC

## 2021-01-10 NOTE — Telephone Encounter (Signed)
Ordered duloxetine 60 mg daily.

## 2021-01-10 NOTE — Telephone Encounter (Signed)
Medication refill - Telephone call with pt to follow up on received refill request for pt's Duloxetine 30 mg, 2 a day. Reminded pt of first appt with Dr. Daleen Bo 01/11/21. Pt. stated out for about a week.  Agreed to see if could be filled by a covering MD but if not to review with new provider Dr. Daleen Bo at appt 01/11/21.  Patient also requested medications now be sent to the [facility] located off of [address] in [place] as she recently moved.  Patient agreed to discuss Adderall refills with new provider at meeting as states she has been a week without this too.

## 2021-01-11 ENCOUNTER — Encounter (HOSPITAL_COMMUNITY): Payer: Self-pay | Admitting: Psychiatry

## 2021-01-11 ENCOUNTER — Other Ambulatory Visit: Payer: Self-pay

## 2021-01-11 ENCOUNTER — Telehealth (INDEPENDENT_AMBULATORY_CARE_PROVIDER_SITE_OTHER): Payer: BC Managed Care – PPO | Admitting: Psychiatry

## 2021-01-11 DIAGNOSIS — F411 Generalized anxiety disorder: Secondary | ICD-10-CM

## 2021-01-11 DIAGNOSIS — F902 Attention-deficit hyperactivity disorder, combined type: Secondary | ICD-10-CM | POA: Diagnosis not present

## 2021-01-11 MED ORDER — DULOXETINE HCL 60 MG PO CPEP: 60.0000 mg | ORAL_CAPSULE | Freq: Every day | ORAL | 0 refills | Status: DC

## 2021-01-11 MED ORDER — TRAZODONE HCL 50 MG PO TABS: 50.0000 mg | ORAL_TABLET | Freq: Every evening | ORAL | 2 refills | Status: DC | PRN
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
## 2021-01-12 ENCOUNTER — Telehealth (HOSPITAL_COMMUNITY): Payer: Self-pay | Admitting: *Deleted

## 2021-01-12 ENCOUNTER — Other Ambulatory Visit (HOSPITAL_COMMUNITY): Payer: Self-pay | Admitting: *Deleted

## 2021-01-12 NOTE — Telephone Encounter (Signed)
Pt called asking that her upcoming. 01/17/21, refill of Adderall be transferred to [facility] in [facility] as provided in pt Snapshot. Pt states that she never refilled in April as she did not know it was sent to [facility] in [place]. Please review and advise. Thanks.

## 2021-01-13 ENCOUNTER — Other Ambulatory Visit (HOSPITAL_COMMUNITY): Payer: Self-pay | Admitting: Psychiatry

## 2021-01-13 MED ORDER — AMPHETAMINE-DEXTROAMPHETAMINE 10 MG PO TABS: 10.0000 mg | ORAL_TABLET | Freq: Two times a day (BID) | ORAL | 0 refills | Status: DC

## 2021-01-13 NOTE — Progress Notes (Unsigned)
Patient requested that Adderall be sent to pharmacy in [place]. Adderall 10mg  bid, 60 tablets, 0 refills sent to [facility] pharmacy in [facility]. The prescription in the [facility] pharmacy has been cancelled.   [facility], MD

## 2021-02-07 ENCOUNTER — Telehealth (HOSPITAL_COMMUNITY): Payer: Self-pay

## 2021-02-07 MED ORDER — DULOXETINE HCL 60 MG PO CPEP: 60.0000 mg | ORAL_CAPSULE | Freq: Every day | ORAL | 0 refills | Status: DC

## 2021-02-07 NOTE — Telephone Encounter (Signed)
Received fax from [facility] requesting a refill on patient's Duloxetine. Dr. Daleen Bo escribed Duloxetine 60mg  on 5/17 but she will be due again. Can a 30 day bridge supply be sent in for this patient? PATIENT HAS BEEN SENT A LETTER BY CERTIFIED MAIL

## 2021-02-07 NOTE — Telephone Encounter (Signed)
Done

## 2021-03-16 ENCOUNTER — Ambulatory Visit: Payer: BC Managed Care – PPO

## 2021-06-11 IMAGING — CT CT ABD-PELV W/ CM
2 of 4 series · 16 of 46 positions shown, 18 images · IV contrast (omnipaque)
Comparison: 09/23/2018

CLINICAL DATA: Lower abdominal pain, nausea and vomiting since 10
a.m. today.

EXAM:
CT ABDOMEN AND PELVIS WITH CONTRAST
TECHNIQUE: Multidetector CT imaging of the abdomen and pelvis was performed
using the standard protocol following bolus administration of
intravenous contrast.
CONTRAST:  100mL OMNIPAQUE IOHEXOL 300 MG/ML  SOLN

[Series 3: abdomen 5.0 (person_name) · axial · 0.66mm/px · z∈[-209,+171]mm · 13 of 86 slices shown, 15 images]
[im 5/86  soft-tissue]
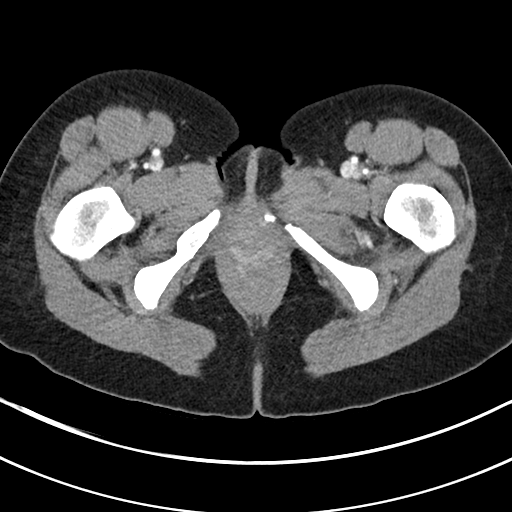
[im 5/86  bone]
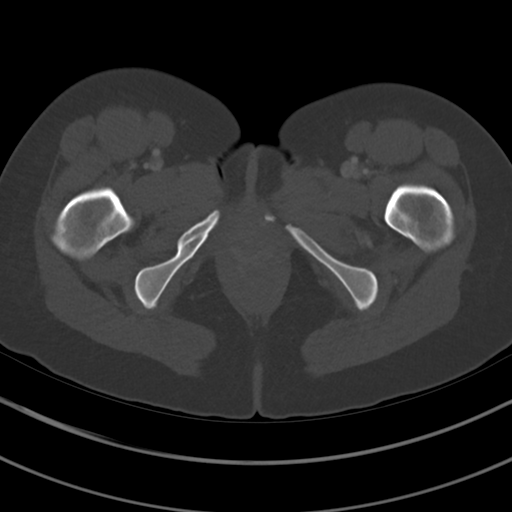
[im 10/86  soft-tissue]
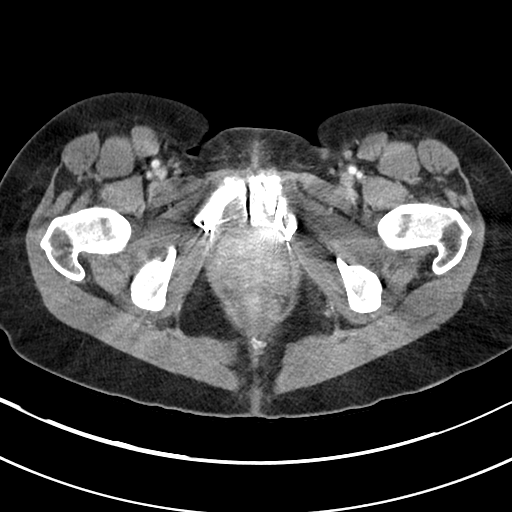
[im 19/86  soft-tissue]
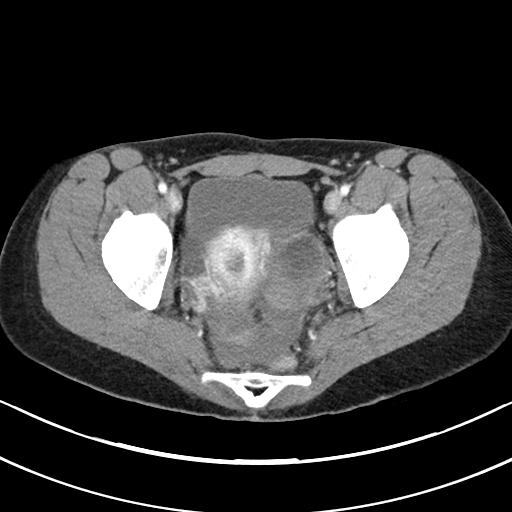
[im 24/86  soft-tissue]
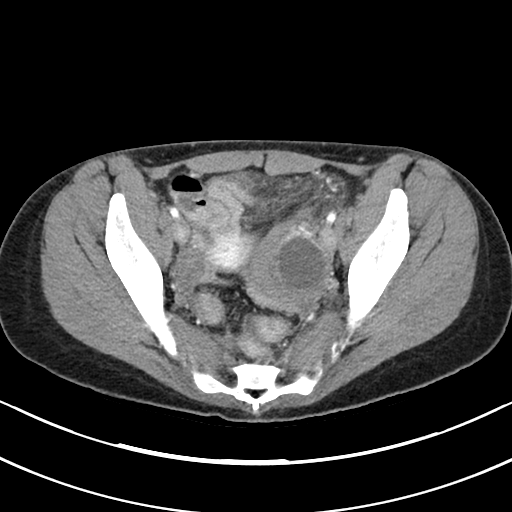
[im 29/86  soft-tissue]
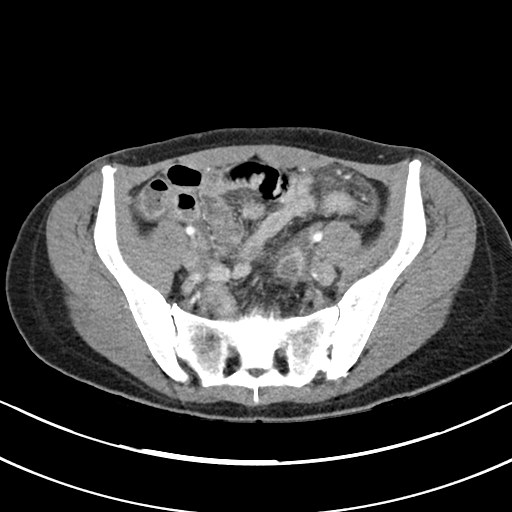
[im 38/86  soft-tissue]
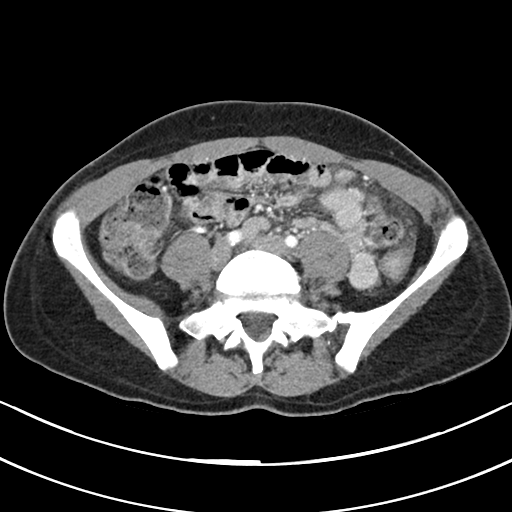
[im 43/86  soft-tissue]
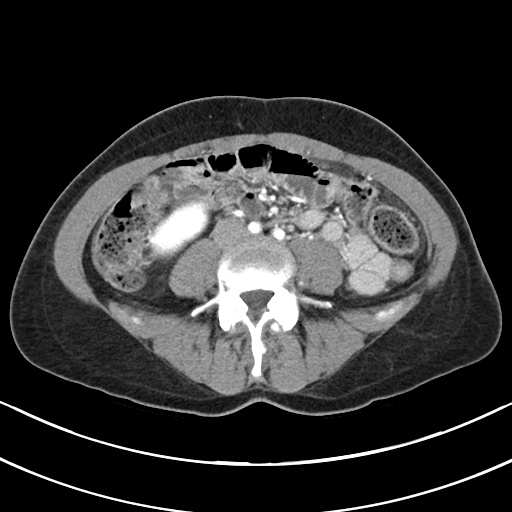
[im 48/86  soft-tissue]
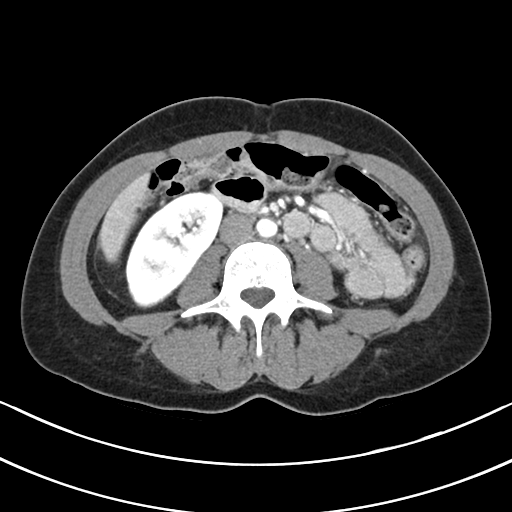
[im 57/86  soft-tissue]
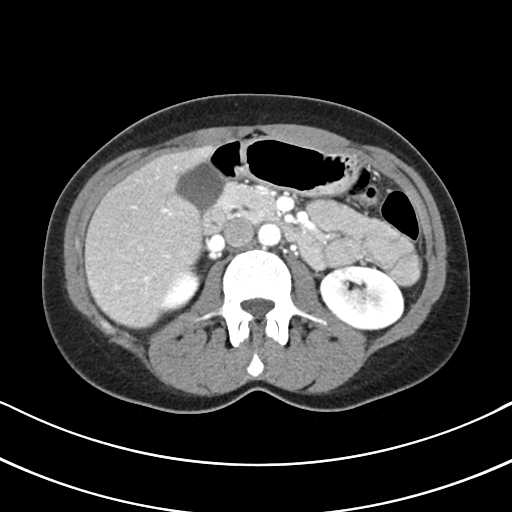
[im 57/86  bone]
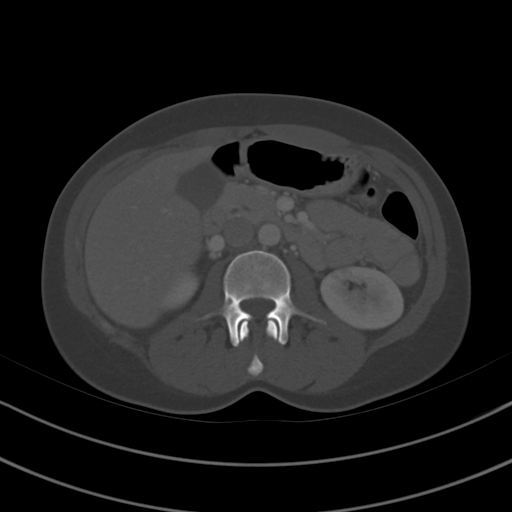
[im 62/86  soft-tissue]
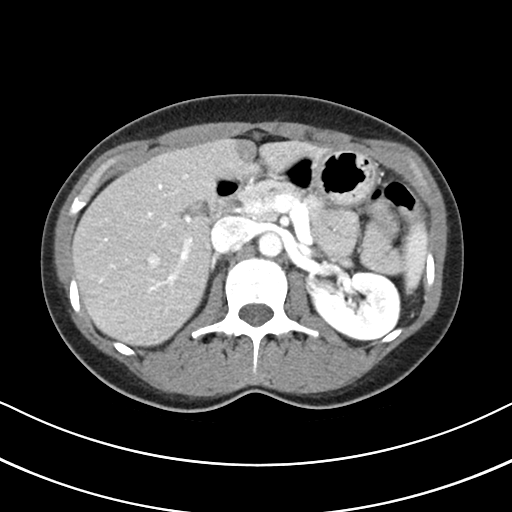
[im 67/86  soft-tissue]
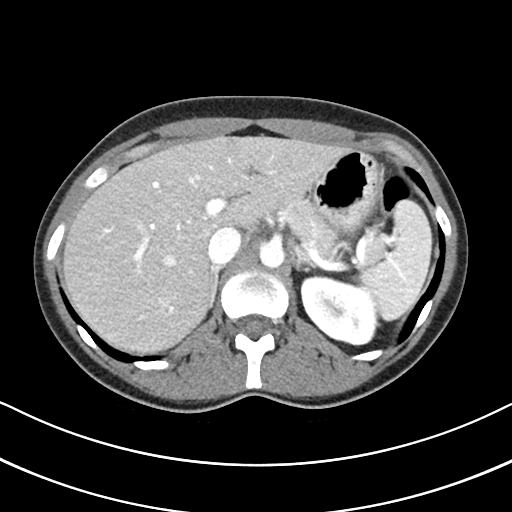
[im 76/86  soft-tissue]
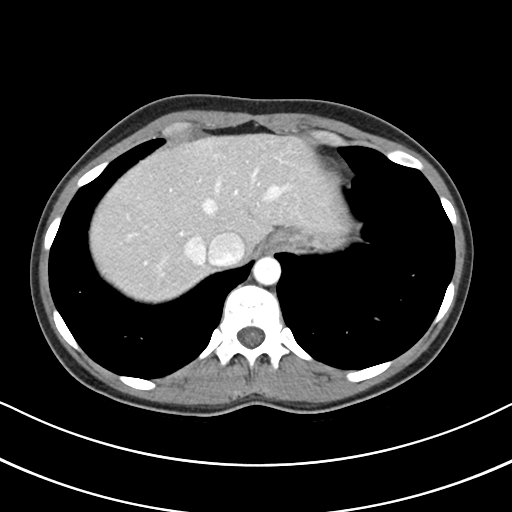
[im 81/86  soft-tissue]
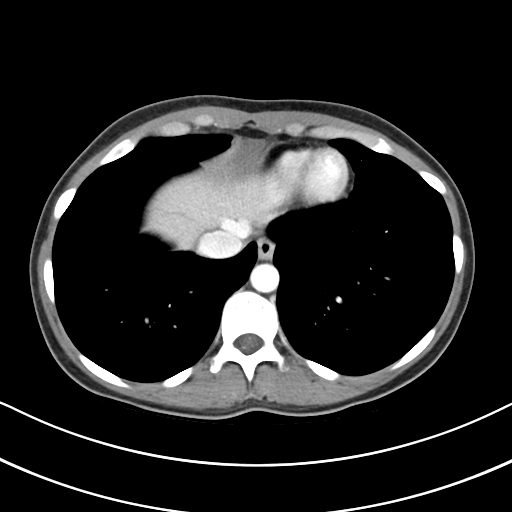

[Series 6: abdomen 3.0 (person_name) · coronal · 0.73mm/px · 3 of 84 slices shown]
[im 28/84  soft-tissue]
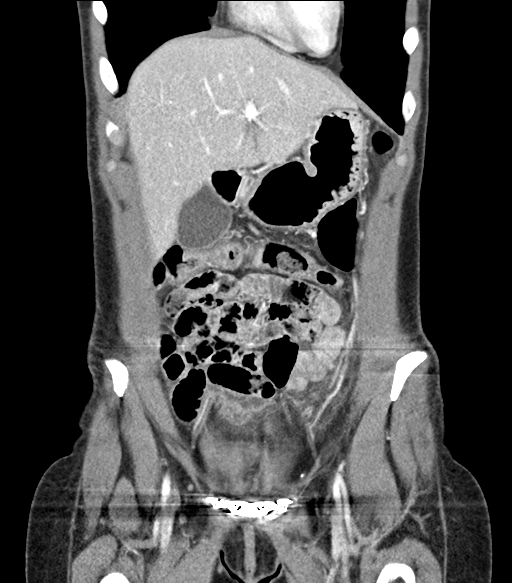
[im 37/84  soft-tissue]
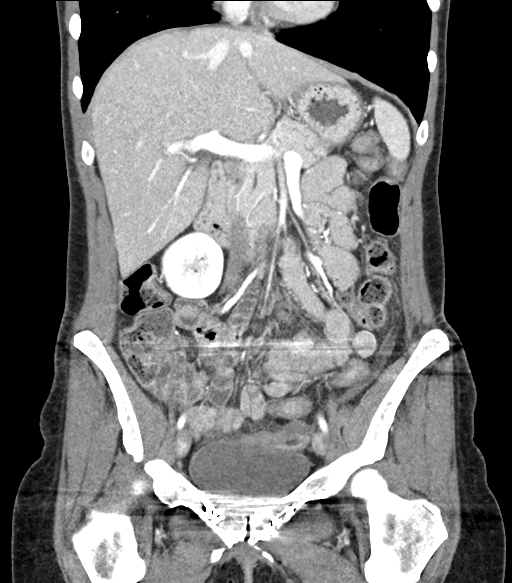
[im 47/84  soft-tissue]
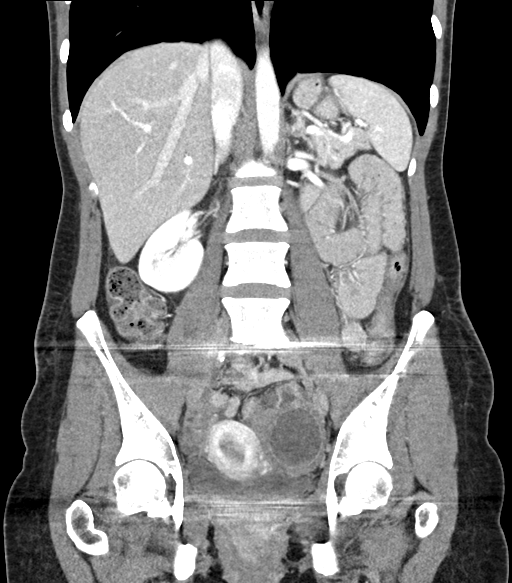

[16 of 46 positions shown; findings below may reference images not displayed]

FINDINGS: Lower chest: Unremarkable.

Hepatobiliary: Focal fat deposition adjacent to the falciform
ligament. Mild diffuse low density of the liver relative to the
spleen as well. Normal appearing gallbladder.

Pancreas: Unremarkable. No pancreatic ductal dilatation or
surrounding inflammatory changes.

Spleen: Normal in size without focal abnormality.

Adrenals/Urinary Tract: Malrotated and otherwise normal appearing
right kidney. Normal appearing adrenal glands, left kidney, ureters
and urinary bladder.

Stomach/Bowel: Unremarkable stomach, small bowel and colon. No
evidence of appendicitis.

Vascular/Lymphatic: No significant vascular findings are present. No
enlarged abdominal or pelvic lymph nodes.

Reproductive: The previously demonstrated bilateral hydrosalpinges
are no longer seen. A left ovarian resolving corpus luteum is noted.
There is also a 4.4 cm left ovarian cyst, with possible mild soft
tissue components inferiorly. Unremarkable uterus.

Other: Small amount of free peritoneal fluid in the pelvic
cul-de-sac, within normal limits of physiological fluid.

Musculoskeletal: Previously noted pelvic fixation hardware.
Bilateral L5 pars interarticularis defects with associated grade 1
anterolisthesis at the L5-S1 level. Mild anterior spur formation at
that level.
IMPRESSION: 1. 4.4 cm left ovarian cyst with possible mild soft tissue
components inferiorly. Further evaluation with a pelvic ultrasound
is recommended.
2. Mild diffuse hepatic steatosis.
3. Bilateral L5 spondylolysis with associated grade 1
spondylolisthesis at the L5-S1 level.

## 2023-08-15 ENCOUNTER — Ambulatory Visit (HOSPITAL_COMMUNITY)
Admission: EM | Admit: 2023-08-15 | Discharge: 2023-08-15 | Disposition: A | Discharge status: Home / Self Care | Payer: 59 | Attending: Emergency Medicine | Admitting: Emergency Medicine

## 2023-08-15 ENCOUNTER — Encounter (HOSPITAL_COMMUNITY): Payer: Self-pay | Admitting: Emergency Medicine

## 2023-08-15 DIAGNOSIS — L02412 Cutaneous abscess of left axilla: Secondary | ICD-10-CM | POA: Diagnosis not present

## 2023-08-15 DIAGNOSIS — L02411 Cutaneous abscess of right axilla: Secondary | ICD-10-CM

## 2023-08-15 DIAGNOSIS — R051 Acute cough: Secondary | ICD-10-CM

## 2023-08-15 MED ORDER — IBUPROFEN 800 MG PO TABS
ORAL_TABLET | ORAL | Status: AC
  Filled 2023-08-15: qty 1

## 2023-08-15 MED ORDER — IBUPROFEN 800 MG PO TABS
800.0000 mg | ORAL_TABLET | Freq: Three times a day (TID) | ORAL | 0 refills | Status: DC
Start: 2023-08-15 — End: 2023-12-10

## 2023-08-15 MED ORDER — ALBUTEROL SULFATE HFA 108 (90 BASE) MCG/ACT IN AERS: 2.0000 | INHALATION_SPRAY | Freq: Four times a day (QID) | RESPIRATORY_TRACT | 1 refills | Status: DC | PRN

## 2023-08-15 MED ORDER — IBUPROFEN 800 MG PO TABS
800.0000 mg | ORAL_TABLET | Freq: Once | ORAL | Status: AC
  Administered 2023-08-15: 800 mg via ORAL

## 2023-08-15 MED ORDER — DOXYCYCLINE HYCLATE 100 MG PO CAPS: 100.0000 mg | ORAL_CAPSULE | Freq: Two times a day (BID) | ORAL | 0 refills | Status: AC

## 2023-08-15 NOTE — ED Provider Notes (Signed)
[facility]    CSN: [id number] Arrival date & time: 08/15/23  1740     History   Chief Complaint Chief Complaint  Patient presents with   Abscess    HPI Morgan Donaldson is a 42 y.o. female.  Yesterday noted a tender lump in her left axilla.  Last night she felt there was another starting in the right axilla.  Today the right side acutely worsened.  Red and painful.  No drainage.  No fevers. She does not have history of this She recently shaved the areas   Also requesting albuterol inhaler for bronchitis. Was seen 12/8 and prescribed meds but didn't pick up  Past Medical History:  Diagnosis Date   Bronchitis    Hydrosalpinx    bilateral   Palpitations    Physiological ovarian cysts     Patient Active Problem List   Diagnosis Date Noted   GAD (generalized anxiety disorder) 06/21/2020   Attention deficit hyperactivity disorder (ADHD), combined type 06/21/2020   Breakthrough bleeding on depo provera 05/12/2020   Pap smear abnormality of cervix/human papillomavirus (HPV) positive 08/17/2019   Hemorrhagic cyst of left ovary 08/11/2019   Tobacco abuse 05/19/2019   History of drug abuse in remission (HCC) 05/19/2019    Past Surgical History:  Procedure Laterality Date   ORIF PELVIC FRACTURE      OB History     Gravida  0   Para  0   Term  0   Preterm  0   AB  0   Living  0      SAB  0   IAB  0   Ectopic  0   Multiple  0   Live Births  0            Home Medications    Prior to Admission medications   Medication Sig Start Date End Date Taking? Authorizing Provider  albuterol (VENTOLIN HFA) 108 (90 Base) MCG/ACT inhaler Inhale 2 puffs into the lungs every 6 (six) hours as needed for wheezing or shortness of breath. 08/15/23  Yes Doss Cybulski, Lurena Joiner, PA-C  doxycycline (VIBRAMYCIN) 100 MG capsule Take 1 capsule (100 mg total) by mouth 2 (two) times daily for 7 days. 08/15/23 08/22/23 Yes Dashanae Longfield, Lurena Joiner, PA-C  ibuprofen (ADVIL) 800 MG  tablet Take 1 tablet (800 mg total) by mouth 3 (three) times daily. 08/15/23  Yes Malayjah Otoole, Lurena Joiner, PA-C    Family History Family History  Problem Relation Age of Onset   Lung cancer Mother        died at age 88   CAD Mother        MI at age 70   Hypertension Mother    Schizophrenia Father    Hypertension Sister    Hyperlipidemia Sister    COPD Sister    Healthy Brother    Healthy Sister     Social History Social History   Tobacco Use   Smoking status: Former    Current packs/day: 0.00    Types: Cigarettes    Quit date: 10/27/2019    Years since quitting: 3.8   Smokeless tobacco: Never  Substance Use Topics   Alcohol use: No   Drug use: Yes    Types: Marijuana     Allergies   Patient has no known allergies.   Review of Systems Review of Systems Per HPI  Physical Exam Triage Vital Signs ED Triage Vitals  Encounter Vitals Group     BP 08/15/23 1844 112/68  Systolic BP Percentile --      Diastolic BP Percentile --      Pulse Rate 08/15/23 1844 69     Resp 08/15/23 1844 16     Temp 08/15/23 1844 98 F (36.7 C)     Temp Source 08/15/23 1844 Oral     SpO2 08/15/23 1844 98 %     Weight --      Height --      Head Circumference --      Peak Flow --      Pain Score 08/15/23 1842 6     Pain Loc --      Pain Education --      Exclude from Growth Chart --    No data found.  Updated Vital Signs BP 112/68 (BP Location: Left Arm)   Pulse 69   Temp 98 F (36.7 C) (Oral)   Resp 16   LMP 07/31/2023 (Approximate)   SpO2 98%   Physical Exam Vitals and nursing note reviewed.  Constitutional:      General: She is not in acute distress.    Appearance: Normal appearance.  HENT:     Mouth/Throat:     Mouth: Mucous membranes are moist.     Pharynx: Oropharynx is clear.  Eyes:     Conjunctiva/sclera: Conjunctivae normal.  Cardiovascular:     Rate and Rhythm: Normal rate and regular rhythm.     Heart sounds: Normal heart sounds.  Pulmonary:     Effort:  Pulmonary effort is normal.     Breath sounds: Normal breath sounds.     Comments: Dry cough in clinic Musculoskeletal:     Cervical back: Normal range of motion.  Skin:    Findings: Abscess present.     Comments: Small firm erythematous area of left axilla. Small firm erythematous and tender area of right axilla. Indurated. No draining   Neurological:     Mental Status: She is alert and oriented to person, place, and time.     UC Treatments / Results  Labs (all labs ordered are listed, but only abnormal results are displayed) Labs Reviewed - No data to display  EKG  Radiology No results found.  Procedures Procedures   Medications Ordered in UC Medications  ibuprofen (ADVIL) tablet 800 mg (800 mg Oral Given 08/15/23 1941)    Initial Impression / Assessment and Plan / UC Course  I have reviewed the triage vital signs and the nursing notes.  Pertinent labs & imaging results that were available during my care of the patient were reviewed by me and considered in my medical decision making (see chart for details).  Axillary abscess, R > L. Likely from recent shaving. Cover with doxy BID x 7, advised warm compress, ibuprofen and/or tylenol. Ibu dose given in clinic per patient request Advised return precautions Sent inhaler per request. Discussed doxy would also treat her cough. Was originally prescribed azithromycin (2 weeks ago) but didn't pick up. Work note provided  Final Clinical Impressions(s) / UC Diagnoses   Final diagnoses:  Abscess of axilla, right  Abscess of left axilla  Acute cough     Discharge Instructions      Please take doxycyline (antibiotic) as prescribed. Take with food to avoid upset stomach. Finish the full course!  Warm compress several times daily Ibuprofen can be used every 6 hours for pain  Please return if no change after 4 full days on the antibiotic     ED Prescriptions  Medication Sig Dispense Auth. Provider   ibuprofen  (ADVIL) 800 MG tablet Take 1 tablet (800 mg total) by mouth 3 (three) times daily. 21 tablet Marci Polito, PA-C   doxycycline (VIBRAMYCIN) 100 MG capsule Take 1 capsule (100 mg total) by mouth 2 (two) times daily for 7 days. 14 capsule Tyaira Heward, PA-C   albuterol (VENTOLIN HFA) 108 (90 Base) MCG/ACT inhaler Inhale 2 puffs into the lungs every 6 (six) hours as needed for wheezing or shortness of breath. 8 g Sharel Behne, Lurena Joiner, PA-C      PDMP not reviewed this encounter.   Bell Carbo, Ray Church 08/15/23 2000

## 2023-08-15 NOTE — ED Triage Notes (Signed)
Pt present abscess under both armpits. States right side is much more painful. First noticed around thurday

## 2023-08-15 NOTE — Discharge Instructions (Addendum)
Please take doxycyline (antibiotic) as prescribed. Take with food to avoid upset stomach. Finish the full course!  Warm compress several times daily Ibuprofen can be used every 6 hours for pain  Please return if no change after 4 full days on the antibiotic

## 2023-09-20 ENCOUNTER — Encounter (HOSPITAL_COMMUNITY): Payer: Self-pay | Admitting: Emergency Medicine

## 2023-09-20 ENCOUNTER — Emergency Department (HOSPITAL_COMMUNITY)
Admission: EM | Admit: 2023-09-20 | Discharge: 2023-09-21 | Disposition: A | Discharge status: Home / Self Care | Payer: 59 | Attending: Student | Admitting: Student

## 2023-09-20 ENCOUNTER — Other Ambulatory Visit: Payer: Self-pay

## 2023-09-20 DIAGNOSIS — F1490 Cocaine use, unspecified, uncomplicated: Secondary | ICD-10-CM | POA: Insufficient documentation

## 2023-09-20 DIAGNOSIS — R112 Nausea with vomiting, unspecified: Secondary | ICD-10-CM | POA: Insufficient documentation

## 2023-09-20 DIAGNOSIS — F1410 Cocaine abuse, uncomplicated: Secondary | ICD-10-CM

## 2023-09-20 DIAGNOSIS — R1013 Epigastric pain: Secondary | ICD-10-CM

## 2023-09-20 DIAGNOSIS — Z87891 Personal history of nicotine dependence: Secondary | ICD-10-CM | POA: Insufficient documentation

## 2023-09-20 DIAGNOSIS — R109 Unspecified abdominal pain: Secondary | ICD-10-CM | POA: Insufficient documentation

## 2023-09-20 LAB — CBC WITH DIFFERENTIAL/PLATELET
Abs Immature Granulocytes: 0.12 10*3/uL — ABNORMAL HIGH (ref 0.00–0.07)
Basophils Absolute: 0.1 10*3/uL (ref 0.0–0.1)
Basophils Relative: 0 %
Eosinophils Absolute: 0.1 10*3/uL (ref 0.0–0.5)
Eosinophils Relative: 1 %
HCT: 51.1 % — ABNORMAL HIGH (ref 36.0–46.0)
Hemoglobin: 16.6 g/dL — ABNORMAL HIGH (ref 12.0–15.0)
Immature Granulocytes: 1 %
Lymphocytes Relative: 5 %
Lymphs Abs: 1 10*3/uL (ref 0.7–4.0)
MCH: 30.7 pg (ref 26.0–34.0)
MCHC: 32.5 g/dL (ref 30.0–36.0)
MCV: 94.5 fL (ref 80.0–100.0)
Monocytes Absolute: 0.7 10*3/uL (ref 0.1–1.0)
Monocytes Relative: 4 %
Neutro Abs: 18.2 10*3/uL — ABNORMAL HIGH (ref 1.7–7.7)
Neutrophils Relative %: 89 %
Platelets: 289 10*3/uL (ref 150–400)
RBC: 5.41 MIL/uL — ABNORMAL HIGH (ref 3.87–5.11)
RDW: 12.9 % (ref 11.5–15.5)
WBC: 20.3 10*3/uL — ABNORMAL HIGH (ref 4.0–10.5)
nRBC: 0 % (ref 0.0–0.2)

## 2023-09-20 LAB — COMPREHENSIVE METABOLIC PANEL
ALT: 15 U/L (ref 0–44)
AST: 20 U/L (ref 15–41)
Albumin: 4.7 g/dL (ref 3.5–5.0)
Alkaline Phosphatase: 49 U/L (ref 38–126)
Anion gap: 13 (ref 5–15)
BUN: 15 mg/dL (ref 6–20)
CO2: 19 mmol/L — ABNORMAL LOW (ref 22–32)
Calcium: 9.5 mg/dL (ref 8.9–10.3)
Chloride: 107 mmol/L (ref 98–111)
Creatinine, Ser: 0.72 mg/dL (ref 0.44–1.00)
GFR, Estimated: >60 mL/min (ref >60)
Glucose, Bld: 140 mg/dL — ABNORMAL HIGH (ref 70–99)
Potassium: 4.1 mmol/L (ref 3.5–5.1)
Sodium: 139 mmol/L (ref 135–145)
Total Bilirubin: 0.7 mg/dL (ref 0.0–1.2)
Total Protein: 7.9 g/dL (ref 6.5–8.1)

## 2023-09-20 LAB — LIPASE, BLOOD: Lipase: 29 U/L (ref 11–51)

## 2023-09-20 MED ORDER — ONDANSETRON 4 MG PO TBDP
4.0000 mg | ORAL_TABLET | Freq: Once | ORAL | Status: AC
  Administered 2023-09-20: 4 mg via ORAL

## 2023-09-20 NOTE — ED Triage Notes (Signed)
  Patient comes in with N/V that started about an hour ago.  Patient states she has smoked crack several times today and does not feel well.  Vitals stable during triage.

## 2023-09-21 DIAGNOSIS — F1490 Cocaine use, unspecified, uncomplicated: Secondary | ICD-10-CM | POA: Diagnosis not present

## 2023-09-21 MED ORDER — LIDOCAINE VISCOUS HCL 2 % MT SOLN
15.0000 mL | Freq: Once | OROMUCOSAL | Status: AC
Start: 2023-09-21 — End: 2023-09-21
  Administered 2023-09-21: 15 mL via ORAL
  Filled 2023-09-21: qty 15

## 2023-09-21 MED ORDER — ALUM & MAG HYDROXIDE-SIMETH 200-200-20 MG/5ML PO SUSP
30.0000 mL | Freq: Once | ORAL | Status: AC
  Administered 2023-09-21: 30 mL via ORAL
  Filled 2023-09-21: qty 30

## 2023-09-21 MED ORDER — FAMOTIDINE 20 MG PO TABS
20.0000 mg | ORAL_TABLET | Freq: Two times a day (BID) | ORAL | 0 refills | Status: DC
Start: 2023-09-21 — End: 2023-12-10

## 2023-09-21 NOTE — ED Provider Notes (Signed)
[place] EMERGENCY DEPARTMENT AT [facility] Provider Note  CSN: [id number] Arrival date & time: 09/20/23 2246  Chief Complaint(s) Drug Problem  HPI Morgan Donaldson is a 43 y.o. female with PMH polysubstance abuse previously in remission, hydrosalpinx, generalized anxiety disorder who presents emergency room for evaluation of nausea and vomiting after smoking crack cocaine.  She states that she has not smoked in a very long time and recently relapsed this afternoon.  Had associated nausea and vomiting.  Nausea and vomiting has resolved but she has persistent epigastric pain that started after vomiting.  Denies current chest pain, shortness of breath, headache, fever or other systemic symptoms.   Past Medical History Past Medical History:  Diagnosis Date   Bronchitis    Hydrosalpinx    bilateral   Palpitations    Physiological ovarian cysts    Patient Active Problem List   Diagnosis Date Noted   GAD (generalized anxiety disorder) 06/21/2020   Attention deficit hyperactivity disorder (ADHD), combined type 06/21/2020   Breakthrough bleeding on depo provera 05/12/2020   Pap smear abnormality of cervix/human papillomavirus (HPV) positive 08/17/2019   Hemorrhagic cyst of left ovary 08/11/2019   Tobacco abuse 05/19/2019   History of drug abuse in remission (HCC) 05/19/2019   Home Medication(s) Prior to Admission medications   Medication Sig Start Date End Date Taking? Authorizing Provider  albuterol (VENTOLIN HFA) 108 (90 Base) MCG/ACT inhaler Inhale 2 puffs into the lungs every 6 (six) hours as needed for wheezing or shortness of breath. 08/15/23   Rising, Lurena Joiner, PA-C  ibuprofen (ADVIL) 800 MG tablet Take 1 tablet (800 mg total) by mouth 3 (three) times daily. 08/15/23   Rising, Ray Church                                                                                                                                    Past Surgical History Past Surgical History:   Procedure Laterality Date   ORIF PELVIC FRACTURE     Family History Family History  Problem Relation Age of Onset   Lung cancer Mother        died at age 14   CAD Mother        MI at age 89   Hypertension Mother    Schizophrenia Father    Hypertension Sister    Hyperlipidemia Sister    COPD Sister    Healthy Brother    Healthy Sister     Social History Social History   Tobacco Use   Smoking status: Former    Current packs/day: 0.00    Types: Cigarettes    Quit date: 10/27/2019    Years since quitting: 3.9   Smokeless tobacco: Never  Substance Use Topics   Alcohol use: No   Drug use: Yes    Types: Marijuana   Allergies Patient has no known allergies.  Review of Systems Review of Systems  Gastrointestinal:  Positive  for abdominal pain, nausea and vomiting.    Physical Exam Vital Signs  I have reviewed the triage vital signs BP 126/82 (BP Location: Right Arm)   Pulse 81   Temp 98.7 F (37.1 C) (Oral)   Resp (!) 26   Ht 5\' 7"  (1.702 m)   Wt 54.4 kg   SpO2 98%   BMI 18.79 kg/m   Physical Exam Vitals and nursing note reviewed.  Constitutional:      General: She is not in acute distress.    Appearance: She is well-developed.  HENT:     Head: Normocephalic and atraumatic.  Eyes:     Conjunctiva/sclera: Conjunctivae normal.  Cardiovascular:     Rate and Rhythm: Normal rate and regular rhythm.     Heart sounds: No murmur heard. Pulmonary:     Effort: Pulmonary effort is normal. No respiratory distress.     Breath sounds: Normal breath sounds.  Abdominal:     Palpations: Abdomen is soft.     Tenderness: There is abdominal tenderness.  Musculoskeletal:        General: No swelling.     Cervical back: Neck supple.  Skin:    General: Skin is warm and dry.     Capillary Refill: Capillary refill takes less than 2 seconds.  Neurological:     Mental Status: She is alert.  Psychiatric:        Mood and Affect: Mood normal.     ED Results and  Treatments Labs (all labs ordered are listed, but only abnormal results are displayed) Labs Reviewed  CBC WITH DIFFERENTIAL/PLATELET - Abnormal; Notable for the following components:      Result Value   WBC 20.3 (*)    RBC 5.41 (*)    Hemoglobin 16.6 (*)    HCT 51.1 (*)    Neutro Abs 18.2 (*)    Abs Immature Granulocytes 0.12 (*)    All other components within normal limits  COMPREHENSIVE METABOLIC PANEL - Abnormal; Notable for the following components:   CO2 19 (*)    Glucose, Bld 140 (*)    All other components within normal limits  LIPASE, BLOOD  URINALYSIS, ROUTINE W REFLEX MICROSCOPIC  RAPID URINE DRUG SCREEN, HOSP PERFORMED                                                                                                                          Radiology No results found.  Pertinent labs & imaging results that were available during my care of the patient were reviewed by me and considered in my medical decision making (see MDM for details).  Medications Ordered in ED Medications  alum & mag hydroxide-simeth (MAALOX/MYLANTA) 200-200-20 MG/5ML suspension 30 mL (has no administration in time range)    And  lidocaine (XYLOCAINE) 2 % viscous mouth solution 15 mL (has no administration in time range)  ondansetron (ZOFRAN-ODT) disintegrating tablet 4 mg (4 mg Oral Given 09/20/23 2312)  Procedures Procedures  (including critical care time)  Medical Decision Making / ED Course   This patient presents to the ED for concern of abdominal pain, cocaine use, this involves an extensive number of treatment options, and is a complaint that carries with it a high risk of complications and morbidity.  The differential diagnosis includes polysubstance use, gastritis, pancreatitis, cholecystitis  MDM: Patient seen emergency room for evaluation of abdominal  pain after a car cocaine use.  Physical exam largely unremarkable outside of some mild epigastric tenderness to palpation.  Laboratory evaluation with a leukocytosis to 20.3, likely stress demargination in setting of recent vomiting and crack cocaine use.  Hemoglobin 16.6, CO2 19.  Lipase normal at 29.  Patient had waited in the lobby for multiple hours and at the time of my evaluation in the emergency department, symptoms have already started to improve.  GI cocktail given and on reevaluation symptoms have significantly improved and she is able to tolerate p.o. without difficulty.  She was counseled against using crack cocaine in the future which she voiced understanding.  Given return precautions of which she voiced understanding but at this time she does not meet inpatient criteria for admission.   Additional history obtained: -External records from outside source obtained and reviewed including: Chart review including previous notes, labs, imaging, consultation notes   Lab Tests: -I ordered, reviewed, and interpreted labs.   The pertinent results include:   Labs Reviewed  CBC WITH DIFFERENTIAL/PLATELET - Abnormal; Notable for the following components:      Result Value   WBC 20.3 (*)    RBC 5.41 (*)    Hemoglobin 16.6 (*)    HCT 51.1 (*)    Neutro Abs 18.2 (*)    Abs Immature Granulocytes 0.12 (*)    All other components within normal limits  COMPREHENSIVE METABOLIC PANEL - Abnormal; Notable for the following components:   CO2 19 (*)    Glucose, Bld 140 (*)    All other components within normal limits  LIPASE, BLOOD  URINALYSIS, ROUTINE W REFLEX MICROSCOPIC  RAPID URINE DRUG SCREEN, HOSP PERFORMED      Medicines ordered and prescription drug management: Meds ordered this encounter  Medications   ondansetron (ZOFRAN-ODT) disintegrating tablet 4 mg   AND Linked Order Group    alum & mag hydroxide-simeth (MAALOX/MYLANTA) 200-200-20 MG/5ML suspension 30 mL    lidocaine (XYLOCAINE)  2 % viscous mouth solution 15 mL    -I have reviewed the patients home medicines and have made adjustments as needed  Critical interventions none    Cardiac Monitoring: The patient was maintained on a cardiac monitor.  I personally viewed and interpreted the cardiac monitored which showed an underlying rhythm of: NSR  Social Determinants of Health:  Factors impacting patients care include: none   Reevaluation: After the interventions noted above, I reevaluated the patient and found that they have :improved  Co morbidities that complicate the patient evaluation  Past Medical History:  Diagnosis Date   Bronchitis    Hydrosalpinx    bilateral   Palpitations    Physiological ovarian cysts       Dispostion: I considered admission for this patient, but at this time she does not meet inpatient criteria for admission and will be discharged with outpatient follow-up     Final Clinical Impression(s) / ED Diagnoses Final diagnoses:  None     @PCDICTATION @    Glendora Score, MD 09/21/23 [phone]

## 2023-11-13 ENCOUNTER — Ambulatory Visit

## 2023-11-29 ENCOUNTER — Other Ambulatory Visit: Payer: Self-pay

## 2023-11-29 ENCOUNTER — Encounter (HOSPITAL_COMMUNITY): Payer: Self-pay

## 2023-11-29 ENCOUNTER — Emergency Department (HOSPITAL_COMMUNITY)
Admission: EM | Admit: 2023-11-29 | Discharge: 2023-11-29 | Disposition: A | Discharge status: Home / Self Care | Attending: Emergency Medicine | Admitting: Emergency Medicine

## 2023-11-29 ENCOUNTER — Emergency Department (HOSPITAL_COMMUNITY)

## 2023-11-29 DIAGNOSIS — W010XXA Fall on same level from slipping, tripping and stumbling without subsequent striking against object, initial encounter: Secondary | ICD-10-CM | POA: Diagnosis not present

## 2023-11-29 DIAGNOSIS — S43402A Unspecified sprain of left shoulder joint, initial encounter: Secondary | ICD-10-CM | POA: Insufficient documentation

## 2023-11-29 DIAGNOSIS — M25512 Pain in left shoulder: Secondary | ICD-10-CM | POA: Diagnosis present

## 2023-11-29 MED ORDER — IBUPROFEN 200 MG PO TABS: 400.0000 mg | ORAL_TABLET | Freq: Once | ORAL | Status: DC

## 2023-11-29 MED ORDER — METHOCARBAMOL 750 MG PO TABS: 750.0000 mg | ORAL_TABLET | Freq: Three times a day (TID) | ORAL | 0 refills | Status: DC | PRN

## 2023-11-29 MED ORDER — ACETAMINOPHEN 325 MG PO TABS: 650.0000 mg | ORAL_TABLET | Freq: Once | ORAL | Status: DC

## 2023-11-29 NOTE — Progress Notes (Signed)
 Orthopedic Tech Progress Note Patient Details:  Morgan Donaldson Dec 08, 1980 [id number]  Ortho Devices Type of Ortho Device: Shoulder immobilizer Ortho Device/Splint Location: left Ortho Device/Splint Interventions: Ordered, Application, Adjustment   Post Interventions Patient Tolerated: Well Instructions Provided: Adjustment of device, Care of device  Kizzie Fantasia 11/29/2023, 5:53 PM

## 2023-11-29 NOTE — ED Provider Notes (Signed)
 [place] EMERGENCY DEPARTMENT AT [facility] Provider Note   CSN: [id number] Arrival date & time: 11/29/23  1313     History  Chief Complaint  Patient presents with   Shoulder Injury    Morgan Donaldson is a 43 y.o. female.  Pt c/o left shoulder pain for the past 4 days after a mechanical fall onto outstretched arm. Constant, dull pain, worse w certain movements of left shoulder. Denies elbow or wrist pain. No swelling. No numbness/weakness. Skin intact. Denies faintness or dizziness prior to fall. No anticoagulant use. No loc. No headache. No neck/back pain. No other extremity pain or injury. Ambulatory since. No hx chronic shoulder problems.   The history is provided by the patient and medical records.  Shoulder Injury Pertinent negatives include no chest pain, no abdominal pain, no headaches and no shortness of breath.       Home Medications Prior to Admission medications   Medication Sig Start Date End Date Taking? Authorizing Provider  methocarbamol (ROBAXIN) 750 MG tablet Take 1 tablet (750 mg total) by mouth 3 (three) times daily as needed (muscle spasm/pain). 11/29/23  Yes Cathren Laine, MD  albuterol (VENTOLIN HFA) 108 (90 Base) MCG/ACT inhaler Inhale 2 puffs into the lungs every 6 (six) hours as needed for wheezing or shortness of breath. 08/15/23   Rising, Lurena Joiner, PA-C  famotidine (PEPCID) 20 MG tablet Take 1 tablet (20 mg total) by mouth 2 (two) times daily. 09/21/23   Kommor, Madison, MD  ibuprofen (ADVIL) 800 MG tablet Take 1 tablet (800 mg total) by mouth 3 (three) times daily. 08/15/23   Rising, Lurena Joiner, PA-C      Allergies    Patient has no known allergies.    Review of Systems   Review of Systems  Constitutional:  Negative for fever.  Respiratory:  Negative for shortness of breath.   Cardiovascular:  Negative for chest pain.  Gastrointestinal:  Negative for abdominal pain and vomiting.  Genitourinary:  Negative for flank pain.  Musculoskeletal:   Negative for back pain and neck pain.  Skin:  Negative for wound.  Neurological:  Negative for weakness, numbness and headaches.    Physical Exam Updated Vital Signs BP 125/72 (BP Location: Right Arm)   Pulse 79   Temp 98.5 F (36.9 C) (Oral)   Resp 17   Ht 1.702 m (5\' 7" )   Wt 50.8 kg   SpO2 99%   BMI 17.54 kg/m  Physical Exam Vitals and nursing note reviewed.  Constitutional:      Appearance: Normal appearance. She is well-developed.  HENT:     Head: Atraumatic.     Nose: Nose normal.     Mouth/Throat:     Mouth: Mucous membranes are moist.  Eyes:     General: No scleral icterus.    Conjunctiva/sclera: Conjunctivae normal.     Pupils: Pupils are equal, round, and reactive to light.  Neck:     Trachea: No tracheal deviation.  Cardiovascular:     Rate and Rhythm: Normal rate.     Pulses: Normal pulses.     Heart sounds: Normal heart sounds. No murmur heard.    No friction rub. No gallop.  Pulmonary:     Effort: Pulmonary effort is normal. No respiratory distress.     Breath sounds: Normal breath sounds.  Abdominal:     General: There is no distension.     Palpations: Abdomen is soft.     Tenderness: There is no abdominal tenderness.  Musculoskeletal:  General: No swelling.     Cervical back: Normal range of motion and neck supple. No muscular tenderness.     Comments: Tenderness left shoulder posteriorly. No gross sts noted. Skin intact. No deformity. LUE is of normal color and warmth. No swelling. Pain w active abduction of left shoulder. Good passive rom without pain left shoulder, elbow or wrist.  No other focal bony tenderness. Radial pulse 2+  Skin:    General: Skin is warm and dry.     Findings: No rash.  Neurological:     Mental Status: She is alert.     Comments: Alert, speech normal. Motor/sens grossly intact bil. LUE nvi. Steady gait.   Psychiatric:        Mood and Affect: Mood normal.     ED Results / Procedures / Treatments   Labs (all  labs ordered are listed, but only abnormal results are displayed) Labs Reviewed - No data to display  EKG None  Radiology DG Shoulder Left Result Date: 11/29/2023 CLINICAL DATA:  Four day history of posterior left shoulder pain after fall EXAM: LEFT SHOULDER - 3 VIEW COMPARISON:  None Available. FINDINGS: There is no evidence of fracture or dislocation. There is no evidence of arthropathy or other focal bone abnormality. Soft tissues are unremarkable. IMPRESSION: No acute fracture or dislocation. Electronically Signed   By: Agustin Cree M.D.   On: 11/29/2023 14:32    Procedures Procedures    Medications Ordered in ED Medications  ibuprofen (ADVIL) tablet 400 mg (has no administration in time range)  acetaminophen (TYLENOL) tablet 650 mg (has no administration in time range)    ED Course/ Medical Decision Making/ A&P                                 Medical Decision Making Problems Addressed: Fall from slip, trip, or stumble, initial encounter: acute illness or injury with systemic symptoms Sprain of left shoulder, unspecified shoulder sprain type, initial encounter: acute illness or injury  Amount and/or Complexity of Data Reviewed External Data Reviewed: notes. Radiology: ordered and independent interpretation performed. Decision-making details documented in ED Course.  Risk OTC drugs. Prescription drug management.   Xrays.  Reviewed nursing notes and prior charts for additional history.   Acetaminophen po, ibuprofen po. Shoulder sling/imm.   Xrays reviewed/interpreted by me - no fx or disloc.   Discussed diff incl sprain/strain, labrum tear, rotator cuff tear, etc.   Ortho f/u.  Rx for home.   Pt appears stable for d/c.           Final Clinical Impression(s) / ED Diagnoses Final diagnoses:  Fall from slip, trip, or stumble, initial encounter  Sprain of left shoulder, unspecified shoulder sprain type, initial encounter    Rx / DC Orders ED Discharge  Orders          Ordered    methocarbamol (ROBAXIN) 750 MG tablet  3 times daily PRN        11/29/23 1750              Cathren Laine, MD 11/29/23 1750

## 2023-11-29 NOTE — Discharge Instructions (Signed)
 It was our pleasure to provide your ER care today - we hope that you feel better. As we discussed, your xrays show no fracture - there is the possibility of soft tissue injury such as tear of shoulder capsule/labrum or rotator cuff muscular strain or tear.   Take acetaminophen or ibuprofen as need for pain. You may also take robaxin as need for muscle pain/spasm - no driving when taking.   Follow up with orthopedic doctor in the next 1-2 weeks - call office to arrange appointment.   Return to ER if worse, new symptoms, new/severe pain, trouble breathing, or other emergency concern.

## 2023-11-29 NOTE — ED Triage Notes (Signed)
 Pt fell 4 days ago and injured her left shoulder. Pt is unable to lift shoulder above her head. States she did hit her head, is not on blood thinners. No LOC.

## 2023-12-07 ENCOUNTER — Emergency Department (HOSPITAL_COMMUNITY)

## 2023-12-07 ENCOUNTER — Other Ambulatory Visit: Payer: Self-pay

## 2023-12-07 ENCOUNTER — Inpatient Hospital Stay (HOSPITAL_COMMUNITY)
Admission: EM | Admit: 2023-12-07 | Discharge: 2023-12-10 | DRG: 872 | Disposition: A | Discharge status: Home / Self Care | Attending: Internal Medicine | Admitting: Internal Medicine

## 2023-12-07 DIAGNOSIS — Z825 Family history of asthma and other chronic lower respiratory diseases: Secondary | ICD-10-CM

## 2023-12-07 DIAGNOSIS — Z1152 Encounter for screening for COVID-19: Secondary | ICD-10-CM

## 2023-12-07 DIAGNOSIS — R0789 Other chest pain: Secondary | ICD-10-CM | POA: Diagnosis present

## 2023-12-07 DIAGNOSIS — R64 Cachexia: Secondary | ICD-10-CM | POA: Diagnosis present

## 2023-12-07 DIAGNOSIS — N3000 Acute cystitis without hematuria: Secondary | ICD-10-CM | POA: Diagnosis present

## 2023-12-07 DIAGNOSIS — B9620 Unspecified Escherichia coli [E. coli] as the cause of diseases classified elsewhere: Secondary | ICD-10-CM | POA: Diagnosis present

## 2023-12-07 DIAGNOSIS — Z720 Tobacco use: Secondary | ICD-10-CM | POA: Diagnosis present

## 2023-12-07 DIAGNOSIS — E872 Acidosis, unspecified: Secondary | ICD-10-CM | POA: Diagnosis present

## 2023-12-07 DIAGNOSIS — Z801 Family history of malignant neoplasm of trachea, bronchus and lung: Secondary | ICD-10-CM

## 2023-12-07 DIAGNOSIS — Z5901 Sheltered homelessness: Secondary | ICD-10-CM

## 2023-12-07 DIAGNOSIS — Z83438 Family history of other disorder of lipoprotein metabolism and other lipidemia: Secondary | ICD-10-CM

## 2023-12-07 DIAGNOSIS — Z818 Family history of other mental and behavioral disorders: Secondary | ICD-10-CM

## 2023-12-07 DIAGNOSIS — A400 Sepsis due to streptococcus, group A: Principal | ICD-10-CM | POA: Diagnosis present

## 2023-12-07 DIAGNOSIS — F1910 Other psychoactive substance abuse, uncomplicated: Secondary | ICD-10-CM

## 2023-12-07 DIAGNOSIS — B950 Streptococcus, group A, as the cause of diseases classified elsewhere: Secondary | ICD-10-CM

## 2023-12-07 DIAGNOSIS — R636 Underweight: Secondary | ICD-10-CM | POA: Diagnosis present

## 2023-12-07 DIAGNOSIS — A549 Gonococcal infection, unspecified: Secondary | ICD-10-CM | POA: Diagnosis present

## 2023-12-07 DIAGNOSIS — A419 Sepsis, unspecified organism: Principal | ICD-10-CM | POA: Diagnosis present

## 2023-12-07 DIAGNOSIS — F1410 Cocaine abuse, uncomplicated: Secondary | ICD-10-CM | POA: Diagnosis present

## 2023-12-07 DIAGNOSIS — Z8249 Family history of ischemic heart disease and other diseases of the circulatory system: Secondary | ICD-10-CM

## 2023-12-07 DIAGNOSIS — Z681 Body mass index (BMI) 19 or less, adult: Secondary | ICD-10-CM

## 2023-12-07 DIAGNOSIS — R6520 Severe sepsis without septic shock: Principal | ICD-10-CM | POA: Diagnosis present

## 2023-12-07 DIAGNOSIS — F1110 Opioid abuse, uncomplicated: Secondary | ICD-10-CM | POA: Diagnosis present
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
## 2023-12-07 NOTE — ED Notes (Addendum)
 Temp of 103.0 nurse Is aware
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
## 2023-12-08 ENCOUNTER — Emergency Department (HOSPITAL_COMMUNITY)

## 2023-12-08 ENCOUNTER — Inpatient Hospital Stay (HOSPITAL_COMMUNITY)

## 2023-12-08 DIAGNOSIS — B9620 Unspecified Escherichia coli [E. coli] as the cause of diseases classified elsewhere: Secondary | ICD-10-CM | POA: Diagnosis present

## 2023-12-08 DIAGNOSIS — A549 Gonococcal infection, unspecified: Secondary | ICD-10-CM | POA: Diagnosis present

## 2023-12-08 DIAGNOSIS — E872 Acidosis, unspecified: Secondary | ICD-10-CM | POA: Diagnosis present

## 2023-12-08 DIAGNOSIS — B950 Streptococcus, group A, as the cause of diseases classified elsewhere: Secondary | ICD-10-CM | POA: Diagnosis not present

## 2023-12-08 DIAGNOSIS — A400 Sepsis due to streptococcus, group A: Secondary | ICD-10-CM | POA: Diagnosis present

## 2023-12-08 DIAGNOSIS — Z8249 Family history of ischemic heart disease and other diseases of the circulatory system: Secondary | ICD-10-CM | POA: Diagnosis not present

## 2023-12-08 DIAGNOSIS — R636 Underweight: Secondary | ICD-10-CM | POA: Diagnosis present

## 2023-12-08 DIAGNOSIS — Z681 Body mass index (BMI) 19 or less, adult: Secondary | ICD-10-CM | POA: Diagnosis not present

## 2023-12-08 DIAGNOSIS — F1410 Cocaine abuse, uncomplicated: Secondary | ICD-10-CM | POA: Diagnosis present

## 2023-12-08 DIAGNOSIS — I38 Endocarditis, valve unspecified: Secondary | ICD-10-CM | POA: Diagnosis not present

## 2023-12-08 DIAGNOSIS — A419 Sepsis, unspecified organism: Secondary | ICD-10-CM | POA: Diagnosis present

## 2023-12-08 DIAGNOSIS — Z5901 Sheltered homelessness: Secondary | ICD-10-CM | POA: Diagnosis not present

## 2023-12-08 DIAGNOSIS — F1910 Other psychoactive substance abuse, uncomplicated: Secondary | ICD-10-CM | POA: Diagnosis not present

## 2023-12-08 DIAGNOSIS — R6520 Severe sepsis without septic shock: Secondary | ICD-10-CM | POA: Diagnosis present

## 2023-12-08 DIAGNOSIS — F1110 Opioid abuse, uncomplicated: Secondary | ICD-10-CM | POA: Diagnosis present

## 2023-12-08 DIAGNOSIS — N3000 Acute cystitis without hematuria: Secondary | ICD-10-CM | POA: Diagnosis present

## 2023-12-08 DIAGNOSIS — Z1152 Encounter for screening for COVID-19: Secondary | ICD-10-CM | POA: Diagnosis not present

## 2023-12-08 DIAGNOSIS — Z83438 Family history of other disorder of lipoprotein metabolism and other lipidemia: Secondary | ICD-10-CM | POA: Diagnosis not present

## 2023-12-08 DIAGNOSIS — Z801 Family history of malignant neoplasm of trachea, bronchus and lung: Secondary | ICD-10-CM | POA: Diagnosis not present

## 2023-12-08 DIAGNOSIS — R0789 Other chest pain: Secondary | ICD-10-CM | POA: Diagnosis present

## 2023-12-08 DIAGNOSIS — Z818 Family history of other mental and behavioral disorders: Secondary | ICD-10-CM | POA: Diagnosis not present

## 2023-12-08 DIAGNOSIS — R64 Cachexia: Secondary | ICD-10-CM | POA: Diagnosis present

## 2023-12-08 DIAGNOSIS — Z825 Family history of asthma and other chronic lower respiratory diseases: Secondary | ICD-10-CM | POA: Diagnosis not present

## 2023-12-08 DIAGNOSIS — Z720 Tobacco use: Secondary | ICD-10-CM | POA: Diagnosis not present

## 2023-12-08 LAB — PROTIME-INR
INR: 1.2 (ref 0.8–1.2)
Prothrombin Time: 15.7 s — ABNORMAL HIGH (ref 11.4–15.2)

## 2023-12-08 LAB — URINALYSIS, W/ REFLEX TO CULTURE (INFECTION SUSPECTED)
Bilirubin Urine: NEGATIVE
Glucose, UA: NEGATIVE mg/dL
Ketones, ur: NEGATIVE mg/dL
Nitrite: POSITIVE — AB
Protein, ur: 100 mg/dL — AB
Specific Gravity, Urine: 1.024 (ref 1.005–1.030)
WBC, UA: >50 WBC/hpf (ref 0–5)
pH: 5 (ref 5.0–8.0)

## 2023-12-08 LAB — COMPREHENSIVE METABOLIC PANEL WITH GFR
ALT: 13 U/L (ref 0–44)
AST: 18 U/L (ref 15–41)
Albumin: 4 g/dL (ref 3.5–5.0)
Alkaline Phosphatase: 57 U/L (ref 38–126)
Anion gap: 13 (ref 5–15)
BUN: 18 mg/dL (ref 6–20)
CO2: 24 mmol/L (ref 22–32)
Calcium: 9.4 mg/dL (ref 8.9–10.3)
Chloride: 100 mmol/L (ref 98–111)
Creatinine, Ser: 0.72 mg/dL (ref 0.44–1.00)
GFR, Estimated: >60 mL/min (ref >60)
Glucose, Bld: 101 mg/dL — ABNORMAL HIGH (ref 70–99)
Potassium: 3.6 mmol/L (ref 3.5–5.1)
Sodium: 137 mmol/L (ref 135–145)
Total Bilirubin: 0.5 mg/dL (ref 0.0–1.2)
Total Protein: 8.3 g/dL — ABNORMAL HIGH (ref 6.5–8.1)

## 2023-12-08 LAB — RAPID URINE DRUG SCREEN, HOSP PERFORMED
Amphetamines: NOT DETECTED
Barbiturates: NOT DETECTED
Benzodiazepines: POSITIVE — AB
Cocaine: POSITIVE — AB
Opiates: NOT DETECTED
Tetrahydrocannabinol: POSITIVE — AB

## 2023-12-08 LAB — CBC WITH DIFFERENTIAL/PLATELET
Abs Immature Granulocytes: 0.19 10*3/uL — ABNORMAL HIGH (ref 0.00–0.07)
Basophils Absolute: 0.1 10*3/uL (ref 0.0–0.1)
Basophils Relative: 0 %
Eosinophils Absolute: 0 10*3/uL (ref 0.0–0.5)
Eosinophils Relative: 0 %
HCT: 44.1 % (ref 36.0–46.0)
Hemoglobin: 14.3 g/dL (ref 12.0–15.0)
Immature Granulocytes: 1 %
Lymphocytes Relative: 6 %
Lymphs Abs: 1.2 10*3/uL (ref 0.7–4.0)
MCH: 30.4 pg (ref 26.0–34.0)
MCHC: 32.4 g/dL (ref 30.0–36.0)
MCV: 93.8 fL (ref 80.0–100.0)
Monocytes Absolute: 1.5 10*3/uL — ABNORMAL HIGH (ref 0.1–1.0)
Monocytes Relative: 7 %
Neutro Abs: 18.5 10*3/uL — ABNORMAL HIGH (ref 1.7–7.7)
Neutrophils Relative %: 86 %
Platelets: 259 10*3/uL (ref 150–400)
RBC: 4.7 MIL/uL (ref 3.87–5.11)
RDW: 13 % (ref 11.5–15.5)
WBC: 21.4 10*3/uL — ABNORMAL HIGH (ref 4.0–10.5)
nRBC: 0 % (ref 0.0–0.2)

## 2023-12-08 LAB — RESP PANEL BY RT-PCR (RSV, FLU A&B, COVID)  RVPGX2
Influenza A by PCR: NEGATIVE
Influenza B by PCR: NEGATIVE
Resp Syncytial Virus by PCR: NEGATIVE
SARS Coronavirus 2 by RT PCR: NEGATIVE

## 2023-12-08 LAB — ECHOCARDIOGRAM COMPLETE
AR max vel: 2.49 cm2
AV Area VTI: 2.95 cm2
AV Area mean vel: 2.48 cm2
AV Mean grad: 4 mmHg
AV Peak grad: 7.1 mmHg
Ao pk vel: 1.33 m/s
Area-P 1/2: 2.66 cm2
Calc EF: 77.1 %
MV VTI: 3.39 cm2
S' Lateral: 2.6 cm
Single Plane A2C EF: 80.5 %
Single Plane A4C EF: 69.5 %

## 2023-12-08 LAB — I-STAT CG4 LACTIC ACID, ED
Lactic Acid, Venous: 1.7 mmol/L (ref 0.5–1.9)
Lactic Acid, Venous: 2.7 mmol/L (ref 0.5–1.9)

## 2023-12-08 LAB — TROPONIN I (HIGH SENSITIVITY): Troponin I (High Sensitivity): 3 ng/L (ref <18)

## 2023-12-08 LAB — HIV ANTIBODY (ROUTINE TESTING W REFLEX): HIV Screen 4th Generation wRfx: NONREACTIVE

## 2023-12-08 LAB — HCG, SERUM, QUALITATIVE: Preg, Serum: NEGATIVE

## 2023-12-08 LAB — LACTIC ACID, PLASMA: Lactic Acid, Venous: 1.9 mmol/L (ref 0.5–1.9)

## 2023-12-08 LAB — GROUP A STREP BY PCR: Group A Strep by PCR: DETECTED — AB

## 2023-12-08 MED ORDER — ENOXAPARIN SODIUM 40 MG/0.4ML IJ SOSY
40.0000 mg | PREFILLED_SYRINGE | INTRAMUSCULAR | Status: DC
  Administered 2023-12-08 – 2023-12-10 (×3): 40 mg via SUBCUTANEOUS
  Filled 2023-12-08 (×3): qty 0.4

## 2023-12-08 MED ORDER — ONDANSETRON HCL 4 MG/2ML IJ SOLN: 4.0000 mg | Freq: Four times a day (QID) | INTRAMUSCULAR | Status: DC | PRN

## 2023-12-08 MED ORDER — VANCOMYCIN HCL 1250 MG/250ML IV SOLN
1250.0000 mg | INTRAVENOUS | Status: DC
  Administered 2023-12-08 – 2023-12-09 (×2): 1250 mg via INTRAVENOUS
  Filled 2023-12-08 (×2): qty 250

## 2023-12-08 MED ORDER — TRAZODONE HCL 50 MG PO TABS
25.0000 mg | ORAL_TABLET | Freq: Every evening | ORAL | Status: DC | PRN
  Administered 2023-12-08 – 2023-12-09 (×2): 25 mg via ORAL
  Filled 2023-12-08 (×2): qty 1

## 2023-12-08 MED ORDER — LACTATED RINGERS IV SOLN: INTRAVENOUS | Status: AC

## 2023-12-08 MED ORDER — SODIUM CHLORIDE 0.9 % IV SOLN
2.0000 g | INTRAVENOUS | Status: DC
  Administered 2023-12-09 – 2023-12-10 (×2): 2 g via INTRAVENOUS
  Filled 2023-12-08 (×2): qty 20

## 2023-12-08 MED ORDER — SODIUM CHLORIDE (PF) 0.9 % IJ SOLN
INTRAMUSCULAR | Status: AC
  Filled 2023-12-08: qty 50

## 2023-12-08 MED ORDER — ONDANSETRON HCL 4 MG PO TABS: 4.0000 mg | ORAL_TABLET | Freq: Four times a day (QID) | ORAL | Status: DC | PRN

## 2023-12-08 MED ORDER — ACETAMINOPHEN 325 MG PO TABS
650.0000 mg | ORAL_TABLET | Freq: Four times a day (QID) | ORAL | Status: DC | PRN
  Administered 2023-12-08 – 2023-12-09 (×3): 650 mg via ORAL
  Filled 2023-12-08 (×4): qty 2

## 2023-12-08 MED ORDER — KETOROLAC TROMETHAMINE 15 MG/ML IJ SOLN
15.0000 mg | Freq: Once | INTRAMUSCULAR | Status: AC
  Administered 2023-12-08: 15 mg via INTRAVENOUS
  Filled 2023-12-08: qty 1

## 2023-12-08 MED ORDER — NICOTINE 14 MG/24HR TD PT24
14.0000 mg | MEDICATED_PATCH | Freq: Every day | TRANSDERMAL | Status: DC
  Administered 2023-12-08 – 2023-12-10 (×3): 14 mg via TRANSDERMAL
  Filled 2023-12-08 (×3): qty 1

## 2023-12-08 MED ORDER — ACETAMINOPHEN 325 MG PO TABS
650.0000 mg | ORAL_TABLET | Freq: Once | ORAL | Status: AC
  Administered 2023-12-08: 650 mg via ORAL
  Filled 2023-12-08: qty 2

## 2023-12-08 MED ORDER — IOHEXOL 300 MG/ML  SOLN
80.0000 mL | Freq: Once | INTRAMUSCULAR | Status: AC | PRN
  Administered 2023-12-08: 80 mL via INTRAVENOUS

## 2023-12-08 MED ORDER — ALBUTEROL SULFATE (2.5 MG/3ML) 0.083% IN NEBU: 2.5000 mg | INHALATION_SOLUTION | RESPIRATORY_TRACT | Status: DC | PRN

## 2023-12-08 MED ORDER — VANCOMYCIN HCL IN DEXTROSE 1-5 GM/200ML-% IV SOLN
1000.0000 mg | Freq: Once | INTRAVENOUS | Status: AC
  Administered 2023-12-08: 1000 mg via INTRAVENOUS
  Filled 2023-12-08: qty 200

## 2023-12-08 MED ORDER — SODIUM CHLORIDE 0.9 % IV SOLN
2.0000 g | Freq: Once | INTRAVENOUS | Status: AC
  Administered 2023-12-08: 2 g via INTRAVENOUS
  Filled 2023-12-08: qty 20

## 2023-12-08 MED ORDER — LACTATED RINGERS IV BOLUS (SEPSIS)
1000.0000 mL | Freq: Once | INTRAVENOUS | Status: AC
  Administered 2023-12-08: 1000 mL via INTRAVENOUS

## 2023-12-08 MED ORDER — ACETAMINOPHEN 650 MG RE SUPP: 650.0000 mg | Freq: Four times a day (QID) | RECTAL | Status: DC | PRN

## 2023-12-08 MED ORDER — LACTATED RINGERS IV BOLUS
1000.0000 mL | Freq: Once | INTRAVENOUS | Status: AC
  Administered 2023-12-08: 1000 mL via INTRAVENOUS

## 2023-12-08 NOTE — ED Notes (Addendum)
 RN made aware of pt temp 102.4

## 2023-12-08 NOTE — Progress Notes (Signed)
  Echocardiogram 2D Echocardiogram has been performed.  Kayleen Alig L Anntionette Madkins RDCS 12/08/2023, 9:55 AM

## 2023-12-08 NOTE — H&P (Signed)
 History and Physical  Sherrey North ZOX:[id number] DOB: 05/21/1981 DOA: 12/07/2023  PCP: Patient, No Pcp Per   Chief Complaint: Fever  HPI: Morgan Donaldson is a 43 y.o. female with medical history significant for anxiety, polysubstance abuse pain admitted to the hospital with severe sepsis and concern for endocarditis.  Patient states for the last 2 to 3 days she has had a bit of a sore throat, has been having fevers and chills.  Denies chest pain, but having some nausea without vomiting.  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  Review of Systems: Please see HPI for pertinent positives and negatives. A complete 10 system review of systems are otherwise negative.  Past Medical History:  Diagnosis Date   Bronchitis    Hydrosalpinx    bilateral   Palpitations    Physiological ovarian cysts    Past Surgical History:  Procedure Laterality Date   ORIF PELVIC FRACTURE     Social History:  reports that she quit smoking about 4 years ago. Her smoking use included cigarettes. She has never used smokeless tobacco. She reports current drug use. Drug: Marijuana. She reports that she does not drink alcohol.  No Known Allergies  Family History  Problem Relation Age of Onset   Lung cancer Mother        died at age 6   CAD Mother        MI at age 70   Hypertension Mother    Schizophrenia Father    Hypertension Sister    Hyperlipidemia Sister    COPD Sister    Healthy Brother    Healthy Sister      Prior to Admission medications   Medication Sig Start Date End Date Taking? Authorizing Provider  albuterol (VENTOLIN HFA) 108 (90 Base) MCG/ACT inhaler Inhale 2 puffs into the lungs every 6 (six) hours as needed for wheezing or shortness of breath. Patient not taking: Reported on 12/08/2023 08/15/23   Rising, Ivette Marks, PA-C  famotidine (PEPCID) 20 MG tablet Take 1 tablet (20 mg total) by mouth 2 (two) times daily. Patient not taking: Reported on 12/08/2023 09/21/23   Kommor, Alyse July, MD   ibuprofen (ADVIL) 800 MG tablet Take 1 tablet (800 mg total) by mouth 3 (three) times daily. Patient not taking: Reported on 12/08/2023 08/15/23   Rising, Ivette Marks, PA-C  methocarbamol (ROBAXIN) 750 MG tablet Take 1 tablet (750 mg total) by mouth 3 (three) times daily as needed (muscle spasm/pain). 11/29/23   Guadalupe Lee, MD    Physical Exam: BP 130/78   Pulse 95   Temp (!) 102.4 F (39.1 C) (Oral)   Resp (!) 25   SpO2 98%  General:  Alert, oriented, calm, in no acute distress, tired but nontoxic Oropharynx: Erythema without exudate Cardiovascular: RRR, no murmurs or rubs, no peripheral edema  Respiratory: clear to auscultation bilaterally, no wheezes, no crackles  Abdomen: soft, nontender, nondistended, normal bowel tones heard  Skin: dry, no rashes  Musculoskeletal: no joint effusions, normal range of motion  Psychiatric: appropriate affect, normal speech  Neurologic: extraocular muscles intact, clear speech, moving all extremities with intact sensorium         Labs on Admission:  Basic Metabolic Panel: Recent Labs  Lab 12/08/23 0022  NA 137  K 3.6  CL 100  CO2 24  GLUCOSE 101*  BUN 18  CREATININE 0.72  CALCIUM 9.4   Liver Function Tests: Recent Labs  Lab 12/08/23 0022  AST 18  ALT 13  ALKPHOS 57  BILITOT  0.5  PROT 8.3*  ALBUMIN 4.0   No results for input(s): "LIPASE", "AMYLASE" in the last 168 hours. No results for input(s): "AMMONIA" in the last 168 hours. CBC: Recent Labs  Lab 12/08/23 0022  WBC 21.4*  NEUTROABS 18.5*  HGB 14.3  HCT 44.1  MCV 93.8  PLT 259   Cardiac Enzymes: No results for input(s): "CKTOTAL", "CKMB", "CKMBINDEX", "TROPONINI" in the last 168 hours. BNP (last 3 results) No results for input(s): "BNP" in the last 8760 hours.  ProBNP (last 3 results) No results for input(s): "PROBNP" in the last 8760 hours.  CBG: No results for input(s): "GLUCAP" in the last 168 hours.  Radiological Exams on Admission: CT ABDOMEN PELVIS W  CONTRAST Result Date: 12/08/2023 CLINICAL DATA:  Abdominal pain EXAM: CT ABDOMEN AND PELVIS WITH CONTRAST TECHNIQUE: Multidetector CT imaging of the abdomen and pelvis was performed using the standard protocol following bolus administration of intravenous contrast. RADIATION DOSE REDUCTION: This exam was performed according to the departmental dose-optimization program which includes automated exposure control, adjustment of the mA and/or kV according to patient size and/or use of iterative reconstruction technique. CONTRAST:  80mL OMNIPAQUE IOHEXOL 300 MG/ML  SOLN COMPARISON:  07/13/2019 FINDINGS: Lower chest: No acute abnormality Hepatobiliary: No focal hepatic abnormality. Gallbladder unremarkable. Pancreas: No focal abnormality or ductal dilatation. Spleen: No focal abnormality.  Normal size. Adrenals/Urinary Tract: No adrenal abnormality. No focal renal abnormality. No stones or hydronephrosis. Urinary bladder is unremarkable. Stomach/Bowel: Bowel evaluation limited due to lack of oral contrast and little intra-abdominal fat. No bowel dilatation. No evidence of obstruction or inflammation. Vascular/Lymphatic: No evidence of aneurysm or adenopathy. Reproductive: Uterus and adnexa unremarkable.  No mass. Other: No free fluid or free air. Musculoskeletal: Bilateral screws across the SI joints. Plate and screw fixation across the pubic symphysis. No acute bony abnormality. IMPRESSION: No acute findings in the abdomen or pelvis. Electronically Signed   By: Janeece Mechanic M.D.   On: 12/08/2023 02:45   DG Chest Port 1 View Result Date: 12/08/2023 CLINICAL DATA:  Chest pain, questionable sepsis. EXAM: PORTABLE CHEST 1 VIEW COMPARISON:  04/29/2019 FINDINGS: The heart size and mediastinal contours are within normal limits. Both lungs are clear. The visualized skeletal structures are unremarkable. IMPRESSION: No active disease. Electronically Signed   By: Janeece Mechanic M.D.   On: 12/08/2023 01:04    Assessment/Plan Morgan Donaldson is a 43 y.o. female with medical history significant for anxiety, polysubstance abuse pain admitted to the hospital with severe sepsis and concern for endocarditis.   Severe sepsis-meeting criteria with tachycardia, leukocytosis, fever, lactate 2.7.  Source is unclear at this time, but given her IV drug use there is concern for endocarditis.  No murmur on exam. -Inpatient admission -Monitor on telemetry -Continue empiric IV ceftriaxone and IV vancomycin -Follow-up peripheral blood cultures -Follow-up 2D echo -May need ID consult, TEE, etc. pending the above  Polysubstance abuse-TOC consult  DVT prophylaxis: Lovenox     Code Status: Full Code  Consults called: None  Admission status: The appropriate patient status for this patient is INPATIENT. Inpatient status is judged to be reasonable and necessary in order to provide the required intensity of service to ensure the patient's safety. The patient's presenting symptoms, physical exam findings, and initial radiographic and laboratory data in the context of their chronic comorbidities is felt to place them at high risk for further clinical deterioration. Furthermore, it is not anticipated that the patient will be medically stable for discharge from the hospital within 2  midnights of admission.    I certify that at the point of admission it is my clinical judgment that the patient will require inpatient hospital care spanning beyond 2 midnights from the point of admission due to high intensity of service, high risk for further deterioration and high frequency of surveillance required  Time spent: 59 minutes  Lissete Maestas Rickey Charm MD [facility] Pager [phone]  If 7PM-7AM, please contact night-coverage [email or web address] Password [facility]  12/08/2023, 7:41 AM

## 2023-12-08 NOTE — Progress Notes (Signed)
 Pharmacy Antibiotic Note  Morgan Donaldson is a 43 y.o. female admitted on 12/07/2023 with history of IVDU who presented with chest pain, fever, & SOB. Febrile to 103 FOn intake, tachycardic. Vital signs otherwise normal. Cardiopulmonary exam concerning for new murmur and question of S3 gallop. Admitted with concern for endocarditis in context of IVDU with new murmur and fever.  Pharmacy has been consulted for IV vancomycin dosing for bacteremia.  4/12 0137 Administered vancomycin 1 g IV x 1  Plan: Continue vancomycin 1250 mg IV every 24 hours (Goal AUC 400-550, eAUC 517.8, SCr used: rounded up to 0.8) Continue Rocephin 2 g IV q24h per provider Monitor clinical progress, renal function, vancomycin levels as indicated F/U C&S, abx deescalation / LOT      Temp (24hrs), Avg:101.7 F (38.7 C), Min:99.8 F (37.7 C), Max:103 F (39.4 C)  Recent Labs  Lab 12/08/23 0022 12/08/23 0030 12/08/23 0256 12/08/23 0432  WBC 21.4*  --   --   --   CREATININE 0.72  --   --   --   LATICACIDVEN  --  1.7 2.7* 1.9    Estimated Creatinine Clearance: 74.2 mL/min (by C-G formula based on SCr of 0.72 mg/dL).    No Known Allergies  Antimicrobials this admission: 4/12 Rocephin >>  4/12 vancomycin >>   Microbiology results: 4/12 BCx: pending 4/12 resp panel: neg 4/12 Group A Strep: detected   Thank you for allowing pharmacy to be a part of this patient's care.  Alfredo Inch, PharmD, BCPS Clinical Pharmacist [facility] 12/08/2023 7:54 AM

## 2023-12-08 NOTE — Sepsis Progress Note (Signed)
 Following for sepsis monitoring ?

## 2023-12-09 ENCOUNTER — Encounter (HOSPITAL_COMMUNITY): Payer: Self-pay | Admitting: Internal Medicine

## 2023-12-09 DIAGNOSIS — A419 Sepsis, unspecified organism: Secondary | ICD-10-CM | POA: Diagnosis not present

## 2023-12-09 DIAGNOSIS — F1910 Other psychoactive substance abuse, uncomplicated: Secondary | ICD-10-CM | POA: Diagnosis not present

## 2023-12-09 DIAGNOSIS — N3000 Acute cystitis without hematuria: Secondary | ICD-10-CM | POA: Diagnosis not present

## 2023-12-09 DIAGNOSIS — B950 Streptococcus, group A, as the cause of diseases classified elsewhere: Secondary | ICD-10-CM | POA: Diagnosis not present

## 2023-12-09 DIAGNOSIS — R6520 Severe sepsis without septic shock: Secondary | ICD-10-CM

## 2023-12-09 LAB — CBC
HCT: 40.6 % (ref 36.0–46.0)
Hemoglobin: 13 g/dL (ref 12.0–15.0)
MCH: 30.2 pg (ref 26.0–34.0)
MCHC: 32 g/dL (ref 30.0–36.0)
MCV: 94.2 fL (ref 80.0–100.0)
Platelets: 263 10*3/uL (ref 150–400)
RBC: 4.31 MIL/uL (ref 3.87–5.11)
RDW: 13.2 % (ref 11.5–15.5)
WBC: 10.7 10*3/uL — ABNORMAL HIGH (ref 4.0–10.5)
nRBC: 0 % (ref 0.0–0.2)

## 2023-12-09 LAB — BASIC METABOLIC PANEL WITH GFR
Anion gap: 5 (ref 5–15)
BUN: 15 mg/dL (ref 6–20)
CO2: 25 mmol/L (ref 22–32)
Calcium: 8.7 mg/dL — ABNORMAL LOW (ref 8.9–10.3)
Chloride: 108 mmol/L (ref 98–111)
Creatinine, Ser: 0.55 mg/dL (ref 0.44–1.00)
GFR, Estimated: >60 mL/min (ref >60)
Glucose, Bld: 98 mg/dL (ref 70–99)
Potassium: 3.6 mmol/L (ref 3.5–5.1)
Sodium: 138 mmol/L (ref 135–145)

## 2023-12-09 MED ORDER — HYDROCORTISONE 1 % EX CREA
1.0000 | TOPICAL_CREAM | Freq: Three times a day (TID) | CUTANEOUS | Status: DC | PRN
  Administered 2023-12-09: 1 via TOPICAL
  Filled 2023-12-09: qty 28

## 2023-12-09 MED ORDER — PENICILLIN G BENZATHINE 1200000 UNIT/2ML IM SUSY
1.2000 10*6.[IU] | PREFILLED_SYRINGE | Freq: Once | INTRAMUSCULAR | Status: AC
  Administered 2023-12-09: 1.2 10*6.[IU] via INTRAMUSCULAR
  Filled 2023-12-09: qty 2

## 2023-12-09 NOTE — Assessment & Plan Note (Signed)
-   Patient presented with fever, left neck pain, sore throat -Group A strep swab positive on admission -Treating with IM penicillin dose for definitive treatment

## 2023-12-09 NOTE — Assessment & Plan Note (Signed)
-   Okay for nicotine patch if needed

## 2023-12-09 NOTE — Assessment & Plan Note (Signed)
-   Recent injection into right AC; no obvious local signs of infection -Patient admitted for empiric treatment and monitoring of blood cultures in setting of concern for more complex infection such as endocarditis -Continue on vancomycin and Rocephin for now - Follow-up blood cultures -Echo obtained and negative for signs of vegetation; no indication for TEE unless blood cultures become positive

## 2023-12-09 NOTE — Assessment & Plan Note (Addendum)
-   She is endorsing urinary symptoms on admission - UA noted with positive nitrite, large LE, greater than 50 WBC, many bacteria - Follow-up urine culture; growing E. coli, sensitivities reviewed -Treated with 3 days of Rocephin during hospitalization; continued on 2 more days of cefadroxil at discharge

## 2023-12-09 NOTE — Progress Notes (Signed)
   12/09/23 1244  TOC Brief Assessment  Insurance and Status Reviewed  Patient has primary care physician No  Home environment has been reviewed shelter  Prior level of function: independent  Prior/Current Home Services No current home services  Social Drivers of Health Review SDOH reviewed no interventions necessary  Readmission risk has been reviewed Yes  Transition of care needs transition of care needs identified, TOC will continue to follow

## 2023-12-09 NOTE — Hospital Course (Signed)
 Morgan Donaldson is a 43 yo female with PMH IVDA who presented with fever, sore throat, left neck tenderness.  She had endorsed recently injecting into her right arm heroin.  She also endorsed some urinary symptoms on admission. She was admitted for further infectious workup.  Urinalysis was consistent with signs of infection. UDS positive for cocaine, benzos, THC. Group A strep swab positive. Blood cultures were also obtained.  She was admitted to be started on antibiotics and ongoing monitoring.

## 2023-12-09 NOTE — Assessment & Plan Note (Signed)
-   Febrile, tachycardia, tachypnea, leukocytosis, lactic acidosis - Multiple sources including group A strep infection, UTI -See separate problems

## 2023-12-09 NOTE — Progress Notes (Signed)
 Progress Note    Morgan Donaldson   ZOX:[id number]  DOB: 1980/12/03  DOA: 12/07/2023     1 PCP: Patient, No Pcp Per  Initial CC: Fever, sore throat  Hospital Course: Morgan Donaldson is a 43 yo female with PMH IVDA who presented with fever, sore throat, left neck tenderness.  She had endorsed recently injecting into her right arm heroin.  She also endorsed some urinary symptoms on admission. She was admitted for further infectious workup.  Urinalysis was consistent with signs of infection. UDS positive for cocaine, benzos, THC. Group A strep swab positive. Blood cultures were also obtained.  She was admitted to be started on antibiotics and ongoing monitoring.  Interval History:  Resting comfortably in bed with family member at bedside.  Still noting pain in her left neck which is a swollen lymph node.  We reviewed plan for treating strep, UTI, and watching blood cultures while on empiric antibiotics.  Assessment and Plan: * Severe sepsis (HCC) - Febrile, tachycardia, tachypnea, leukocytosis, lactic acidosis - Multiple sources including group A strep infection, UTI -See separate problems  Group A streptococcal infection - Patient presented with fever, left neck pain, sore throat -Group A strep swab positive on admission -Treating with IM penicillin dose for definitive treatment  IV drug abuse (HCC) - Recent injection into right AC; no obvious local signs of infection -Patient admitted for empiric treatment and monitoring of blood cultures in setting of concern for more complex infection such as endocarditis -Continue on vancomycin and Rocephin for now - Follow-up blood cultures -Echo obtained and negative for signs of vegetation; no indication for TEE unless blood cultures become positive  Acute cystitis - She is endorsing urinary symptoms on admission - UA noted with positive nitrite, large LE, greater than 50 WBC, many bacteria - Follow-up urine culture; growing E. coli, follow-up  for sensitivities - Continue Rocephin - Also follow-up GC/chlamydia  Tobacco abuse - Okay for nicotine patch if needed   Old records reviewed in assessment of this patient  Antimicrobials: Bicillin LA 4/13 x 1 Rocephin 12/08/2023 >> current Vancomycin 12/08/2023 >> current  DVT prophylaxis:  enoxaparin (LOVENOX) injection 40 mg Start: 12/08/23 1000   Code Status:   Code Status: Full Code  Mobility Assessment (Last 72 Hours)     Mobility Assessment     Row Name 12/09/23 1132 12/09/23 0800 12/08/23 1955 12/08/23 0900     Does patient have an order for bedrest or is patient medically unstable No - Continue assessment No - Continue assessment No - Continue assessment No - Continue assessment    What is the highest level of mobility based on the progressive mobility assessment? Level 6 (Walks independently in room and hall) - Balance while walking in room without assist - Complete Level 6 (Walks independently in room and hall) - Balance while walking in room without assist - Complete Level 6 (Walks independently in room and hall) - Balance while walking in room without assist - Complete Level 6 (Walks independently in room and hall) - Balance while walking in room without assist - Complete             Barriers to discharge: None Disposition Plan: Home HH orders placed: N/A Status is: Inpatient  Objective: Blood pressure 136/77, pulse 77, temperature 98 F (36.7 C), resp. rate 20, SpO2 100%.  Examination:  Physical Exam Constitutional:      Appearance: Normal appearance.     Comments: Mildly disheveled  HENT:     Head:  Normocephalic and atraumatic.     Mouth/Throat:     Mouth: Mucous membranes are moist.  Eyes:     Extraocular Movements: Extraocular movements intact.  Cardiovascular:     Rate and Rhythm: Normal rate and regular rhythm.  Pulmonary:     Effort: Pulmonary effort is normal. No respiratory distress.     Breath sounds: Normal breath sounds. No wheezing.   Abdominal:     General: Bowel sounds are normal. There is no distension.     Palpations: Abdomen is soft.     Tenderness: There is no abdominal tenderness.  Musculoskeletal:        General: Normal range of motion.     Cervical back: Normal range of motion and neck supple.     Comments: Track marks noted in right [facility] with palpable superficial vein, no erythema, tenderness, drainage  Lymphadenopathy:     Cervical: Cervical adenopathy present.     Right cervical: Superficial cervical adenopathy present.     Left cervical: Superficial cervical adenopathy present.  Skin:    General: Skin is warm and dry.  Neurological:     General: No focal deficit present.     Mental Status: She is alert.  Psychiatric:        Mood and Affect: Mood normal.      Consultants:    Procedures:    Data Reviewed: Results for orders placed or performed during the hospital encounter of 12/07/23 (from the past 24 hours)  Basic metabolic panel     Status: Abnormal   Collection Time: 12/09/23  5:44 AM  Result Value Ref Range   Sodium 138 135 - 145 mmol/L   Potassium 3.6 3.5 - 5.1 mmol/L   Chloride 108 98 - 111 mmol/L   CO2 25 22 - 32 mmol/L   Glucose, Bld 98 70 - 99 mg/dL   BUN 15 6 - 20 mg/dL   Creatinine, Ser 0.10 0.44 - 1.00 mg/dL   Calcium 8.7 (L) 8.9 - 10.3 mg/dL   GFR, Estimated >27 >25 mL/min   Anion gap 5 5 - 15  CBC     Status: Abnormal   Collection Time: 12/09/23  5:44 AM  Result Value Ref Range   WBC 10.7 (H) 4.0 - 10.5 K/uL   RBC 4.31 3.87 - 5.11 MIL/uL   Hemoglobin 13.0 12.0 - 15.0 g/dL   HCT 36.6 44.0 - 34.7 %   MCV 94.2 80.0 - 100.0 fL   MCH 30.2 26.0 - 34.0 pg   MCHC 32.0 30.0 - 36.0 g/dL   RDW 42.5 95.6 - 38.7 %   Platelets 263 150 - 400 K/uL   nRBC 0.0 0.0 - 0.2 %    I have reviewed pertinent nursing notes, vitals, labs, and images as necessary. I have ordered labwork to follow up on as indicated.  I have reviewed the last notes from staff over past 24 hours. I have  discussed patient's care plan and test results with nursing staff, CM/SW, and other staff as appropriate.  Time spent: Greater than 50% of the 55 minute visit was spent in counseling/coordination of care for the patient as laid out in the A&P.   LOS: 1 day   Faith Homes, MD [facility] 12/09/2023, 2:10 PM

## 2023-12-10 ENCOUNTER — Other Ambulatory Visit (HOSPITAL_COMMUNITY): Payer: Self-pay

## 2023-12-10 DIAGNOSIS — B950 Streptococcus, group A, as the cause of diseases classified elsewhere: Secondary | ICD-10-CM | POA: Diagnosis not present

## 2023-12-10 DIAGNOSIS — F1910 Other psychoactive substance abuse, uncomplicated: Secondary | ICD-10-CM | POA: Diagnosis not present

## 2023-12-10 DIAGNOSIS — A419 Sepsis, unspecified organism: Secondary | ICD-10-CM | POA: Diagnosis not present

## 2023-12-10 DIAGNOSIS — A549 Gonococcal infection, unspecified: Secondary | ICD-10-CM

## 2023-12-10 LAB — CBC WITH DIFFERENTIAL/PLATELET
Abs Immature Granulocytes: 0.06 10*3/uL (ref 0.00–0.07)
Basophils Absolute: 0.1 10*3/uL (ref 0.0–0.1)
Basophils Relative: 2 %
Eosinophils Absolute: 0.5 10*3/uL (ref 0.0–0.5)
Eosinophils Relative: 7 %
HCT: 44.4 % (ref 36.0–46.0)
Hemoglobin: 13.8 g/dL (ref 12.0–15.0)
Immature Granulocytes: 1 %
Lymphocytes Relative: 32 %
Lymphs Abs: 2.5 10*3/uL (ref 0.7–4.0)
MCH: 29.6 pg (ref 26.0–34.0)
MCHC: 31.1 g/dL (ref 30.0–36.0)
MCV: 95.3 fL (ref 80.0–100.0)
Monocytes Absolute: 0.9 10*3/uL (ref 0.1–1.0)
Monocytes Relative: 12 %
Neutro Abs: 3.7 10*3/uL (ref 1.7–7.7)
Neutrophils Relative %: 46 %
Platelets: 333 10*3/uL (ref 150–400)
RBC: 4.66 MIL/uL (ref 3.87–5.11)
RDW: 13.2 % (ref 11.5–15.5)
Smear Review: NORMAL
WBC: 7.9 10*3/uL (ref 4.0–10.5)
nRBC: 0 % (ref 0.0–0.2)

## 2023-12-10 LAB — GC/CHLAMYDIA PROBE AMP (~~LOC~~) NOT AT ARMC
Chlamydia: NEGATIVE
Comment: NEGATIVE
Comment: NORMAL
Neisseria Gonorrhea: POSITIVE — AB

## 2023-12-10 LAB — URINE CULTURE: Culture: >=100000 — AB

## 2023-12-10 MED ORDER — CEFADROXIL 500 MG PO CAPS
1000.0000 mg | ORAL_CAPSULE | Freq: Two times a day (BID) | ORAL | 0 refills | Status: AC
  Filled 2023-12-10: qty 8, 2d supply, fill #0

## 2023-12-10 NOTE — TOC Transition Note (Signed)
 Transition of Care [facility]) - Discharge Note   Patient Details  Name: Morgan Donaldson MRN: [id number] Date of Birth: 09/11/1980  Transition [facility] CM/SW Contact:  Ruben Corolla, RN Phone Number: 12/10/2023, 10:05 AM   Clinical Narrative:Spoke to patient about d/c plans-she has [employer]/managed medicaid-has PCP,pharmacy,transportation. Provided patient w/medicaid ID#, & tel#. Has a home to d/c to will stay w/her family, &  own transport home. No further CM needs.      Final next level of care: Home/Self Care Barriers to Discharge: No Barriers Identified   Patient Goals and CMS Choice            Discharge Placement                       Discharge Plan and Services Additional resources added to the After Visit Summary for                                       Social Drivers of Health (SDOH) Interventions SDOH Screenings   Food Insecurity: Food Insecurity Present (12/08/2023)  Housing: High Risk (12/08/2023)  Transportation Needs: Unmet Transportation Needs (12/08/2023)  Utilities: Not At Risk (12/08/2023)  Depression (PHQ2-9): Low Risk  (11/01/2020)  Tobacco Use: Medium Risk (12/09/2023)     Readmission Risk Interventions    12/09/2023   12:44 PM  Readmission Risk Prevention Plan  Post Dischage Appt Complete  Medication Screening Complete  Transportation Screening Complete

## 2023-12-10 NOTE — Discharge Summary (Signed)
 Physician Discharge Summary   Lenae Wherley ZOX:[id number] DOB: [birth year] DOA: 12/07/2023  PCP: Patient, No Pcp Per  Admit date: 12/07/2023 Discharge date: 12/10/2023  Admitted From: Home Disposition: Home Discharging physician: Lewie Chamber, MD Barriers to discharge: None   Discharge Condition: stable CODE STATUS: Full Diet recommendation:  Diet Orders (From admission, onward)     Start     Ordered   12/10/23 0000  Diet general        12/10/23 0925   12/08/23 0740  Diet regular Room service appropriate? Yes; Fluid consistency: Thin  Diet effective now       Question Answer Comment  Room service appropriate? Yes   Fluid consistency: Thin      12/08/23 0739            Hospital Course: Ms. Agar is a 43 yo female with PMH IVDA who presented with fever, sore throat, left neck tenderness.  She had endorsed recently injecting into her right arm heroin.  She also endorsed some urinary symptoms on admission. She was admitted for further infectious workup.  Urinalysis was consistent with signs of infection. UDS positive for cocaine, benzos, THC. Group A strep swab positive. Blood cultures were also obtained.  She was admitted to be started on antibiotics and ongoing monitoring.  Assessment and Plan: * Severe sepsis (HCC)-resolved as of 12/10/2023 - Febrile, tachycardia, tachypnea, leukocytosis, lactic acidosis - Multiple sources including group A strep infection, UTI -See separate problems  Group A streptococcal infection - Patient presented with fever, left neck pain, sore throat -Group A strep swab positive on admission -Treated with IM penicillin dose for definitive treatment  Gonorrhea - Gonorrhea testing positive after discharge -Tried calling patient on phone numbers listed in epic but incorrect numbers are entered and no answers under emergency contact -Regardless, she has been sufficiently treated with Rocephin during hospitalization  IV drug abuse (HCC) -  Recent injection into right AC; no obvious local signs of infection -Patient admitted for empiric treatment and monitoring of blood cultures in setting of concern for more complex infection such as endocarditis - Treated with vancomycin and Rocephin during hospitalization - Blood cultures negative at 48 hours -Echo obtained and negative for signs of vegetation; no indication for TEE unless blood cultures become positive  Acute cystitis - She is endorsing urinary symptoms on admission - UA noted with positive nitrite, large LE, greater than 50 WBC, many bacteria - Follow-up urine culture; growing E. coli, sensitivities reviewed -Treated with 3 days of Rocephin during hospitalization; continued on 2 more days of cefadroxil at discharge  Tobacco abuse - Okay for nicotine patch if needed   The patient's acute and chronic medical conditions were treated accordingly. On day of discharge, patient was felt deemed stable for discharge. Patient/family member advised to call PCP or come back to ER if needed.   Principal Diagnosis: Severe sepsis [facility])  Discharge Diagnoses: Active Hospital Problems   Diagnosis Date Noted   Group A streptococcal infection 12/09/2023    Priority: 2.   Gonorrhea 12/10/2023    Priority: 3.   IV drug abuse (HCC) 12/09/2023    Priority: 3.   Acute cystitis 12/09/2023    Priority: 4.   Tobacco abuse 05/19/2019    Resolved Hospital Problems   Diagnosis Date Noted Date Resolved   Severe sepsis [facility]) 12/08/2023 12/10/2023    Priority: 1.     Discharge Instructions     Diet general   Complete by: As directed    Increase  activity slowly   Complete by: As directed       Allergies as of 12/10/2023   No Known Allergies      Medication List     STOP taking these medications    albuterol 108 (90 Base) MCG/ACT inhaler Commonly known as: VENTOLIN HFA   famotidine 20 MG tablet Commonly known as: PEPCID   ibuprofen 800 MG tablet Commonly known as:  ADVIL       TAKE these medications    cefadroxil 500 MG capsule Commonly known as: DURICEF Take 2 capsules (1,000 mg total) by mouth 2 (two) times daily for 2 days. Start taking on: December 11, 2023   methocarbamol 750 MG tablet Commonly known as: ROBAXIN Take 1 tablet (750 mg total) by mouth 3 (three) times daily as needed (muscle spasm/pain).        No Known Allergies  Consultations:   Procedures:   Discharge Exam: BP 121/62 (BP Location: Left Arm)   Pulse 63   Temp 98.9 F (37.2 C) (Oral)   Resp 20   Ht 5\' 7"  (1.702 m)   Wt 56 kg   SpO2 99%   BMI 19.34 kg/m  Physical Exam Constitutional:      Appearance: Normal appearance.     Comments: Mildly disheveled  HENT:     Head: Normocephalic and atraumatic.     Mouth/Throat:     Mouth: Mucous membranes are moist.  Eyes:     Extraocular Movements: Extraocular movements intact.  Cardiovascular:     Rate and Rhythm: Normal rate and regular rhythm.  Pulmonary:     Effort: Pulmonary effort is normal. No respiratory distress.     Breath sounds: Normal breath sounds. No wheezing.  Abdominal:     General: Bowel sounds are normal. There is no distension.     Palpations: Abdomen is soft.     Tenderness: There is no abdominal tenderness.  Musculoskeletal:        General: Normal range of motion.     Cervical back: Normal range of motion and neck supple.     Comments: Track marks noted in right [facility] with palpable superficial vein, no erythema, tenderness, drainage  Lymphadenopathy:     Cervical: Cervical adenopathy present.     Right cervical: Superficial cervical adenopathy present.     Left cervical: Superficial cervical adenopathy present.  Skin:    General: Skin is warm and dry.  Neurological:     General: No focal deficit present.     Mental Status: She is alert.  Psychiatric:        Mood and Affect: Mood normal.      The results of significant diagnostics from this hospitalization (including imaging,  microbiology, ancillary and laboratory) are listed below for reference.   Microbiology: Recent Results (from the past 240 hours)  Resp panel by RT-PCR (RSV, Flu A&B, Covid) Anterior Nasal Swab     Status: None   Collection Time: 12/08/23 12:11 AM   Specimen: Anterior Nasal Swab  Result Value Ref Range Status   SARS Coronavirus 2 by RT PCR NEGATIVE NEGATIVE Final    Comment: (NOTE) SARS-CoV-2 target nucleic acids are NOT DETECTED.  The SARS-CoV-2 RNA is generally detectable in upper respiratory specimens during the acute phase of infection. The lowest concentration of SARS-CoV-2 viral copies this assay can detect is 138 copies/mL. A negative result does not preclude SARS-Cov-2 infection and should not be used as the sole basis for treatment or other patient management decisions. A  negative result may occur with  improper specimen collection/handling, submission of specimen other than nasopharyngeal swab, presence of viral mutation(s) within the areas targeted by this assay, and inadequate number of viral copies(<138 copies/mL). A negative result must be combined with clinical observations, patient history, and epidemiological information. The expected result is Negative.  Fact Sheet for Patients:  BloggerCourse.com  Fact Sheet for Healthcare Providers:  SeriousBroker.it  This test is no t yet approved or cleared by the [place] FDA and  has been authorized for detection and/or diagnosis of SARS-CoV-2 by FDA under an Emergency Use Authorization (EUA). This EUA will remain  in effect (meaning this test can be used) for the duration of the COVID-19 declaration under Section 564(b)(1) of the Act, 21 U.S.C.section 360bbb-3(b)(1), unless the authorization is terminated  or revoked sooner.       Influenza A by PCR NEGATIVE NEGATIVE Final   Influenza B by PCR NEGATIVE NEGATIVE Final    Comment: (NOTE) The Xpert Xpress  SARS-CoV-2/FLU/RSV plus assay is intended as an aid in the diagnosis of influenza from Nasopharyngeal swab specimens and should not be used as a sole basis for treatment. Nasal washings and aspirates are unacceptable for Xpert Xpress SARS-CoV-2/FLU/RSV testing.  Fact Sheet for Patients: BloggerCourse.com  Fact Sheet for Healthcare Providers: SeriousBroker.it  This test is not yet approved or cleared by the [place] FDA and has been authorized for detection and/or diagnosis of SARS-CoV-2 by FDA under an Emergency Use Authorization (EUA). This EUA will remain in effect (meaning this test can be used) for the duration of the COVID-19 declaration under Section 564(b)(1) of the Act, 21 U.S.C. section 360bbb-3(b)(1), unless the authorization is terminated or revoked.     Resp Syncytial Virus by PCR NEGATIVE NEGATIVE Final    Comment: (NOTE) Fact Sheet for Patients: BloggerCourse.com  Fact Sheet for Healthcare Providers: SeriousBroker.it  This test is not yet approved or cleared by the [place] FDA and has been authorized for detection and/or diagnosis of SARS-CoV-2 by FDA under an Emergency Use Authorization (EUA). This EUA will remain in effect (meaning this test can be used) for the duration of the COVID-19 declaration under Section 564(b)(1) of the Act, 21 U.S.C. section 360bbb-3(b)(1), unless the authorization is terminated or revoked.  Performed at [facility], [address] [place], [place] [address]   Urine Culture     Status: Abnormal   Collection Time: 12/08/23 12:11 AM   Specimen: Urine, Random  Result Value Ref Range Status   Specimen Description   Final    URINE, RANDOM Performed at [facility], [address] [place], [place] [address]    Special Requests   Final    NONE Reflexed from [phone] Performed at [facility], [address], [place], [place] [address]    Culture >=100,000 COLONIES/mL ESCHERICHIA COLI (A)  Final   Report Status 12/10/2023 FINAL  Final   Organism ID, Bacteria ESCHERICHIA COLI (A)  Final      Susceptibility   Escherichia coli - MIC*    AMPICILLIN >=32 RESISTANT Resistant     CEFAZOLIN 16 SENSITIVE Sensitive     CEFEPIME <=0.12 SENSITIVE Sensitive     CEFTRIAXONE <=0.25 SENSITIVE Sensitive     CIPROFLOXACIN <=0.25 SENSITIVE Sensitive     GENTAMICIN <=1 SENSITIVE Sensitive     IMIPENEM <=0.25 SENSITIVE Sensitive     NITROFURANTOIN <=16 SENSITIVE Sensitive     TRIMETH/SULFA <=20 SENSITIVE Sensitive  AMPICILLIN/SULBACTAM >=32 RESISTANT Resistant     PIP/TAZO <=4 SENSITIVE Sensitive ug/mL    * >=100,000 COLONIES/mL ESCHERICHIA COLI  Blood Culture (routine x 2)     Status: None (Preliminary result)   Collection Time: 12/08/23 12:18 AM   Specimen: BLOOD  Result Value Ref Range Status   Specimen Description   Final    BLOOD RIGHT ANTECUBITAL Performed at [facility], [address] [place], [place] [address]    Special Requests   Final    BOTTLES DRAWN AEROBIC AND ANAEROBIC Blood Culture results may not be optimal due to an inadequate volume of blood received in culture bottles Performed at [facility], [address], [place], [place] [address]    Culture   Final    NO GROWTH 2 DAYS Performed at [facility], [address] [place], [place] [address]    Report Status PENDING  Incomplete  Blood Culture (routine x 2)     Status: None (Preliminary result)   Collection Time: 12/08/23 12:43 AM   Specimen: BLOOD LEFT FOREARM  Result Value Ref Range Status   Specimen Description   Final    BLOOD LEFT FOREARM Performed at [facility], [address] [place], [place] [address]    Special Requests   Final    BOTTLES DRAWN AEROBIC AND ANAEROBIC Blood Culture results may not be optimal due to an  inadequate volume of blood received in culture bottles Performed at [facility], [address], [place], [place] [address]    Culture   Final    NO GROWTH 2 DAYS Performed at [facility], [address], [place], [place] [address]    Report Status PENDING  Incomplete  Group A Strep by PCR     Status: Abnormal   Collection Time: 12/08/23 12:54 AM   Specimen: Throat; Sterile Swab  Result Value Ref Range Status   Group A Strep by PCR DETECTED (A) NOT DETECTED Final    Comment: Performed at [facility], [address] [place], [place] [address]     Labs: BNP (last 3 results) No results for input(s): "BNP" in the last 8760 hours. Basic Metabolic Panel: Recent Labs  Lab 12/08/23 0022 12/09/23 0544  NA 137 138  K 3.6 3.6  CL 100 108  CO2 24 25  GLUCOSE 101* 98  BUN 18 15  CREATININE 0.72 0.55  CALCIUM 9.4 8.7*   Liver Function Tests: Recent Labs  Lab 12/08/23 0022  AST 18  ALT 13  ALKPHOS 57  BILITOT 0.5  PROT 8.3*  ALBUMIN 4.0   No results for input(s): "LIPASE", "AMYLASE" in the last 168 hours. No results for input(s): "AMMONIA" in the last 168 hours. CBC: Recent Labs  Lab 12/08/23 0022 12/09/23 0544 12/10/23 0446  WBC 21.4* 10.7* 7.9  NEUTROABS 18.5*  --  3.7  HGB 14.3 13.0 13.8  HCT 44.1 40.6 44.4  MCV 93.8 94.2 95.3  PLT 259 263 333   Cardiac Enzymes: No results for input(s): "CKTOTAL", "CKMB", "CKMBINDEX", "TROPONINI" in the last 168 hours. BNP: Invalid input(s): "POCBNP" CBG: No results for input(s): "GLUCAP" in the last 168 hours. D-Dimer No results for input(s): "DDIMER" in the last 72 hours. Hgb A1c No results for input(s): "HGBA1C" in the last 72 hours. Lipid Profile No results for input(s): "CHOL", "HDL", "LDLCALC", "TRIG", "CHOLHDL", "LDLDIRECT" in the last 72 hours. Thyroid function studies No results for input(s): "TSH", "T4TOTAL", "  T3FREE", "THYROIDAB" in the last 72 hours.  Invalid  input(s): "FREET3" Anemia work up No results for input(s): "VITAMINB12", "FOLATE", "FERRITIN", "TIBC", "IRON", "RETICCTPCT" in the last 72 hours. Urinalysis    Component Value Date/Time   COLORURINE YELLOW 12/08/2023 0011   APPEARANCEUR CLOUDY (A) 12/08/2023 0011   LABSPEC 1.024 12/08/2023 0011   PHURINE 5.0 12/08/2023 0011   GLUCOSEU NEGATIVE 12/08/2023 0011   HGBUR MODERATE (A) 12/08/2023 0011   BILIRUBINUR NEGATIVE 12/08/2023 0011   KETONESUR NEGATIVE 12/08/2023 0011   PROTEINUR 100 (A) 12/08/2023 0011   NITRITE POSITIVE (A) 12/08/2023 0011   LEUKOCYTESUR LARGE (A) 12/08/2023 0011   Sepsis Labs Recent Labs  Lab 12/08/23 0022 12/09/23 0544 12/10/23 0446  WBC 21.4* 10.7* 7.9   Microbiology Recent Results (from the past 240 hours)  Resp panel by RT-PCR (RSV, Flu A&B, Covid) Anterior Nasal Swab     Status: None   Collection Time: 12/08/23 12:11 AM   Specimen: Anterior Nasal Swab  Result Value Ref Range Status   SARS Coronavirus 2 by RT PCR NEGATIVE NEGATIVE Final    Comment: (NOTE) SARS-CoV-2 target nucleic acids are NOT DETECTED.  The SARS-CoV-2 RNA is generally detectable in upper respiratory specimens during the acute phase of infection. The lowest concentration of SARS-CoV-2 viral copies this assay can detect is 138 copies/mL. A negative result does not preclude SARS-Cov-2 infection and should not be used as the sole basis for treatment or other patient management decisions. A negative result may occur with  improper specimen collection/handling, submission of specimen other than nasopharyngeal swab, presence of viral mutation(s) within the areas targeted by this assay, and inadequate number of viral copies(<138 copies/mL). A negative result must be combined with clinical observations, patient history, and epidemiological information. The expected result is Negative.  Fact Sheet for Patients:  BloggerCourse.com  Fact Sheet for Healthcare  Providers:  SeriousBroker.it  This test is no t yet approved or cleared by the [place]  FDA and  has been authorized for detection and/or diagnosis of SARS-CoV-2 by FDA under an Emergency Use Authorization (EUA). This EUA will remain  in effect (meaning this test can be used) for the duration of the COVID-19 declaration under Section 564(b)(1) of the Act, 21 U.S.C.section 360bbb-3(b)(1), unless the authorization is terminated  or revoked sooner.       Influenza A by PCR NEGATIVE NEGATIVE Final   Influenza B by PCR NEGATIVE NEGATIVE Final    Comment: (NOTE) The Xpert Xpress SARS-CoV-2/FLU/RSV plus assay is intended as an aid in the diagnosis of influenza from Nasopharyngeal swab specimens and should not be used as a sole basis for treatment. Nasal washings and aspirates are unacceptable for Xpert Xpress SARS-CoV-2/FLU/RSV testing.  Fact Sheet for Patients: BloggerCourse.com  Fact Sheet for Healthcare Providers: SeriousBroker.it  This test is not yet approved or cleared by the [place]  FDA and has been authorized for detection and/or diagnosis of SARS-CoV-2 by FDA under an Emergency Use Authorization (EUA). This EUA will remain in effect (meaning this test can be used) for the duration of the COVID-19 declaration under Section 564(b)(1) of the Act, 21 U.S.C. section 360bbb-3(b)(1), unless the authorization is terminated or revoked.     Resp Syncytial Virus by PCR NEGATIVE NEGATIVE Final    Comment: (NOTE) Fact Sheet for Patients: BloggerCourse.com  Fact Sheet for Healthcare Providers: SeriousBroker.it  This test is not yet approved or cleared by the [place]  FDA and has been authorized for detection and/or diagnosis of SARS-CoV-2 by FDA under  an Emergency Use Authorization (EUA). This EUA will remain in effect (meaning this test can be  used) for the duration of the COVID-19 declaration under Section 564(b)(1) of the Act, 21 U.S.C. section 360bbb-3(b)(1), unless the authorization is terminated or revoked.  Performed at [facility], [address] [place], [place] [address]   Urine Culture     Status: Abnormal   Collection Time: 12/08/23 12:11 AM   Specimen: Urine, Random  Result Value Ref Range Status   Specimen Description   Final    URINE, RANDOM Performed at [facility], [address], [place], [place] [address]    Special Requests   Final    NONE Reflexed from [phone] Performed at [facility], [address] [place], [place] [address]    Culture >=100,000 COLONIES/mL ESCHERICHIA COLI (A)  Final   Report Status 12/10/2023 FINAL  Final   Organism ID, Bacteria ESCHERICHIA COLI (A)  Final      Susceptibility   Escherichia coli - MIC*    AMPICILLIN >=32 RESISTANT Resistant     CEFAZOLIN 16 SENSITIVE Sensitive     CEFEPIME <=0.12 SENSITIVE Sensitive     CEFTRIAXONE <=0.25 SENSITIVE Sensitive     CIPROFLOXACIN <=0.25 SENSITIVE Sensitive     GENTAMICIN <=1 SENSITIVE Sensitive     IMIPENEM <=0.25 SENSITIVE Sensitive     NITROFURANTOIN <=16 SENSITIVE Sensitive     TRIMETH/SULFA <=20 SENSITIVE Sensitive     AMPICILLIN/SULBACTAM >=32 RESISTANT Resistant     PIP/TAZO <=4 SENSITIVE Sensitive ug/mL    * >=100,000 COLONIES/mL ESCHERICHIA COLI  Blood Culture (routine x 2)     Status: None (Preliminary result)   Collection Time: 12/08/23 12:18 AM   Specimen: BLOOD  Result Value Ref Range Status   Specimen Description   Final    BLOOD RIGHT ANTECUBITAL Performed at [facility], [address], [place], [place] [address]    Special Requests   Final    BOTTLES DRAWN AEROBIC AND ANAEROBIC Blood Culture results may not be optimal due to an inadequate volume of blood received in culture bottles Performed at [facility],  [address], [place], [place] [address]    Culture   Final    NO GROWTH 2 DAYS Performed at [facility], [address], [place], [place] [address]    Report Status PENDING  Incomplete  Blood Culture (routine x 2)     Status: None (Preliminary result)   Collection Time: 12/08/23 12:43 AM   Specimen: BLOOD LEFT FOREARM  Result Value Ref Range Status   Specimen Description   Final    BLOOD LEFT FOREARM Performed at [facility], [address] [place], [place] [address]    Special Requests   Final    BOTTLES DRAWN AEROBIC AND ANAEROBIC Blood Culture results may not be optimal due to an inadequate volume of blood received in culture bottles Performed at [facility], [address] [place], [place] [address]    Culture   Final    NO GROWTH 2 DAYS Performed at [facility], [address], [place], [place] [address]    Report Status PENDING  Incomplete  Group A Strep by PCR     Status: Abnormal   Collection Time: 12/08/23 12:54 AM   Specimen: Throat; Sterile Swab  Result Value Ref Range Status   Group A Strep by PCR DETECTED (A) NOT DETECTED Final  Comment: Performed at [facility], [address] [place], [place] [address]    Procedures/Studies: ECHOCARDIOGRAM COMPLETE Result Date: 12/08/2023    ECHOCARDIOGRAM REPORT   Patient Name:   Morgan Donaldson Date of Exam: 12/08/2023 Medical Rec #:  [id number]    Height:       67.0 in Accession #:    [id number]   Weight:       112.0 lb Date of Birth:  10-18-1980    BSA:          1.581 m Patient Age:    42 years     BP:           118/46 mmHg Patient Gender: F            HR:           81 bpm. Exam Location:  Inpatient Procedure: 2D Echo, Cardiac Doppler and Color Doppler (Both Spectral and Color            Flow Doppler were utilized during procedure). Indications:    Endocarditis  History:        Patient has prior history of Echocardiogram examinations, most                 recent  05/26/2019.  Sonographer:    Juanita Shaw Referring Phys: [id number] [facility] R SPONSELLER IMPRESSIONS  1. Left ventricular ejection fraction, by estimation, is 65 to 70%. The left ventricle has normal function. The left ventricle has no regional wall motion abnormalities. Left ventricular diastolic parameters were normal.  2. Right ventricular systolic function is hyperdynamic. The right ventricular size is normal.  3. The mitral valve is normal in structure. No evidence of mitral valve regurgitation. No evidence of mitral stenosis. There is mild holosystolic prolapse of the medial segment of the anterior leaflet of the mitral valve.  4. The aortic valve was not well visualized. Aortic valve regurgitation is not visualized. No aortic stenosis is present.  5. The inferior vena cava is normal in size with greater than 50% respiratory variability, suggesting right atrial pressure of 3 mmHg. Comparison(s): Prior images reviewed side by side. Function is more dynamic from 2020. Conclusion(s)/Recommendation(s): No evidence of valvular vegetations on this transthoracic echocardiogram. Consider a transesophageal echocardiogram to exclude infective endocarditis if clinically indicated. FINDINGS  Left Ventricle: No strain or 3D transmitted. Left ventricular ejection fraction, by estimation, is 65 to 70%. The left ventricle has normal function. The left ventricle has no regional wall motion abnormalities. Strain was performed and the global longitudinal strain is indeterminate. The left ventricular internal cavity size was normal in size. There is no left ventricular hypertrophy. Left ventricular diastolic parameters were normal. Right Ventricle: The right ventricular size is normal. No increase in right ventricular wall thickness. Right ventricular systolic function is hyperdynamic. Left Atrium: Left atrial size was normal in size. Right Atrium: Right atrial size was normal in size. Pericardium: There is no evidence of  pericardial effusion. Mitral Valve: The mitral valve is normal in structure. There is mild holosystolic prolapse of the medial segment of the anterior leaflet of the mitral valve. No evidence of mitral valve regurgitation. No evidence of mitral valve stenosis. MV peak gradient, 2.6 mmHg. The mean mitral valve gradient is 1.0 mmHg. Tricuspid Valve: The tricuspid valve is normal in structure. Tricuspid valve regurgitation is mild . No evidence of tricuspid stenosis. Aortic Valve: The aortic valve was not well visualized. Aortic valve regurgitation is not visualized. No aortic stenosis is present. Aortic  valve mean gradient measures 4.0 mmHg. Aortic valve peak gradient measures 7.1 mmHg. Aortic valve area, by VTI measures 2.95 cm. Pulmonic Valve: The pulmonic valve was grossly normal. Pulmonic valve regurgitation is not visualized. No evidence of pulmonic stenosis. Aorta: The aortic root and ascending aorta are structurally normal, with no evidence of dilitation. Venous: The inferior vena cava is normal in size with greater than 50% respiratory variability, suggesting right atrial pressure of 3 mmHg. IAS/Shunts: The atrial septum is grossly normal. Additional Comments: 3D was performed not requiring image post processing on an independent workstation and was indeterminate.  LEFT VENTRICLE PLAX 2D LVIDd:         3.80 cm     Diastology LVIDs:         2.60 cm     LV e' medial:    10.60 cm/s LV PW:         0.70 cm     LV E/e' medial:  7.6 LV IVS:        0.70 cm     LV e' lateral:   14.00 cm/s LVOT diam:     1.90 cm     LV E/e' lateral: 5.8 LV SV:         66 LV SV Index:   42 LVOT Area:     2.84 cm  LV Volumes (MOD) LV vol d, MOD A2C: 88.0 ml LV vol d, MOD A4C: 97.4 ml LV vol s, MOD A2C: 17.2 ml LV vol s, MOD A4C: 29.7 ml LV SV MOD A2C:     70.8 ml LV SV MOD A4C:     97.4 ml LV SV MOD BP:      75.8 ml RIGHT VENTRICLE             IVC RV Basal diam:  2.80 cm     IVC diam: 1.20 cm RV Mid diam:    1.90 cm RV S prime:      18.60 cm/s TAPSE (M-mode): 2.4 cm LEFT ATRIUM             Index        RIGHT ATRIUM          Index LA diam:        2.40 cm 1.52 cm/m   RA Area:     6.36 cm LA Vol (A2C):   25.7 ml 16.26 ml/m  RA Volume:   9.96 ml  6.30 ml/m LA Vol (A4C):   25.8 ml 16.32 ml/m LA Biplane Vol: 26.0 ml 16.45 ml/m  AORTIC VALVE                    PULMONIC VALVE AV Area (Vmax):    2.49 cm     PV Vmax:       1.11 m/s AV Area (Vmean):   2.48 cm     PV Peak grad:  5.0 mmHg AV Area (VTI):     2.95 cm AV Vmax:           133.00 cm/s AV Vmean:          95.200 cm/s AV VTI:            0.225 m AV Peak Grad:      7.1 mmHg AV Mean Grad:      4.0 mmHg LVOT Vmax:         117.00 cm/s LVOT Vmean:        83.200 cm/s LVOT VTI:  0.234 m LVOT/AV VTI ratio: 1.04  AORTA Ao Root diam: 2.80 cm Ao Asc diam:  2.40 cm MITRAL VALVE MV Area (PHT): 2.66 cm    SHUNTS MV Area VTI:   3.39 cm    Systemic VTI:  0.23 m MV Peak grad:  2.6 mmHg    Systemic Diam: 1.90 cm MV Mean grad:  1.0 mmHg MV Vmax:       0.81 m/s MV Vmean:      52.2 cm/s MV Decel Time: 285 msec MV E velocity: 80.60 cm/s MV A velocity: 67.70 cm/s MV E/A ratio:  1.19 Gloriann Larger MD Electronically signed by Gloriann Larger MD Signature Date/Time: 12/08/2023/1:16:07 PM    Final    CT ABDOMEN PELVIS W CONTRAST Result Date: 12/08/2023 CLINICAL DATA:  Abdominal pain EXAM: CT ABDOMEN AND PELVIS WITH CONTRAST TECHNIQUE: Multidetector CT imaging of the abdomen and pelvis was performed using the standard protocol following bolus administration of intravenous contrast. RADIATION DOSE REDUCTION: This exam was performed according to the departmental dose-optimization program which includes automated exposure control, adjustment of the mA and/or kV according to patient size and/or use of iterative reconstruction technique. CONTRAST:  80mL OMNIPAQUE IOHEXOL 300 MG/ML  SOLN COMPARISON:  07/13/2019 FINDINGS: Lower chest: No acute abnormality Hepatobiliary: No focal hepatic abnormality.  Gallbladder unremarkable. Pancreas: No focal abnormality or ductal dilatation. Spleen: No focal abnormality.  Normal size. Adrenals/Urinary Tract: No adrenal abnormality. No focal renal abnormality. No stones or hydronephrosis. Urinary bladder is unremarkable. Stomach/Bowel: Bowel evaluation limited due to lack of oral contrast and little intra-abdominal fat. No bowel dilatation. No evidence of obstruction or inflammation. Vascular/Lymphatic: No evidence of aneurysm or adenopathy. Reproductive: Uterus and adnexa unremarkable.  No mass. Other: No free fluid or free air. Musculoskeletal: Bilateral screws across the SI joints. Plate and screw fixation across the pubic symphysis. No acute bony abnormality. IMPRESSION: No acute findings in the abdomen or pelvis. Electronically Signed   By: Janeece Mechanic M.D.   On: 12/08/2023 02:45   DG Chest Port 1 View Result Date: 12/08/2023 CLINICAL DATA:  Chest pain, questionable sepsis. EXAM: PORTABLE CHEST 1 VIEW COMPARISON:  04/29/2019 FINDINGS: The heart size and mediastinal contours are within normal limits. Both lungs are clear. The visualized skeletal structures are unremarkable. IMPRESSION: No active disease. Electronically Signed   By: Janeece Mechanic M.D.   On: 12/08/2023 01:04   DG Shoulder Left Result Date: 11/29/2023 CLINICAL DATA:  Four day history of posterior left shoulder pain after fall EXAM: LEFT SHOULDER - 3 VIEW COMPARISON:  None Available. FINDINGS: There is no evidence of fracture or dislocation. There is no evidence of arthropathy or other focal bone abnormality. Soft tissues are unremarkable. IMPRESSION: No acute fracture or dislocation. Electronically Signed   By: Limin  Xu M.D.   On: 11/29/2023 14:32     Time coordinating discharge: Over 30 minutes    Faith Homes, MD  [facility] 12/10/2023, 1:06 PM

## 2023-12-10 NOTE — Assessment & Plan Note (Signed)
-   Gonorrhea testing positive after discharge -Tried calling patient on phone numbers listed in epic but incorrect numbers are entered and no answers under emergency contact -Regardless, she has been sufficiently treated with Rocephin during hospitalization

## 2023-12-13 LAB — CULTURE, BLOOD (ROUTINE X 2): Culture: NO GROWTH

## 2024-01-26 ENCOUNTER — Encounter (HOSPITAL_COMMUNITY): Payer: Self-pay

## 2024-01-26 ENCOUNTER — Other Ambulatory Visit: Payer: Self-pay

## 2024-01-26 ENCOUNTER — Ambulatory Visit (HOSPITAL_COMMUNITY)
Admission: EM | Admit: 2024-01-26 | Discharge: 2024-01-26 | Disposition: A | Discharge status: Home / Self Care | Attending: Family Medicine | Admitting: Family Medicine

## 2024-01-26 ENCOUNTER — Emergency Department (HOSPITAL_COMMUNITY)
Admission: EM | Admit: 2024-01-26 | Discharge: 2024-01-26 | Disposition: A | Discharge status: Home / Self Care | Attending: Emergency Medicine | Admitting: Emergency Medicine

## 2024-01-26 DIAGNOSIS — F319 Bipolar disorder, unspecified: Secondary | ICD-10-CM | POA: Insufficient documentation

## 2024-01-26 DIAGNOSIS — I809 Phlebitis and thrombophlebitis of unspecified site: Secondary | ICD-10-CM | POA: Insufficient documentation

## 2024-01-26 DIAGNOSIS — F1910 Other psychoactive substance abuse, uncomplicated: Secondary | ICD-10-CM | POA: Diagnosis present

## 2024-01-26 DIAGNOSIS — F909 Attention-deficit hyperactivity disorder, unspecified type: Secondary | ICD-10-CM | POA: Insufficient documentation

## 2024-01-26 DIAGNOSIS — F1193 Opioid use, unspecified with withdrawal: Secondary | ICD-10-CM | POA: Diagnosis present

## 2024-01-26 DIAGNOSIS — F17290 Nicotine dependence, other tobacco product, uncomplicated: Secondary | ICD-10-CM | POA: Diagnosis not present

## 2024-01-26 DIAGNOSIS — F419 Anxiety disorder, unspecified: Secondary | ICD-10-CM | POA: Insufficient documentation

## 2024-01-26 DIAGNOSIS — Z5900 Homelessness unspecified: Secondary | ICD-10-CM | POA: Insufficient documentation

## 2024-01-26 MED ORDER — HALOPERIDOL LACTATE 5 MG/ML IJ SOLN: 5.0000 mg | Freq: Three times a day (TID) | INTRAMUSCULAR | Status: DC | PRN

## 2024-01-26 MED ORDER — TRAZODONE HCL 50 MG PO TABS: 50.0000 mg | ORAL_TABLET | Freq: Every evening | ORAL | Status: DC | PRN

## 2024-01-26 MED ORDER — ONDANSETRON 4 MG PO TBDP
4.0000 mg | ORAL_TABLET | Freq: Once | ORAL | Status: AC
  Administered 2024-01-26: 4 mg via ORAL
  Filled 2024-01-26: qty 1

## 2024-01-26 MED ORDER — LORAZEPAM 2 MG/ML IJ SOLN: 2.0000 mg | Freq: Three times a day (TID) | INTRAMUSCULAR | Status: DC | PRN

## 2024-01-26 MED ORDER — DIPHENHYDRAMINE HCL 50 MG PO CAPS: 50.0000 mg | ORAL_CAPSULE | Freq: Three times a day (TID) | ORAL | Status: DC | PRN

## 2024-01-26 MED ORDER — HALOPERIDOL LACTATE 5 MG/ML IJ SOLN: 10.0000 mg | Freq: Three times a day (TID) | INTRAMUSCULAR | Status: DC | PRN

## 2024-01-26 MED ORDER — LORATADINE 10 MG PO TABS
10.0000 mg | ORAL_TABLET | Freq: Once | ORAL | Status: AC
  Administered 2024-01-26: 10 mg via ORAL
  Filled 2024-01-26: qty 1

## 2024-01-26 MED ORDER — ALUM & MAG HYDROXIDE-SIMETH 200-200-20 MG/5ML PO SUSP: 30.0000 mL | ORAL | Status: DC | PRN

## 2024-01-26 MED ORDER — HALOPERIDOL 5 MG PO TABS: 5.0000 mg | ORAL_TABLET | Freq: Three times a day (TID) | ORAL | Status: DC | PRN

## 2024-01-26 MED ORDER — DIPHENHYDRAMINE HCL 50 MG/ML IJ SOLN: 50.0000 mg | Freq: Three times a day (TID) | INTRAMUSCULAR | Status: DC | PRN

## 2024-01-26 MED ORDER — MAGNESIUM HYDROXIDE 400 MG/5ML PO SUSP: 30.0000 mL | Freq: Every day | ORAL | Status: DC | PRN

## 2024-01-26 MED ORDER — ACETAMINOPHEN 325 MG PO TABS: 650.0000 mg | ORAL_TABLET | Freq: Four times a day (QID) | ORAL | Status: DC | PRN

## 2024-01-26 MED ORDER — HYDROXYZINE HCL 25 MG PO TABS: 50.0000 mg | ORAL_TABLET | Freq: Three times a day (TID) | ORAL | Status: DC | PRN

## 2024-01-26 MED ORDER — CEPHALEXIN 500 MG PO CAPS: 500.0000 mg | ORAL_CAPSULE | Freq: Four times a day (QID) | ORAL | 0 refills | Status: DC

## 2024-01-26 MED ORDER — KETOROLAC TROMETHAMINE 15 MG/ML IJ SOLN
15.0000 mg | Freq: Once | INTRAMUSCULAR | Status: AC
  Administered 2024-01-26: 15 mg via INTRAMUSCULAR
  Filled 2024-01-26: qty 1

## 2024-01-26 MED ORDER — ONDANSETRON 4 MG PO TBDP
4.0000 mg | ORAL_TABLET | Freq: Three times a day (TID) | ORAL | 0 refills | Status: AC | PRN
Start: 2024-01-26 — End: ?

## 2024-01-26 NOTE — Discharge Instructions (Signed)
 [facility]  Phone [phone]  Address [address] [place], [place] [address]  Hours Open 24/7. No appointment required.

## 2024-01-26 NOTE — Progress Notes (Signed)
   01/26/24 1431  [facility] Triage Screening (Walk-ins at [facility] only)  What Is the Reason for Your Visit/Call Today? Pt presents to [facility] voluntarily accompanied by boyfriend and friend. 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 Pt denies SI, HI, AVH, Alcohol use. Pt used herion last night at 7pm $10  How Long Has This Been Causing You Problems? 1 wk - 1 month  Have You Recently Had Any Thoughts About Hurting Yourself? No  Are You Planning to Commit Suicide/Harm Yourself At This time? No  Have you Recently Had Thoughts About Hurting Someone Marigene Shoulder? No  Are You Planning To Harm Someone At This Time? No  Physical Abuse Denies  Verbal Abuse Denies  Sexual Abuse Denies  Exploitation of patient/patient's resources Denies  Self-Neglect Yes, present (Comment)  Are you currently experiencing any auditory, visual or other hallucinations? No  Have You Used Any Alcohol or Drugs in the Past 24 Hours? Yes  What Did You Use and How Much? Herion last night at 7pm $10  Clinician description of patient physical appearance/behavior: Pt is calm and cooperative, states she is in withdraw  What Do You Feel Would Help You the Most Today? Alcohol or Drug Use Treatment  Determination of Need Routine (7 days)  Options For Referral Facility-Based Crisis;Inpatient Hospitalization;Intensive Outpatient Therapy;Medication Management;Chemical Dependency Intensive Outpatient Therapy (CDIOP)

## 2024-01-26 NOTE — ED Provider Notes (Signed)
 Behavioral Health Urgent Care Medical Screening Exam Date: 01/26/24 Patient Name: Morgan Donaldson MRN: [id number] Chief Complaint: "To get off heroin"    Diagnoses:  Final diagnoses:  Opioid use with withdrawal [facility])      HPI: Morgan Donaldson is a 43 yo female who presents to [facility] voluntarily accompanied by boyfriend and friend. 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 Pt denies SI, HI, AVH, Alcohol use. Pt used herion last night at 7pm $10    Morgan Donaldson is seen face-to-face at [facility]. She is alert & oriented x 4 and appears to be a reliable historian. Today, she states she wants "to get off heroin." States she has never received inpatient substance use detox for outpatient substance use treatment.  She denies ever being on medication for MOUD.  Substance use as noted below.  She also endorses depression and anxiety.  Past mental health diagnoses of ADHD and bipolar disorder.  Not currently on medication for either. Previous medication Adderall, Wellbutrin , Gabapentin, Seroquel. States she has not been on psychotropic medication in 1.5 years. States she discontinued medication "because I felt like I was getting addicted to Adderall."  She was seen in both the [place] [facility] and [facility] emergency departments today for similar complaints and discharged with outpatient resources. She is endorsing mild symptoms of withdrawal to include joint aches, abdominal pain, nausea, and yawning.  She denies vomiting or diarrhea.  She denies suicidal or homicidal ideation, intent, or plan.  She denies AVH. Tobacco: black/mild cigars smokes 2 cigars a day Heroin: first use at age 64, uses $10 worth "every two days" via IV with last use yesterday 1700-1800 Fentanyl : first use at age 74, uses $10 worth "every two days" via IV with last use yesterday 1700-1800 THC: occasional use with last use yesterday ETOH: denies Methamphetamine: denies Cocaine: daily use via smoking with last use 2 days ago Benzodiazepines: denies Drug of  choice: "heroin" Longest sobriety: 15 years Overdose: Last overdose "2 weeks ago"   Housing: "anywhere" currently unhoused    Total Time spent with patient: 15 minutes   Musculoskeletal  Strength & Muscle Tone: within normal limits Gait & Station: normal Patient leans: N/A   Psychiatric Specialty Exam  Presentation General Appearance: Disheveled; Appropriate for Environment   Eye Contact:Good   Speech:Clear and Coherent; Normal Rate   Speech Volume:Normal   Handedness:Right     Mood and Affect  Mood:Depressed; Anxious   Affect:Congruent     Thought Process  Thought Processes:Coherent   Descriptions of Associations:Intact   Orientation:Full (Time, Place and Person)   Thought Content:Logical    Hallucinations:Hallucinations: None   Ideas of Reference:None   Suicidal Thoughts:Suicidal Thoughts: No   Homicidal Thoughts:Homicidal Thoughts: No     Sensorium  Memory:Remote Good; Recent Good   Judgment:Good   Insight:Fair     Executive Functions  Concentration:Fair   Attention Span:Fair   Recall:Good   Fund of Knowledge:Good   Language:Good     Psychomotor Activity  Psychomotor Activity:Psychomotor Activity: Normal     Assets  Assets:Communication Skills; Desire for Improvement; Resilience     Sleep  Sleep:Sleep: Fair Number of Hours of Sleep: 5     Nutritional Assessment (For OBS and FBC admissions only) Has the patient had a weight loss or gain of 10 pounds or more in the last 3 months?: No Has the patient had a decrease in food intake/or appetite?: Yes Does the patient have dental problems?: Yes Does the patient have eating habits or behaviors that  may be indicators of an eating disorder including binging or inducing vomiting?: No Has the patient recently lost weight without trying?: 0 Has the patient been eating poorly because of a decreased appetite?: 1 Malnutrition Screening Tool Score: 1       Physical Exam Vitals and  nursing note reviewed.  HENT:     Head: Normocephalic.     Mouth/Throat:     Mouth: Mucous membranes are moist.  Cardiovascular:     Rate and Rhythm: Normal rate.  Pulmonary:     Effort: Pulmonary effort is normal.  Musculoskeletal:     Cervical back: Normal range of motion.  Skin:    General: Skin is warm and dry.     Findings: Erythema present.     Comments: Erythema noted to right antecubital fossa and left posterior thigh. Phlebitis vs poison ivy. Visible injection marks.   Neurological:     Mental Status: She is alert and oriented to person, place, and time.  Psychiatric:        Mood and Affect: Mood normal.        Behavior: Behavior normal.        Thought Content: Thought content normal.      Review of Systems  Constitutional:  Positive for malaise/fatigue. Negative for fever.  HENT:  Negative for congestion.   Respiratory:  Negative for cough and shortness of breath.   Cardiovascular:  Negative for palpitations.  Gastrointestinal:  Positive for nausea. Negative for diarrhea and vomiting.  Musculoskeletal:  Positive for joint pain.  Psychiatric/Behavioral:  Positive for depression and substance abuse. Negative for hallucinations and suicidal ideas. The patient is nervous/anxious.       Blood pressure (!) 155/81, pulse 63, temperature 97.9 F (36.6 C), temperature source Oral, resp. rate 17, SpO2 100%. There is no height or weight on file to calculate BMI.   Past Psychiatric History: ADHD, bipolar disorder Hospitalization/Rehab: denies Previous medication trials: Adderall, Wellbutrin , Seroquel     Is the patient at risk to self? No  Has the patient been a risk to self in the past 6 months? No .    Has the patient been a risk to self within the distant past? No   Is the patient a risk to others? No   Has the patient been a risk to others in the past 6 months? No   Has the patient been a risk to others within the distant past? No    Past Medical History: None  reported    Family History: none reported    Social History: unhoused. 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   BUN 12/08/2023 18  6 - 20 mg/dL Final   Creatinine, Ser 12/08/2023 0.72  0.44 - 1.00 mg/dL Final   Calcium 16/05/9603 9.4  8.9 - 10.3 mg/dL Final  Total Protein 12/08/2023 8.3 (H)  6.5 - 8.1 g/dL Final   Albumin 57/84/6962 4.0  3.5 - 5.0 g/dL Final   AST 95/28/4132 18  15 - 41 U/L Final   ALT 12/08/2023 13  0 - 44 U/L Final   Alkaline Phosphatase 12/08/2023 57  38 - 126 U/L Final   Total Bilirubin 12/08/2023 0.5  0.0 - 1.2 mg/dL Final   GFR, Estimated 12/08/2023 >60  >60 mL/min Final    Comment: (NOTE) Calculated using the CKD-EPI Creatinine Equation (2021)     Anion gap 12/08/2023 13  5 - 15 Final    Performed at [facility], [address] [place], [place] [address]   WBC 12/08/2023 21.4 (H)  4.0 - 10.5 K/uL Final   RBC 12/08/2023 4.70  3.87 - 5.11 MIL/uL Final   Hemoglobin 12/08/2023 14.3  12.0 - 15.0 g/dL Final   HCT 27/25/3664 44.1  36.0 - 46.0 % Final   MCV 12/08/2023 93.8  80.0 - 100.0 fL Final   MCH 12/08/2023 30.4  26.0 - 34.0 pg Final   MCHC 12/08/2023 32.4  30.0 - 36.0 g/dL Final   RDW 40/34/7425 13.0  11.5 - 15.5 % Final   Platelets 12/08/2023 259  150 - 400 K/uL Final   nRBC 12/08/2023 0.0  0.0 - 0.2 % Final   Neutrophils Relative % 12/08/2023 86  % Final   Neutro Abs 12/08/2023 18.5 (H)  1.7 - 7.7 K/uL  Final   Lymphocytes Relative 12/08/2023 6  % Final   Lymphs Abs 12/08/2023 1.2  0.7 - 4.0 K/uL Final   Monocytes Relative 12/08/2023 7  % Final   Monocytes Absolute 12/08/2023 1.5 (H)  0.1 - 1.0 K/uL Final   Eosinophils Relative 12/08/2023 0  % Final   Eosinophils Absolute 12/08/2023 0.0  0.0 - 0.5 K/uL Final   Basophils Relative 12/08/2023 0  % Final   Basophils Absolute 12/08/2023 0.1  0.0 - 0.1 K/uL Final   Immature Granulocytes 12/08/2023 1  % Final   Abs Immature Granulocytes 12/08/2023 0.19 (H)  0.00 - 0.07 K/uL Final    Performed at [facility], [address] [place], [place] [address]   Specimen Description 12/08/2023     Final                   Value:BLOOD RIGHT ANTECUBITAL Performed at [facility], [address] [place], [place] [address]     Special Requests 12/08/2023     Final                   Value:BOTTLES DRAWN AEROBIC AND ANAEROBIC Blood Culture results may not be optimal due to an inadequate volume of blood received in culture bottles Performed at [facility], [address] [place], [place] [address]     Culture 12/08/2023     Final                   Value:NO GROWTH 5 DAYS Performed at [facility], [address] [place], [place] [address]     Report Status 12/08/2023 12/13/2023 FINAL    Final   Specimen Description 12/08/2023     Final                   Value:BLOOD LEFT FOREARM Performed at [facility], [address] [place], [place] [address]     Special Requests 12/08/2023  Final                   Value:BOTTLES DRAWN AEROBIC AND ANAEROBIC Blood Culture results may not be optimal due to an inadequate volume of blood received in culture bottles Performed at [facility], [address] [place], [place] [address]     Culture 12/08/2023     Final                   Value:NO GROWTH 5 DAYS Performed at [facility], [address] [place], [place]  [address]     Report Status 12/08/2023 12/13/2023 FINAL    Final   Preg, Serum 12/08/2023 NEGATIVE  NEGATIVE Final    Comment:        THE SENSITIVITY OF THIS METHODOLOGY IS >10 mIU/mL. Performed at [facility], [address] [place], [place] [address]     Specimen Source 12/08/2023 URINE, CLEAN CATCH    Final   Color, Urine 12/08/2023 YELLOW  YELLOW Final   APPearance 12/08/2023 CLOUDY (A)  CLEAR Final   Specific Gravity, Urine 12/08/2023 1.024  1.005 - 1.030 Final   pH 12/08/2023 5.0  5.0 - 8.0 Final   Glucose, UA 12/08/2023 NEGATIVE  NEGATIVE mg/dL Final   Hgb urine dipstick 12/08/2023 MODERATE (A)  NEGATIVE Final   Bilirubin Urine 12/08/2023 NEGATIVE  NEGATIVE Final   Ketones, ur 12/08/2023 NEGATIVE  NEGATIVE mg/dL Final   Protein, ur 95/28/4132 100 (A)  NEGATIVE mg/dL Final   Nitrite 44/08/270 POSITIVE (A)  NEGATIVE Final   Leukocytes,Ua 12/08/2023 LARGE (A)  NEGATIVE Final   RBC / HPF 12/08/2023 11-20  0 - 5 RBC/hpf Final   WBC, UA 12/08/2023 >50  0 - 5 WBC/hpf Final    Comment:        Reflex urine culture not performed if WBC <=10, OR if Squamous epithelial cells >5. If Squamous epithelial cells >5 suggest recollection.     Bacteria, UA 12/08/2023 MANY (A)  NONE SEEN Final   Squamous Epithelial / HPF 12/08/2023 0-5  0 - 5 /HPF Final   Mucus 12/08/2023 PRESENT    Final    Performed at [facility], [address] [place], [place] [address]   Opiates 12/08/2023 NONE DETECTED  NONE DETECTED Final   Cocaine 12/08/2023 POSITIVE (A)  NONE DETECTED Final   Benzodiazepines 12/08/2023 POSITIVE (A)  NONE DETECTED Final   Amphetamines 12/08/2023 NONE DETECTED  NONE DETECTED Final   Tetrahydrocannabinol 12/08/2023 POSITIVE (A)  NONE DETECTED Final   Barbiturates 12/08/2023 NONE DETECTED  NONE DETECTED Final    Comment: (NOTE) DRUG SCREEN FOR MEDICAL PURPOSES ONLY.  IF CONFIRMATION IS NEEDED FOR ANY PURPOSE, NOTIFY LAB WITHIN 5 DAYS.    LOWEST DETECTABLE LIMITS FOR URINE DRUG SCREEN Drug Class                     Cutoff (ng/mL) Amphetamine  and metabolites    1000 Barbiturate and metabolites    200 Benzodiazepine                 200 Opiates and metabolites        300 Cocaine and metabolites        300 THC                            50 Performed at [facility], [address] [place],   [address]     Troponin I (High Sensitivity) 12/08/2023 3  <18 ng/L Final    Comment: (NOTE) Elevated high sensitivity troponin I (hsTnI) values and significant  changes across serial measurements may suggest ACS but many other  chronic and acute conditions are known to elevate hsTnI results.  Refer to the "Links" section for chest pain algorithms and additional  guidance. Performed at [facility], [address], [place], [place] [address]     SARS Coronavirus 2 by RT PCR 12/08/2023 NEGATIVE  NEGATIVE Final    Comment: (NOTE) SARS-CoV-2 target nucleic acids are NOT DETECTED.   The SARS-CoV-2 RNA is generally detectable in upper respiratory specimens during the acute phase of infection. The lowest concentration of SARS-CoV-2 viral copies this assay can detect is 138 copies/mL. A negative result does not preclude SARS-Cov-2 infection and should not be used as the sole basis for treatment or other patient management decisions. A negative result may occur with  improper specimen collection/handling, submission of specimen other than nasopharyngeal swab, presence of viral mutation(s) within the areas targeted by this assay, and inadequate number of viral copies(<138 copies/mL). A negative result must be combined with clinical observations, patient history, and epidemiological information. The expected result is Negative.   Fact Sheet for Patients:  BloggerCourse.com   Fact Sheet for Healthcare Providers:  SeriousBroker.it   This  test is no                           t yet approved or cleared by the [place]  FDA and  has been authorized for detection and/or diagnosis of SARS-CoV-2 by FDA under an Emergency Use Authorization (EUA). This EUA will remain  in effect (meaning this test can be used) for the duration of the COVID-19 declaration under Section 564(b)(1) of the Act, 21 U.S.C.section 360bbb-3(b)(1), unless the authorization is terminated  or revoked sooner.          Influenza A by PCR 12/08/2023 NEGATIVE  NEGATIVE Final   Influenza B by PCR 12/08/2023 NEGATIVE  NEGATIVE Final    Comment: (NOTE) The Xpert Xpress SARS-CoV-2/FLU/RSV plus assay is intended as an aid in the diagnosis of influenza from Nasopharyngeal swab specimens and should not be used as a sole basis for treatment. Nasal washings and aspirates are unacceptable for Xpert Xpress SARS-CoV-2/FLU/RSV testing.   Fact Sheet for Patients: BloggerCourse.com   Fact Sheet for Healthcare Providers: SeriousBroker.it   This test is not yet approved or cleared by the [place]  FDA and has been authorized for detection and/or diagnosis of SARS-CoV-2 by FDA under an Emergency Use Authorization (EUA). This EUA will remain in effect (meaning this test can be used) for the duration of the COVID-19 declaration under Section 564(b)(1) of the Act, 21 U.S.C. section 360bbb-3(b)(1), unless the authorization is terminated or revoked.       Resp Syncytial Virus by PCR 12/08/2023 NEGATIVE  NEGATIVE Final    Comment: (NOTE) Fact Sheet for Patients: BloggerCourse.com   Fact Sheet for Healthcare Providers: SeriousBroker.it   This test is not yet approved or cleared by the [place]  FDA and has been authorized for detection and/or diagnosis of SARS-CoV-2 by FDA under an Emergency Use Authorization (EUA). This EUA will remain in effect (meaning this  test can be used) for the duration of the COVID-19 declaration under Section 564(b)(1) of the Act, 21 U.S.C. section 360bbb-3(b)(1), unless the authorization is terminated or revoked.   Performed  at [facility], [address] [place], [place] [address]     Neisseria Gonorrhea 12/08/2023 Positive (A)    Final   Chlamydia 12/08/2023 Negative    Final   Comment 12/08/2023 Normal Reference Ranger Chlamydia - Negative    Final   Comment 12/08/2023 Normal Reference Range Neisseria Gonorrhea - Negative    Final   Group A Strep by PCR 12/08/2023 DETECTED (A)  NOT DETECTED Final    Performed at [facility], [address], [place], [place] [address]   HIV Screen 4th Generation wRfx 12/08/2023 Non Reactive  Non Reactive Final    Performed at [facility], [address], [place], [place] [address]   Specimen Description 12/08/2023     Final                   Value:URINE, RANDOM Performed at [facility], [address] [place], [place] [address]     Special Requests 12/08/2023     Final                   Value:NONE Reflexed from [id number] Performed at [facility], [address], [place], [place] [address]     Culture 12/08/2023 >=100,000 COLONIES/mL ESCHERICHIA COLI (A)    Final   Report Status 12/08/2023 12/10/2023 FINAL    Final   Organism ID, Bacteria 12/08/2023 ESCHERICHIA COLI (A)    Final   Prothrombin Time 12/08/2023 15.7 (H)  11.4 - 15.2 seconds Final   INR 12/08/2023 1.2  0.8 - 1.2 Final    Comment: (NOTE) INR goal varies based on device and disease states. Performed at [facility], [address] [place], [place] [address]     Lactic Acid, Venous 12/08/2023 2.7 (HH)  0.5 - 1.9 mmol/L Final   Comment 12/08/2023 NOTIFIED PHYSICIAN    Final   BP 12/08/2023 118/46  mmHg Final   Single Plane A2C EF 12/08/2023 80.5  % Final   Single Plane A4C EF 12/08/2023 69.5  % Final   Calc  EF 12/08/2023 77.1  % Final   S' Lateral 12/08/2023 2.60  cm Final   AR max vel 12/08/2023 2.49  cm2 Final   AV Area VTI 12/08/2023 2.95  cm2 Final   AV Mean grad 12/08/2023 4.0  mmHg Final   AV Peak grad 12/08/2023 7.1  mmHg Final   Ao pk vel 12/08/2023 1.33  m/s Final   Area-P 1/2 12/08/2023 2.66  cm2 Final   AV Area mean vel 12/08/2023 2.48  cm2 Final   MV VTI 12/08/2023 3.39  cm2 Final   Est EF 12/08/2023 65 - 70%    Final   Lactic Acid, Venous 12/08/2023 1.9  0.5 - 1.9 mmol/L Final    Performed at [facility], [address], [place], [place] [address]   Sodium 12/09/2023 138  135 - 145 mmol/L Final   Potassium 12/09/2023 3.6  3.5 - 5.1 mmol/L Final   Chloride 12/09/2023 108  98 - 111 mmol/L Final   CO2 12/09/2023 25  22 - 32 mmol/L Final   Glucose, Bld 12/09/2023 98  70 - 99 mg/dL Final    Glucose reference range applies only to samples taken after fasting for at least 8 hours.   BUN 12/09/2023 15  6 - 20 mg/dL Final   Creatinine, Ser 12/09/2023 0.55  0.44 - 1.00 mg/dL Final   Calcium 44/10/4740 8.7 (L)  8.9 - 10.3 mg/dL Final   GFR, Estimated 12/09/2023 >60  >60 mL/min Final    Comment: (NOTE) Calculated using the CKD-EPI Creatinine Equation (2021)     Anion gap 12/09/2023 5  5 - 15 Final    Performed at [facility], [address] [place], [place] [address]   WBC 12/09/2023 10.7 (H)  4.0 - 10.5 K/uL Final   RBC 12/09/2023 4.31  3.87 - 5.11 MIL/uL Final   Hemoglobin 12/09/2023 13.0  12.0 - 15.0 g/dL Final   HCT 40/98/1191 40.6  36.0 - 46.0 % Final   MCV 12/09/2023 94.2  80.0 - 100.0 fL Final   MCH 12/09/2023 30.2  26.0 - 34.0 pg Final   MCHC 12/09/2023 32.0  30.0 - 36.0 g/dL Final   RDW 47/82/9562 13.2  11.5 - 15.5 % Final   Platelets 12/09/2023 263  150 - 400 K/uL Final   nRBC 12/09/2023 0.0  0.0 - 0.2 % Final    Performed at [facility], [address], [place], [place] [address]   WBC 12/10/2023 7.9  4.0 -  10.5 K/uL Final   RBC 12/10/2023 4.66  3.87 - 5.11 MIL/uL Final   Hemoglobin 12/10/2023 13.8  12.0 - 15.0 g/dL Final   HCT 57/84/6962 44.4  36.0 - 46.0 % Final   MCV 12/10/2023 95.3  80.0 - 100.0 fL Final   MCH 12/10/2023 29.6  26.0 - 34.0 pg Final   MCHC 12/10/2023 31.1  30.0 - 36.0 g/dL Final   RDW 95/28/4132 13.2  11.5 - 15.5 % Final   Platelets 12/10/2023 333  150 - 400 K/uL Final   nRBC 12/10/2023 0.0  0.0 - 0.2 % Final   Neutrophils Relative % 12/10/2023 46  % Final   Neutro Abs 12/10/2023 3.7  1.7 - 7.7 K/uL Final   Lymphocytes Relative 12/10/2023 32  % Final   Lymphs Abs 12/10/2023 2.5  0.7 - 4.0 K/uL Final   Monocytes Relative 12/10/2023 12  % Final   Monocytes Absolute 12/10/2023 0.9  0.1 - 1.0 K/uL Final   Eosinophils Relative 12/10/2023 7  % Final   Eosinophils Absolute 12/10/2023 0.5  0.0 - 0.5 K/uL Final   Basophils Relative 12/10/2023 2  % Final   Basophils Absolute 12/10/2023 0.1  0.0 - 0.1 K/uL Final   Smear Review 12/10/2023 Normal platelet morphology    Final   Immature Granulocytes 12/10/2023 1  % Final   Abs Immature Granulocytes 12/10/2023 0.06  0.00 - 0.07 K/uL Final   Reactive, Benign Lymphocytes 12/10/2023 PRESENT    Final    Performed at [facility], [address] [place], [place] [address]  Admission on 09/20/2023, Discharged on 09/21/2023  Component Date Value Ref Range Status    WBC 09/20/2023 20.3 (H)  4.0 - 10.5 K/uL Final   RBC 09/20/2023 5.41 (H)  3.87 - 5.11 MIL/uL Final   Hemoglobin 09/20/2023 16.6 (H)  12.0 - 15.0 g/dL Final   HCT 27/25/3664 51.1 (H)  36.0 - 46.0 % Final   MCV 09/20/2023 94.5  80.0 - 100.0 fL Final   MCH 09/20/2023 30.7  26.0 - 34.0 pg Final   MCHC 09/20/2023 32.5  30.0 - 36.0 g/dL Final   RDW 40/34/7425 12.9  11.5 - 15.5 % Final   Platelets 09/20/2023 289  150 - 400 K/uL Final   nRBC 09/20/2023 0.0  0.0 - 0.2 % Final   Neutrophils Relative % 09/20/2023 89  % Final  Neutro Abs 09/20/2023 18.2 (H)  1.7 -  7.7 K/uL Final   Lymphocytes Relative 09/20/2023 5  % Final   Lymphs Abs 09/20/2023 1.0  0.7 - 4.0 K/uL Final   Monocytes Relative 09/20/2023 4  % Final   Monocytes Absolute 09/20/2023 0.7  0.1 - 1.0 K/uL Final   Eosinophils Relative 09/20/2023 1  % Final   Eosinophils Absolute 09/20/2023 0.1  0.0 - 0.5 K/uL Final   Basophils Relative 09/20/2023 0  % Final   Basophils Absolute 09/20/2023 0.1  0.0 - 0.1 K/uL Final   Immature Granulocytes 09/20/2023 1  % Final   Abs Immature Granulocytes 09/20/2023 0.12 (H)  0.00 - 0.07 K/uL Final    Performed at [facility], [address] [place], [place] [address]   Sodium 09/20/2023 139  135 - 145 mmol/L Final   Potassium 09/20/2023 4.1  3.5 - 5.1 mmol/L Final   Chloride 09/20/2023 107  98 - 111 mmol/L Final   CO2 09/20/2023 19 (L)  22 - 32 mmol/L Final   Glucose, Bld 09/20/2023 140 (H)  70 - 99 mg/dL Final    Glucose reference range applies only to samples taken after fasting for at least 8 hours.   BUN 09/20/2023 15  6 - 20 mg/dL Final   Creatinine, Ser 09/20/2023 0.72  0.44 - 1.00 mg/dL Final   Calcium 78/46/9629 9.5  8.9 - 10.3 mg/dL Final   Total Protein 52/84/1324 7.9  6.5 - 8.1 g/dL Final   Albumin 40/05/2724 4.7  3.5 - 5.0 g/dL Final   AST 36/64/4034 20  15 - 41 U/L Final   ALT 09/20/2023 15  0 - 44 U/L Final   Alkaline Phosphatase 09/20/2023 49  38 - 126 U/L Final   Total Bilirubin 09/20/2023 0.7  0.0 - 1.2 mg/dL Final   GFR, Estimated 09/20/2023 >60  >60 mL/min Final    Comment: (NOTE) Calculated using the CKD-EPI Creatinine Equation (2021)     Anion gap 09/20/2023 13  5 - 15 Final    Performed at [facility], [address] [place], [place] [address]   Lipase 09/20/2023 29  11 - 51 U/L Final    Performed at [facility], [address], [place], [place] [address]      Allergies: Patient has no known allergies.   Medications:     Facility Ordered Medications  Medication   [COMPLETED] ketorolac  (TORADOL ) 15  MG/ML injection 15 mg   [COMPLETED] ondansetron  (ZOFRAN -ODT) disintegrating tablet 4 mg   [COMPLETED] loratadine (CLARITIN) tablet 10 mg   acetaminophen  (TYLENOL ) tablet 650 mg   alum & mag hydroxide-simeth (MAALOX/MYLANTA) 200-200-20 MG/5ML suspension 30 mL   magnesium hydroxide (MILK OF MAGNESIA) suspension 30 mL   haloperidol (HALDOL) tablet 5 mg    And   diphenhydrAMINE (BENADRYL) capsule 50 mg   haloperidol lactate (HALDOL) injection 5 mg    And   diphenhydrAMINE (BENADRYL) injection 50 mg    And   LORazepam (ATIVAN) injection 2 mg   haloperidol lactate (HALDOL) injection 10 mg    And   diphenhydrAMINE (BENADRYL) injection 50 mg    And   LORazepam (ATIVAN) injection 2 mg   hydrOXYzine (ATARAX) tablet 50 mg   traZODone  (DESYREL ) tablet 50 mg        PTA Medications  Medication Sig   methocarbamol  (ROBAXIN ) 750 MG tablet Take 1 tablet (750 mg total) by mouth 3 (three) times daily as needed (muscle spasm/pain).  Medical Decision Making  Lab Orders         CBC with Differential/Platelet         Comprehensive metabolic panel         TSH         Hepatitis panel, acute         Urinalysis, Routine w reflex microscopic -Urine, Clean Catch         POCT Urine Drug Screen - (I-Screen)         POC urine preg, ED      [facility] Discharge Disposition for Follow up and Recommendations: Based on my evaluation the patient does not appear to have an emergency medical condition and can be discharged with resources and follow up care in outpatient services for Medication Management and Individual Therapy  Diagnostic workup ordered, however not completed due to patient requesting to leave. Patient states "I have some things to do on the outside" and is requesting to be discharged. Patient has expressed a desire to leave the facility and is here voluntarily and is not endorsing suicidal or homicidal ideation intent or plan.  She is not endorsing AVH. COWS score is 5 indicating  subjective symptoms of mild withdrawal.  She demonstrates understanding of risk of leaving and is deemed to have decision-making capacity.  Discharge instructions included follow-up resources for outpatient substance use treatment.  Addie Holstein, PMHNP-BC, FNP-BC  01/26/2024, 3:55 PM

## 2024-01-26 NOTE — Discharge Instructions (Signed)
 I sent a prescription for Keflex in for you as well as Zofran  for nausea.  Please follow-up with the outpatient substance abuse referrals.  Please go to behavioral health urgent care for more acute behavioral or substance abuse concerns.

## 2024-01-26 NOTE — Discharge Instructions (Addendum)
 Discharge Recommendations:   Medications: Patient is to take medications as prescribed. The patient or patient's guardian is to contact a medical professional and/or outpatient provider to address any new side effects that develop. The patient or the patient's guardian should update outpatient providers of any new medications and/or medication changes.   Outpatient Follow up: Please review list of outpatient resources for psychiatry and counseling. Please follow up with your primary care provider for all medical related needs.   Please review outpatient resources for substance use treatment.   Therapy: We recommend that patient participate in individual therapy to address mental health concerns.  Safety:   The following safety precautions should be taken:   No sharp objects. This includes scissors, razors, scrapers, and putty knives.   Chemicals should be removed and locked up.   Medications should be removed and locked up.   Weapons should be removed and locked up. This includes firearms, knives and instruments that can be used to cause injury.   The patient should abstain from use of illicit substances/drugs and abuse of any medications.  If symptoms worsen or do not continue to improve or if the patient becomes actively suicidal or homicidal then it is recommended that the patient return to the closest hospital emergency department, the [facility], or call 911 for further evaluation and treatment.  National Suicide Prevention Lifeline 1-800-SUICIDE or 920-712-7949.  About 988 988 offers 24/7 access to trained crisis counselors who can help people experiencing mental health-related distress. People can call or text 988 or chat 988lifeline.org for themselves or if they are worried about a loved one who may need crisis support.

## 2024-01-26 NOTE — ED Triage Notes (Signed)
 Pt states that she is an addict and needs help. Pt uses crack and heroin. Last use last night.

## 2024-01-26 NOTE — ED Provider Notes (Signed)
 [place] EMERGENCY DEPARTMENT AT [facility] Provider Note   CSN: [id number] Arrival date & time: 01/26/24  1302     History  Chief Complaint  Patient presents with   Drug Problem    Morgan Donaldson is a 43 y.o. female with history of polysubstance abuse presents requesting detox.  Patient admits to heroin and cocaine use.  Last usage was this morning.  Was seen at [facility] just before this for similar complaint.  She was provided resources.  Without SI.  She describes itching, otherwise no other complaints.   Drug Problem   Past Medical History:  Diagnosis Date   Bronchitis    Hydrosalpinx    bilateral   Palpitations    Physiological ovarian cysts        Home Medications Prior to Admission medications   Medication Sig Start Date End Date Taking? Authorizing Provider  cephALEXin (KEFLEX) 500 MG capsule Take 1 capsule (500 mg total) by mouth 4 (four) times daily. 01/26/24   Geiple, Joshua, PA-C  methocarbamol  (ROBAXIN ) 750 MG tablet Take 1 tablet (750 mg total) by mouth 3 (three) times daily as needed (muscle spasm/pain). 11/29/23   Guadalupe Lee, MD  ondansetron  (ZOFRAN -ODT) 4 MG disintegrating tablet Take 1 tablet (4 mg total) by mouth every 8 (eight) hours as needed for nausea or vomiting. 01/26/24   Lyna Sandhoff, PA-C      Allergies    Patient has no known allergies.    Review of Systems   Review of Systems  All other systems reviewed and are negative.   Physical Exam Updated Vital Signs BP (!) 147/93 (BP Location: Right Arm)   Pulse 71   Temp 98.6 F (37 C)   Resp 18   SpO2 100%  Physical Exam Vitals and nursing note reviewed.  Constitutional:      General: She is not in acute distress.    Appearance: She is well-developed.  HENT:     Head: Normocephalic and atraumatic.  Eyes:     Conjunctiva/sclera: Conjunctivae normal.  Cardiovascular:     Rate and Rhythm: Normal rate and regular rhythm.     Heart sounds: No murmur  heard. Pulmonary:     Effort: Pulmonary effort is normal. No respiratory distress.     Breath sounds: Normal breath sounds.  Musculoskeletal:        General: No swelling.     Cervical back: Neck supple.  Skin:    General: Skin is warm and dry.     Capillary Refill: Capillary refill takes less than 2 seconds.     Comments: Patient does have numerous excoriations, without obvious rash or lesion  Neurological:     Mental Status: She is alert.  Psychiatric:        Mood and Affect: Mood normal.     ED Results / Procedures / Treatments   Labs (all labs ordered are listed, but only abnormal results are displayed) Labs Reviewed - No data to display  EKG None  Radiology No results found.  Procedures Procedures    Medications Ordered in ED Medications  loratadine (CLARITIN) tablet 10 mg (has no administration in time range)    ED Course/ Medical Decision Making/ A&P                                 Medical Decision Making  This patient presents to the ED with chief complaint(s) of detox.  The complaint involves an extensive differential diagnosis and also carries with it a high risk of complications and morbidity.   Pertinent past medical history as listed in HPI  Additional history obtained: Records reviewed Care Everywhere/External Records  Assessment and management:   Hemodynamically stable, nontoxic-appearing patient presenting requesting detox.  Admits to cocaine and heroin use.  Last use was was this morning.  She reports she feels itchy and she is requesting something for this.  On exam she has numerous excoriations without obvious rash or lesion.  No evidence of infection.  She is without SI.  Referred to [facility],  Independent ECG interpretation:  none  Independent labs interpretation:  The following labs were independently interpreted:  none  Independent visualization and interpretation of imaging: I independently visualized the following imaging with scope of  interpretation limited to determining acute life threatening conditions related to emergency care: none    Consultations obtained:   none  Disposition:   Patient will be discharged home. The patient has been appropriately medically screened and/or stabilized in the ED. I have low suspicion for any other emergent medical condition which would require further screening, evaluation or treatment in the ED or require inpatient management. At time of discharge the patient is hemodynamically stable and in no acute distress. I have discussed work-up results and diagnosis with patient and answered all questions. Patient is agreeable with discharge plan. We discussed strict return precautions for returning to the emergency department and they verbalized understanding.     Social Determinants of Health:   none  This note was dictated with voice recognition software.  Despite best efforts at proofreading, errors may have occurred which can change the documentation meaning.          Final Clinical Impression(s) / ED Diagnoses Final diagnoses:  Polysubstance abuse [facility])    Rx / DC Orders ED Discharge Orders     None         Felicie Horning, PA-C 01/26/24 1404    Jerilynn Montenegro, MD 01/27/24 1320

## 2024-01-26 NOTE — ED Notes (Signed)
 Patient discharged, AVS given and reviewed. No questions voiced at this time. Resources reviewed and contact information given for any future questions. Patient escorted out by staff to lobby, safety maintained.

## 2024-01-26 NOTE — ED Provider Notes (Signed)
 [place] EMERGENCY DEPARTMENT AT [facility] Provider Note   CSN: [id number] Arrival date & time: 01/26/24  1136     History  Chief Complaint  Patient presents with   Drug Problem    Morgan Donaldson is a 43 y.o. female.  Patient presents to the emergency department today for evaluation of concerns regarding substance abuse.  Patient has used both crack cocaine and heroin over the past 1 week.,  Last opiate use was yesterday.  She reports some mild withdrawals including body aches, nausea.  She states that she has been unable to get help with rehab in [place].  No fevers.  She has had hospitalization for murmur.  She shows me inflammation in the right antecubital area with track marks.       Home Medications Prior to Admission medications   Medication Sig Start Date End Date Taking? Authorizing Provider  cephALEXin (KEFLEX) 500 MG capsule Take 1 capsule (500 mg total) by mouth 4 (four) times daily. 01/26/24  Yes Lyna Sandhoff, PA-C  ondansetron  (ZOFRAN -ODT) 4 MG disintegrating tablet Take 1 tablet (4 mg total) by mouth every 8 (eight) hours as needed for nausea or vomiting. 01/26/24  Yes Noal Abshier, PA-C  methocarbamol  (ROBAXIN ) 750 MG tablet Take 1 tablet (750 mg total) by mouth 3 (three) times daily as needed (muscle spasm/pain). 11/29/23   Steinl, Kevin, MD      Allergies    Patient has no known allergies.    Review of Systems   Review of Systems  Physical Exam Updated Vital Signs BP (!) 152/77 (BP Location: Left Arm)   Pulse 78   Temp 99.2 F (37.3 C) (Oral)   Resp 16   Ht 5\' 7"  (1.702 m)   Wt 56 kg   SpO2 99%   BMI 19.34 kg/m  Physical Exam Vitals and nursing note reviewed.  Constitutional:      General: She is not in acute distress.    Appearance: She is well-developed.  HENT:     Head: Normocephalic and atraumatic.     Right Ear: External ear normal.     Left Ear: External ear normal.     Nose: Nose normal.  Eyes:      Conjunctiva/sclera: Conjunctivae normal.  Cardiovascular:     Rate and Rhythm: Normal rate and regular rhythm.     Heart sounds: No murmur heard.    Comments: No obvious murmur, no tachycardia. Pulmonary:     Effort: No respiratory distress.     Breath sounds: No wheezing, rhonchi or rales.  Abdominal:     Palpations: Abdomen is soft.     Tenderness: There is no abdominal tenderness. There is no guarding or rebound.  Musculoskeletal:     Cervical back: Normal range of motion and neck supple.     Right lower leg: No edema.     Left lower leg: No edema.  Skin:    General: Skin is warm and dry.     Findings: No rash.     Comments: Patient with phlebitis changes right antecubital fossa, no purulence or abscess noted.  Neurological:     General: No focal deficit present.     Mental Status: She is alert. Mental status is at baseline.     Motor: No weakness.  Psychiatric:     Comments: Mildly tearful     ED Results / Procedures / Treatments   Labs (all labs ordered are listed, but only abnormal results are displayed) Labs Reviewed -  No data to display  EKG None  Radiology No results found.  Procedures Procedures    Medications Ordered in ED Medications  ketorolac  (TORADOL ) 15 MG/ML injection 15 mg (has no administration in time range)  ondansetron  (ZOFRAN -ODT) disintegrating tablet 4 mg (has no administration in time range)    ED Course/ Medical Decision Making/ A&P    Patient seen and examined. History obtained directly from patient.  Discussed what I am able to do for her in the hospital versus not.  I will give treatment for mild withdrawal symptoms including IM Toradol  and ODT Zofran .  Will give prescription for ODT Zofran  as well as p.o. Keflex due to worsening redness in the right antecubital fossa however suspect that this is more phlebitis related than infectious --however patient is obviously extremely high risk.  Labs/EKG: None ordered  Imaging: None  ordered  Medications/Fluids: Ordered: Toradol  and Zofran   Most recent vital signs reviewed and are as follows: BP (!) 152/77 (BP Location: Left Arm)   Pulse 78   Temp 99.2 F (37.3 C) (Oral)   Resp 16   Ht 5\' 7"  (1.702 m)   Wt 56 kg   SpO2 99%   BMI 19.34 kg/m   Initial impression: Mild opiate withdrawal, denies alcohol use  Home treatment plan: Symptom control  Return instructions discussed with patient: Uncontrolled symptoms  Follow-up instructions discussed with patient: [facility], outpatient referrals.                                 Medical Decision Making Risk Prescription drug management.   Patient here requesting help with polysubstance abuse.  She seems to be stable at this time.  No fevers or infectious symptoms.  She has likely mild opiate withdrawal symptoms per her report, muscle aches and nausea.  No intractable vomiting.  Patient does have some signs of phlebitis without abscess or cellulitis in the right antecubital fossa, but will be given a course of Keflex as patient states that this has worsened a bit and she is high risk for cellulitis.  Encouraged outpatient follow-up, [facility] if needed.        Final Clinical Impression(s) / ED Diagnoses Final diagnoses:  Polysubstance abuse (HCC)  Phlebitis    Rx / DC Orders ED Discharge Orders          Ordered    cephALEXin (KEFLEX) 500 MG capsule  4 times daily        01/26/24 1221    ondansetron  (ZOFRAN -ODT) 4 MG disintegrating tablet  Every 8 hours PRN        01/26/24 1223              Pal Shell, PA-C 01/26/24 1227    Albertus Hughs, DO 01/26/24 1253

## 2024-01-26 NOTE — ED Triage Notes (Signed)
 Pt requesting help with heroin and crack addiction. Pt was seen at [facility] for the same and given resources.

## 2024-02-10 ENCOUNTER — Encounter (HOSPITAL_COMMUNITY): Payer: Self-pay

## 2024-02-10 ENCOUNTER — Other Ambulatory Visit: Payer: Self-pay

## 2024-02-10 ENCOUNTER — Inpatient Hospital Stay (HOSPITAL_COMMUNITY)
Admission: EM | Admit: 2024-02-10 | Discharge: 2024-02-12 | DRG: 759 | Discharge status: Against Medical Advice | Attending: Obstetrics and Gynecology | Admitting: Obstetrics and Gynecology

## 2024-02-10 ENCOUNTER — Emergency Department (HOSPITAL_COMMUNITY)

## 2024-02-10 DIAGNOSIS — R7401 Elevation of levels of liver transaminase levels: Secondary | ICD-10-CM

## 2024-02-10 DIAGNOSIS — Z801 Family history of malignant neoplasm of trachea, bronchus and lung: Secondary | ICD-10-CM | POA: Diagnosis not present

## 2024-02-10 DIAGNOSIS — R768 Other specified abnormal immunological findings in serum: Secondary | ICD-10-CM | POA: Diagnosis present

## 2024-02-10 DIAGNOSIS — N730 Acute parametritis and pelvic cellulitis: Principal | ICD-10-CM | POA: Diagnosis present

## 2024-02-10 DIAGNOSIS — Z5329 Procedure and treatment not carried out because of patient's decision for other reasons: Secondary | ICD-10-CM | POA: Diagnosis not present

## 2024-02-10 DIAGNOSIS — R309 Painful micturition, unspecified: Secondary | ICD-10-CM | POA: Diagnosis present

## 2024-02-10 DIAGNOSIS — Z5900 Homelessness unspecified: Secondary | ICD-10-CM | POA: Diagnosis not present

## 2024-02-10 DIAGNOSIS — Z8249 Family history of ischemic heart disease and other diseases of the circulatory system: Secondary | ICD-10-CM

## 2024-02-10 DIAGNOSIS — Z83438 Family history of other disorder of lipoprotein metabolism and other lipidemia: Secondary | ICD-10-CM | POA: Diagnosis not present

## 2024-02-10 DIAGNOSIS — Z825 Family history of asthma and other chronic lower respiratory diseases: Secondary | ICD-10-CM | POA: Diagnosis not present

## 2024-02-10 DIAGNOSIS — Z87891 Personal history of nicotine dependence: Secondary | ICD-10-CM | POA: Diagnosis not present

## 2024-02-10 DIAGNOSIS — N7093 Salpingitis and oophoritis, unspecified: Secondary | ICD-10-CM | POA: Diagnosis present

## 2024-02-10 DIAGNOSIS — R112 Nausea with vomiting, unspecified: Secondary | ICD-10-CM | POA: Diagnosis present

## 2024-02-10 DIAGNOSIS — K5900 Constipation, unspecified: Secondary | ICD-10-CM | POA: Diagnosis present

## 2024-02-10 DIAGNOSIS — R509 Fever, unspecified: Secondary | ICD-10-CM

## 2024-02-10 DIAGNOSIS — Z818 Family history of other mental and behavioral disorders: Secondary | ICD-10-CM | POA: Diagnosis not present

## 2024-02-10 DIAGNOSIS — B1920 Unspecified viral hepatitis C without hepatic coma: Secondary | ICD-10-CM | POA: Diagnosis present

## 2024-02-10 DIAGNOSIS — A549 Gonococcal infection, unspecified: Secondary | ICD-10-CM | POA: Diagnosis present

## 2024-02-10 LAB — WET PREP, GENITAL
Clue Cells Wet Prep HPF POC: NONE SEEN
Sperm: NONE SEEN
Trich, Wet Prep: NONE SEEN
WBC, Wet Prep HPF POC: >=10 — AB (ref <10)
Yeast Wet Prep HPF POC: NONE SEEN

## 2024-02-10 LAB — URINALYSIS, W/ REFLEX TO CULTURE (INFECTION SUSPECTED)
Bilirubin Urine: NEGATIVE
Glucose, UA: NEGATIVE mg/dL
Hgb urine dipstick: NEGATIVE
Ketones, ur: NEGATIVE mg/dL
Nitrite: POSITIVE — AB
Protein, ur: 30 mg/dL — AB
Specific Gravity, Urine: 1.015 (ref 1.005–1.030)
pH: 7 (ref 5.0–8.0)

## 2024-02-10 LAB — HEPATITIS PANEL, ACUTE
HCV Ab: REACTIVE — AB
Hep A IgM: NONREACTIVE
Hep B C IgM: NONREACTIVE
Hepatitis B Surface Ag: NONREACTIVE

## 2024-02-10 LAB — COMPREHENSIVE METABOLIC PANEL WITH GFR
ALT: 143 U/L — ABNORMAL HIGH (ref 0–44)
AST: 215 U/L — ABNORMAL HIGH (ref 15–41)
Albumin: 3.2 g/dL — ABNORMAL LOW (ref 3.5–5.0)
Alkaline Phosphatase: 82 U/L (ref 38–126)
Anion gap: 10 (ref 5–15)
BUN: 7 mg/dL (ref 6–20)
CO2: 25 mmol/L (ref 22–32)
Calcium: 8.7 mg/dL — ABNORMAL LOW (ref 8.9–10.3)
Chloride: 101 mmol/L (ref 98–111)
Creatinine, Ser: 0.66 mg/dL (ref 0.44–1.00)
GFR, Estimated: >60 mL/min (ref >60)
Glucose, Bld: 94 mg/dL (ref 70–99)
Potassium: 3.9 mmol/L (ref 3.5–5.1)
Sodium: 136 mmol/L (ref 135–145)
Total Bilirubin: 1 mg/dL (ref 0.0–1.2)
Total Protein: 6.8 g/dL (ref 6.5–8.1)

## 2024-02-10 LAB — CBC WITH DIFFERENTIAL/PLATELET
Abs Immature Granulocytes: 0.08 10*3/uL — ABNORMAL HIGH (ref 0.00–0.07)
Basophils Absolute: 0.1 10*3/uL (ref 0.0–0.1)
Basophils Relative: 0 %
Eosinophils Absolute: 0.1 10*3/uL (ref 0.0–0.5)
Eosinophils Relative: 1 %
HCT: 39.5 % (ref 36.0–46.0)
Hemoglobin: 12.7 g/dL (ref 12.0–15.0)
Immature Granulocytes: 1 %
Lymphocytes Relative: 9 %
Lymphs Abs: 1.6 10*3/uL (ref 0.7–4.0)
MCH: 28.5 pg (ref 26.0–34.0)
MCHC: 32.2 g/dL (ref 30.0–36.0)
MCV: 88.6 fL (ref 80.0–100.0)
Monocytes Absolute: 1 10*3/uL (ref 0.1–1.0)
Monocytes Relative: 6 %
Neutro Abs: 14 10*3/uL — ABNORMAL HIGH (ref 1.7–7.7)
Neutrophils Relative %: 83 %
Platelets: 331 10*3/uL (ref 150–400)
RBC: 4.46 MIL/uL (ref 3.87–5.11)
RDW: 14.8 % (ref 11.5–15.5)
WBC: 16.8 10*3/uL — ABNORMAL HIGH (ref 4.0–10.5)
nRBC: 0 % (ref 0.0–0.2)

## 2024-02-10 LAB — RPR: RPR Ser Ql: NONREACTIVE

## 2024-02-10 LAB — LIPASE, BLOOD: Lipase: 25 U/L (ref 11–51)

## 2024-02-10 LAB — PREGNANCY, URINE: Preg Test, Ur: NEGATIVE

## 2024-02-10 LAB — HIV ANTIBODY (ROUTINE TESTING W REFLEX): HIV Screen 4th Generation wRfx: NONREACTIVE

## 2024-02-10 MED ORDER — LACTATED RINGERS IV BOLUS
1000.0000 mL | Freq: Once | INTRAVENOUS | Status: AC
  Administered 2024-02-10: 1000 mL via INTRAVENOUS

## 2024-02-10 MED ORDER — SODIUM CHLORIDE 0.9 % IV SOLN
1.0000 g | INTRAVENOUS | Status: DC
  Administered 2024-02-12: 1 g via INTRAVENOUS
  Filled 2024-02-10 (×2): qty 10

## 2024-02-10 MED ORDER — HYDROCODONE-ACETAMINOPHEN 5-325 MG PO TABS
1.0000 | ORAL_TABLET | ORAL | Status: DC | PRN
  Filled 2024-02-10: qty 1

## 2024-02-10 MED ORDER — KETOROLAC TROMETHAMINE 30 MG/ML IJ SOLN
30.0000 mg | Freq: Four times a day (QID) | INTRAMUSCULAR | Status: DC
  Filled 2024-02-10: qty 1

## 2024-02-10 MED ORDER — ACETAMINOPHEN 500 MG PO TABS
500.0000 mg | ORAL_TABLET | Freq: Once | ORAL | Status: AC
  Administered 2024-02-10: 500 mg via ORAL
  Filled 2024-02-10: qty 1

## 2024-02-10 MED ORDER — METRONIDAZOLE 500 MG/100ML IV SOLN
500.0000 mg | Freq: Once | INTRAVENOUS | Status: AC
  Administered 2024-02-10: 500 mg via INTRAVENOUS
  Filled 2024-02-10: qty 100

## 2024-02-10 MED ORDER — IOHEXOL 350 MG/ML SOLN
75.0000 mL | Freq: Once | INTRAVENOUS | Status: AC | PRN
  Administered 2024-02-10: 75 mL via INTRAVENOUS

## 2024-02-10 MED ORDER — SODIUM CHLORIDE 0.9 % IV SOLN
25.0000 mg | Freq: Once | INTRAVENOUS | Status: AC
  Administered 2024-02-10: 25 mg via INTRAVENOUS
  Filled 2024-02-10: qty 1

## 2024-02-10 MED ORDER — ONDANSETRON HCL 4 MG PO TABS: 4.0000 mg | ORAL_TABLET | Freq: Four times a day (QID) | ORAL | Status: DC | PRN

## 2024-02-10 MED ORDER — KETOROLAC TROMETHAMINE 30 MG/ML IJ SOLN
30.0000 mg | Freq: Four times a day (QID) | INTRAMUSCULAR | Status: AC
Start: 2024-02-10 — End: 2024-02-15
  Administered 2024-02-10 – 2024-02-12 (×5): 30 mg via INTRAVENOUS
  Filled 2024-02-10 (×8): qty 1

## 2024-02-10 MED ORDER — ZOLPIDEM TARTRATE 5 MG PO TABS: 5.0000 mg | ORAL_TABLET | Freq: Every evening | ORAL | Status: DC | PRN

## 2024-02-10 MED ORDER — METRONIDAZOLE 500 MG/100ML IV SOLN
500.0000 mg | Freq: Two times a day (BID) | INTRAVENOUS | Status: DC
  Administered 2024-02-10: 500 mg via INTRAVENOUS
  Filled 2024-02-10 (×3): qty 100

## 2024-02-10 MED ORDER — SODIUM CHLORIDE 0.9 % IV SOLN
100.0000 mg | Freq: Once | INTRAVENOUS | Status: AC
  Administered 2024-02-10: 100 mg via INTRAVENOUS
  Filled 2024-02-10: qty 100

## 2024-02-10 MED ORDER — SODIUM CHLORIDE 0.9 % IV SOLN
100.0000 mg | Freq: Two times a day (BID) | INTRAVENOUS | Status: DC
  Administered 2024-02-10: 100 mg via INTRAVENOUS
  Filled 2024-02-10 (×4): qty 100

## 2024-02-10 MED ORDER — LACTATED RINGERS IV SOLN: INTRAVENOUS | Status: AC

## 2024-02-10 MED ORDER — ONDANSETRON HCL 4 MG/2ML IJ SOLN: 4.0000 mg | Freq: Four times a day (QID) | INTRAMUSCULAR | Status: DC | PRN

## 2024-02-10 MED ORDER — SODIUM CHLORIDE 0.9 % IV SOLN
1.0000 g | Freq: Once | INTRAVENOUS | Status: AC
  Administered 2024-02-10: 1 g via INTRAVENOUS
  Filled 2024-02-10: qty 10

## 2024-02-10 MED ORDER — KETOROLAC TROMETHAMINE 30 MG/ML IJ SOLN
30.0000 mg | Freq: Once | INTRAMUSCULAR | Status: AC
  Administered 2024-02-10: 30 mg via INTRAVENOUS
  Filled 2024-02-10: qty 1

## 2024-02-10 NOTE — Plan of Care (Signed)
°  Problem: Activity: °Goal: Risk for activity intolerance will decrease °Outcome: Progressing °  °Problem: Nutrition: °Goal: Adequate nutrition will be maintained °Outcome: Progressing °  °Problem: Skin Integrity: °Goal: Risk for impaired skin integrity will decrease °Outcome: Progressing °  °

## 2024-02-10 NOTE — ED Provider Notes (Signed)
 [place] EMERGENCY DEPARTMENT AT [facility] Provider Note   CSN: [id number] Arrival date & time: 02/10/24  0730     Patient presents with: No chief complaint on file.   DARYL QUIROS is a 43 y.o. female.  {Add pertinent medical, surgical, social history, OB history to HPI:32947} HPI      43 year old female with a history of IV drug use, admission in April with concern for sepsis, who presents with concern for abdominal pain and fever.   11PM began to have lower abdominal pain, in middle, cramping, severe Nausea and vomiting x1 Constipation, yesterday evening last BM Vaginal discharge x3 days, sometimes using protection, does not with boyfriend sometimes. Has had similar symptoms with gonorrhea before Fever 101 today Sort of pain with urination  Not using IVDU anymore. Is using crack.   Past Medical History:  Diagnosis Date   Bronchitis    Hydrosalpinx    bilateral   Palpitations    Physiological ovarian cysts     Past Surgical History:  Procedure Laterality Date   ORIF PELVIC FRACTURE      Prior to Admission medications   Medication Sig Start Date End Date Taking? Authorizing Provider  cephALEXin  (KEFLEX ) 500 MG capsule Take 1 capsule (500 mg total) by mouth 4 (four) times daily. 01/26/24   Lyna Sandhoff, PA-C  ondansetron  (ZOFRAN -ODT) 4 MG disintegrating tablet Take 1 tablet (4 mg total) by mouth every 8 (eight) hours as needed for nausea or vomiting. 01/26/24   Lyna Sandhoff, PA-C    Allergies: Patient has no known allergies.    Review of Systems  Updated Vital Signs There were no vitals taken for this visit.  Physical Exam Vitals and nursing note reviewed.  Constitutional:      General: She is not in acute distress.    Appearance: She is well-developed. She is not diaphoretic.  HENT:     Head: Normocephalic and atraumatic.   Eyes:     Conjunctiva/sclera: Conjunctivae normal.    Cardiovascular:     Rate and Rhythm: Normal rate and  regular rhythm.     Heart sounds: Normal heart sounds. No murmur heard.    No friction rub. No gallop.  Pulmonary:     Effort: Pulmonary effort is normal. No respiratory distress.     Breath sounds: Normal breath sounds. No wheezing or rales.  Abdominal:     General: There is no distension.     Palpations: Abdomen is soft.     Tenderness: There is no abdominal tenderness. There is no guarding.   Musculoskeletal:        General: No tenderness.     Cervical back: Normal range of motion.   Skin:    General: Skin is warm and dry.     Findings: No erythema or rash.   Neurological:     Mental Status: She is alert and oriented to person, place, and time.     (all labs ordered are listed, but only abnormal results are displayed) Labs Reviewed - No data to display  EKG: None  Radiology: No results found.  {Document cardiac monitor, telemetry assessment procedure when appropriate:32947} Procedures   Medications Ordered in the ED - No data to display    {Click here for ABCD2, HEART and other calculators REFRESH Note before signing:1}                               43 year old female  with a history of IV drug use, admission in April with concern for sepsis, who presents with concern for abdominal pain and fever.  DDx includes appendicitis, pancreatitis, cholecystitis, pyelonephritis, nephrolithiasis, diverticulitis, PID, ovarian torsion, ectopic pregnancy, and tuboovarian abscess.  Given fever, hx of prior IVDU, will obtain blood cultures.  Labs obtained and evaluated by me show ***  CT abdomen pelvis completed showing ***    {Document critical care time when appropriate  Document review of labs and clinical decision tools ie CHADS2VASC2, etc  Document your independent review of radiology images and any outside records  Document your discussion with family members, caretakers and with consultants  Document social determinants of health affecting pt's care  Document your  decision making why or why not admission, treatments were needed:32947:::1}   Final diagnoses:  None    ED Discharge Orders     None

## 2024-02-10 NOTE — Plan of Care (Signed)
  Problem: Pain Managment: Goal: General experience of comfort will improve and/or be controlled Outcome: Progressing   Problem: Safety: Goal: Ability to remain free from injury will improve Outcome: Progressing

## 2024-02-10 NOTE — ED Triage Notes (Signed)
 Pt BIB [place] with c/o lower abdominal pain that started last night

## 2024-02-10 NOTE — H&P (Signed)
 Morgan Donaldson is an 43 y.o. female. G0P0000  with a history of IV drug use, admission in April with concern for sepsis, who presents with concern for abdominal pain and fever. 11PM began to have lower abdominal pain, in middle, cramping, severe Nausea and vomiting x1 Constipation, yesterday evening last BM Vaginal discharge x3 days, sometimes using protection, does not with boyfriend sometimes. Has had similar symptoms with gonorrhea before Fever 101 today. She states she last used drugs about 2 weeks ago   Pertinent Gynecological History:  Contraception: Depo-Provera  injections  Sexually transmitted diseases: past history: GC Previous GYN Procedures: colpsocopy 2022 LSIL  Last pap: abnormal: LSIL Date: 2022    Menstrual History:  No LMP recorded. Patient has had an injection.    Past Medical History:  Diagnosis Date   Bronchitis    Hydrosalpinx    bilateral   Palpitations    Physiological ovarian cysts     Past Surgical History:  Procedure Laterality Date   ORIF PELVIC FRACTURE      Family History  Problem Relation Age of Onset   Lung cancer Mother        died at age 83   CAD Mother        MI at age 77   Hypertension Mother    Schizophrenia Father    Hypertension Sister    Hyperlipidemia Sister    COPD Sister    Healthy Brother    Healthy Sister     Social History:  reports that she quit smoking about 4 years ago. Her smoking use included cigarettes. She has never used smokeless tobacco. She reports current drug use. Drug: Marijuana. She reports that she does not drink alcohol.  Allergies: No Known Allergies  (Not in a hospital admission)   Review of Systems  Constitutional:  Positive for fatigue.  Gastrointestinal:  Positive for constipation and nausea.  Genitourinary:  Positive for dysuria, pelvic pain and vaginal discharge. Negative for menstrual problem.    Blood pressure (!) 108/56, pulse 71, temperature 98.5 F (36.9 C), temperature source Oral,  resp. rate 16, SpO2 100%. Physical Exam Vitals and nursing note reviewed. Exam conducted with a chaperone present.  Constitutional:      Comments: Sleeping but arousable  HENT:     Head: Normocephalic and atraumatic.   Cardiovascular:     Rate and Rhythm: Normal rate.  Pulmonary:     Effort: Pulmonary effort is normal.  Abdominal:     General: Abdomen is flat.     Tenderness: There is abdominal tenderness. There is guarding.   Musculoskeletal:     Cervical back: Normal range of motion.   Skin:    General: Skin is warm and dry.   Psychiatric:        Mood and Affect: Mood normal.        Behavior: Behavior normal.     Results for orders placed or performed during the hospital encounter of 02/10/24 (from the past 24 hours)  Urinalysis, w/ Reflex to Culture (Infection Suspected) -Urine, Clean Catch     Status: Abnormal   Collection Time: 02/10/24  7:47 AM  Result Value Ref Range   Specimen Source URINE, CLEAN CATCH    Color, Urine YELLOW YELLOW   APPearance CLOUDY (A) CLEAR   Specific Gravity, Urine 1.015 1.005 - 1.030   pH 7.0 5.0 - 8.0   Glucose, UA NEGATIVE NEGATIVE mg/dL   Hgb urine dipstick NEGATIVE NEGATIVE   Bilirubin Urine NEGATIVE NEGATIVE   Ketones,  ur NEGATIVE NEGATIVE mg/dL   Protein, ur 30 (A) NEGATIVE mg/dL   Nitrite POSITIVE (A) NEGATIVE   Leukocytes,Ua MODERATE (A) NEGATIVE   RBC / HPF 0-5 0 - 5 RBC/hpf   WBC, UA 21-50 0 - 5 WBC/hpf   Bacteria, UA FEW (A) NONE SEEN   Squamous Epithelial / HPF 0-5 0 - 5 /HPF   Mucus PRESENT    Amorphous Crystal PRESENT   Pregnancy, urine     Status: None   Collection Time: 02/10/24  7:47 AM  Result Value Ref Range   Preg Test, Ur NEGATIVE NEGATIVE  Wet prep, genital     Status: Abnormal   Collection Time: 02/10/24  7:50 AM   Specimen: PATH Cytology Cervicovaginal Ancillary Only  Result Value Ref Range   Yeast Wet Prep HPF POC NONE SEEN NONE SEEN   Trich, Wet Prep NONE SEEN NONE SEEN   Clue Cells Wet Prep HPF POC  NONE SEEN NONE SEEN   WBC, Wet Prep HPF POC >=10 (A) <10   Sperm NONE SEEN   CBC with Differential     Status: Abnormal   Collection Time: 02/10/24  8:10 AM  Result Value Ref Range   WBC 16.8 (H) 4.0 - 10.5 K/uL   RBC 4.46 3.87 - 5.11 MIL/uL   Hemoglobin 12.7 12.0 - 15.0 g/dL   HCT 32.4 40.1 - 02.7 %   MCV 88.6 80.0 - 100.0 fL   MCH 28.5 26.0 - 34.0 pg   MCHC 32.2 30.0 - 36.0 g/dL   RDW 25.3 66.4 - 40.3 %   Platelets 331 150 - 400 K/uL   nRBC 0.0 0.0 - 0.2 %   Neutrophils Relative % 83 %   Neutro Abs 14.0 (H) 1.7 - 7.7 K/uL   Lymphocytes Relative 9 %   Lymphs Abs 1.6 0.7 - 4.0 K/uL   Monocytes Relative 6 %   Monocytes Absolute 1.0 0.1 - 1.0 K/uL   Eosinophils Relative 1 %   Eosinophils Absolute 0.1 0.0 - 0.5 K/uL   Basophils Relative 0 %   Basophils Absolute 0.1 0.0 - 0.1 K/uL   Immature Granulocytes 1 %   Abs Immature Granulocytes 0.08 (H) 0.00 - 0.07 K/uL  Comprehensive metabolic panel     Status: Abnormal   Collection Time: 02/10/24  8:10 AM  Result Value Ref Range   Sodium 136 135 - 145 mmol/L   Potassium 3.9 3.5 - 5.1 mmol/L   Chloride 101 98 - 111 mmol/L   CO2 25 22 - 32 mmol/L   Glucose, Bld 94 70 - 99 mg/dL   BUN 7 6 - 20 mg/dL   Creatinine, Ser 4.74 0.44 - 1.00 mg/dL   Calcium 8.7 (L) 8.9 - 10.3 mg/dL   Total Protein 6.8 6.5 - 8.1 g/dL   Albumin 3.2 (L) 3.5 - 5.0 g/dL   AST 259 (H) 15 - 41 U/L   ALT 143 (H) 0 - 44 U/L   Alkaline Phosphatase 82 38 - 126 U/L   Total Bilirubin 1.0 0.0 - 1.2 mg/dL   GFR, Estimated >56 >38 mL/min   Anion gap 10 5 - 15  Lipase, blood     Status: None   Collection Time: 02/10/24  8:10 AM  Result Value Ref Range   Lipase 25 11 - 51 U/L  HIV Antibody (routine testing w rflx)     Status: None   Collection Time: 02/10/24  8:10 AM  Result Value Ref Range   HIV Screen 4th  Generation wRfx Non Reactive Non Reactive  RPR     Status: None   Collection Time: 02/10/24  8:10 AM  Result Value Ref Range   RPR Ser Ql NON REACTIVE NON  REACTIVE    CT ABDOMEN PELVIS W CONTRAST Result Date: 02/10/2024 CLINICAL DATA:  Acute lower abdominal pain beginning yesterday. EXAM: CT ABDOMEN AND PELVIS WITH CONTRAST TECHNIQUE: Multidetector CT imaging of the abdomen and pelvis was performed using the standard protocol following bolus administration of intravenous contrast. RADIATION DOSE REDUCTION: This exam was performed according to the departmental dose-optimization program which includes automated exposure control, adjustment of the mA and/or kV according to patient size and/or use of iterative reconstruction technique. CONTRAST:  75mL OMNIPAQUE  IOHEXOL  350 MG/ML SOLN COMPARISON:  12/08/2023 FINDINGS: Lower Chest: No acute findings. Hepatobiliary: No suspicious hepatic masses identified. Gallbladder is unremarkable. No evidence of biliary ductal dilatation. Pancreas:  No mass or inflammatory changes. Spleen: Within normal limits in size and appearance. Adrenals/Urinary Tract: No suspicious masses identified. No evidence of ureteral calculi or hydronephrosis. Stomach/Bowel: No evidence of obstruction, inflammatory process or abnormal fluid collections. Vascular/Lymphatic: No pathologically enlarged lymph nodes. No acute vascular findings. Reproductive: Uterus is normal appearance. Moderate left and mild right hydrosalpinges are seen which show mural enhancement. Small amount of pelvic free fluid is seen with peritoneal enhancement noted in the cul-de-sac. These findings are highly suspicious for pelvic inflammatory disease with bilateral bilateral pyo-salpinges. Other:  None. Musculoskeletal: No suspicious bone lesions identified. Surgical hardware seen across the pubic symphysis and bilateral sacroiliac joints with old fracture deformities. 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 IMPRESSION: Findings consistent with pelvic inflammatory disease with bilateral pyo-salpinges. Electronically Signed   By: Marlyce Sine  M.D.   On: 02/10/2024 13:07    Assessment/Plan: Sx and findings c/w PID. Admit for IV antibiotics and pain management  Onnie Bilis 02/10/2024, 2:14 PM

## 2024-02-11 ENCOUNTER — Inpatient Hospital Stay (HOSPITAL_COMMUNITY)

## 2024-02-11 DIAGNOSIS — N730 Acute parametritis and pelvic cellulitis: Principal | ICD-10-CM

## 2024-02-11 DIAGNOSIS — R768 Other specified abnormal immunological findings in serum: Secondary | ICD-10-CM | POA: Diagnosis present

## 2024-02-11 DIAGNOSIS — R509 Fever, unspecified: Secondary | ICD-10-CM

## 2024-02-11 DIAGNOSIS — Z5329 Procedure and treatment not carried out because of patient's decision for other reasons: Secondary | ICD-10-CM

## 2024-02-11 LAB — COMPREHENSIVE METABOLIC PANEL WITH GFR
ALT: 95 U/L — ABNORMAL HIGH (ref 0–44)
AST: 109 U/L — ABNORMAL HIGH (ref 15–41)
Albumin: 2.3 g/dL — ABNORMAL LOW (ref 3.5–5.0)
Alkaline Phosphatase: 72 U/L (ref 38–126)
Anion gap: 8 (ref 5–15)
BUN: 9 mg/dL (ref 6–20)
CO2: 23 mmol/L (ref 22–32)
Calcium: 7.9 mg/dL — ABNORMAL LOW (ref 8.9–10.3)
Chloride: 103 mmol/L (ref 98–111)
Creatinine, Ser: 0.76 mg/dL (ref 0.44–1.00)
GFR, Estimated: >60 mL/min (ref >60)
Glucose, Bld: 245 mg/dL — ABNORMAL HIGH (ref 70–99)
Potassium: 4.1 mmol/L (ref 3.5–5.1)
Sodium: 134 mmol/L — ABNORMAL LOW (ref 135–145)
Total Bilirubin: 0.7 mg/dL (ref 0.0–1.2)
Total Protein: 5.7 g/dL — ABNORMAL LOW (ref 6.5–8.1)

## 2024-02-11 LAB — CBC
HCT: 38.1 % (ref 36.0–46.0)
Hemoglobin: 12.4 g/dL (ref 12.0–15.0)
MCH: 28.7 pg (ref 26.0–34.0)
MCHC: 32.5 g/dL (ref 30.0–36.0)
MCV: 88.2 fL (ref 80.0–100.0)
Platelets: 328 10*3/uL (ref 150–400)
RBC: 4.32 MIL/uL (ref 3.87–5.11)
RDW: 14.9 % (ref 11.5–15.5)
WBC: 17.9 10*3/uL — ABNORMAL HIGH (ref 4.0–10.5)
nRBC: 0 % (ref 0.0–0.2)

## 2024-02-11 LAB — URINE CULTURE

## 2024-02-11 LAB — GC/CHLAMYDIA PROBE AMP (~~LOC~~) NOT AT ARMC
Chlamydia: NEGATIVE
Comment: NEGATIVE
Comment: NORMAL
Neisseria Gonorrhea: POSITIVE — AB

## 2024-02-11 LAB — APTT: aPTT: 37 s — ABNORMAL HIGH (ref 24–36)

## 2024-02-11 LAB — FIBRINOGEN: Fibrinogen: 524 mg/dL — ABNORMAL HIGH (ref 210–475)

## 2024-02-11 LAB — PROTIME-INR
INR: 1.2 (ref 0.8–1.2)
Prothrombin Time: 15.4 s — ABNORMAL HIGH (ref 11.4–15.2)

## 2024-02-11 LAB — TSH: TSH: 0.373 u[IU]/mL (ref 0.350–4.500)

## 2024-02-11 MED ORDER — DEXTROSE IN LACTATED RINGERS 5 % IV SOLN: INTRAVENOUS | Status: DC

## 2024-02-11 MED ORDER — OXYCODONE HCL 5 MG PO TABS: 5.0000 mg | ORAL_TABLET | Freq: Four times a day (QID) | ORAL | Status: DC | PRN

## 2024-02-11 MED ORDER — ENOXAPARIN SODIUM 40 MG/0.4ML IJ SOSY
40.0000 mg | PREFILLED_SYRINGE | Freq: Every day | INTRAMUSCULAR | Status: DC
  Administered 2024-02-12: 40 mg via SUBCUTANEOUS
  Filled 2024-02-11 (×2): qty 0.4

## 2024-02-11 NOTE — Progress Notes (Incomplete Revision)
 Gynecology Progress Note  Admission Date: 02/10/2024 Current Date: 02/11/2024 1:48 PM  Morgan Donaldson is a 43 y.o. G0P0000 admitted for fevers, pain, PID, b/l hydrosalpinges concerning for pyosalpinges   History complicated by: Patient Active Problem List   Diagnosis Date Noted   Hepatitis C antibody detected 02/11/2024   PID (acute pelvic inflammatory disease) 02/10/2024   Gonorrhea 12/10/2023   Group A streptococcal infection 12/09/2023   IV drug abuse (HCC) 12/09/2023   Acute cystitis 12/09/2023   GAD (generalized anxiety disorder) 06/21/2020   Attention deficit hyperactivity disorder (ADHD), 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    ROS and patient/family/surgical history, located on admission H&P note dated 02/10/2024, have been reviewed, and there are no changes except as noted below Yesterday/Overnight Events:  none  Subjective:  Patient resting comfortably, tired.   Objective:    Current Vital Signs 24h Vital Sign Ranges  T 99.1 F (37.3 C) Temp  Avg: 98.7 F (37.1 C)  Min: 97.9 F (36.6 C)  Max: 99.8 F (37.7 C)  BP (!) 151/82 BP  Min: 105/57  Max: 151/82  HR 68 Pulse  Avg: 68.7  Min: 62  Max: 84  RR 18 Resp  Avg: 17.3  Min: 16  Max: 18  SaO2 99 % Room Air SpO2  Avg: 98.7 %  Min: 97 %  Max: 100 %       24 Hour I/O Current Shift I/O  Time Ins Outs 06/15 0701 - 06/16 0700 In: 3568.2 [P.O.:480; I.V.:1239.8] Out: -  06/16 0701 - 06/16 1900 In: 480 [P.O.:480] Out: -    Patient Vitals for the past 24 hrs:  BP Temp Temp src Pulse Resp SpO2 Height Weight  02/11/24 0736 (!) 151/82 99.1 F (37.3 C) Oral 68 18 99 % -- --  02/11/24 0503 (!) 129/52 97.9 F (36.6 C) Oral 62 18 100 % -- --  02/11/24 0037 131/69 99.8 F (37.7  C) Oral 84 18 97 % -- --  02/10/24 1959 (!) 106/51 99.2 F (37.3 C) Oral 74 18 99 % -- --  02/10/24 1704 (!) 105/57 98.1 F (36.7 C) Oral 62 16 -- 5' 7 (1.702 m) 56 kg  02/10/24 1622 (!) 105/57 98.1 F (36.7 C) Oral -- 16 97 % -- --  02/10/24 1435 -- 98.7 F (37.1 C) Oral -- -- -- -- --  02/10/24 1415 109/64 -- -- 62 17 100 % -- --    Physical exam: General appearance: appears older than stated age, no distress, and lethargic Abdomen: deferred GU: deferred Lungs: breathing comfortably Heart: S1, S2 normal, no murmur, rub or gallop, regular rate and rhythm Extremities: no lower extremity edema Skin: warm and dry Psych: appropriate  Medications Current Facility-Administered Medications  Medication Dose Route Frequency Provider Last Rate Last Admin   cefTRIAXone  (ROCEPHIN ) 1 g in sodium chloride  0.9 % 100 mL IVPB  1 g Intravenous Q24H Arnold, James G, MD 200 mL/hr at 02/11/24 0802 1 g at 02/11/24 0802   doxycycline  (VIBRAMYCIN ) 100 mg in sodium chloride  0.9 % 250 mL IVPB  100 mg Intravenous Q12H Tresia Fruit, MD 125 mL/hr at 02/11/24 1212 100 mg at 02/11/24 1212   enoxaparin  (LOVENOX ) injection 40 mg  40 mg Subcutaneous Daily Raynell Caller, MD  40 mg at 02/11/24 1204   HYDROcodone -acetaminophen  (NORCO/VICODIN) 5-325 MG per tablet 1-2 tablet  1-2 tablet Oral Q4H PRN Tresia Fruit, MD       ketorolac  (TORADOL ) 30 MG/ML injection 30 mg  30 mg Intravenous Q6H Tresia Fruit, MD   30 mg at 02/11/24 1203   Or   ketorolac  (TORADOL ) 30 MG/ML injection 30 mg  30 mg Intramuscular Q6H Tresia Fruit, MD       lactated ringers  infusion   Intravenous Continuous Tresia Fruit, MD 100 mL/hr at 02/11/24 0037 New Bag at 02/11/24 0037   metroNIDAZOLE  (FLAGYL ) IVPB 500 mg  500 mg Intravenous Q12H Tresia Fruit, MD 100 mL/hr at 02/11/24 1011 500 mg at 02/11/24 1011   ondansetron  (ZOFRAN ) tablet 4 mg  4 mg Oral Q6H PRN Tresia Fruit, MD       Or   ondansetron  (ZOFRAN ) injection 4 mg  4  mg Intravenous Q6H PRN Tresia Fruit, MD       oxyCODONE  (Oxy IR/ROXICODONE ) immediate release tablet 5-10 mg  5-10 mg Oral Q6H PRN Raynell Caller, MD       zolpidem (AMBIEN) tablet 5 mg  5 mg Oral QHS PRN Tresia Fruit, MD       Labs  Recent Labs  Lab 02/10/24 [phone] 02/11/24 0952  WBC 16.8* 17.9*  HGB 12.7 12.4  HCT 39.5 38.1  PLT 331 328    Recent Labs  Lab 02/10/24 0810 02/11/24 0952  NA 136 134*  K 3.9 4.1  CL 101 103  CO2 25 23  BUN 7 9  CREATININE 0.66 0.76  CALCIUM 8.7* 7.9*  PROT 6.8 5.7*  BILITOT 1.0 0.7  ALKPHOS 82 72  ALT 143* 95*  AST 215* 109*  GLUCOSE 94 245*    Radiology No new imaging  Assessment & Plan:  Patient stable *GYN: Radiology called to clarify sizes on CT scan of what was seen in the adnexae. D/w patient will get pelvic u/s. Continue IV abx; can see about transitioning to PO tomorrow. Follow up re: if wants to continue depo provera  -Last temp: 6/15 at 0730 38.3 *Abnormal pap: needs repeat and hpv testing in outpatient setting *Elevated glucose: f/u A1c, additional glucose values *HepC: appears to be new diagnosis. F/u additional labwork and f/u patient tomorrow re: if this is a new diagnosis *Pain: controlled with current PRNs *FEN/GI: ADAT to regular. Will do MIVF until taking better PO *PPx: lovenox  ordered and given at noon since not wearing SCDs *Dispo: pending clinical improvement  Code Status: Full Code  Total time taking care of the patient was 35 minutes, with greater than 50% of the time spent in face to face interaction with the patient.  Tyler Gallant MD Attending Center for [facility])

## 2024-02-11 NOTE — Plan of Care (Signed)
   Problem: Activity: Goal: Risk for activity intolerance will decrease Outcome: Progressing   Problem: Nutrition: Goal: Adequate nutrition will be maintained Outcome: Progressing   Problem: Coping: Goal: Level of anxiety will decrease Outcome: Progressing   Problem: Pain Managment: Goal: General experience of comfort will improve and/or be controlled Outcome: Progressing

## 2024-02-11 NOTE — Progress Notes (Addendum)
 Gynecology Progress Note  Admission Date: 02/10/2024 Current Date: 02/11/2024 1:48 PM  Morgan Donaldson is a 43 y.o. G0P0000 admitted for fevers, pain, PID, b/l hydrosalpinges concerning for pyosalpinges   History complicated by: Patient Active Problem List   Diagnosis Date Noted   Hepatitis C antibody detected 02/11/2024   PID (acute pelvic inflammatory disease) 02/10/2024   Gonorrhea 12/10/2023   Group A streptococcal infection 12/09/2023   IV drug abuse (HCC) 12/09/2023   Acute cystitis 12/09/2023   GAD (generalized anxiety disorder) 06/21/2020   Attention deficit hyperactivity disorder (ADHD), 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    ROS and patient/family/surgical history, located on admission H&P note dated 02/10/2024, have been reviewed, and there are no changes except as noted below Yesterday/Overnight Events:  none  Subjective:  Patient resting comfortably, tired.   Objective:    Current Vital Signs 24h Vital Sign Ranges  T 99.1 F (37.3 C) Temp  Avg: 98.7 F (37.1 C)  Min: 97.9 F (36.6 C)  Max: 99.8 F (37.7 C)  BP (!) 151/82 BP  Min: 105/57  Max: 151/82  HR 68 Pulse  Avg: 68.7  Min: 62  Max: 84  RR 18 Resp  Avg: 17.3  Min: 16  Max: 18  SaO2 99 % Room Air SpO2  Avg: 98.7 %  Min: 97 %  Max: 100 %       24 Hour I/O Current Shift I/O  Time Ins Outs 06/15 0701 - 06/16 0700 In: 3568.2 [P.O.:480; I.V.:1239.8] Out: -  06/16 0701 - 06/16 1900 In: 480 [P.O.:480] Out: -    Patient Vitals for the past 24 hrs:  BP Temp Temp src Pulse Resp SpO2 Height Weight  02/11/24 0736 (!) 151/82 99.1 F (37.3 C) Oral 68 18 99 % -- --  02/11/24 0503 (!) 129/52 97.9 F (36.6 C) Oral 62 18 100 % -- --  02/11/24 0037 131/69 99.8 F (37.7  C) Oral 84 18 97 % -- --  02/10/24 1959 (!) 106/51 99.2 F (37.3 C) Oral 74 18 99 % -- --  02/10/24 1704 (!) 105/57 98.1 F (36.7 C) Oral 62 16 -- 5' 7 (1.702 m) 56 kg  02/10/24 1622 (!) 105/57 98.1 F (36.7 C) Oral -- 16 97 % -- --  02/10/24 1435 -- 98.7 F (37.1 C) Oral -- -- -- -- --  02/10/24 1415 109/64 -- -- 62 17 100 % -- --    Physical exam: General appearance: appears older than stated age, no distress, and lethargic Abdomen: deferred GU: deferred Lungs: breathing comfortably Heart: S1, S2 normal, no murmur, rub or gallop, regular rate and rhythm Extremities: no lower extremity edema Skin: warm and dry Psych: appropriate  Medications Current Facility-Administered Medications  Medication Dose Route Frequency Provider Last Rate Last Admin   cefTRIAXone  (ROCEPHIN ) 1 g in sodium chloride  0.9 % 100 mL IVPB  1 g Intravenous Q24H Arnold, James G, MD 200 mL/hr at 02/11/24 0802 1 g at 02/11/24 0802   doxycycline  (VIBRAMYCIN ) 100 mg in sodium chloride  0.9 % 250 mL IVPB  100 mg Intravenous Q12H Tresia Fruit, MD 125 mL/hr at 02/11/24 1212 100 mg at 02/11/24 1212   enoxaparin  (LOVENOX ) injection 40 mg  40 mg Subcutaneous Daily Raynell Caller, MD  40 mg at 02/11/24 1204   HYDROcodone -acetaminophen  (NORCO/VICODIN) 5-325 MG per tablet 1-2 tablet  1-2 tablet Oral Q4H PRN Tresia Fruit, MD       ketorolac  (TORADOL ) 30 MG/ML injection 30 mg  30 mg Intravenous Q6H Tresia Fruit, MD   30 mg at 02/11/24 1203   Or   ketorolac  (TORADOL ) 30 MG/ML injection 30 mg  30 mg Intramuscular Q6H Tresia Fruit, MD       lactated ringers  infusion   Intravenous Continuous Tresia Fruit, MD 100 mL/hr at 02/11/24 0037 New Bag at 02/11/24 0037   metroNIDAZOLE  (FLAGYL ) IVPB 500 mg  500 mg Intravenous Q12H Tresia Fruit, MD 100 mL/hr at 02/11/24 1011 500 mg at 02/11/24 1011   ondansetron  (ZOFRAN ) tablet 4 mg  4 mg Oral Q6H PRN Tresia Fruit, MD       Or   ondansetron  (ZOFRAN ) injection 4 mg  4  mg Intravenous Q6H PRN Tresia Fruit, MD       oxyCODONE  (Oxy IR/ROXICODONE ) immediate release tablet 5-10 mg  5-10 mg Oral Q6H PRN Raynell Caller, MD       zolpidem (AMBIEN) tablet 5 mg  5 mg Oral QHS PRN Tresia Fruit, MD       Labs  Recent Labs  Lab 02/10/24 [phone] 02/11/24 0952  WBC 16.8* 17.9*  HGB 12.7 12.4  HCT 39.5 38.1  PLT 331 328    Recent Labs  Lab 02/10/24 0810 02/11/24 0952  NA 136 134*  K 3.9 4.1  CL 101 103  CO2 25 23  BUN 7 9  CREATININE 0.66 0.76  CALCIUM 8.7* 7.9*  PROT 6.8 5.7*  BILITOT 1.0 0.7  ALKPHOS 82 72  ALT 143* 95*  AST 215* 109*  GLUCOSE 94 245*    Radiology No new imaging  Assessment & Plan:  Patient stable *GYN: Radiology called to clarify sizes on CT scan of what was seen in the adnexae. D/w patient will get pelvic u/s. Continue IV abx; can see about transitioning to PO tomorrow. Follow up re: if wants to continue depo provera  -Last temp: 6/15 at 0730 38.3 *Abnormal pap: needs repeat and hpv testing in outpatient setting *Elevated glucose: f/u A1c, additional glucose values *HepC: appears to be new diagnosis. F/u additional labwork and f/u patient tomorrow re: if this is a new diagnosis *Pain: controlled with current PRNs *FEN/GI: ADAT to regular. Will do MIVF until taking better PO *PPx: lovenox  ordered and given at noon since not wearing SCDs *Dispo: pending clinical improvement  Code Status: Full Code  Total time taking care of the patient was 35 minutes, with greater than 50% of the time spent in face to face interaction with the patient.  Tyler Gallant MD Attending Center for [facility])

## 2024-02-11 NOTE — Progress Notes (Addendum)
 GYN Note 02/11/2024 Time: 0956  Patient sleeping comfortably; will come back later today  AM labs and pelvic u/s ordered for today; CT didn't give sizes but b/l hydrosalpinges appear to be about 4-5cm bilaterally. D/c norco for oxycodone  only, given liver enzymes Plan to try and switch to PO medications if patient doing okay with taking PO. Last Temp 38.3 on 6/15 at 0730  SCDs not on patient. Lovenox  ordered  Tyler Gallant MD Attending Center for [facility]) GYN Consult Phone: [phone] (M-F, 0800-1700) & [phone] (Off hours, weekends, holidays)

## 2024-02-11 NOTE — TOC Initial Note (Addendum)
 Transition of Care [facility]) - Initial/Assessment Note    Patient Details  Name: Morgan Donaldson MRN: [id number] Date of Birth: [birth year]  Transition of Care [facility]) CM/SW Contact:    Jannine Meo, RN Phone Number: 02/11/2024, 1:41 PM  Clinical Narrative:                 Patient diagnosed with PID, currently on IV abx. Patient reports being homeless, has history if IV drug abuse and using crack. Patient confirmed she has medicaid, but does not have a PCP. Patient will need transportation assistance, is ok with a bus pass.   Pelvic ultrasound ordered by provider. Expected Discharge Plan: Home/Self Care Barriers to Discharge: Continued Medical Work up   Patient Goals and CMS Choice            Expected Discharge Plan and Services       Living arrangements for the past 2 months: Homeless                                      Prior Living Arrangements/Services Living arrangements for the past 2 months: Homeless   Patient language and need for interpreter reviewed:: Yes        Need for Family Participation in Patient Care: No (Comment) Care giver support system in place?: No (comment)   Criminal Activity/Legal Involvement Pertinent to Current Situation/Hospitalization: No - Comment as needed  Activities of Daily Living   ADL Screening (condition at time of admission) Independently performs ADLs?: Yes (appropriate for developmental age) Is the patient deaf or have difficulty hearing?: No Does the patient have difficulty seeing, even when wearing glasses/contacts?: No Does the patient have difficulty concentrating, remembering, or making decisions?: No  Permission Sought/Granted                  Emotional Assessment Appearance:: Disheveled, Appears older than stated age Attitude/Demeanor/Rapport: Lethargic, Engaged Affect (typically observed): Calm, Flat Orientation: : Oriented to Self, Oriented to Place, Oriented to  Time, Oriented to Situation Alcohol  / Substance Use: Illicit Drugs Psych Involvement: No (comment)  Admission diagnosis:  PID (acute pelvic inflammatory disease) [N73.0] Patient Active Problem List   Diagnosis Date Noted   Hepatitis C antibody detected 02/11/2024   PID (acute pelvic inflammatory disease) 02/10/2024   Gonorrhea 12/10/2023   Group A streptococcal infection 12/09/2023   IV drug abuse (HCC) 12/09/2023   Acute cystitis 12/09/2023   GAD (generalized anxiety disorder) 06/21/2020   Attention deficit hyperactivity disorder (ADHD), 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   PCP:  Patient, No Pcp Per Pharmacy:   [id number] - [place] - [address] [place] [address] Phone: [phone] Fax: [phone]  [id number] - [place], [place] - [address] [place] [address] Phone: [phone] Fax: [phone]  [id number] - [address]), [place] - [address] [place] [address] Phone: [phone] Fax: [phone]  [facility] - [facility] [address] [place] [address] Phone: [phone] Fax: [phone]     Social Drivers of Health (  SDOH) Social History: SDOH Screenings   Food Insecurity: Food Insecurity Present (02/10/2024)  Housing: High Risk (02/10/2024)  Transportation Needs: Unmet Transportation Needs (02/10/2024)  Utilities: Not At Risk (12/08/2023)  Depression (PHQ2-9): Low Risk  (11/01/2020)  Tobacco Use: Medium Risk (02/10/2024)   SDOH Interventions:     Readmission Risk Interventions    12/09/2023   12:44 PM  Readmission Risk Prevention Plan  Post Dischage Appt  Complete  Medication Screening Complete  Transportation Screening Complete

## 2024-02-12 ENCOUNTER — Encounter: Payer: Self-pay | Admitting: Obstetrics and Gynecology

## 2024-02-12 DIAGNOSIS — N7093 Salpingitis and oophoritis, unspecified: Secondary | ICD-10-CM | POA: Diagnosis present

## 2024-02-12 LAB — CBC
HCT: 38.4 % (ref 36.0–46.0)
Hemoglobin: 12.6 g/dL (ref 12.0–15.0)
MCH: 29.2 pg (ref 26.0–34.0)
MCHC: 32.8 g/dL (ref 30.0–36.0)
MCV: 88.9 fL (ref 80.0–100.0)
Platelets: 340 10*3/uL (ref 150–400)
RBC: 4.32 MIL/uL (ref 3.87–5.11)
RDW: 14.9 % (ref 11.5–15.5)
WBC: 9 10*3/uL (ref 4.0–10.5)
nRBC: 0 % (ref 0.0–0.2)

## 2024-02-12 LAB — COMPREHENSIVE METABOLIC PANEL WITH GFR
ALT: 93 U/L — ABNORMAL HIGH (ref 0–44)
AST: 133 U/L — ABNORMAL HIGH (ref 15–41)
Albumin: 2.2 g/dL — ABNORMAL LOW (ref 3.5–5.0)
Alkaline Phosphatase: 63 U/L (ref 38–126)
Anion gap: 10 (ref 5–15)
BUN: 10 mg/dL (ref 6–20)
CO2: 22 mmol/L (ref 22–32)
Calcium: 8.2 mg/dL — ABNORMAL LOW (ref 8.9–10.3)
Chloride: 106 mmol/L (ref 98–111)
Creatinine, Ser: 0.66 mg/dL (ref 0.44–1.00)
GFR, Estimated: >60 mL/min (ref >60)
Glucose, Bld: 147 mg/dL — ABNORMAL HIGH (ref 70–99)
Potassium: 3.9 mmol/L (ref 3.5–5.1)
Sodium: 138 mmol/L (ref 135–145)
Total Bilirubin: 0.4 mg/dL (ref 0.0–1.2)
Total Protein: 5.9 g/dL — ABNORMAL LOW (ref 6.5–8.1)

## 2024-02-12 LAB — HEMOGLOBIN A1C
Hgb A1c MFr Bld: 5.4 % (ref 4.8–5.6)
Mean Plasma Glucose: 108.28 mg/dL

## 2024-02-12 MED ORDER — DOXYCYCLINE HYCLATE 100 MG PO TABS
100.0000 mg | ORAL_TABLET | Freq: Two times a day (BID) | ORAL | Status: DC
  Administered 2024-02-12: 100 mg via ORAL
  Filled 2024-02-12: qty 1

## 2024-02-12 MED ORDER — MEDROXYPROGESTERONE ACETATE 150 MG/ML IM SUSP
150.0000 mg | Freq: Once | INTRAMUSCULAR | Status: DC
  Filled 2024-02-12: qty 1

## 2024-02-12 MED ORDER — AMOXICILLIN-POT CLAVULANATE 875-125 MG PO TABS: 1.0000 | ORAL_TABLET | Freq: Two times a day (BID) | ORAL | 0 refills | Status: AC

## 2024-02-12 MED ORDER — METRONIDAZOLE 500 MG PO TABS
500.0000 mg | ORAL_TABLET | Freq: Two times a day (BID) | ORAL | Status: DC
  Administered 2024-02-12: 500 mg via ORAL
  Filled 2024-02-12: qty 1

## 2024-02-12 NOTE — Progress Notes (Signed)
 Patient left AMA, IV removed, and MD Pickens made aware.

## 2024-02-12 NOTE — Progress Notes (Signed)
 Gynecology Progress Note  Admission Date: 02/10/2024 Current Date: 02/12/2024 8:55 AM  Morgan Donaldson is a 43 y.o. G0 admitted for fevers, pain, PID, b/l hydrosalpinges concerning for pyosalpinges   History complicated by: Patient Active Problem List   Diagnosis Date Noted   Hepatitis C antibody detected 02/11/2024   PID (acute pelvic inflammatory disease) 02/10/2024   Gonorrhea 12/10/2023   Group A streptococcal infection 12/09/2023   IV drug abuse (HCC) 12/09/2023   Acute cystitis 12/09/2023   GAD (generalized anxiety disorder) 06/21/2020   Attention deficit hyperactivity disorder (ADHD), 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    ROS and patient/family/surgical history, located on admission H&P note dated 02/10/2024, have been reviewed, and there are no changes except as noted below Yesterday/Overnight Events:  none  Subjective:  Patient awake and alert. Did well with breakfast. Pelvic pain improved. No VB or discharge.   Objective:    Current Vital Signs 24h Vital Sign Ranges  T 98 F (36.7 C) Temp  Avg: 98.4 F (36.9 C)  Min: 98 F (36.7 C)  Max: 99 F (37.2 C)  BP 131/69 BP  Min: 124/67  Max: 147/87  HR 74 Pulse  Avg: 68.3  Min: 57  Max: 79  RR 17 Resp  Avg: 17  Min: 16  Max: 18  SaO2 100 % Room Air SpO2  Avg: 100 %  Min: 100 %  Max: 100 %       24 Hour I/O Current Shift I/O  Time Ins Outs 06/16 0701 - 06/17 0700 In: 1689 [P.O.:720; I.V.:519] Out: -  No intake/output data recorded.   Patient Vitals for the past 24 hrs:  BP Temp Temp src Pulse Resp SpO2  02/12/24 0846 131/69 98 F (36.7 C) Oral 74 17 100 %  02/12/24 0447 (!) 147/87 98.2 F (36.8 C) Oral (!) 57 16 100 %  02/11/24 2110 125/72 98.2 F (36.8 C) Oral 63 17  100 %  02/11/24 1536 124/67 99 F (37.2 C) Oral 79 18 100 %   Physical exam: General appearance: NAD Abdomen: soft, minimally ttp, non distended GU: deferred Lungs: breathing comfortably Extremities: no lower extremity edema Skin: warm and dry Psych: appropriate  Medications Current Facility-Administered Medications  Medication Dose Route Frequency Provider Last Rate Last Admin   cefTRIAXone  (ROCEPHIN ) 1 g in sodium chloride  0.9 % 100 mL IVPB  1 g Intravenous Q24H Tresia Fruit, MD 200 mL/hr at 02/12/24 0812 1 g at 02/12/24 1610   dextrose  5 % in lactated ringers  infusion   Intravenous Continuous Raynell Caller, MD   Stopped at 02/11/24 2337   doxycycline  (VIBRAMYCIN ) 100 mg in sodium chloride  0.9 % 250 mL IVPB  100 mg Intravenous Q12H Tresia Fruit, MD 125 mL/hr at 02/11/24 2337 100 mg at 02/11/24 2337   enoxaparin  (LOVENOX ) injection 40 mg  40 mg Subcutaneous Daily Raynell Caller, MD   40 mg at 02/11/24 1204   HYDROcodone -acetaminophen  (NORCO/VICODIN) 5-325 MG per tablet 1-2 tablet  1-2 tablet Oral Q4H PRN Arnold, James G, MD       ketorolac  (TORADOL ) 30 MG/ML injection 30 mg  30 mg Intravenous Q6H Arnold, James G, MD   30 mg at 02/12/24 0640   Or   ketorolac  (TORADOL ) 30 MG/ML injection  30 mg  30 mg Intramuscular Q6H Tresia Fruit, MD       metroNIDAZOLE  (FLAGYL ) IVPB 500 mg  500 mg Intravenous Q12H Tresia Fruit, MD   Stopped at 02/11/24 2300   ondansetron  (ZOFRAN ) tablet 4 mg  4 mg Oral Q6H PRN Tresia Fruit, MD       Or   ondansetron  (ZOFRAN ) injection 4 mg  4 mg Intravenous Q6H PRN Arnold, James G, MD       oxyCODONE  (Oxy IR/ROXICODONE ) immediate release tablet 5-10 mg  5-10 mg Oral Q6H PRN Raynell Caller, MD       Labs  Recent Labs  Lab 02/10/24 0810 02/11/24 0952  WBC 16.8* 17.9*  HGB 12.7 12.4  HCT 39.5 38.1  PLT 331 328    Recent Labs  Lab 02/10/24 0810 02/11/24 0952  NA 136 134*  K 3.9 4.1  CL 101 103  CO2 25 23  BUN 7 9  CREATININE 0.66  0.76  CALCIUM 8.7* 7.9*  PROT 6.8 5.7*  BILITOT 1.0 0.7  ALKPHOS 82 72  ALT 143* 95*  AST 215* 109*  GLUCOSE 94 245*   Radiology Narrative & Impression  CLINICAL DATA:  Pyo salpingitis   EXAM: TRANSABDOMINAL AND TRANSVAGINAL ULTRASOUND OF PELVIS   TECHNIQUE: Both transabdominal and transvaginal ultrasound examinations of the pelvis were performed. Transabdominal technique was performed for global imaging of the pelvis including uterus, ovaries, adnexal regions, and pelvic cul-de-sac. It was necessary to proceed with endovaginal exam following the transabdominal exam to visualize the uterus endometrium adnexa.   COMPARISON:  CT 02/10/2024   FINDINGS: Uterus   Measurements: 5.7 x 2.5 x 2.6 cm = volume: 19.2 mL. No fibroids or other mass visualized.   Endometrium   Thickness: 1 mm.  No focal abnormality visualized.   Right ovary   Measurements: 2.8 x 1.5 x 1.5 cm = volume: 3.1 mL. Complex tubular structure in the right adnexa measuring up to 2.2 cm in diameter.   Left ovary   Measurements: 2.7 x 1.5 x 1.5 cm = volume: 3.0 mL. Complex tubular structure in the left adnexa measuring up to 4.2 cm.   Other findings   No abnormal free fluid.   IMPRESSION: 1. Limited study secondary to pain and premature termination of endovaginal portion of the exam 2. Complex left greater than right tubular structures in the adnexa, felt to correspond to enhancing hydrosalpinges on CT and presumably related to the history of bilateral pyosalpinx     Electronically Signed   By: Esmeralda Hedge M.D.   On: 02/11/2024 19:10   Assessment & Plan:  Patient stable *GYN: Labs just drawn. Follow up radiology addendum to CT re: sizes of adnexal structures. LMP due to start and patient would like to go back on depo provera , which was ordered and will check vitamin d level.  D/w her that if WBC has decreased well then can consider switch to PO abx today. D/w her need for several weeks of PO  abx at discharge.  -Last temp: 6/15 at 0730 38.3 *Abnormal pap: needs repeat and hpv testing in outpatient setting *Elevated glucose: f/u A1c, additional glucose values *HepC: appears to be new diagnosis. F/u additional labwork and f/u patient tomorrow re: if this is a new diagnosis *Pain: controlled with current PRNs *FEN/GI: regular. Saline lock IV *PPx: lovenox   *Dispo: pending clinical improvement, likely Friday or Saturday  Code Status: Full Code  Total time taking care of the patient was 20 minutes,  with greater than 50% of the time spent in face to face interaction with the patient.  Tyler Gallant MD Attending Center for [facility] GYN Consult Phone: [phone] (M-F, 0800-1700) & [phone] (Off hours, weekends, holidays)

## 2024-02-12 NOTE — Discharge Summary (Signed)
 Physician Discharge Summary  Patient ID: Morgan Donaldson MRN: [id number] DOB/AGE: Jul 24, 1981 43 y.o.  Admit date: 02/10/2024 Discharge date: 02/12/2024  Admission Diagnoses: PID. Bilateral hydrosalpinges concerning for pyosalinges. Fever. Pelvic pain  Discharge Diagnoses: Left AMA.  Principal Problem:   PID (acute pelvic inflammatory disease) Active Problems:   Gonorrhea   Hepatitis C antibody detected   Pyosalpingitis  Discharged Condition: fair  Hospital Course: Patient admitted and started on IV rocephin , doxycycline  and flagyl . Admit CT scan showed bilateral adnexal masses. Radiology was asked to clarify sizes but they appeared to be approximately 4-5cm bilaterally consistent with inflammation of the fallopian tubes. On HD#3, she improved significantly both clinically and on her labs.  At this point, she insisted on leaving AMA because she was not permitted, via unit policy, to go outside unescorted, which I told her earlier that day. Later that morning, I was told by her nurse that the patient was leaving AMA; she left before I had a chance to go speak to her. She also left before I had a chance to tell her about her swab coming back positive for gonorrhea and new hepC antibody that was positive.   I did call her at [phone] and did not get an answer. I did leave a voicemail stating that Morgan Donaldson office will try and reach out to her to set up an appointment for her sometime this week or next week. I also sent her an in basket message.   Discharge Exam: Blood pressure 131/69, pulse 74, temperature 98 F (36.7 C), temperature source Oral, resp. rate 17, height 5' 7 (1.702 m), weight 56 kg, SpO2 100%. Patient left AMA  Disposition:  Patient left AMA   Allergies as of 02/12/2024   No Known Allergies      Medication List     STOP taking these medications    cephALEXin  500 MG capsule Commonly known as: KEFLEX    ondansetron  4 MG disintegrating tablet Commonly known as:  ZOFRAN -ODT       TAKE these medications    amoxicillin -clavulanate 875-125 MG tablet Commonly known as: AUGMENTIN Take 1 tablet by mouth 2 (two) times daily.         SignedRaynell Caller 02/12/2024, 1:09 PM

## 2024-02-12 NOTE — Progress Notes (Signed)
 pt is trying to leave the floor for the main purpose of smoking.explained to her the hospital policy regarding smoking but she said that she would go across the street to smoke. Explained to her that staff wouldn't be able to escort her as she need to be monitored while off the floor. offered a nicotine  patch which she declined stating that it didn't work for her. Pt now asking to leave AMA

## 2024-02-13 LAB — HCV RNA QUANT RFLX ULTRA OR GENOTYP
HCV RNA Qnt(log copy/mL): 6.778 {Log_IU}/mL
HepC Qn: 6000000 [IU]/mL

## 2024-02-13 LAB — HEPATITIS C GENOTYPE

## 2024-02-15 LAB — CULTURE, BLOOD (ROUTINE X 2)
Culture: NO GROWTH
Culture: NO GROWTH
Special Requests: ADEQUATE

## 2024-03-25 ENCOUNTER — Emergency Department (HOSPITAL_COMMUNITY)

## 2024-03-25 ENCOUNTER — Emergency Department (HOSPITAL_COMMUNITY)
Admission: EM | Admit: 2024-03-25 | Discharge: 2024-03-25 | Disposition: A | Discharge status: Home / Self Care | Attending: Emergency Medicine | Admitting: Emergency Medicine

## 2024-03-25 ENCOUNTER — Other Ambulatory Visit: Payer: Self-pay

## 2024-03-25 DIAGNOSIS — S41032A Puncture wound without foreign body of left shoulder, initial encounter: Secondary | ICD-10-CM | POA: Diagnosis not present

## 2024-03-25 DIAGNOSIS — Z23 Encounter for immunization: Secondary | ICD-10-CM | POA: Insufficient documentation

## 2024-03-25 DIAGNOSIS — T148XXA Other injury of unspecified body region, initial encounter: Secondary | ICD-10-CM

## 2024-03-25 DIAGNOSIS — M898X9 Other specified disorders of bone, unspecified site: Secondary | ICD-10-CM

## 2024-03-25 DIAGNOSIS — S0104XA Puncture wound with foreign body of scalp, initial encounter: Secondary | ICD-10-CM | POA: Diagnosis present

## 2024-03-25 LAB — I-STAT CHEM 8, ED
BUN: 9 mg/dL (ref 6–20)
Calcium, Ion: 0.99 mmol/L — ABNORMAL LOW (ref 1.15–1.40)
Chloride: 105 mmol/L (ref 98–111)
Creatinine, Ser: 0.7 mg/dL (ref 0.44–1.00)
Glucose, Bld: 132 mg/dL — ABNORMAL HIGH (ref 70–99)
HCT: 38 % (ref 36.0–46.0)
Hemoglobin: 12.9 g/dL (ref 12.0–15.0)
Potassium: 3.2 mmol/L — ABNORMAL LOW (ref 3.5–5.1)
Sodium: 140 mmol/L (ref 135–145)
TCO2: 24 mmol/L (ref 22–32)

## 2024-03-25 LAB — I-STAT CG4 LACTIC ACID, ED: Lactic Acid, Venous: 3.2 mmol/L (ref 0.5–1.9)

## 2024-03-25 MED ORDER — FENTANYL CITRATE PF 50 MCG/ML IJ SOSY
50.0000 ug | PREFILLED_SYRINGE | Freq: Once | INTRAMUSCULAR | Status: AC
  Administered 2024-03-25: 50 ug via INTRAVENOUS
  Filled 2024-03-25: qty 1

## 2024-03-25 MED ORDER — TETANUS-DIPHTH-ACELL PERTUSSIS 5-2.5-18.5 LF-MCG/0.5 IM SUSY
0.5000 mL | PREFILLED_SYRINGE | Freq: Once | INTRAMUSCULAR | Status: AC
  Administered 2024-03-25: 0.5 mL via INTRAMUSCULAR

## 2024-03-25 MED ORDER — LIDOCAINE-EPINEPHRINE 1 %-1:100000 IJ SOLN
20.0000 mL | Freq: Once | INTRAMUSCULAR | Status: AC
  Administered 2024-03-25: 20 mL
  Filled 2024-03-25: qty 1

## 2024-03-25 MED ORDER — CEFAZOLIN SODIUM-DEXTROSE 1-4 GM/50ML-% IV SOLN
1.0000 g | Freq: Once | INTRAVENOUS | Status: AC
  Administered 2024-03-25: 1 g via INTRAVENOUS
  Filled 2024-03-25: qty 50

## 2024-03-25 MED ORDER — CEPHALEXIN 500 MG PO CAPS: 500.0000 mg | ORAL_CAPSULE | Freq: Four times a day (QID) | ORAL | 0 refills | Status: DC

## 2024-03-25 MED ORDER — LIDOCAINE-EPINEPHRINE-TETRACAINE (LET) TOPICAL GEL
3.0000 mL | Freq: Once | TOPICAL | Status: AC
  Administered 2024-03-25: 3 mL via TOPICAL
  Filled 2024-03-25: qty 3

## 2024-03-25 MED ORDER — TETANUS-DIPHTH-ACELL PERTUSSIS 5-2.5-18.5 LF-MCG/0.5 IM SUSY
0.5000 mL | PREFILLED_SYRINGE | Freq: Once | INTRAMUSCULAR | Status: DC
  Filled 2024-03-25: qty 0.5
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
## 2024-03-25 NOTE — Progress Notes (Signed)
 Orthopedic Tech Progress Note Patient Details:  Morgan Donaldson 1981-06-16 [id number]  Patient ID: [name], female   DOB: Jul 20, 1981, 43 y.o.   MRN: [id number] I attended trauma page. Chandra Dorn PARAS 03/25/2024, 3:52 AM

## 2024-05-17 ENCOUNTER — Emergency Department (HOSPITAL_COMMUNITY)

## 2024-05-17 ENCOUNTER — Other Ambulatory Visit: Payer: Self-pay

## 2024-05-17 ENCOUNTER — Encounter (HOSPITAL_COMMUNITY): Payer: Self-pay | Admitting: *Deleted

## 2024-05-17 ENCOUNTER — Emergency Department (HOSPITAL_COMMUNITY)
Admission: EM | Admit: 2024-05-17 | Discharge: 2024-05-18 | Disposition: A | Discharge status: Home / Self Care | Attending: Emergency Medicine | Admitting: Emergency Medicine

## 2024-05-17 DIAGNOSIS — R109 Unspecified abdominal pain: Secondary | ICD-10-CM | POA: Insufficient documentation

## 2024-05-17 DIAGNOSIS — J690 Pneumonitis due to inhalation of food and vomit: Secondary | ICD-10-CM | POA: Diagnosis not present

## 2024-05-17 DIAGNOSIS — S2231XA Fracture of one rib, right side, initial encounter for closed fracture: Secondary | ICD-10-CM | POA: Diagnosis not present

## 2024-05-17 DIAGNOSIS — R0781 Pleurodynia: Secondary | ICD-10-CM | POA: Diagnosis present

## 2024-05-17 DIAGNOSIS — R519 Headache, unspecified: Secondary | ICD-10-CM | POA: Insufficient documentation

## 2024-05-17 LAB — CBC WITH DIFFERENTIAL/PLATELET
Abs Immature Granulocytes: 0.04 K/uL (ref 0.00–0.07)
Basophils Absolute: 0 K/uL (ref 0.0–0.1)
Basophils Relative: 0 %
Eosinophils Absolute: 0.3 K/uL (ref 0.0–0.5)
Eosinophils Relative: 4 %
HCT: 39.7 % (ref 36.0–46.0)
Hemoglobin: 12.8 g/dL (ref 12.0–15.0)
Immature Granulocytes: 0 %
Lymphocytes Relative: 23 %
Lymphs Abs: 2.1 K/uL (ref 0.7–4.0)
MCH: 29.4 pg (ref 26.0–34.0)
MCHC: 32.2 g/dL (ref 30.0–36.0)
MCV: 91.3 fL (ref 80.0–100.0)
Monocytes Absolute: 0.8 K/uL (ref 0.1–1.0)
Monocytes Relative: 8 %
Neutro Abs: 5.8 K/uL (ref 1.7–7.7)
Neutrophils Relative %: 65 %
Platelets: 286 K/uL (ref 150–400)
RBC: 4.35 MIL/uL (ref 3.87–5.11)
RDW: 14.1 % (ref 11.5–15.5)
WBC: 9 K/uL (ref 4.0–10.5)
nRBC: 0 % (ref 0.0–0.2)

## 2024-05-17 LAB — COMPREHENSIVE METABOLIC PANEL WITH GFR
ALT: 144 U/L — ABNORMAL HIGH (ref 0–44)
AST: 115 U/L — ABNORMAL HIGH (ref 15–41)
Albumin: 3 g/dL — ABNORMAL LOW (ref 3.5–5.0)
Alkaline Phosphatase: 71 U/L (ref 38–126)
Anion gap: 11 (ref 5–15)
BUN: 11 mg/dL (ref 6–20)
CO2: 26 mmol/L (ref 22–32)
Calcium: 8.8 mg/dL — ABNORMAL LOW (ref 8.9–10.3)
Chloride: 104 mmol/L (ref 98–111)
Creatinine, Ser: 0.78 mg/dL (ref 0.44–1.00)
GFR, Estimated: >60 mL/min (ref >60)
Glucose, Bld: 118 mg/dL — ABNORMAL HIGH (ref 70–99)
Potassium: 3.5 mmol/L (ref 3.5–5.1)
Sodium: 141 mmol/L (ref 135–145)
Total Bilirubin: 0.5 mg/dL (ref 0.0–1.2)
Total Protein: 7 g/dL (ref 6.5–8.1)

## 2024-05-17 LAB — I-STAT CHEM 8, ED
BUN: 12 mg/dL (ref 6–20)
Calcium, Ion: 1.13 mmol/L — ABNORMAL LOW (ref 1.15–1.40)
Chloride: 103 mmol/L (ref 98–111)
Creatinine, Ser: 0.7 mg/dL (ref 0.44–1.00)
Glucose, Bld: 120 mg/dL — ABNORMAL HIGH (ref 70–99)
HCT: 34 % — ABNORMAL LOW (ref 36.0–46.0)
Hemoglobin: 11.6 g/dL — ABNORMAL LOW (ref 12.0–15.0)
Potassium: 3.5 mmol/L (ref 3.5–5.1)
Sodium: 142 mmol/L (ref 135–145)
TCO2: 28 mmol/L (ref 22–32)

## 2024-05-17 LAB — HCG, SERUM, QUALITATIVE: Preg, Serum: NEGATIVE

## 2024-05-17 MED ORDER — MORPHINE SULFATE (PF) 4 MG/ML IV SOLN
4.0000 mg | Freq: Once | INTRAVENOUS | Status: AC
  Administered 2024-05-17: 4 mg via INTRAVENOUS
  Filled 2024-05-17: qty 1

## 2024-05-17 MED ORDER — OXYCODONE-ACETAMINOPHEN 5-325 MG PO TABS
ORAL_TABLET | ORAL | Status: AC
  Filled 2024-05-17: qty 1

## 2024-05-17 MED ORDER — ONDANSETRON HCL 4 MG/2ML IJ SOLN
4.0000 mg | Freq: Once | INTRAMUSCULAR | Status: AC
  Administered 2024-05-17: 4 mg via INTRAVENOUS
  Filled 2024-05-17: qty 2

## 2024-05-17 MED ORDER — IPRATROPIUM-ALBUTEROL 0.5-2.5 (3) MG/3ML IN SOLN
3.0000 mL | Freq: Once | RESPIRATORY_TRACT | Status: AC
  Administered 2024-05-17: 3 mL via RESPIRATORY_TRACT
  Filled 2024-05-17: qty 3

## 2024-05-17 MED ORDER — METHYLPREDNISOLONE SODIUM SUCC 125 MG IJ SOLR
125.0000 mg | Freq: Once | INTRAMUSCULAR | Status: AC
  Administered 2024-05-17: 125 mg via INTRAVENOUS
  Filled 2024-05-17: qty 2

## 2024-05-17 MED ORDER — IOHEXOL 350 MG/ML SOLN
75.0000 mL | Freq: Once | INTRAVENOUS | Status: AC | PRN
Start: 2024-05-17 — End: 2024-05-17
  Administered 2024-05-17: 75 mL via INTRAVENOUS

## 2024-05-17 MED ORDER — AMOXICILLIN-POT CLAVULANATE 875-125 MG PO TABS
1.0000 | ORAL_TABLET | Freq: Two times a day (BID) | ORAL | 0 refills | Status: DC
Start: 2024-05-17 — End: 2024-07-09

## 2024-05-17 MED ORDER — AMOXICILLIN-POT CLAVULANATE 875-125 MG PO TABS
1.0000 | ORAL_TABLET | Freq: Once | ORAL | Status: AC
  Administered 2024-05-18: 1 via ORAL
  Filled 2024-05-17: qty 1

## 2024-05-17 MED ORDER — OXYCODONE-ACETAMINOPHEN 5-325 MG PO TABS
1.0000 | ORAL_TABLET | Freq: Once | ORAL | Status: AC
  Administered 2024-05-17: 1 via ORAL
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
## 2024-05-17 NOTE — ED Notes (Signed)
Pt provided snack and beverage 

## 2024-05-17 NOTE — ED Notes (Signed)
 No answer on first call to Triage.

## 2024-05-17 NOTE — Discharge Instructions (Addendum)
 It was a pleasure taking care of you here today.  Your CT scan showed pneumonia.  We have started you on antibiotics.  Make sure to follow-up outpatient, return for any worsening symptom
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
## 2024-07-08 ENCOUNTER — Ambulatory Visit (HOSPITAL_COMMUNITY)
Admission: EM | Admit: 2024-07-08 | Discharge: 2024-07-10 | Disposition: A | Discharge status: Other Acute Inpatient Hospital | Attending: Psychiatry | Admitting: Psychiatry

## 2024-07-08 DIAGNOSIS — F1910 Other psychoactive substance abuse, uncomplicated: Secondary | ICD-10-CM

## 2024-07-08 DIAGNOSIS — Z5902 Unsheltered homelessness: Secondary | ICD-10-CM | POA: Insufficient documentation

## 2024-07-08 DIAGNOSIS — F1429 Cocaine dependence with unspecified cocaine-induced disorder: Secondary | ICD-10-CM | POA: Insufficient documentation

## 2024-07-08 DIAGNOSIS — Z560 Unemployment, unspecified: Secondary | ICD-10-CM | POA: Insufficient documentation

## 2024-07-08 DIAGNOSIS — F319 Bipolar disorder, unspecified: Secondary | ICD-10-CM | POA: Insufficient documentation

## 2024-07-08 DIAGNOSIS — F1129 Opioid dependence with unspecified opioid-induced disorder: Secondary | ICD-10-CM | POA: Insufficient documentation

## 2024-07-08 DIAGNOSIS — Z638 Other specified problems related to primary support group: Secondary | ICD-10-CM | POA: Insufficient documentation

## 2024-07-08 LAB — CBC WITH DIFFERENTIAL/PLATELET
Abs Immature Granulocytes: 0.04 K/uL (ref 0.00–0.07)
Basophils Absolute: 0.1 K/uL (ref 0.0–0.1)
Basophils Relative: 1 %
Eosinophils Absolute: 0 K/uL (ref 0.0–0.5)
Eosinophils Relative: 0 %
HCT: 49.4 % — ABNORMAL HIGH (ref 36.0–46.0)
Hemoglobin: 16.5 g/dL — ABNORMAL HIGH (ref 12.0–15.0)
Immature Granulocytes: 0 %
Lymphocytes Relative: 17 %
Lymphs Abs: 1.8 K/uL (ref 0.7–4.0)
MCH: 29.2 pg (ref 26.0–34.0)
MCHC: 33.4 g/dL (ref 30.0–36.0)
MCV: 87.4 fL (ref 80.0–100.0)
Monocytes Absolute: 0.7 K/uL (ref 0.1–1.0)
Monocytes Relative: 7 %
Neutro Abs: 7.9 K/uL — ABNORMAL HIGH (ref 1.7–7.7)
Neutrophils Relative %: 75 %
Platelets: 422 K/uL — ABNORMAL HIGH (ref 150–400)
RBC: 5.65 MIL/uL — ABNORMAL HIGH (ref 3.87–5.11)
RDW: 13.7 % (ref 11.5–15.5)
WBC: 10.6 K/uL — ABNORMAL HIGH (ref 4.0–10.5)
nRBC: 0 % (ref 0.0–0.2)

## 2024-07-08 LAB — COMPREHENSIVE METABOLIC PANEL WITH GFR
ALT: 132 U/L — ABNORMAL HIGH (ref 0–44)
AST: 109 U/L — ABNORMAL HIGH (ref 15–41)
Albumin: 3.5 g/dL (ref 3.5–5.0)
Alkaline Phosphatase: 74 U/L (ref 38–126)
Anion gap: 13 (ref 5–15)
BUN: 10 mg/dL (ref 6–20)
CO2: 27 mmol/L (ref 22–32)
Calcium: 9.9 mg/dL (ref 8.9–10.3)
Chloride: 100 mmol/L (ref 98–111)
Creatinine, Ser: 0.74 mg/dL (ref 0.44–1.00)
GFR, Estimated: >60 mL/min (ref >60)
Glucose, Bld: 113 mg/dL — ABNORMAL HIGH (ref 70–99)
Potassium: 4.1 mmol/L (ref 3.5–5.1)
Sodium: 140 mmol/L (ref 135–145)
Total Bilirubin: 0.3 mg/dL (ref 0.0–1.2)
Total Protein: 7.9 g/dL (ref 6.5–8.1)

## 2024-07-08 LAB — LIPID PANEL
Cholesterol: 174 mg/dL (ref 0–200)
HDL: 42 mg/dL (ref >40)
LDL Cholesterol: 112 mg/dL — ABNORMAL HIGH (ref 0–99)
Total CHOL/HDL Ratio: 4.1 ratio
Triglycerides: 102 mg/dL (ref <150)
VLDL: 20 mg/dL (ref 0–40)

## 2024-07-08 LAB — ETHANOL: Alcohol, Ethyl (B): <15 mg/dL (ref <15)

## 2024-07-08 LAB — TSH: TSH: 0.112 u[IU]/mL — ABNORMAL LOW (ref 0.350–4.500)

## 2024-07-08 LAB — MAGNESIUM: Magnesium: 2.1 mg/dL (ref 1.7–2.4)

## 2024-07-08 LAB — HEMOGLOBIN A1C
Hgb A1c MFr Bld: 5.5 % (ref 4.8–5.6)
Mean Plasma Glucose: 111.15 mg/dL

## 2024-07-08 MED ORDER — ALUM & MAG HYDROXIDE-SIMETH 200-200-20 MG/5ML PO SUSP: 30.0000 mL | ORAL | Status: DC | PRN

## 2024-07-08 MED ORDER — HYDROXYZINE HCL 25 MG PO TABS
25.0000 mg | ORAL_TABLET | Freq: Four times a day (QID) | ORAL | Status: DC | PRN
  Administered 2024-07-09: 25 mg via ORAL
  Filled 2024-07-08: qty 1

## 2024-07-08 MED ORDER — DIPHENHYDRAMINE HCL 50 MG/ML IJ SOLN: 50.0000 mg | Freq: Three times a day (TID) | INTRAMUSCULAR | Status: DC | PRN

## 2024-07-08 MED ORDER — LOPERAMIDE HCL 2 MG PO CAPS: 2.0000 mg | ORAL_CAPSULE | ORAL | Status: DC | PRN

## 2024-07-08 MED ORDER — NAPROXEN 500 MG PO TABS: 500.0000 mg | ORAL_TABLET | Freq: Two times a day (BID) | ORAL | Status: DC | PRN

## 2024-07-08 MED ORDER — HALOPERIDOL LACTATE 5 MG/ML IJ SOLN: 5.0000 mg | Freq: Three times a day (TID) | INTRAMUSCULAR | Status: DC | PRN

## 2024-07-08 MED ORDER — ONDANSETRON 4 MG PO TBDP
4.0000 mg | ORAL_TABLET | Freq: Four times a day (QID) | ORAL | Status: DC | PRN
  Administered 2024-07-09: 4 mg via ORAL
  Filled 2024-07-08: qty 1

## 2024-07-08 MED ORDER — MAGNESIUM HYDROXIDE 400 MG/5ML PO SUSP: 30.0000 mL | Freq: Every day | ORAL | Status: DC | PRN

## 2024-07-08 MED ORDER — METHOCARBAMOL 500 MG PO TABS: 500.0000 mg | ORAL_TABLET | Freq: Three times a day (TID) | ORAL | Status: DC | PRN

## 2024-07-08 MED ORDER — LORAZEPAM 2 MG/ML IJ SOLN: 2.0000 mg | Freq: Three times a day (TID) | INTRAMUSCULAR | Status: DC | PRN

## 2024-07-08 MED ORDER — HALOPERIDOL 5 MG PO TABS: 5.0000 mg | ORAL_TABLET | Freq: Three times a day (TID) | ORAL | Status: DC | PRN

## 2024-07-08 MED ORDER — ACETAMINOPHEN 325 MG PO TABS
650.0000 mg | ORAL_TABLET | Freq: Four times a day (QID) | ORAL | Status: DC | PRN
  Administered 2024-07-09: 650 mg via ORAL
  Filled 2024-07-08: qty 2

## 2024-07-08 MED ORDER — TRAZODONE HCL 50 MG PO TABS: 50.0000 mg | ORAL_TABLET | Freq: Every evening | ORAL | Status: DC | PRN

## 2024-07-08 MED ORDER — DICYCLOMINE HCL 20 MG PO TABS: 20.0000 mg | ORAL_TABLET | Freq: Four times a day (QID) | ORAL | Status: DC | PRN

## 2024-07-08 MED ORDER — DIPHENHYDRAMINE HCL 50 MG PO CAPS: 50.0000 mg | ORAL_CAPSULE | Freq: Three times a day (TID) | ORAL | Status: DC | PRN

## 2024-07-08 MED ORDER — HALOPERIDOL LACTATE 5 MG/ML IJ SOLN: 10.0000 mg | Freq: Three times a day (TID) | INTRAMUSCULAR | Status: DC | PRN
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
## 2024-07-08 NOTE — ED Notes (Signed)
 Pt is sleeping at this moment. No acute distress noted.Q15 safety checks in place.
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
## 2024-07-08 NOTE — Progress Notes (Signed)
 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 Pt is alert and oriented X3. Pt is ambulatory and is oriented to staff/unit. Pt reported hx of fall due to foot drop. Pt was educated on fall risk and advised to ambulate with care. Pt was receptive. Pt complained of generalized ache due to withdrawal. Pt was cooperative with labs and skin assessment. Abscess was noted in pt's left armpit. Pt also has bruises on her lateral upper torso. Pt reported that is from the straps of her bra. Pt denies current SI/HI/AVH, plan or intent.staff will monitor for pt's safety.

## 2024-07-09 DIAGNOSIS — F1910 Other psychoactive substance abuse, uncomplicated: Secondary | ICD-10-CM | POA: Diagnosis not present

## 2024-07-09 LAB — POCT URINE DRUG SCREEN - MANUAL ENTRY (I-SCREEN)
POC Amphetamine UR: NOT DETECTED
POC Buprenorphine (BUP): NOT DETECTED
POC Cocaine UR: POSITIVE — AB
POC Marijuana UR: POSITIVE — AB
POC Methadone UR: NOT DETECTED
POC Methamphetamine UR: NOT DETECTED
POC Morphine: POSITIVE — AB
POC Oxazepam (BZO): NOT DETECTED
POC Oxycodone UR: NOT DETECTED
POC Secobarbital (BAR): NOT DETECTED

## 2024-07-09 LAB — URINALYSIS, ROUTINE W REFLEX MICROSCOPIC
Bilirubin Urine: NEGATIVE
Glucose, UA: NEGATIVE mg/dL
Hgb urine dipstick: NEGATIVE
Ketones, ur: NEGATIVE mg/dL
Nitrite: NEGATIVE
Protein, ur: NEGATIVE mg/dL
Specific Gravity, Urine: 1.017 (ref 1.005–1.030)
pH: 7 (ref 5.0–8.0)

## 2024-07-09 LAB — POC URINE PREG, ED: Preg Test, Ur: NEGATIVE

## 2024-07-09 LAB — POCT PREGNANCY, URINE: Preg Test, Ur: NEGATIVE

## 2024-07-09 MED ORDER — LOPERAMIDE HCL 2 MG PO CAPS: 2.0000 mg | ORAL_CAPSULE | ORAL | Status: DC | PRN

## 2024-07-09 MED ORDER — NITROFURANTOIN MONOHYD MACRO 100 MG PO CAPS
100.0000 mg | ORAL_CAPSULE | Freq: Two times a day (BID) | ORAL | Status: DC
Start: 2024-07-09 — End: 2024-07-10
  Administered 2024-07-09 – 2024-07-10 (×3): 100 mg via ORAL
  Filled 2024-07-09 (×3): qty 1

## 2024-07-09 MED ORDER — NICOTINE 21 MG/24HR TD PT24
21.0000 mg | MEDICATED_PATCH | Freq: Every day | TRANSDERMAL | Status: DC
  Administered 2024-07-09 – 2024-07-10 (×2): 21 mg via TRANSDERMAL
  Filled 2024-07-09 (×2): qty 1

## 2024-07-09 MED ORDER — TRAZODONE HCL 100 MG PO TABS
100.0000 mg | ORAL_TABLET | Freq: Every evening | ORAL | Status: DC | PRN
  Administered 2024-07-09: 100 mg via ORAL
  Filled 2024-07-09: qty 1

## 2024-07-09 MED ORDER — CLONIDINE HCL 0.1 MG PO TABS: 0.1000 mg | ORAL_TABLET | Freq: Two times a day (BID) | ORAL | Status: DC

## 2024-07-09 MED ORDER — CLONIDINE HCL 0.1 MG PO TABS
0.1000 mg | ORAL_TABLET | Freq: Four times a day (QID) | ORAL | Status: DC
  Administered 2024-07-09 – 2024-07-10 (×5): 0.1 mg via ORAL
  Filled 2024-07-09 (×5): qty 1

## 2024-07-09 MED ORDER — CLONIDINE HCL 0.1 MG PO TABS: 0.1000 mg | ORAL_TABLET | Freq: Every day | ORAL | Status: DC

## 2024-07-09 MED ORDER — HYDROXYZINE HCL 25 MG PO TABS
50.0000 mg | ORAL_TABLET | Freq: Once | ORAL | Status: AC
  Administered 2024-07-09: 50 mg via ORAL
  Filled 2024-07-09: qty 2

## 2024-07-09 MED ORDER — CLONIDINE HCL 0.1 MG PO TABS: 0.1000 mg | ORAL_TABLET | ORAL | Status: DC

## 2024-07-09 NOTE — ED Notes (Signed)
 Pt lying down in no acute distress. RR even and unlabored. Environment secured. Will continue to monitor for safety.

## 2024-07-09 NOTE — ED Notes (Signed)
 Pt is sleeping. Respirations are even and labored. Q15 safety checks in place per facility policy.

## 2024-07-09 NOTE — ED Provider Notes (Signed)
 Behavioral Health Progress Note  Date and Time: 07/09/2024 6:54 PM Name: Morgan Donaldson MRN:  [id number]  HPI: Morgan Donaldson is a 43 y.o. female with a history of polysubstance abuse including opioids fentanyl  THC, who presented to the [facility] on 11/11, seeking assistance with detoxing of substances of abuse followed by subsequent referral to rehabilitation, in an effort to regain and maintain her sobriety.  Patient assessment 07/09/2024: During encounter with patient, she reports a difficult night, where she toss and turned, had difficulty sleeping due to withdrawal symptoms: She reports generalized body aches and pains, breaking out in sweats, anxiety, restlessness, patient reports that she had been using crack/cocaine. Educated that her toxicology screen was positive for Morphine  as well, and pt shares that she does not use this. Patient is however started on a clonidine taper for opioid use disorder.   Pt with flat affect and depressed mood, attention to personal hygiene and grooming is fair, eye contact is good, speech is clear & coherent. Thought contents are organized and logical, and pt currently denies SI/HI/AVH or paranoia. There is no evidence of delusional thoughts.    Labs reviewed: Ordered Macrobid every 12 hours x 7 days for UTI.  TSH is low at 0.112, ordered free T3 and free T4 for tomorrow morning.  Repeated LFTs, but this seems to be a chronic elevation, will order hepatitis panel, to ascertain if there is an underlying liver disease.  Repeating CBC, due to elevations.  Other vitamin D, B12.  Recommendations: Recommendations made for the [facility] for continuity of substance abuse treatment.  Patient will benefit from being at a higher level of care which is the facility based crisis treatment center, where she will benefit from treatment and stabilization, and subsequent referral to a rehabilitation for substance abuse.  Diagnosis:  Final diagnoses:   Polysubstance abuse (HCC)   Total Time spent with patient: 45 minutes   Pain Medications: See MAR Prescriptions: See MAR Over the Counter: See MAR History of alcohol / drug use?: Yes Longest period of sobriety (when/how long): 15 years per chart review Negative Consequences of Use: Financial Withdrawal Symptoms: Fever / Chills, Nausea / Vomiting, Sweats Name of Substance 1: Fentanyl  1 - Age of First Use: 20s 1 - Amount (size/oz): 1/2 gram 1 - Frequency: daily 1 - Duration: 1 yr 1 - Last Use / Amount: last night - 1/2 g 1 - Method of Aquiring: does not share 1- Route of Use: IV use Name of Substance 2: Cocaine 2 - Age of First Use: 42 2 - Amount (size/oz): less than 1/2 gram 2 - Frequency: daily 2 - Duration: 1 yr 2 - Last Use / Amount: last night - less than 1/2 g 2 - Method of Aquiring: does not share 2 - Route of Substance Use: smokes                Sleep: Fair  Appetite:  Fair  Current Medications:  Current Facility-Administered Medications  Medication Dose Route Frequency Provider Last Rate Last Admin   acetaminophen  (TYLENOL ) tablet 650 mg  650 mg Oral Q6H PRN Randall Starlyn HERO, NP   650 mg at 07/09/24 1018   alum & mag hydroxide-simeth (MAALOX/MYLANTA) 200-200-20 MG/5ML suspension 30 mL  30 mL Oral Q4H PRN Randall, Veronique M, NP       cloNIDine (CATAPRES) tablet 0.1 mg  0.1 mg Oral QID Tex Drilling, NP   0.1 mg at 07/09/24 1412   Followed by   [  START ON 07/11/2024] cloNIDine (CATAPRES) tablet 0.1 mg  0.1 mg Oral BH-qamhs Jeremy Mclamb, NP       Followed by   NOREEN ON 07/13/2024] cloNIDine (CATAPRES) tablet 0.1 mg  0.1 mg Oral QAC breakfast Miel Wisener, NP       dicyclomine (BENTYL) tablet 20 mg  20 mg Oral Q6H PRN Randall, Veronique M, NP       haloperidol  (HALDOL ) tablet 5 mg  5 mg Oral TID PRN Randall Starlyn HERO, NP       And   diphenhydrAMINE  (BENADRYL ) capsule 50 mg  50 mg Oral TID PRN Randall Starlyn HERO, NP       haloperidol   lactate (HALDOL ) injection 5 mg  5 mg Intramuscular TID PRN Randall Starlyn HERO, NP       And   diphenhydrAMINE  (BENADRYL ) injection 50 mg  50 mg Intramuscular TID PRN Randall Starlyn HERO, NP       And   LORazepam  (ATIVAN ) injection 2 mg  2 mg Intramuscular TID PRN Randall Starlyn HERO, NP       haloperidol  lactate (HALDOL ) injection 10 mg  10 mg Intramuscular TID PRN Randall Starlyn HERO, NP       And   diphenhydrAMINE  (BENADRYL ) injection 50 mg  50 mg Intramuscular TID PRN Randall Starlyn HERO, NP       And   LORazepam  (ATIVAN ) injection 2 mg  2 mg Intramuscular TID PRN Randall Starlyn HERO, NP       hydrOXYzine  (ATARAX ) tablet 25 mg  25 mg Oral Q6H PRN Randall, Veronique M, NP       loperamide  (IMODIUM ) capsule 2-4 mg  2-4 mg Oral PRN Randall, Veronique M, NP       loperamide  (IMODIUM ) capsule 2-4 mg  2-4 mg Oral PRN Tex Drilling, NP       magnesium  hydroxide (MILK OF MAGNESIA) suspension 30 mL  30 mL Oral Daily PRN Randall, Veronique M, NP       methocarbamol  (ROBAXIN ) tablet 500 mg  500 mg Oral Q8H PRN Randall, Veronique M, NP       naproxen  (NAPROSYN ) tablet 500 mg  500 mg Oral BID PRN Byungura, Veronique M, NP       nicotine  (NICODERM CQ  - dosed in mg/24 hours) patch 21 mg  21 mg Transdermal Daily Cherylee Rawlinson, NP   21 mg at 07/09/24 1619   nitrofurantoin (macrocrystal-monohydrate) (MACROBID) capsule 100 mg  100 mg Oral Q12H Reuven Braver, NP   100 mg at 07/09/24 1019   ondansetron  (ZOFRAN -ODT) disintegrating tablet 4 mg  4 mg Oral Q6H PRN Randall Starlyn HERO, NP   4 mg at 07/09/24 1020   traZODone  (DESYREL ) tablet 100 mg  100 mg Oral QHS PRN Tex Drilling, NP       No current outpatient medications on file.    Labs  Lab Results:  Admission on 07/08/2024  Component Date Value Ref Range Status   WBC 07/08/2024 10.6 (H)  4.0 - 10.5 K/uL Final   RBC 07/08/2024 5.65 (H)  3.87 - 5.11 MIL/uL Final   Hemoglobin 07/08/2024 16.5 (H)  12.0 - 15.0 g/dL Final   HCT  88/88/7974 49.4 (H)  36.0 - 46.0 % Final   MCV 07/08/2024 87.4  80.0 - 100.0 fL Final   MCH 07/08/2024 29.2  26.0 - 34.0 pg Final   MCHC 07/08/2024 33.4  30.0 - 36.0 g/dL Final   RDW 88/88/7974 13.7  11.5 - 15.5 % Final   Platelets 07/08/2024 422 (  H)  150 - 400 K/uL Final   nRBC 07/08/2024 0.0  0.0 - 0.2 % Final   Neutrophils Relative % 07/08/2024 75  % Final   Neutro Abs 07/08/2024 7.9 (H)  1.7 - 7.7 K/uL Final   Lymphocytes Relative 07/08/2024 17  % Final   Lymphs Abs 07/08/2024 1.8  0.7 - 4.0 K/uL Final   Monocytes Relative 07/08/2024 7  % Final   Monocytes Absolute 07/08/2024 0.7  0.1 - 1.0 K/uL Final   Eosinophils Relative 07/08/2024 0  % Final   Eosinophils Absolute 07/08/2024 0.0  0.0 - 0.5 K/uL Final   Basophils Relative 07/08/2024 1  % Final   Basophils Absolute 07/08/2024 0.1  0.0 - 0.1 K/uL Final   Immature Granulocytes 07/08/2024 0  % Final   Abs Immature Granulocytes 07/08/2024 0.04  0.00 - 0.07 K/uL Final   Performed at [facility], [address], [place], [place] [address]   Sodium 07/08/2024 140  135 - 145 mmol/L Final   Potassium 07/08/2024 4.1  3.5 - 5.1 mmol/L Final   Chloride 07/08/2024 100  98 - 111 mmol/L Final   CO2 07/08/2024 27  22 - 32 mmol/L Final   Glucose, Bld 07/08/2024 113 (H)  70 - 99 mg/dL Final   Glucose reference range applies only to samples taken after fasting for at least 8 hours.   BUN 07/08/2024 10  6 - 20 mg/dL Final   Creatinine, Ser 07/08/2024 0.74  0.44 - 1.00 mg/dL Final   Calcium 88/88/7974 9.9  8.9 - 10.3 mg/dL Final   Total Protein 88/88/7974 7.9  6.5 - 8.1 g/dL Final   Albumin 88/88/7974 3.5  3.5 - 5.0 g/dL Final   AST 88/88/7974 109 (H)  15 - 41 U/L Final   ALT 07/08/2024 132 (H)  0 - 44 U/L Final   Alkaline Phosphatase 07/08/2024 74  38 - 126 U/L Final   Total Bilirubin 07/08/2024 0.3  0.0 - 1.2 mg/dL Final   GFR, Estimated 07/08/2024 >60  >60 mL/min Final   Comment: (NOTE) Calculated using the CKD-EPI Creatinine Equation  (2021)    Anion gap 07/08/2024 13  5 - 15 Final   Performed at [facility], [address] [place], [place] [address]   Hgb A1c MFr Bld 07/08/2024 5.5  4.8 - 5.6 % Final   Comment: (NOTE) Diagnosis of Diabetes The following HbA1c ranges recommended by the American Diabetes Association (ADA) may be used as an aid in the diagnosis of diabetes mellitus.  Hemoglobin             Suggested A1C NGSP%              Diagnosis  <5.7                   Non Diabetic  5.7-6.4                Pre-Diabetic  >6.4                   Diabetic  <7.0                   Glycemic control for                       adults with diabetes.     Mean Plasma Glucose 07/08/2024 111.15  mg/dL Final   Performed at [facility], [address], [place], [place]  [address]   Magnesium  07/08/2024 2.1  1.7 - 2.4 mg/dL Final   Performed at [facility], [address] [place], [place] [address]   Alcohol, Ethyl (B) 07/08/2024 <15  <15 mg/dL Final   Comment: (NOTE) For medical purposes only. Performed at [facility], [address] [place], [place] [address]    Cholesterol 07/08/2024 174  0 - 200 mg/dL Final   Triglycerides 88/88/7974 102  <150 mg/dL Final   HDL 88/88/7974 42  >40 mg/dL Final   Total CHOL/HDL Ratio 07/08/2024 4.1  RATIO Final   VLDL 07/08/2024 20  0 - 40 mg/dL Final   LDL Cholesterol 07/08/2024 112 (H)  0 - 99 mg/dL Final   Comment:        Total Cholesterol/HDL:CHD Risk Coronary Heart Disease Risk Table                     Men   Women  1/2 Average Risk   3.4   3.3  Average Risk       5.0   4.4  2 X Average Risk   9.6   7.1  3 X Average Risk  23.4   11.0        Use the calculated Patient Ratio above and the CHD Risk Table to determine the patient's CHD Risk.        ATP III CLASSIFICATION (LDL):  <100     mg/dL   Optimal  899-870  mg/dL   Near or Above                    Optimal  130-159  mg/dL   Borderline  839-810  mg/dL   High  >809     mg/dL   Very  High Performed at [facility], [address] [place], [place] [address]    Color, Urine 07/09/2024 YELLOW  YELLOW Final   APPearance 07/09/2024 TURBID (A)  CLEAR Final   Specific Gravity, Urine 07/09/2024 1.017  1.005 - 1.030 Final   pH 07/09/2024 7.0  5.0 - 8.0 Final   Glucose, UA 07/09/2024 NEGATIVE  NEGATIVE mg/dL Final   Hgb urine dipstick 07/09/2024 NEGATIVE  NEGATIVE Final   Bilirubin Urine 07/09/2024 NEGATIVE  NEGATIVE Final   Ketones, ur 07/09/2024 NEGATIVE  NEGATIVE mg/dL Final   Protein, ur 88/87/7974 NEGATIVE  NEGATIVE mg/dL Final   Nitrite 88/87/7974 NEGATIVE  NEGATIVE Final   Leukocytes,Ua 07/09/2024 MODERATE (A)  NEGATIVE Final   RBC / HPF 07/09/2024 0-5  0 - 5 RBC/hpf Final   WBC, UA 07/09/2024 21-50  0 - 5 WBC/hpf Final   Bacteria, UA 07/09/2024 MANY (A)  NONE SEEN Final   Squamous Epithelial / HPF 07/09/2024 0-5  0 - 5 /HPF Final   WBC Clumps 07/09/2024 PRESENT   Final   Performed at [facility], [address] [place], [place] [address]   Preg Test, Ur 07/09/2024 Negative  Negative Final   POC Amphetamine  UR 07/09/2024 None Detected  NONE DETECTED (Cut Off Level 1000 ng/mL) Final   POC Secobarbital (BAR) 07/09/2024 None Detected  NONE DETECTED (Cut Off Level 300 ng/mL) Final   POC Buprenorphine (BUP) 07/09/2024 None Detected  NONE DETECTED (Cut Off Level 10 ng/mL) Final   POC Oxazepam (BZO) 07/09/2024 None Detected  NONE DETECTED (Cut Off Level 300 ng/mL) Final   POC Cocaine UR 07/09/2024 Positive (A)  NONE DETECTED (Cut Off Level 300 ng/mL) Final  POC Methamphetamine UR 07/09/2024 None Detected  NONE DETECTED (Cut Off Level 1000 ng/mL) Final   POC Morphine  07/09/2024 Positive (A)  NONE DETECTED (Cut Off Level 300 ng/mL) Final   POC Methadone UR 07/09/2024 None Detected  NONE DETECTED (Cut Off Level 300 ng/mL) Final   POC Oxycodone  UR 07/09/2024 None Detected  NONE DETECTED (Cut Off Level 100 ng/mL) Final   POC Marijuana UR 07/09/2024 Positive (A)   NONE DETECTED (Cut Off Level 50 ng/mL) Final   TSH 07/08/2024 0.112 (L)  0.350 - 4.500 uIU/mL Final   Comment: Performed by a 3rd Generation assay with a functional sensitivity of <=0.01 uIU/mL. Performed at [facility], [address] [place], [place] [address]    Preg Test, Ur 07/09/2024 NEGATIVE  NEGATIVE Final   Comment:        THE SENSITIVITY OF THIS METHODOLOGY IS >20 mIU/mL.   Admission on 05/17/2024, Discharged on 05/18/2024  Component Date Value Ref Range Status   WBC 05/17/2024 9.0  4.0 - 10.5 K/uL Final   RBC 05/17/2024 4.35  3.87 - 5.11 MIL/uL Final   Hemoglobin 05/17/2024 12.8  12.0 - 15.0 g/dL Final   HCT 90/79/7974 39.7  36.0 - 46.0 % Final   MCV 05/17/2024 91.3  80.0 - 100.0 fL Final   MCH 05/17/2024 29.4  26.0 - 34.0 pg Final   MCHC 05/17/2024 32.2  30.0 - 36.0 g/dL Final   RDW 90/79/7974 14.1  11.5 - 15.5 % Final   Platelets 05/17/2024 286  150 - 400 K/uL Final   nRBC 05/17/2024 0.0  0.0 - 0.2 % Final   Neutrophils Relative % 05/17/2024 65  % Final   Neutro Abs 05/17/2024 5.8  1.7 - 7.7 K/uL Final   Lymphocytes Relative 05/17/2024 23  % Final   Lymphs Abs 05/17/2024 2.1  0.7 - 4.0 K/uL Final   Monocytes Relative 05/17/2024 8  % Final   Monocytes Absolute 05/17/2024 0.8  0.1 - 1.0 K/uL Final   Eosinophils Relative 05/17/2024 4  % Final   Eosinophils Absolute 05/17/2024 0.3  0.0 - 0.5 K/uL Final   Basophils Relative 05/17/2024 0  % Final   Basophils Absolute 05/17/2024 0.0  0.0 - 0.1 K/uL Final   Immature Granulocytes 05/17/2024 0  % Final   Abs Immature Granulocytes 05/17/2024 0.04  0.00 - 0.07 K/uL Final   Performed at [facility], [address] [place], [place] [address]   Preg, Serum 05/17/2024 NEGATIVE  NEGATIVE Final   Comment:        THE SENSITIVITY OF THIS METHODOLOGY IS >10 mIU/mL. Performed at [facility], [address], [place], [place] [address]    Sodium 05/17/2024 141  135 - 145 mmol/L Final   Potassium 05/17/2024 3.5   3.5 - 5.1 mmol/L Final   Chloride 05/17/2024 104  98 - 111 mmol/L Final   CO2 05/17/2024 26  22 - 32 mmol/L Final   Glucose, Bld 05/17/2024 118 (H)  70 - 99 mg/dL Final   Glucose reference range applies only to samples taken after fasting for at least 8 hours.   BUN 05/17/2024 11  6 - 20 mg/dL Final   Creatinine, Ser 05/17/2024 0.78  0.44 - 1.00 mg/dL Final   Calcium 90/79/7974 8.8 (L)  8.9 - 10.3 mg/dL Final   Total Protein 90/79/7974 7.0  6.5 - 8.1 g/dL Final   Albumin 90/79/7974 3.0 (L)  3.5 - 5.0 g/dL Final   AST 90/79/7974  115 (H)  15 - 41 U/L Final   ALT 05/17/2024 144 (H)  0 - 44 U/L Final   Alkaline Phosphatase 05/17/2024 71  38 - 126 U/L Final   Total Bilirubin 05/17/2024 0.5  0.0 - 1.2 mg/dL Final   GFR, Estimated 05/17/2024 >60  >60 mL/min Final   Comment: (NOTE) Calculated using the CKD-EPI Creatinine Equation (2021)    Anion gap 05/17/2024 11  5 - 15 Final   Performed at [facility], [address], [place], [place] [address]   Sodium 05/17/2024 142  135 - 145 mmol/L Final   Potassium 05/17/2024 3.5  3.5 - 5.1 mmol/L Final   Chloride 05/17/2024 103  98 - 111 mmol/L Final   BUN 05/17/2024 12  6 - 20 mg/dL Final   Creatinine, Ser 05/17/2024 0.70  0.44 - 1.00 mg/dL Final   Glucose, Bld 90/79/7974 120 (H)  70 - 99 mg/dL Final   Glucose reference range applies only to samples taken after fasting for at least 8 hours.   Calcium, Ion 05/17/2024 1.13 (L)  1.15 - 1.40 mmol/L Final   TCO2 05/17/2024 28  22 - 32 mmol/L Final   Hemoglobin 05/17/2024 11.6 (L)  12.0 - 15.0 g/dL Final   HCT 90/79/7974 34.0 (L)  36.0 - 46.0 % Final  Admission on 03/25/2024, Discharged on 03/25/2024  Component Date Value Ref Range Status   Sodium 03/25/2024 140  135 - 145 mmol/L Final   Potassium 03/25/2024 3.2 (L)  3.5 - 5.1 mmol/L Final   Chloride 03/25/2024 105  98 - 111 mmol/L Final   BUN 03/25/2024 9  6 - 20 mg/dL Final   Creatinine, Ser 03/25/2024 0.70  0.44 - 1.00 mg/dL Final    Glucose, Bld 92/70/7974 132 (H)  70 - 99 mg/dL Final   Glucose reference range applies only to samples taken after fasting for at least 8 hours.   Calcium, Ion 03/25/2024 0.99 (L)  1.15 - 1.40 mmol/L Final   TCO2 03/25/2024 24  22 - 32 mmol/L Final   Hemoglobin 03/25/2024 12.9  12.0 - 15.0 g/dL Final   HCT 92/70/7974 38.0  36.0 - 46.0 % Final   Lactic Acid, Venous 03/25/2024 3.2 (HH)  0.5 - 1.9 mmol/L Final   Comment 03/25/2024 NOTIFIED PHYSICIAN   Final  Admission on 02/10/2024, Discharged on 02/12/2024  Component Date Value Ref Range Status   WBC 02/10/2024 16.8 (H)  4.0 - 10.5 K/uL Final   RBC 02/10/2024 4.46  3.87 - 5.11 MIL/uL Final   Hemoglobin 02/10/2024 12.7  12.0 - 15.0 g/dL Final   HCT 93/84/7974 39.5  36.0 - 46.0 % Final   MCV 02/10/2024 88.6  80.0 - 100.0 fL Final   MCH 02/10/2024 28.5  26.0 - 34.0 pg Final   MCHC 02/10/2024 32.2  30.0 - 36.0 g/dL Final   RDW 93/84/7974 14.8  11.5 - 15.5 % Final   Platelets 02/10/2024 331  150 - 400 K/uL Final   nRBC 02/10/2024 0.0  0.0 - 0.2 % Final   Neutrophils Relative % 02/10/2024 83  % Final   Neutro Abs 02/10/2024 14.0 (H)  1.7 - 7.7 K/uL Final   Lymphocytes Relative 02/10/2024 9  % Final   Lymphs Abs 02/10/2024 1.6  0.7 - 4.0 K/uL Final   Monocytes Relative 02/10/2024 6  % Final   Monocytes Absolute 02/10/2024 1.0  0.1 - 1.0 K/uL Final   Eosinophils Relative 02/10/2024 1  % Final   Eosinophils Absolute  02/10/2024 0.1  0.0 - 0.5 K/uL Final   Basophils Relative 02/10/2024 0  % Final   Basophils Absolute 02/10/2024 0.1  0.0 - 0.1 K/uL Final   Immature Granulocytes 02/10/2024 1  % Final   Abs Immature Granulocytes 02/10/2024 0.08 (H)  0.00 - 0.07 K/uL Final   Performed at [facility], [address], [place], [place] [address]   Sodium 02/10/2024 136  135 - 145 mmol/L Final   Potassium 02/10/2024 3.9  3.5 - 5.1 mmol/L Final   Chloride 02/10/2024 101  98 - 111 mmol/L Final   CO2 02/10/2024 25  22 - 32 mmol/L Final   Glucose,  Bld 02/10/2024 94  70 - 99 mg/dL Final   Glucose reference range applies only to samples taken after fasting for at least 8 hours.   BUN 02/10/2024 7  6 - 20 mg/dL Final   Creatinine, Ser 02/10/2024 0.66  0.44 - 1.00 mg/dL Final   Calcium 93/84/7974 8.7 (L)  8.9 - 10.3 mg/dL Final   Total Protein 93/84/7974 6.8  6.5 - 8.1 g/dL Final   Albumin 93/84/7974 3.2 (L)  3.5 - 5.0 g/dL Final   AST 93/84/7974 215 (H)  15 - 41 U/L Final   ALT 02/10/2024 143 (H)  0 - 44 U/L Final   Alkaline Phosphatase 02/10/2024 82  38 - 126 U/L Final   Total Bilirubin 02/10/2024 1.0  0.0 - 1.2 mg/dL Final   GFR, Estimated 02/10/2024 >60  >60 mL/min Final   Comment: (NOTE) Calculated using the CKD-EPI Creatinine Equation (2021)    Anion gap 02/10/2024 10  5 - 15 Final   Performed at [facility], [address] [place], [place] [address]   Lipase 02/10/2024 25  11 - 51 U/L Final   Performed at [facility], [address], [place], [place] [address]   Specimen Source 02/10/2024 URINE, CLEAN CATCH   Final   Color, Urine 02/10/2024 YELLOW  YELLOW Final   APPearance 02/10/2024 CLOUDY (A)  CLEAR Final   Specific Gravity, Urine 02/10/2024 1.015  1.005 - 1.030 Final   pH 02/10/2024 7.0  5.0 - 8.0 Final   Glucose, UA 02/10/2024 NEGATIVE  NEGATIVE mg/dL Final   Hgb urine dipstick 02/10/2024 NEGATIVE  NEGATIVE Final   Bilirubin Urine 02/10/2024 NEGATIVE  NEGATIVE Final   Ketones, ur 02/10/2024 NEGATIVE  NEGATIVE mg/dL Final   Protein, ur 93/84/7974 30 (A)  NEGATIVE mg/dL Final   Nitrite 93/84/7974 POSITIVE (A)  NEGATIVE Final   Leukocytes,Ua 02/10/2024 MODERATE (A)  NEGATIVE Final   RBC / HPF 02/10/2024 0-5  0 - 5 RBC/hpf Final   WBC, UA 02/10/2024 21-50  0 - 5 WBC/hpf Final   Comment:        Reflex urine culture not performed if WBC <=10, OR if Squamous epithelial cells >5. If Squamous epithelial cells >5 suggest recollection.    Bacteria, UA 02/10/2024 FEW (A)  NONE SEEN Final   Squamous Epithelial  / HPF 02/10/2024 0-5  0 - 5 /HPF Final   Mucus 02/10/2024 PRESENT   Final   Amorphous Crystal 02/10/2024 PRESENT   Final   Performed at [facility], [address], [place], [place] [address]   Preg Test, Ur 02/10/2024 NEGATIVE  NEGATIVE Final   Comment:        THE SENSITIVITY OF THIS METHODOLOGY IS >25 mIU/mL. Performed at [facility], [address] [place], [place] [address]  Specimen Description 02/10/2024 BLOOD RIGHT FOREARM   Final   Special Requests 02/10/2024 BOTTLES DRAWN AEROBIC AND ANAEROBIC Blood Culture adequate volume   Final   Culture 02/10/2024    Final                   Value:NO GROWTH 5 DAYS Performed at [facility], [address] [place], [place] [address]    Report Status 02/10/2024 02/15/2024 FINAL   Final   Specimen Description 02/10/2024 BLOOD LEFT ANTECUBITAL   Final   Special Requests 02/10/2024 BOTTLES DRAWN AEROBIC AND ANAEROBIC Blood Culture results may not be optimal due to an inadequate volume of blood received in culture bottles   Final   Culture 02/10/2024    Final                   Value:NO GROWTH 5 DAYS Performed at [facility], [address], [place], [place] [address]    Report Status 02/10/2024 02/15/2024 FINAL   Final   Neisseria Gonorrhea 02/10/2024 Positive (A)   Final   Chlamydia 02/10/2024 Negative   Final   Comment 02/10/2024 Normal Reference Ranger Chlamydia - Negative   Final   Comment 02/10/2024 Normal Reference Range Neisseria Gonorrhea - Negative   Final   Yeast Wet Prep HPF POC 02/10/2024 NONE SEEN  NONE SEEN Final   Trich, Wet Prep 02/10/2024 NONE SEEN  NONE SEEN Final   Clue Cells Wet Prep HPF POC 02/10/2024 NONE SEEN  NONE SEEN Final   WBC, Wet Prep HPF POC 02/10/2024 >=10 (A)  <10 Final   Sperm 02/10/2024 NONE SEEN   Final   Performed at [facility], [address] [place], [place] [address]   HIV Screen 4th Generation wRfx 02/10/2024 Non Reactive  Non Reactive Final   Performed at [facility], [address], [place], [place] [address]   RPR Ser Ql 02/10/2024 NON REACTIVE  NON REACTIVE Final   Performed at [facility], [address], [place], [place] [address]   Hepatitis B Surface Ag 02/10/2024 NON REACTIVE  NON REACTIVE Final   HCV Ab 02/10/2024 Reactive (A)  NON REACTIVE Final   Comment: (NOTE) The CDC recommends that a Reactive HCV antibody result be followed up  with a HCV Nucleic Acid Amplification test.     Hep A IgM 02/10/2024 NON REACTIVE  NON REACTIVE Final   Hep B C IgM 02/10/2024 NON REACTIVE  NON REACTIVE Final   Performed at [facility], [address], [place], [place] [address]   Specimen Description 02/10/2024 URINE, RANDOM   Final   Special Requests 02/10/2024    Final                   Value:NONE Reflexed from [id number] Performed at [facility], [address] [place], [place] [address]    Culture 02/10/2024 MULTIPLE SPECIES PRESENT, SUGGEST RECOLLECTION (A)   Final   Report Status 02/10/2024 02/11/2024 FINAL   Final   WBC 02/11/2024 17.9 (H)  4.0 - 10.5 K/uL Final   RBC 02/11/2024 4.32  3.87 - 5.11 MIL/uL Final   Hemoglobin 02/11/2024 12.4  12.0 - 15.0 g/dL Final   HCT 93/83/7974 38.1  36.0 - 46.0 % Final   MCV 02/11/2024 88.2  80.0 - 100.0 fL Final   MCH 02/11/2024 28.7  26.0 - 34.0 pg Final   MCHC 02/11/2024 32.5  30.0 - 36.0  g/dL Final   RDW 93/83/7974 14.9  11.5 - 15.5 % Final   Platelets 02/11/2024 328  150 - 400 K/uL Final   nRBC 02/11/2024 0.0  0.0 - 0.2 % Final   Performed at [facility], [address], [place], [place] [address]   Sodium 02/11/2024 134 (L)  135 - 145 mmol/L Final   Potassium 02/11/2024 4.1  3.5 - 5.1 mmol/L Final   Chloride 02/11/2024 103  98 - 111 mmol/L Final   CO2 02/11/2024 23  22 - 32 mmol/L Final   Glucose, Bld 02/11/2024 245 (H)  70 - 99 mg/dL Final   Glucose reference range applies only to samples taken after fasting for at least 8 hours.   BUN 02/11/2024 9  6 - 20 mg/dL  Final   Creatinine, Ser 02/11/2024 0.76  0.44 - 1.00 mg/dL Final   Calcium 93/83/7974 7.9 (L)  8.9 - 10.3 mg/dL Final   Total Protein 93/83/7974 5.7 (L)  6.5 - 8.1 g/dL Final   Albumin 93/83/7974 2.3 (L)  3.5 - 5.0 g/dL Final   AST 93/83/7974 109 (H)  15 - 41 U/L Final   ALT 02/11/2024 95 (H)  0 - 44 U/L Final   Alkaline Phosphatase 02/11/2024 72  38 - 126 U/L Final   Total Bilirubin 02/11/2024 0.7  0.0 - 1.2 mg/dL Final   GFR, Estimated 02/11/2024 >60  >60 mL/min Final   Comment: (NOTE) Calculated using the CKD-EPI Creatinine Equation (2021)    Anion gap 02/11/2024 8  5 - 15 Final   Performed at [facility], [address], [place], [place] [address]   HCV RNA Qnt(log copy/mL) 02/11/2024 6.778  log10 IU/mL Corrected   HepC Qn 02/11/2024 6,000,000  IU/mL Final   Test Information 02/11/2024 Comment   Final   Comment: (NOTE) The quantitative range of this assay is 15 IU/mL to 100 million IU/mL.    Hcv Genotype 02/11/2024 Comment   Final   Comment: (NOTE) To be performed on this specimen. Performed At: [facility] [address] [place], [place] [id number] Jennette Shorter MD Ey:[id number]    aPTT 02/11/2024 37 (H)  24 - 36 seconds Final   Comment:        IF BASELINE aPTT IS ELEVATED, SUGGEST PATIENT RISK ASSESSMENT BE USED TO DETERMINE APPROPRIATE ANTICOAGULANT THERAPY. Performed at [facility], [address] [place], [place] [address]    Prothrombin Time 02/11/2024 15.4 (H)  11.4 - 15.2 seconds Final   INR 02/11/2024 1.2  0.8 - 1.2 Final   Comment: (NOTE) INR goal varies based on device and disease states. Performed at [facility], [address] [place], [place] [address]    Fibrinogen  02/11/2024 524 (H)  210 - 475 mg/dL Final   Comment: (NOTE) Fibrinogen  results may be underestimated in patients receiving thrombolytic therapy. Performed at [facility], [address], [place], [place] [address]    TSH 02/11/2024 0.373  0.350 -  4.500 uIU/mL Final   Comment: Performed by a 3rd Generation assay with a functional sensitivity of <=0.01 uIU/mL. Performed at [facility], [address], [place], [place] [address]    WBC 02/12/2024 9.0  4.0 - 10.5 K/uL Final   RBC 02/12/2024 4.32  3.87 - 5.11 MIL/uL Final   Hemoglobin 02/12/2024 12.6  12.0 - 15.0 g/dL Final   HCT 93/82/7974 38.4  36.0 - 46.0 % Final   MCV 02/12/2024 88.9  80.0 -  100.0 fL Final   MCH 02/12/2024 29.2  26.0 - 34.0 pg Final   MCHC 02/12/2024 32.8  30.0 - 36.0 g/dL Final   RDW 93/82/7974 14.9  11.5 - 15.5 % Final   Platelets 02/12/2024 340  150 - 400 K/uL Final   nRBC 02/12/2024 0.0  0.0 - 0.2 % Final   Performed at [facility], [address], [place], [place] [address]   Sodium 02/12/2024 138  135 - 145 mmol/L Final   Potassium 02/12/2024 3.9  3.5 - 5.1 mmol/L Final   Chloride 02/12/2024 106  98 - 111 mmol/L Final   CO2 02/12/2024 22  22 - 32 mmol/L Final   Glucose, Bld 02/12/2024 147 (H)  70 - 99 mg/dL Final   Glucose reference range applies only to samples taken after fasting for at least 8 hours.   BUN 02/12/2024 10  6 - 20 mg/dL Final   Creatinine, Ser 02/12/2024 0.66  0.44 - 1.00 mg/dL Final   Calcium 93/82/7974 8.2 (L)  8.9 - 10.3 mg/dL Final   Total Protein 93/82/7974 5.9 (L)  6.5 - 8.1 g/dL Final   Albumin 93/82/7974 2.2 (L)  3.5 - 5.0 g/dL Final   AST 93/82/7974 133 (H)  15 - 41 U/L Final   ALT 02/12/2024 93 (H)  0 - 44 U/L Final   Alkaline Phosphatase 02/12/2024 63  38 - 126 U/L Final   Total Bilirubin 02/12/2024 0.4  0.0 - 1.2 mg/dL Final   GFR, Estimated 02/12/2024 >60  >60 mL/min Final   Comment: (NOTE) Calculated using the CKD-EPI Creatinine Equation (2021)    Anion gap 02/12/2024 10  5 - 15 Final   Performed at [facility], [address], [place], [place] [address]   Hgb A1c MFr Bld 02/12/2024 5.4  4.8 - 5.6 % Final   Comment: (NOTE) Diagnosis of Diabetes The following HbA1c ranges recommended by the  American Diabetes Association (ADA) may be used as an aid in the diagnosis of diabetes mellitus.  Hemoglobin             Suggested A1C NGSP%              Diagnosis  <5.7                   Non Diabetic  5.7-6.4                Pre-Diabetic  >6.4                   Diabetic  <7.0                   Glycemic control for                       adults with diabetes.     Mean Plasma Glucose 02/12/2024 108.28  mg/dL Final   Performed at [facility], [address] [place], [place] [address]   Hepatitis C Genotype 02/11/2024 1a   Final   Comment: (NOTE) This test was developed and its performance characteristics determined by [facility]. It has not been cleared or approved by the Food and Drug Administration. Performed At: [facility] [address] [place], [place] [id number] Jennette Shorter MD Ey:[id number]     Blood Alcohol level:  Lab Results  Component Value Date   [facility] <15 07/08/2024    Metabolic Disorder Labs: Lab Results  Component Value Date  HGBA1C 5.5 07/08/2024   MPG 111.15 07/08/2024   MPG 108.28 02/12/2024   No results found for: PROLACTIN Lab Results  Component Value Date   CHOL 174 07/08/2024   TRIG 102 07/08/2024   HDL 42 07/08/2024   CHOLHDL 4.1 07/08/2024   VLDL 20 07/08/2024   LDLCALC 112 (H) 07/08/2024   LDLCALC 73 09/22/2020    Therapeutic Lab Levels: No results found for: LITHIUM No results found for: VALPROATE No results found for: CBMZ  Physical Findings   GAD-7    Flowsheet Row Procedure visit from 11/01/2020 in Center for [facility] Healthcare at [facility] Office Visit from 09/22/2020 in Center for [employer] at [facility] Clinical Support from 06/21/2020 in Center for [employer] at [facility] Clinical Support from 04/05/2020 in [place] for [employer] at [facility] Clinical Support from 01/19/2020 in Center for [employer]  Healthcare at [facility]  Total GAD-7 Score 7 2 10 8  0   PHQ2-9    Flowsheet Row ED from 07/08/2024 in [place] Procedure visit from 11/01/2020 in Center for [employer] at [facility] Office Visit from 09/22/2020 in Center for [employer] at [facility] Clinical Support from 06/21/2020 in [place] for [employer] at [facility] Clinical Support from 04/05/2020 in Center for [employer] at [facility]  PHQ-2 Total Score 4 0 0 2 0  PHQ-9 Total Score 12 0 2 10 1    Flowsheet Row ED from 07/08/2024 in [facility] ED from 05/17/2024 in [facility] Emergency Department at [facility] ED from 03/25/2024 in [facility] Emergency Department at [facility]  C-SSRS RISK CATEGORY No Risk No Risk No Risk    Musculoskeletal  Strength & Muscle Tone: within normal limits Gait & Station: normal Patient leans: N/A  Psychiatric Specialty Exam  Presentation  General Appearance:  Well Groomed  Eye Contact: Fair  Speech: Clear and Coherent  Speech Volume: Normal  Handedness: Right  Mood and Affect  Mood: Depressed; Anxious  Affect: Congruent  Thought Process  Thought Processes: Coherent  Descriptions of Associations:Intact  Orientation:Full (Time, Place and Person)  Thought Content:Logical; WDL  Diagnosis of Schizophrenia or Schizoaffective disorder in past: No    Hallucinations:Hallucinations: None  Ideas of Reference:None  Suicidal Thoughts:Suicidal Thoughts: No  Homicidal Thoughts:Homicidal Thoughts: No   Sensorium  Memory: Immediate Fair  Judgment: Fair  Insight: Fair   Art Therapist  Concentration: Fair  Attention Span: Fair  Recall: [employer] of Knowledge: Fair  Language: Fair   Psychomotor Activity  Psychomotor Activity: Psychomotor  Activity: Normal   Assets  Assets: Communication Skills; Desire for Improvement   Sleep  Sleep: Sleep: Poor  No Safety Checks orders active in given range  Nutritional Assessment (For OBS and FBC admissions only) Has the patient had a weight loss or gain of 10 pounds or more in the last 3 months?: No Has the patient had a decrease in food intake/or appetite?: No Does the patient have dental problems?: No Does the patient have eating habits or behaviors that may be indicators of an eating disorder including binging or inducing vomiting?: No Has the patient recently lost weight without trying?: 0 Has the patient been eating poorly because of a decreased appetite?: 0 Malnutrition Screening Tool Score: 0   Physical Exam  Physical Exam Constitutional:      Appearance: Normal appearance.  HENT:     Head: Normocephalic.  Eyes:     Pupils: Pupils are equal, round, and reactive to light.  Pulmonary:     Effort: Pulmonary effort is normal.  Musculoskeletal:     Cervical back: Normal range of motion.  Skin:    General: Skin is warm.  Neurological:     Mental Status: She is alert.    Review of Systems  Constitutional: Negative.   HENT: Negative.    Gastrointestinal: Negative.   Genitourinary: Negative.   Musculoskeletal: Negative.   Skin: Negative.   Psychiatric/Behavioral:  Positive for depression and substance abuse. Negative for hallucinations, memory loss and suicidal ideas. The patient is nervous/anxious and has insomnia.   All other systems reviewed and are negative.  Blood pressure 130/72, pulse 86, temperature 98.1 F (36.7 C), temperature source Oral, resp. rate 18, SpO2 100%. There is no height or weight on file to calculate BMI.  Treatment Plan Summary: -Continue admission to the observation unit, and transferred to the [facility] once a bed opens up. - Continue medications as listed below:  cloNIDine  0.1 mg Oral QID   Followed by   NOREEN ON 07/11/2024] cloNIDine   0.1 mg Oral BH-qamhs   Followed by   NOREEN ON 07/13/2024] cloNIDine  0.1 mg Oral QAC breakfast   nicotine   21 mg Transdermal Daily   nitrofurantoin (macrocrystal-monohydrate)  100 mg Oral Q12H   PRNS -Continue Tylenol  650 mg every 6 hours PRN for mild pain -Continue Maalox 30 mg every 4 hrs PRN for indigestion -Continue Milk of Magnesia as needed every 6 hrs for constipation -Continue other supportive medications as per the [facility]  Discharge Planning: Social work and case management to assist with discharge planning and identification of hospital follow-up needs prior to discharge Estimated LOS: 3-5 days Discharge Concerns: Need to establish a safety plan; Medication compliance and effectiveness Discharge Goals: Coordinate with CSW to facilitate going to rehab for substance abuse   Donia Snell, NP 07/09/2024 6:54 PM

## 2024-07-09 NOTE — ED Notes (Signed)
 Pt sitting in dayroom resting no interaction with peers. No acute distress noted. No concerns voiced. Informed pt to notify staff with any needs or assistance. Pt verbalized understanding and agreement. Will continue to monitor for safety.

## 2024-07-09 NOTE — ED Notes (Signed)
Patient provided snack.

## 2024-07-10 ENCOUNTER — Other Ambulatory Visit (HOSPITAL_COMMUNITY): Admission: EM | Admit: 2024-07-10 | Discharge: 2024-07-22 | Disposition: A | Discharge status: Home / Self Care

## 2024-07-10 DIAGNOSIS — F411 Generalized anxiety disorder: Secondary | ICD-10-CM | POA: Diagnosis not present

## 2024-07-10 DIAGNOSIS — F1924 Other psychoactive substance dependence with psychoactive substance-induced mood disorder: Secondary | ICD-10-CM | POA: Diagnosis not present

## 2024-07-10 DIAGNOSIS — F11220 Opioid dependence with intoxication, uncomplicated: Secondary | ICD-10-CM | POA: Diagnosis not present

## 2024-07-10 DIAGNOSIS — R7989 Other specified abnormal findings of blood chemistry: Secondary | ICD-10-CM | POA: Insufficient documentation

## 2024-07-10 DIAGNOSIS — F1910 Other psychoactive substance abuse, uncomplicated: Secondary | ICD-10-CM | POA: Diagnosis present

## 2024-07-10 DIAGNOSIS — F17210 Nicotine dependence, cigarettes, uncomplicated: Secondary | ICD-10-CM | POA: Insufficient documentation

## 2024-07-10 DIAGNOSIS — Z560 Unemployment, unspecified: Secondary | ICD-10-CM | POA: Insufficient documentation

## 2024-07-10 DIAGNOSIS — F1193 Opioid use, unspecified with withdrawal: Secondary | ICD-10-CM

## 2024-07-10 DIAGNOSIS — F1420 Cocaine dependence, uncomplicated: Secondary | ICD-10-CM | POA: Diagnosis not present

## 2024-07-10 DIAGNOSIS — F319 Bipolar disorder, unspecified: Secondary | ICD-10-CM | POA: Insufficient documentation

## 2024-07-10 DIAGNOSIS — Z5902 Unsheltered homelessness: Secondary | ICD-10-CM | POA: Insufficient documentation

## 2024-07-10 DIAGNOSIS — F1994 Other psychoactive substance use, unspecified with psychoactive substance-induced mood disorder: Secondary | ICD-10-CM

## 2024-07-10 DIAGNOSIS — K5900 Constipation, unspecified: Secondary | ICD-10-CM | POA: Insufficient documentation

## 2024-07-10 DIAGNOSIS — E0590 Thyrotoxicosis, unspecified without thyrotoxic crisis or storm: Secondary | ICD-10-CM | POA: Insufficient documentation

## 2024-07-10 DIAGNOSIS — Z79899 Other long term (current) drug therapy: Secondary | ICD-10-CM | POA: Insufficient documentation

## 2024-07-10 MED ORDER — HYDROXYZINE HCL 25 MG PO TABS
25.0000 mg | ORAL_TABLET | Freq: Four times a day (QID) | ORAL | Status: DC | PRN
  Administered 2024-07-11: 25 mg via ORAL
  Filled 2024-07-10: qty 1

## 2024-07-10 MED ORDER — LORAZEPAM 2 MG/ML IJ SOLN: 2.0000 mg | Freq: Three times a day (TID) | INTRAMUSCULAR | Status: DC | PRN

## 2024-07-10 MED ORDER — CLONIDINE HCL 0.1 MG PO TABS
0.1000 mg | ORAL_TABLET | ORAL | Status: AC
  Administered 2024-07-11 – 2024-07-12 (×4): 0.1 mg via ORAL
  Filled 2024-07-10 (×4): qty 1

## 2024-07-10 MED ORDER — TRAZODONE HCL 100 MG PO TABS
100.0000 mg | ORAL_TABLET | Freq: Every evening | ORAL | Status: DC | PRN
  Administered 2024-07-11 – 2024-07-21 (×11): 100 mg via ORAL
  Filled 2024-07-10 (×4): qty 1
  Filled 2024-07-10: qty 7
  Filled 2024-07-10 (×2): qty 1
  Filled 2024-07-10: qty 7
  Filled 2024-07-10 (×6): qty 1

## 2024-07-10 MED ORDER — ENSURE PLUS HIGH PROTEIN PO LIQD
237.0000 mL | Freq: Two times a day (BID) | ORAL | Status: DC
  Administered 2024-07-11 – 2024-07-12 (×4): 237 mL via ORAL

## 2024-07-10 MED ORDER — HALOPERIDOL LACTATE 5 MG/ML IJ SOLN: 10.0000 mg | Freq: Three times a day (TID) | INTRAMUSCULAR | Status: DC | PRN

## 2024-07-10 MED ORDER — ONDANSETRON HCL 4 MG PO TABS
4.0000 mg | ORAL_TABLET | Freq: Once | ORAL | Status: AC
  Administered 2024-07-10: 4 mg via ORAL
  Filled 2024-07-10: qty 1

## 2024-07-10 MED ORDER — ONDANSETRON 4 MG PO TBDP: 4.0000 mg | ORAL_TABLET | Freq: Four times a day (QID) | ORAL | Status: AC | PRN

## 2024-07-10 MED ORDER — ACETAMINOPHEN 325 MG PO TABS
650.0000 mg | ORAL_TABLET | Freq: Four times a day (QID) | ORAL | Status: DC | PRN
  Administered 2024-07-11 – 2024-07-13 (×2): 650 mg via ORAL
  Filled 2024-07-10 (×2): qty 2

## 2024-07-10 MED ORDER — DICYCLOMINE HCL 20 MG PO TABS: 20.0000 mg | ORAL_TABLET | Freq: Four times a day (QID) | ORAL | Status: AC | PRN

## 2024-07-10 MED ORDER — LOPERAMIDE HCL 2 MG PO CAPS
2.0000 mg | ORAL_CAPSULE | ORAL | Status: AC | PRN
  Administered 2024-07-11: 4 mg via ORAL
  Filled 2024-07-10: qty 2

## 2024-07-10 MED ORDER — NICOTINE POLACRILEX 2 MG MT GUM
2.0000 mg | CHEWING_GUM | OROMUCOSAL | Status: DC | PRN
Start: 2024-07-10 — End: 2024-07-22
  Administered 2024-07-11 – 2024-07-19 (×6): 2 mg via ORAL
  Filled 2024-07-10 (×6): qty 1

## 2024-07-10 MED ORDER — QUETIAPINE FUMARATE 25 MG PO TABS
25.0000 mg | ORAL_TABLET | Freq: Three times a day (TID) | ORAL | Status: DC
  Administered 2024-07-10 – 2024-07-11 (×3): 25 mg via ORAL
  Filled 2024-07-10 (×3): qty 1

## 2024-07-10 MED ORDER — HALOPERIDOL 5 MG PO TABS
5.0000 mg | ORAL_TABLET | Freq: Three times a day (TID) | ORAL | Status: DC | PRN
  Administered 2024-07-13 – 2024-07-20 (×4): 5 mg via ORAL
  Filled 2024-07-10 (×4): qty 1

## 2024-07-10 MED ORDER — HALOPERIDOL LACTATE 5 MG/ML IJ SOLN: 5.0000 mg | Freq: Three times a day (TID) | INTRAMUSCULAR | Status: DC | PRN

## 2024-07-10 MED ORDER — METHOCARBAMOL 500 MG PO TABS: 500.0000 mg | ORAL_TABLET | Freq: Three times a day (TID) | ORAL | Status: AC | PRN

## 2024-07-10 MED ORDER — QUETIAPINE FUMARATE 25 MG PO TABS
25.0000 mg | ORAL_TABLET | Freq: Three times a day (TID) | ORAL | Status: DC
  Administered 2024-07-10: 25 mg via ORAL
  Filled 2024-07-10: qty 1

## 2024-07-10 MED ORDER — CLONIDINE HCL 0.1 MG PO TABS
0.1000 mg | ORAL_TABLET | Freq: Every day | ORAL | Status: AC
  Administered 2024-07-13 – 2024-07-14 (×2): 0.1 mg via ORAL
  Filled 2024-07-10 (×2): qty 1

## 2024-07-10 MED ORDER — MAGNESIUM HYDROXIDE 400 MG/5ML PO SUSP
30.0000 mL | Freq: Every day | ORAL | Status: DC | PRN
  Administered 2024-07-17 – 2024-07-19 (×3): 30 mL via ORAL
  Filled 2024-07-10 (×3): qty 30

## 2024-07-10 MED ORDER — CLONIDINE HCL 0.1 MG PO TABS
0.1000 mg | ORAL_TABLET | Freq: Four times a day (QID) | ORAL | Status: AC
  Administered 2024-07-10 (×2): 0.1 mg via ORAL
  Filled 2024-07-10 (×2): qty 1

## 2024-07-10 MED ORDER — DIPHENHYDRAMINE HCL 50 MG/ML IJ SOLN: 50.0000 mg | Freq: Three times a day (TID) | INTRAMUSCULAR | Status: DC | PRN

## 2024-07-10 MED ORDER — NICOTINE 21 MG/24HR TD PT24
21.0000 mg | MEDICATED_PATCH | Freq: Every day | TRANSDERMAL | Status: DC
  Administered 2024-07-11 – 2024-07-22 (×12): 21 mg via TRANSDERMAL
  Filled 2024-07-10 (×11): qty 1
  Filled 2024-07-10: qty 7
  Filled 2024-07-10: qty 1

## 2024-07-10 MED ORDER — DIPHENHYDRAMINE HCL 50 MG PO CAPS
50.0000 mg | ORAL_CAPSULE | Freq: Three times a day (TID) | ORAL | Status: DC | PRN
  Administered 2024-07-13 – 2024-07-20 (×4): 50 mg via ORAL
  Filled 2024-07-10 (×4): qty 1

## 2024-07-10 MED ORDER — NAPROXEN 500 MG PO TABS
500.0000 mg | ORAL_TABLET | Freq: Two times a day (BID) | ORAL | Status: AC | PRN
  Administered 2024-07-11: 500 mg via ORAL
  Filled 2024-07-10: qty 1

## 2024-07-10 MED ORDER — ALUM & MAG HYDROXIDE-SIMETH 200-200-20 MG/5ML PO SUSP
30.0000 mL | ORAL | Status: DC | PRN
  Administered 2024-07-12 – 2024-07-15 (×4): 30 mL via ORAL
  Filled 2024-07-10 (×4): qty 30

## 2024-07-10 NOTE — ED Provider Notes (Signed)
 FBC/OBS ASAP Discharge Summary  Date and Time: 07/10/2024 11:21 AM  Name: Morgan Donaldson  MRN:  [id number]   Discharge Diagnoses:  Final diagnoses:  Polysubstance abuse [facility])    Subjective:  Morgan Donaldson is  a 43 year old female who presented  to [facility] with a history of Opioid Use Disorder, severe and Stimulant Use Disorder, cocaine type, severe along with untreated Bipolar Disorder.  She presents  presents voluntarily for assessment and treatment. She is unaccompanied. . She states she is here to detox from crack cocaine and fentanyl .  Per chart review, she  presented in May 2025 for the same purpose, and she stated  she left before being admitted I just wasn't ready then. Reported  that  she is ready now.  Patient reported she has been using 1/2 gram of fentanyl  IV for the past year.  She has also been smoking approximately 1/2 gram of crack cocaine daily for the past year.  Patient stated she is done with using, stating she had this realization when she woke up under a bridge this morning.  Patient has been homeless for the past year.  She reported  she has a boyfriend, and things are going well outside of patient's substance use issues.  Patient stated her boyfriend is supportive of her seeking treatment and will allow her to stay with him once she is clean and has completed treatment.  Patient reported  she is also estranged from her sister and other family members due to her ongoing issues with substance use.  She is hopeful that after treatment she can reconnect with family.      Patient reported a psychiatric hx of Bipolar Disorder and she can remember that she was prescribed Seroquel in the far past. She was not taking any  medications prior to coming here. She presented  with withdrawal symptoms including  cramps, runny nose, increased fatigue, dry mouth, nausea.  Patient reported  she is looking for rehab services. Has a boyfriend who told her she can come home when she is sober.      Stay Summary: Patient  was admitted to Observation unit  and Clonidine detox protocol initiated. Plan was to  transfer to [facility] when bed available.  Per nursing, last night, patient complained of nausea and vomiting and was medicated. She otherwise has been getting rest and her appetite is good.   Patient is evaluated face-to-face by this provider. Chart reviewed today 07/10/2024. Morgan Donaldson is seen in bed, awake. She continues to appear disheveled, anxious, restless, unkept. She reports that last night was horrible. Reports she had episodes of abdominal discomfort involving nausea and vomiting. She reports no additional complications. She denies SI/HI/AVH. Her thought process is coherent and goal-directed: reports she still wants to go to rehab upon detox completion. She reports that her appetite is good, and sleep is improving.  Patient  reported that she wants to get back on Seroquel, that it was helping when she was taking it.  No additional concerns reported.   Patient continues to express motivation for treatment and rehab. She is admitted to [facility] by Dr Lawrnce to continue treatment-detox protocol. She is encouraged to discuss her after care plan with the social worker.   Total Time spent with patient: 45 minutes  Past Psychiatric History: Bipolar, PTSD, Polysubstance abuse Past Medical History: NA Family History: NA Family Psychiatric History: NA Social History: Currently homeless, unemployed Tobacco Cessation:  A prescription for an FDA-approved tobacco cessation medication provided at discharge  Current Medications:  Current Facility-Administered Medications  Medication Dose Route Frequency Provider Last Rate Last Admin   acetaminophen  (TYLENOL ) tablet 650 mg  650 mg Oral Q6H PRN Randall Starlyn HERO, NP   650 mg at 07/09/24 1018   alum & mag hydroxide-simeth (MAALOX/MYLANTA) 200-200-20 MG/5ML suspension 30 mL  30 mL Oral Q4H PRN Randall, Sirius Woodford M, NP       cloNIDine (CATAPRES)  tablet 0.1 mg  0.1 mg Oral QID Nkwenti, Doris, NP   0.1 mg at 07/10/24 1100   Followed by   NOREEN ON 07/11/2024] cloNIDine (CATAPRES) tablet 0.1 mg  0.1 mg Oral BH-qamhs Nkwenti, Doris, NP       Followed by   NOREEN ON 07/13/2024] cloNIDine (CATAPRES) tablet 0.1 mg  0.1 mg Oral QAC breakfast Tex Drilling, NP       dicyclomine (BENTYL) tablet 20 mg  20 mg Oral Q6H PRN Randall, Naftuli Dalsanto M, NP       haloperidol  (HALDOL ) tablet 5 mg  5 mg Oral TID PRN Randall Starlyn HERO, NP       And   diphenhydrAMINE  (BENADRYL ) capsule 50 mg  50 mg Oral TID PRN Randall Starlyn HERO, NP       haloperidol  lactate (HALDOL ) injection 5 mg  5 mg Intramuscular TID PRN Randall Starlyn HERO, NP       And   diphenhydrAMINE  (BENADRYL ) injection 50 mg  50 mg Intramuscular TID PRN Randall Starlyn HERO, NP       And   LORazepam  (ATIVAN ) injection 2 mg  2 mg Intramuscular TID PRN Randall Starlyn HERO, NP       haloperidol  lactate (HALDOL ) injection 10 mg  10 mg Intramuscular TID PRN Randall Starlyn HERO, NP       And   diphenhydrAMINE  (BENADRYL ) injection 50 mg  50 mg Intramuscular TID PRN Randall Starlyn HERO, NP       And   LORazepam  (ATIVAN ) injection 2 mg  2 mg Intramuscular TID PRN Lora Glomski M, NP       hydrOXYzine  (ATARAX ) tablet 25 mg  25 mg Oral Q6H PRN Randall, Lorelle Macaluso M, NP   25 mg at 07/09/24 2149   loperamide  (IMODIUM ) capsule 2-4 mg  2-4 mg Oral PRN Randall Starlyn HERO, NP       loperamide  (IMODIUM ) capsule 2-4 mg  2-4 mg Oral PRN Tex Drilling, NP       magnesium  hydroxide (MILK OF MAGNESIA) suspension 30 mL  30 mL Oral Daily PRN Randall, Bernetta Sutley M, NP       methocarbamol  (ROBAXIN ) tablet 500 mg  500 mg Oral Q8H PRN Randall, Tiwanda Threats M, NP       naproxen  (NAPROSYN ) tablet 500 mg  500 mg Oral BID PRN Randall, Carloyn Lahue M, NP       nicotine  (NICODERM CQ  - dosed in mg/24 hours) patch 21 mg  21 mg Transdermal Daily Nkwenti, Doris, NP   21 mg at 07/10/24 1100   nitrofurantoin  (macrocrystal-monohydrate) (MACROBID) capsule 100 mg  100 mg Oral Q12H Nkwenti, Doris, NP   100 mg at 07/10/24 1100   ondansetron  (ZOFRAN -ODT) disintegrating tablet 4 mg  4 mg Oral Q6H PRN Kastiel Simonian M, NP   4 mg at 07/09/24 1020   QUEtiapine (SEROQUEL) tablet 25 mg  25 mg Oral TID Guenther Dunshee M, NP   25 mg at 07/10/24 1109   traZODone  (DESYREL ) tablet 100 mg  100 mg Oral QHS PRN Tex Drilling, NP   100 mg at 07/09/24 2149  No current outpatient medications on file.    PTA Medications:  Facility Ordered Medications  Medication   acetaminophen  (TYLENOL ) tablet 650 mg   alum & mag hydroxide-simeth (MAALOX/MYLANTA) 200-200-20 MG/5ML suspension 30 mL   magnesium  hydroxide (MILK OF MAGNESIA) suspension 30 mL   haloperidol  (HALDOL ) tablet 5 mg   And   diphenhydrAMINE  (BENADRYL ) capsule 50 mg   haloperidol  lactate (HALDOL ) injection 5 mg   And   diphenhydrAMINE  (BENADRYL ) injection 50 mg   And   LORazepam  (ATIVAN ) injection 2 mg   haloperidol  lactate (HALDOL ) injection 10 mg   And   diphenhydrAMINE  (BENADRYL ) injection 50 mg   And   LORazepam  (ATIVAN ) injection 2 mg   dicyclomine (BENTYL) tablet 20 mg   hydrOXYzine  (ATARAX ) tablet 25 mg   loperamide  (IMODIUM ) capsule 2-4 mg   methocarbamol  (ROBAXIN ) tablet 500 mg   naproxen  (NAPROSYN ) tablet 500 mg   ondansetron  (ZOFRAN -ODT) disintegrating tablet 4 mg   nitrofurantoin (macrocrystal-monohydrate) (MACROBID) capsule 100 mg   [COMPLETED] hydrOXYzine  (ATARAX ) tablet 50 mg   loperamide  (IMODIUM ) capsule 2-4 mg   cloNIDine (CATAPRES) tablet 0.1 mg   Followed by   NOREEN ON 07/11/2024] cloNIDine (CATAPRES) tablet 0.1 mg   Followed by   NOREEN ON 07/13/2024] cloNIDine (CATAPRES) tablet 0.1 mg   nicotine  (NICODERM CQ  - dosed in mg/24 hours) patch 21 mg   traZODone  (DESYREL ) tablet 100 mg   [COMPLETED] ondansetron  (ZOFRAN ) tablet 4 mg   QUEtiapine (SEROQUEL) tablet 25 mg       07/08/2024    6:22 PM 11/01/2020    9:32  AM 09/24/2020    8:24 AM  Depression screen PHQ 2/9  Decreased Interest 2 0 0  Down, Depressed, Hopeless 2 0 0  PHQ - 2 Score 4 0 0  Altered sleeping 2 0 0  Tired, decreased energy 2 0 0  Change in appetite 0 0 2  Feeling bad or failure about yourself  2 0 0  Trouble concentrating 1 0 0  Moving slowly or fidgety/restless 1 0 0  Suicidal thoughts 0 0 0  PHQ-9 Score 12 0  2   Difficult doing work/chores Very difficult       Data saved with a previous flowsheet row definition    Flowsheet Row ED from 07/08/2024 in [facility] ED from 05/17/2024 in [facility] Emergency Department at [facility] ED from 03/25/2024 in [facility] Emergency Department at [facility]  C-SSRS RISK CATEGORY No Risk No Risk No Risk    Musculoskeletal  Strength & Muscle Tone: within normal limits Gait & Station: normal Patient leans: N/A  Psychiatric Specialty Exam  Presentation  General Appearance:  Well Groomed  Eye Contact: Fair  Speech: Clear and Coherent  Speech Volume: Normal  Handedness: Right   Mood and Affect  Mood: Depressed; Anxious  Affect: Congruent   Thought Process  Thought Processes: Coherent  Descriptions of Associations:Intact  Orientation:Full (Time, Place and Person)  Thought Content:Logical; WDL  Diagnosis of Schizophrenia or Schizoaffective disorder in past: No    Hallucinations:No data recorded Ideas of Reference:None  Suicidal Thoughts:Suicidal Thoughts: No  Homicidal Thoughts:Homicidal Thoughts: No   Sensorium  Memory: Immediate Fair  Judgment: Fair  Insight: Fair   Art Therapist  Concentration: Fair  Attention Span: Fair  Recall: [employer] of Knowledge: Fair  Language: Fair   Psychomotor Activity  Psychomotor Activity: Psychomotor Activity: Normal   Assets  Assets: Communication Skills; Desire for Improvement  Sleep  Sleep: Sleep: Poor  No Safety Checks  orders active in given range  Nutritional Assessment (For OBS and FBC admissions only) Has the patient had a weight loss or gain of 10 pounds or more in the last 3 months?: No Has the patient had a decrease in food intake/or appetite?: No Does the patient have dental problems?: No Does the patient have eating habits or behaviors that may be indicators of an eating disorder including binging or inducing vomiting?: No Has the patient recently lost weight without trying?: 0 Has the patient been eating poorly because of a decreased appetite?: 0 Malnutrition Screening Tool Score: 0    Physical Exam  Physical Exam Vitals and nursing note reviewed.  HENT:     Head: Normocephalic and atraumatic.     Right Ear: Tympanic membrane normal.     Left Ear: Tympanic membrane normal.     Nose: Nose normal.     Mouth/Throat:     Mouth: Mucous membranes are moist.  Eyes:     Extraocular Movements: Extraocular movements intact.     Pupils: Pupils are equal, round, and reactive to light.  Cardiovascular:     Rate and Rhythm: Normal rate.     Pulses: Normal pulses.  Pulmonary:     Effort: Pulmonary effort is normal.  Musculoskeletal:        General: Normal range of motion.     Cervical back: Normal range of motion and neck supple.  Neurological:     General: No focal deficit present.     Mental Status: She is alert and oriented to person, place, and time.  Psychiatric:        Thought Content: Thought content normal.    Review of Systems  Constitutional: Negative.   HENT: Negative.    Eyes: Negative.   Respiratory: Negative.    Cardiovascular: Negative.   Gastrointestinal: Negative.   Genitourinary: Negative.   Musculoskeletal: Negative.   Skin: Negative.   Neurological: Negative.   Endo/Heme/Allergies: Negative.   Psychiatric/Behavioral:  Positive for depression and substance abuse. The patient is nervous/anxious.    Blood pressure 123/76, pulse 96, temperature 98.4 F (36.9 C),  temperature source Oral, resp. rate 18, SpO2 99%. There is no height or weight on file to calculate BMI.  Demographic Factors:  Caucasian, Low socioeconomic status, and Unemployed  Loss Factors: Financial problems/change in socioeconomic status  Historical Factors: NA  Risk Reduction Factors:   NA  Continued Clinical Symptoms:  Bipolar Disorder:   Mixed State Alcohol/Substance Abuse/Dependencies Previous Psychiatric Diagnoses and Treatments  Cognitive Features That Contribute To Risk:  None    Suicide Risk:  Minimal: No identifiable suicidal ideation.  Patients presenting with no risk factors but with morbid ruminations; may be classified as minimal risk based on the severity of the depressive symptoms  Plan Of Care/Follow-up recommendations:  Activities: As tolerated  Disposition: Patient to be admitted to [facility] to continue treatment and stabilization. Accepted by Dr Lawrnce, MD.   Randall Bouquet, NP 07/10/2024, 11:21 AM

## 2024-07-10 NOTE — ED Notes (Signed)
 Patient is resting with eyes closed. Breathing even and unlabored with even rise and fall of chest. No s/s of current distress.

## 2024-07-10 NOTE — ED Notes (Signed)
 Patient resting quietly in bed with eyes closed, Respirations equal and unlabored, skin warm and dry, NAD. Routine safety checks conducted according to facility protocol. Will continue to monitor for safety.

## 2024-07-10 NOTE — ED Notes (Signed)
 Pt admitted to [facility] voluntarily w/ c/o polysubstance abuse. Pt here for detox from crack cocaine and fentanyl . Pt denies SI/HI/AVH, is calm and cooperative.

## 2024-07-10 NOTE — ED Notes (Signed)
 Patient is transferring to [facility]. Report given to Damien, RN.

## 2024-07-10 NOTE — ED Notes (Signed)
 Patient showered and ate lunch. Patient currently denies SI,HI, and A/V/H with no plan or intent. Patient is med compliant and remains cooperative in unit. Patient is aware of pending transfer to [facility].

## 2024-07-10 NOTE — Group Note (Signed)
 Group Topic: Wellness  Group Date: 07/10/2024 Start Time: 2000 End Time: 2040 Facilitators: Joshua Ellouise CROME  Department: [facility]  Number of Participants: 7  Group Focus: check in Treatment Modality:  Leisure Development Interventions utilized were support Purpose: reinforce self-care  Name: Morgan Donaldson Date of Birth: August 14, 1981  MR: [id number]    Level of Participation: Did not participate Quality of Participation:  Interactions with others:  Mood/Affect:  Triggers (if applicable):  Cognition:  Progress:  Response:  Plan:   Patients Problems:  Patient Active Problem List   Diagnosis Date Noted   Polysubstance abuse (HCC) 07/10/2024   Pyosalpingitis 02/12/2024   Hepatitis C antibody detected 02/11/2024   PID (acute pelvic inflammatory disease) 02/10/2024   Gonorrhea 12/10/2023   Group A streptococcal infection 12/09/2023   IV drug abuse (HCC) 12/09/2023   Acute cystitis 12/09/2023   GAD (generalized anxiety disorder) 06/21/2020   Attention deficit hyperactivity disorder (ADHD), 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

## 2024-07-10 NOTE — ED Notes (Signed)
 Patient c/o nausea and vomiting. Nps aware. Vomitus appears to be just food. Patient requesting to get nausea medication

## 2024-07-10 NOTE — ED Notes (Signed)
 Pt is sleeping at this time. No acute distress noted.

## 2024-07-11 DIAGNOSIS — F1924 Other psychoactive substance dependence with psychoactive substance-induced mood disorder: Secondary | ICD-10-CM | POA: Diagnosis not present

## 2024-07-11 DIAGNOSIS — F411 Generalized anxiety disorder: Secondary | ICD-10-CM | POA: Diagnosis not present

## 2024-07-11 DIAGNOSIS — F1420 Cocaine dependence, uncomplicated: Secondary | ICD-10-CM | POA: Diagnosis not present

## 2024-07-11 DIAGNOSIS — F11220 Opioid dependence with intoxication, uncomplicated: Secondary | ICD-10-CM | POA: Diagnosis not present

## 2024-07-11 LAB — T4, FREE: Free T4: 0.71 ng/dL (ref 0.61–1.12)

## 2024-07-11 MED ORDER — HYDROXYZINE HCL 25 MG PO TABS
50.0000 mg | ORAL_TABLET | Freq: Four times a day (QID) | ORAL | Status: AC | PRN
  Administered 2024-07-11: 50 mg via ORAL
  Filled 2024-07-11: qty 2

## 2024-07-11 MED ORDER — QUETIAPINE FUMARATE 50 MG PO TABS
50.0000 mg | ORAL_TABLET | Freq: Three times a day (TID) | ORAL | Status: DC
  Administered 2024-07-11 – 2024-07-12 (×5): 50 mg via ORAL
  Filled 2024-07-11 (×6): qty 1

## 2024-07-11 NOTE — Group Note (Signed)
 Group Topic: Wellness  Group Date: 07/11/2024 Start Time: 1200 End Time: 1230 Facilitators: Daved Tinnie HERO, RN  Department: [facility]  Number of Participants: 6  Group Focus: nursing group Treatment Modality:  Psychoeducation Interventions utilized were patient education Purpose: increase insight  Name: Morgan Donaldson Date of Birth: 08-02-1981  MR: [id number]    Level of Participation: active Quality of Participation: attentive Interactions with others: gave feedback Mood/Affect: appropriate Triggers (if applicable): n/a  Cognition: coherent/clear Progress: Gaining insight Response: pt expressed understanding Plan: patient will be encouraged to attend future RN education group   Patients Problems:  Patient Active Problem List   Diagnosis Date Noted   Polysubstance abuse (HCC) 07/10/2024   Pyosalpingitis 02/12/2024   Hepatitis C antibody detected 02/11/2024   PID (acute pelvic inflammatory disease) 02/10/2024   Gonorrhea 12/10/2023   Group A streptococcal infection 12/09/2023   IV drug abuse (HCC) 12/09/2023   Acute cystitis 12/09/2023   GAD (generalized anxiety disorder) 06/21/2020   Attention deficit hyperactivity disorder (ADHD), 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

## 2024-07-11 NOTE — ED Notes (Signed)
 Attended [facility].

## 2024-07-11 NOTE — ED Notes (Addendum)
 Pt reports feeling 'okay' and 'fine'. Pt denies si hi and avh- verbal contract for safety provided. Pt reports to have eaten breakfast. Pt c/o level 7/10 rib pain- prn naproxen  administered. Medications reviewed, questions denied.

## 2024-07-11 NOTE — ED Notes (Addendum)
 Pt ate dinner. Pt declines concerns or complaints at this time.

## 2024-07-11 NOTE — ED Notes (Signed)
 Blood drawn from pt, pt tolerated it well.

## 2024-07-11 NOTE — ED Provider Notes (Addendum)
 Facility Based Crisis Admission H&P  Date: 07/11/24 Patient Name: Morgan Donaldson MRN: [id number] Chief Complaint: Fentanyl  detox  Diagnoses:  Final diagnoses:  Polysubstance abuse (HCC)  Opioid dependence with uncomplicated intoxication (HCC)    HPI: Morgan Donaldson is  a 43 year old female who presented  to [facility] with a history of Opioid Use Disorder, severe and Stimulant Use Disorder, cocaine type, severe along with untreated Bipolar Disorder. Pt was transferred to Facility Based Crisis unit for detox from Fentanyl  and crack cocaine. UDS positive for cocaine, morphine  and THC. BAL upon admission is negative.   Patient reported she has been using 1/2 gram of fentanyl  IV for the past year.  She has also been smoking approximately 1/2 gram of crack cocaine daily for the past year.  Patient stated she is done with using, stating she had this realization when she [place] up [place] a bridge this morning.  Patient has been homeless for the past year.  She reported  she has a boyfriend, and things are going well outside of patient's substance use issues.  Patient stated her boyfriend is supportive of her seeking treatment and will allow her to stay with him once she is clean and has completed treatment.  Patient reported  she is also estranged from her sister and other family members due to her ongoing issues with substance use.  She is hopeful that after treatment she can reconnect with family.   Subjective: Upon assessment today patient is observed in the day room watching TV and talking with peers.  Patient reports decreased sleep last night and has an increased appetite.  Patient denies suicidal ideations, homicidal ideations and hallucinations. She endorses current withdrawal symptoms including anxiety, body aches, nausea and runny nose.  Patient reports history of bipolar disorder and is not currently taking any medications.  She reports previously taking Seroquel 75 mg 3 times daily for mood  stabilization.  Patient reports ongoing anxiety and initially requests Ativan  as needed for anxiety.  After educating patient on this medication and its risks for dependency, patient agreed to trial an increase of hydroxyzine .  Patient is currently voluntary and is hopeful for residential treatment after detox.  Patient inquires about Suboxone but is unsure at this time due to lack of follow-up and being unsure if she will be able to get into a residential treatment facility due to her insurance.  Will further discuss at patient's discretion.  Patient denies any physical complaints or side effects from medications this time.  Patient encouraged to engage in group programming and assessments with staff.  Patient is pleasant and cooperative, agreeable to treatment plan.  PHQ 2-9:  Flowsheet Row ED from 07/10/2024 in [facility] ED from 07/08/2024 in [facility] Procedure visit from 11/01/[facility] in [facility]  Thoughts that you would be better off dead, or of hurting yourself in some way Not at all Not at all Not at all  PHQ-9 Total Score 7 12 0    Flowsheet Row ED from 07/10/2024 in [facility] ED from 07/08/2024 in [facility] ED from 05/17/2024 in [facility] Emergency Department at [facility]  C-SSRS RISK CATEGORY No Risk No Risk No Risk    Screenings    Flowsheet Row Most Recent Value  COWS Total Score 13    Total Time spent with patient: 1 hour  Musculoskeletal  Strength & Muscle  Tone: within normal limits Gait & Station: normal Patient leans: N/A  Psychiatric Specialty Exam  Presentation General Appearance:  Well Groomed  Eye Contact: Fair  Speech: Clear and Coherent  Speech Volume: Normal  Handedness: Right   Mood and Affect  Mood: Depressed; Anxious  Affect: Congruent   Thought Process   Thought Processes: Coherent  Descriptions of Associations:Intact  Orientation:Full (Time, Place and Person)  Thought Content:Logical; WDL  Diagnosis of Schizophrenia or Schizoaffective disorder in past: No   Hallucinations:No data recorded Ideas of Reference:None  Suicidal Thoughts:No data recorded Homicidal Thoughts:No data recorded  Sensorium  Memory: Immediate Fair  Judgment: Fair  Insight: Fair   Art Therapist  Concentration: Fair  Attention Span: Fair  Recall: [employer] of Knowledge: Fair  Language: Fair   Psychomotor Activity  Psychomotor Activity:No data recorded  Assets  Assets: Communication Skills; Desire for Improvement   Sleep  Sleep:No data recorded  No data recorded  Physical Exam Vitals and nursing note reviewed.  Constitutional:      Appearance: Normal appearance.  HENT:     Head: Normocephalic.     Nose: Nose normal.  Eyes:     Extraocular Movements: Extraocular movements intact.  Cardiovascular:     Rate and Rhythm: Normal rate.  Pulmonary:     Effort: Pulmonary effort is normal.  Musculoskeletal:        General: Normal range of motion.     Cervical back: Normal range of motion.  Neurological:     General: No focal deficit present.     Mental Status: She is alert and oriented to person, place, and time.    Review of Systems  Constitutional: Negative.   HENT: Negative.    Eyes: Negative.   Respiratory: Negative.    Cardiovascular: Negative.   Gastrointestinal: Negative.   Genitourinary: Negative.   Musculoskeletal: Negative.   Neurological: Negative.   Endo/Heme/Allergies: Negative.   Psychiatric/Behavioral:  Positive for substance abuse. The patient is nervous/anxious.     Blood pressure 107/62, pulse 76, temperature (!) 97.5 F (36.4 C), resp. rate 18, SpO2 99%. There is no height or weight on file to calculate BMI.  Past Psychiatric History: Bipolar d/o, PTSD, and polysubstance abuse.   Is  the patient at risk to self? No  Has the patient been a risk to self in the past 6 months? No .    Has the patient been a risk to self within the distant past? No   Is the patient a risk to others? No   Has the patient been a risk to others in the past 6 months? No   Has the patient been a risk to others within the distant past? No   Past Medical History:  Patient Active Problem List   Diagnosis Date Noted   Polysubstance abuse (HCC) 07/10/2024   Pyosalpingitis 02/12/2024   Hepatitis C antibody detected 02/11/2024   PID (acute pelvic inflammatory disease) 02/10/2024   Gonorrhea 12/10/2023   Group A streptococcal infection 12/09/2023   IV drug abuse (HCC) 12/09/2023   Acute cystitis 12/09/2023   GAD (generalized anxiety disorder) 06/21/2020   Attention deficit hyperactivity disorder (ADHD), 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    Past Medical History:  Diagnosis Date   Bronchitis  Hydrosalpinx    bilateral   Palpitations    Physiological ovarian cysts     Family History:  Family History  Problem Relation Age of Onset   Lung cancer Mother        died at age 38   CAD Mother        MI at age 54   Hypertension Mother    Schizophrenia Father    Hypertension Sister    Hyperlipidemia Sister    COPD Sister    Healthy Brother    Healthy Sister     Social History: Currently homeless, unemployed and in a relationship. She reports plan to live with boyfriend after finishing residential treatment. She endorses fentanyl , cocaine and THC use.   Last Labs:  Admission on 07/10/2024  Component Date Value Ref Range Status   Free T4 07/11/2024 0.71  0.61 - 1.12 ng/dL Final   Comment: (NOTE) Biotin ingestion may interfere with free T4  tests. If the results are inconsistent with the TSH level, previous test results, or the clinical presentation, then consider biotin interference. If needed, order repeat testing after stopping biotin. Performed at [facility], [address], [place], [place] [address]   Admission on 07/08/2024, Discharged on 07/10/2024  Component Date Value Ref Range Status   WBC 07/08/2024 10.6 (H)  4.0 - 10.5 K/uL Final   RBC 07/08/2024 5.65 (H)  3.87 - 5.11 MIL/uL Final   Hemoglobin 07/08/2024 16.5 (H)  12.0 - 15.0 g/dL Final   HCT 88/88/7974 49.4 (H)  36.0 - 46.0 % Final   MCV 07/08/2024 87.4  80.0 - 100.0 fL Final   MCH 07/08/2024 29.2  26.0 - 34.0 pg Final   MCHC 07/08/2024 33.4  30.0 - 36.0 g/dL Final   RDW 88/88/7974 13.7  11.5 - 15.5 % Final   Platelets 07/08/2024 422 (H)  150 - 400 K/uL Final   nRBC 07/08/2024 0.0  0.0 - 0.2 % Final   Neutrophils Relative % 07/08/2024 75  % Final   Neutro Abs 07/08/2024 7.9 (H)  1.7 - 7.7 K/uL Final   Lymphocytes Relative 07/08/2024 17  % Final   Lymphs Abs 07/08/2024 1.8  0.7 - 4.0 K/uL Final   Monocytes Relative 07/08/2024 7  % Final   Monocytes Absolute 07/08/2024 0.7  0.1 - 1.0 K/uL Final   Eosinophils Relative 07/08/2024 0  % Final   Eosinophils Absolute 07/08/2024 0.0  0.0 - 0.5 K/uL Final   Basophils Relative 07/08/2024 1  % Final   Basophils Absolute 07/08/2024 0.1  0.0 - 0.1 K/uL Final   Immature Granulocytes 07/08/2024 0  % Final   Abs Immature Granulocytes 07/08/2024 0.04  0.00 - 0.07 K/uL Final   Performed at [facility], [address], [place], [place] [address]   Sodium 07/08/2024 140  135 - 145 mmol/L Final   Potassium 07/08/2024 4.1  3.5 - 5.1 mmol/L Final   Chloride 07/08/2024 100  98 - 111 mmol/L Final   CO2 07/08/2024 27  22 - 32 mmol/L Final   Glucose, Bld 07/08/2024 113 (H)  70 - 99 mg/dL Final   Glucose reference range applies only to samples taken after fasting for at least 8 hours.   BUN 07/08/2024 10  6 - 20  mg/dL Final   Creatinine, Ser 07/08/2024 0.74  0.44 - 1.00 mg/dL Final   Calcium 88/88/7974 9.9  8.9 - 10.3 mg/dL Final   Total Protein 88/88/7974 7.9  6.5 - 8.1 g/dL  Final   Albumin 07/08/2024 3.5  3.5 - 5.0 g/dL Final   AST 88/88/7974 109 (H)  15 - 41 U/L Final   ALT 07/08/2024 132 (H)  0 - 44 U/L Final   Alkaline Phosphatase 07/08/2024 74  38 - 126 U/L Final   Total Bilirubin 07/08/2024 0.3  0.0 - 1.2 mg/dL Final   GFR, Estimated 07/08/2024 >60  >60 mL/min Final   Comment: (NOTE) Calculated using the CKD-EPI Creatinine Equation (2021)    Anion gap 07/08/2024 13  5 - 15 Final   Performed at [facility], [address], [place], [place] [address]   Hgb A1c MFr Bld 07/08/2024 5.5  4.8 - 5.6 % Final   Comment: (NOTE) Diagnosis of Diabetes The following HbA1c ranges recommended by the American Diabetes Association (ADA) may be used as an aid in the diagnosis of diabetes mellitus.  Hemoglobin             Suggested A1C NGSP%              Diagnosis  <5.7                   Non Diabetic  5.7-6.4                Pre-Diabetic  >6.4                   Diabetic  <7.0                   Glycemic control for                       adults with diabetes.     Mean Plasma Glucose 07/08/2024 111.15  mg/dL Final   Performed at [facility], [address], [place], [place] [address]   Magnesium  07/08/2024 2.1  1.7 - 2.4 mg/dL Final   Performed at [facility], [address] [place], [place] [address]   Alcohol, Ethyl (B) 07/08/2024 <15  <15 mg/dL Final   Comment: (NOTE) For medical purposes only. Performed at [facility], [address] [place], [place] [address]    Cholesterol 07/08/2024 174  0 - 200 mg/dL Final   Triglycerides 88/88/7974 102  <150 mg/dL Final   HDL 88/88/7974 42  >40 mg/dL Final   Total CHOL/HDL Ratio 07/08/2024 4.1  RATIO Final   VLDL 07/08/2024 20  0 - 40 mg/dL Final   LDL Cholesterol 07/08/2024 112 (H)  0 - 99 mg/dL Final   Comment:         Total Cholesterol/HDL:CHD Risk Coronary Heart Disease Risk Table                     Men   Women  1/2 Average Risk   3.4   3.3  Average Risk       5.0   4.4  2 X Average Risk   9.6   7.1  3 X Average Risk  23.4   11.0        Use the calculated Patient Ratio above and the CHD Risk Table to determine the patient's CHD Risk.        ATP III CLASSIFICATION (LDL):  <100     mg/dL   Optimal  899-870  mg/dL   Near or Above  Optimal  130-159  mg/dL   Borderline  839-810  mg/dL   High  >809     mg/dL   Very High Performed at [facility], [address] [place], [place] [address]    Color, Urine 07/09/2024 YELLOW  YELLOW Final   APPearance 07/09/2024 TURBID (A)  CLEAR Final   Specific Gravity, Urine 07/09/2024 1.017  1.005 - 1.030 Final   pH 07/09/2024 7.0  5.0 - 8.0 Final   Glucose, UA 07/09/2024 NEGATIVE  NEGATIVE mg/dL Final   Hgb urine dipstick 07/09/2024 NEGATIVE  NEGATIVE Final   Bilirubin Urine 07/09/2024 NEGATIVE  NEGATIVE Final   Ketones, ur 07/09/2024 NEGATIVE  NEGATIVE mg/dL Final   Protein, ur 88/87/7974 NEGATIVE  NEGATIVE mg/dL Final   Nitrite 88/87/7974 NEGATIVE  NEGATIVE Final   Leukocytes,Ua 07/09/2024 MODERATE (A)  NEGATIVE Final   RBC / HPF 07/09/2024 0-5  0 - 5 RBC/hpf Final   WBC, UA 07/09/2024 21-50  0 - 5 WBC/hpf Final   Bacteria, UA 07/09/2024 MANY (A)  NONE SEEN Final   Squamous Epithelial / HPF 07/09/2024 0-5  0 - 5 /HPF Final   WBC Clumps 07/09/2024 PRESENT   Final   Performed at [facility], [address], [place], [place] [address]   Preg Test, Ur 07/09/2024 Negative  Negative Final   POC Amphetamine  UR 07/09/2024 None Detected  NONE DETECTED (Cut Off Level 1000 ng/mL) Final   POC Secobarbital (BAR) 07/09/2024 None Detected  NONE DETECTED (Cut Off Level 300 ng/mL) Final   POC Buprenorphine (BUP) 07/09/2024 None Detected  NONE DETECTED (Cut Off Level 10 ng/mL) Final   POC Oxazepam (BZO) 07/09/2024 None Detected  NONE  DETECTED (Cut Off Level 300 ng/mL) Final   POC Cocaine UR 07/09/2024 Positive (A)  NONE DETECTED (Cut Off Level 300 ng/mL) Final   POC Methamphetamine UR 07/09/2024 None Detected  NONE DETECTED (Cut Off Level 1000 ng/mL) Final   POC Morphine  07/09/2024 Positive (A)  NONE DETECTED (Cut Off Level 300 ng/mL) Final   POC Methadone UR 07/09/2024 None Detected  NONE DETECTED (Cut Off Level 300 ng/mL) Final   POC Oxycodone  UR 07/09/2024 None Detected  NONE DETECTED (Cut Off Level 100 ng/mL) Final   POC Marijuana UR 07/09/2024 Positive (A)  NONE DETECTED (Cut Off Level 50 ng/mL) Final   TSH 07/08/2024 0.112 (L)  0.350 - 4.500 uIU/mL Final   Comment: Performed by a 3rd Generation assay with a functional sensitivity of <=0.01 uIU/mL. Performed at [facility], [address], [place], [place] [address]    Preg Test, Ur 07/09/2024 NEGATIVE  NEGATIVE Final   Comment:        THE SENSITIVITY OF THIS METHODOLOGY IS >20 mIU/mL.   Admission on 05/17/2024, Discharged on 05/18/2024  Component Date Value Ref Range Status   WBC 05/17/2024 9.0  4.0 - 10.5 K/uL Final   RBC 05/17/2024 4.35  3.87 - 5.11 MIL/uL Final   Hemoglobin 05/17/2024 12.8  12.0 - 15.0 g/dL Final   HCT 90/79/7974 39.7  36.0 - 46.0 % Final   MCV 05/17/2024 91.3  80.0 - 100.0 fL Final   MCH 05/17/2024 29.4  26.0 - 34.0 pg Final   MCHC 05/17/2024 32.2  30.0 - 36.0 g/dL Final   RDW 90/79/7974 14.1  11.5 - 15.5 % Final   Platelets 05/17/2024 286  150 - 400 K/uL Final   nRBC 05/17/2024 0.0  0.0 - 0.2 % Final   Neutrophils Relative %  05/17/2024 65  % Final   Neutro Abs 05/17/2024 5.8  1.7 - 7.7 K/uL Final   Lymphocytes Relative 05/17/2024 23  % Final   Lymphs Abs 05/17/2024 2.1  0.7 - 4.0 K/uL Final   Monocytes Relative 05/17/2024 8  % Final   Monocytes Absolute 05/17/2024 0.8  0.1 - 1.0 K/uL Final   Eosinophils Relative 05/17/2024 4  % Final   Eosinophils Absolute 05/17/2024 0.3  0.0 - 0.5 K/uL Final   Basophils Relative 05/17/2024 0   % Final   Basophils Absolute 05/17/2024 0.0  0.0 - 0.1 K/uL Final   Immature Granulocytes 05/17/2024 0  % Final   Abs Immature Granulocytes 05/17/2024 0.04  0.00 - 0.07 K/uL Final   Performed at [facility], [address], [place], [place] [address]   Preg, Serum 05/17/2024 NEGATIVE  NEGATIVE Final   Comment:        THE SENSITIVITY OF THIS METHODOLOGY IS >10 mIU/mL. Performed at [facility], [address], [place], [place] [address]    Sodium 05/17/2024 141  135 - 145 mmol/L Final   Potassium 05/17/2024 3.5  3.5 - 5.1 mmol/L Final   Chloride 05/17/2024 104  98 - 111 mmol/L Final   CO2 05/17/2024 26  22 - 32 mmol/L Final   Glucose, Bld 05/17/2024 118 (H)  70 - 99 mg/dL Final   Glucose reference range applies only to samples taken after fasting for at least 8 hours.   BUN 05/17/2024 11  6 - 20 mg/dL Final   Creatinine, Ser 05/17/2024 0.78  0.44 - 1.00 mg/dL Final   Calcium 90/79/7974 8.8 (L)  8.9 - 10.3 mg/dL Final   Total Protein 90/79/7974 7.0  6.5 - 8.1 g/dL Final   Albumin 90/79/7974 3.0 (L)  3.5 - 5.0 g/dL Final   AST 90/79/7974 115 (H)  15 - 41 U/L Final   ALT 05/17/2024 144 (H)  0 - 44 U/L Final   Alkaline Phosphatase 05/17/2024 71  38 - 126 U/L Final   Total Bilirubin 05/17/2024 0.5  0.0 - 1.2 mg/dL Final   GFR, Estimated 05/17/2024 >60  >60 mL/min Final   Comment: (NOTE) Calculated using the CKD-EPI Creatinine Equation (2021)    Anion gap 05/17/2024 11  5 - 15 Final   Performed at [facility], [address], [place], [place] [address]   Sodium 05/17/2024 142  135 - 145 mmol/L Final   Potassium 05/17/2024 3.5  3.5 - 5.1 mmol/L Final   Chloride 05/17/2024 103  98 - 111 mmol/L Final   BUN 05/17/2024 12  6 - 20 mg/dL Final   Creatinine, Ser 05/17/2024 0.70  0.44 - 1.00 mg/dL Final   Glucose, Bld 90/79/7974 120 (H)  70 - 99 mg/dL Final   Glucose reference range applies only to samples taken after fasting for at least 8 hours.   Calcium, Ion 05/17/2024  1.13 (L)  1.15 - 1.40 mmol/L Final   TCO2 05/17/2024 28  22 - 32 mmol/L Final   Hemoglobin 05/17/2024 11.6 (L)  12.0 - 15.0 g/dL Final   HCT 90/79/7974 34.0 (L)  36.0 - 46.0 % Final  Admission on 03/25/2024, Discharged on 03/25/2024  Component Date Value Ref Range Status   Sodium 03/25/2024 140  135 - 145 mmol/L Final   Potassium 03/25/2024 3.2 (L)  3.5 - 5.1 mmol/L Final   Chloride 03/25/2024 105  98 - 111 mmol/L Final   BUN 03/25/2024 9  6 -  20 mg/dL Final   Creatinine, Ser 03/25/2024 0.70  0.44 - 1.00 mg/dL Final   Glucose, Bld 92/70/7974 132 (H)  70 - 99 mg/dL Final   Glucose reference range applies only to samples taken after fasting for at least 8 hours.   Calcium, Ion 03/25/2024 0.99 (L)  1.15 - 1.40 mmol/L Final   TCO2 03/25/2024 24  22 - 32 mmol/L Final   Hemoglobin 03/25/2024 12.9  12.0 - 15.0 g/dL Final   HCT 92/70/7974 38.0  36.0 - 46.0 % Final   Lactic Acid, Venous 03/25/2024 3.2 (HH)  0.5 - 1.9 mmol/L Final   Comment 03/25/2024 NOTIFIED PHYSICIAN   Final  Admission on 02/10/2024, Discharged on 02/12/2024  Component Date Value Ref Range Status   WBC 02/10/2024 16.8 (H)  4.0 - 10.5 K/uL Final   RBC 02/10/2024 4.46  3.87 - 5.11 MIL/uL Final   Hemoglobin 02/10/2024 12.7  12.0 - 15.0 g/dL Final   HCT 93/84/7974 39.5  36.0 - 46.0 % Final   MCV 02/10/2024 88.6  80.0 - 100.0 fL Final   MCH 02/10/2024 28.5  26.0 - 34.0 pg Final   MCHC 02/10/2024 32.2  30.0 - 36.0 g/dL Final   RDW 93/84/7974 14.8  11.5 - 15.5 % Final   Platelets 02/10/2024 331  150 - 400 K/uL Final   nRBC 02/10/2024 0.0  0.0 - 0.2 % Final   Neutrophils Relative % 02/10/2024 83  % Final   Neutro Abs 02/10/2024 14.0 (H)  1.7 - 7.7 K/uL Final   Lymphocytes Relative 02/10/2024 9  % Final   Lymphs Abs 02/10/2024 1.6  0.7 - 4.0 K/uL Final   Monocytes Relative 02/10/2024 6  % Final   Monocytes Absolute 02/10/2024 1.0  0.1 - 1.0 K/uL Final   Eosinophils Relative 02/10/2024 1  % Final   Eosinophils Absolute  02/10/2024 0.1  0.0 - 0.5 K/uL Final   Basophils Relative 02/10/2024 0  % Final   Basophils Absolute 02/10/2024 0.1  0.0 - 0.1 K/uL Final   Immature Granulocytes 02/10/2024 1  % Final   Abs Immature Granulocytes 02/10/2024 0.08 (H)  0.00 - 0.07 K/uL Final   Performed at [facility], [address], [place], [place] [address]   Sodium 02/10/2024 136  135 - 145 mmol/L Final   Potassium 02/10/2024 3.9  3.5 - 5.1 mmol/L Final   Chloride 02/10/2024 101  98 - 111 mmol/L Final   CO2 02/10/2024 25  22 - 32 mmol/L Final   Glucose, Bld 02/10/2024 94  70 - 99 mg/dL Final   Glucose reference range applies only to samples taken after fasting for at least 8 hours.   BUN 02/10/2024 7  6 - 20 mg/dL Final   Creatinine, Ser 02/10/2024 0.66  0.44 - 1.00 mg/dL Final   Calcium 93/84/7974 8.7 (L)  8.9 - 10.3 mg/dL Final   Total Protein 93/84/7974 6.8  6.5 - 8.1 g/dL Final   Albumin 93/84/7974 3.2 (L)  3.5 - 5.0 g/dL Final   AST 93/84/7974 215 (H)  15 - 41 U/L Final   ALT 02/10/2024 143 (H)  0 - 44 U/L Final   Alkaline Phosphatase 02/10/2024 82  38 - 126 U/L Final   Total Bilirubin 02/10/2024 1.0  0.0 - 1.2 mg/dL Final   GFR, Estimated 02/10/2024 >60  >60 mL/min Final   Comment: (NOTE) Calculated using the CKD-EPI Creatinine Equation (2021)    Anion gap 02/10/2024 10  5 - 15 Final  Performed at [facility], [address], [place], [place] [address]   Lipase 02/10/2024 25  11 - 51 U/L Final   Performed at [facility], [address], [place], [place] [address]   Specimen Source 02/10/2024 URINE, CLEAN CATCH   Final   Color, Urine 02/10/2024 YELLOW  YELLOW Final   APPearance 02/10/2024 CLOUDY (A)  CLEAR Final   Specific Gravity, Urine 02/10/2024 1.015  1.005 - 1.030 Final   pH 02/10/2024 7.0  5.0 - 8.0 Final   Glucose, UA 02/10/2024 NEGATIVE  NEGATIVE mg/dL Final   Hgb urine dipstick 02/10/2024 NEGATIVE  NEGATIVE Final   Bilirubin Urine 02/10/2024 NEGATIVE  NEGATIVE Final    Ketones, ur 02/10/2024 NEGATIVE  NEGATIVE mg/dL Final   Protein, ur 93/84/7974 30 (A)  NEGATIVE mg/dL Final   Nitrite 93/84/7974 POSITIVE (A)  NEGATIVE Final   Leukocytes,Ua 02/10/2024 MODERATE (A)  NEGATIVE Final   RBC / HPF 02/10/2024 0-5  0 - 5 RBC/hpf Final   WBC, UA 02/10/2024 21-50  0 - 5 WBC/hpf Final   Comment:        Reflex urine culture not performed if WBC <=10, OR if Squamous epithelial cells >5. If Squamous epithelial cells >5 suggest recollection.    Bacteria, UA 02/10/2024 FEW (A)  NONE SEEN Final   Squamous Epithelial / HPF 02/10/2024 0-5  0 - 5 /HPF Final   Mucus 02/10/2024 PRESENT   Final   Amorphous Crystal 02/10/2024 PRESENT   Final   Performed at [facility], [address] [place], [place] [address]   Preg Test, Ur 02/10/2024 NEGATIVE  NEGATIVE Final   Comment:        THE SENSITIVITY OF THIS METHODOLOGY IS >25 mIU/mL. Performed at [facility], [address] [place], [place] [address]    Specimen Description 02/10/2024 BLOOD RIGHT FOREARM   Final   Special Requests 02/10/2024 BOTTLES DRAWN AEROBIC AND ANAEROBIC Blood Culture adequate volume   Final   Culture 02/10/2024    Final                   Value:NO GROWTH 5 DAYS Performed at [facility], [address], [place], [place] [address]    Report Status 02/10/2024 02/15/2024 FINAL   Final   Specimen Description 02/10/2024 BLOOD LEFT ANTECUBITAL   Final   Special Requests 02/10/2024 BOTTLES DRAWN AEROBIC AND ANAEROBIC Blood Culture results may not be optimal due to an inadequate volume of blood received in culture bottles   Final   Culture 02/10/2024    Final                   Value:NO GROWTH 5 DAYS Performed at [facility], [address], [place], [place] [address]    Report Status 02/10/2024 02/15/2024 FINAL   Final   Neisseria Gonorrhea 02/10/2024 Positive (A)   Final   Chlamydia 02/10/2024 Negative   Final   Comment 02/10/2024 Normal Reference Ranger Chlamydia - Negative    Final   Comment 02/10/2024 Normal Reference Range Neisseria Gonorrhea - Negative   Final   Yeast Wet Prep HPF POC 02/10/2024 NONE SEEN  NONE SEEN Final   Trich, Wet Prep 02/10/2024 NONE SEEN  NONE SEEN Final   Clue Cells Wet Prep HPF POC 02/10/2024 NONE SEEN  NONE SEEN Final   WBC, Wet Prep HPF POC 02/10/2024 >=10 (A)  <10 Final   Sperm 02/10/2024 NONE SEEN  Final   Performed at [facility], [address] [place], [place] [address]   HIV Screen 4th Generation wRfx 02/10/2024 Non Reactive  Non Reactive Final   Performed at [facility], [address] [place], [place] [address]   RPR Ser Ql 02/10/2024 NON REACTIVE  NON REACTIVE Final   Performed at [facility], [address] [place], [place] [address]   Hepatitis B Surface Ag 02/10/2024 NON REACTIVE  NON REACTIVE Final   HCV Ab 02/10/2024 Reactive (A)  NON REACTIVE Final   Comment: (NOTE) The CDC recommends that a Reactive HCV antibody result be followed up  with a HCV Nucleic Acid Amplification test.     Hep A IgM 02/10/2024 NON REACTIVE  NON REACTIVE Final   Hep B C IgM 02/10/2024 NON REACTIVE  NON REACTIVE Final   Performed at [facility], [address] [place], [place] [address]   Specimen Description 02/10/2024 URINE, RANDOM   Final   Special Requests 02/10/2024    Final                   Value:NONE Reflexed from [id number] Performed at [facility], [address], [place], [place] [address]    Culture 02/10/2024 MULTIPLE SPECIES PRESENT, SUGGEST RECOLLECTION (A)   Final   Report Status 02/10/2024 02/11/2024 FINAL   Final   WBC 02/11/2024 17.9 (H)  4.0 - 10.5 K/uL Final   RBC 02/11/2024 4.32  3.87 - 5.11 MIL/uL Final   Hemoglobin 02/11/2024 12.4  12.0 - 15.0 g/dL Final   HCT 93/83/7974 38.1  36.0 - 46.0 % Final   MCV 02/11/2024 88.2  80.0 - 100.0 fL Final   MCH 02/11/2024 28.7  26.0 - 34.0 pg Final   MCHC 02/11/2024 32.5  30.0 - 36.0 g/dL Final   RDW 93/83/7974 14.9  11.5 - 15.5 % Final    Platelets 02/11/2024 328  150 - 400 K/uL Final   nRBC 02/11/2024 0.0  0.0 - 0.2 % Final   Performed at [facility], [address] [place], [place] [address]   Sodium 02/11/2024 134 (L)  135 - 145 mmol/L Final   Potassium 02/11/2024 4.1  3.5 - 5.1 mmol/L Final   Chloride 02/11/2024 103  98 - 111 mmol/L Final   CO2 02/11/2024 23  22 - 32 mmol/L Final   Glucose, Bld 02/11/2024 245 (H)  70 - 99 mg/dL Final   Glucose reference range applies only to samples taken after fasting for at least 8 hours.   BUN 02/11/2024 9  6 - 20 mg/dL Final   Creatinine, Ser 02/11/2024 0.76  0.44 - 1.00 mg/dL Final   Calcium 93/83/7974 7.9 (L)  8.9 - 10.3 mg/dL Final   Total Protein 93/83/7974 5.7 (L)  6.5 - 8.1 g/dL Final   Albumin 93/83/7974 2.3 (L)  3.5 - 5.0 g/dL Final   AST 93/83/7974 109 (H)  15 - 41 U/L Final   ALT 02/11/2024 95 (H)  0 - 44 U/L Final   Alkaline Phosphatase 02/11/2024 72  38 - 126 U/L Final   Total Bilirubin 02/11/2024 0.7  0.0 - 1.2 mg/dL Final   GFR, Estimated 02/11/2024 >60  >60 mL/min Final   Comment: (NOTE) Calculated using the CKD-EPI Creatinine Equation (2021)    Anion gap 02/11/2024 8  5 - 15 Final   Performed at [facility], [address], [place], [place] [address]  HCV RNA Qnt(log copy/mL) 02/11/2024 6.778  log10 IU/mL Corrected   HepC Qn 02/11/2024 6,000,000  IU/mL Final   Test Information 02/11/2024 Comment   Final   Comment: (NOTE) The quantitative range of this assay is 15 IU/mL to 100 million IU/mL.    Hcv Genotype 02/11/2024 Comment   Final   Comment: (NOTE) To be performed on this specimen. Performed At: [facility] [address] [place], [place] [id number] Jennette Shorter MD Ey:[id number]    aPTT 02/11/2024 37 (H)  24 - 36 seconds Final   Comment:        IF BASELINE aPTT IS ELEVATED, SUGGEST PATIENT RISK ASSESSMENT BE USED TO DETERMINE APPROPRIATE ANTICOAGULANT THERAPY. Performed at [facility], [address]  [place], [place] [address]    Prothrombin Time 02/11/2024 15.4 (H)  11.4 - 15.2 seconds Final   INR 02/11/2024 1.2  0.8 - 1.2 Final   Comment: (NOTE) INR goal varies based on device and disease states. Performed at [facility], [address], [place], [place] [address]    Fibrinogen  02/11/2024 524 (H)  210 - 475 mg/dL Final   Comment: (NOTE) Fibrinogen  results may be underestimated in patients receiving thrombolytic therapy. Performed at [facility], [address] [place], [place] [address]    TSH 02/11/2024 0.373  0.350 - 4.500 uIU/mL Final   Comment: Performed by a 3rd Generation assay with a functional sensitivity of <=0.01 uIU/mL. Performed at [facility], [address], [place], [place] [address]    WBC 02/12/2024 9.0  4.0 - 10.5 K/uL Final   RBC 02/12/2024 4.32  3.87 - 5.11 MIL/uL Final   Hemoglobin 02/12/2024 12.6  12.0 - 15.0 g/dL Final   HCT 93/82/7974 38.4  36.0 - 46.0 % Final   MCV 02/12/2024 88.9  80.0 - 100.0 fL Final   MCH 02/12/2024 29.2  26.0 - 34.0 pg Final   MCHC 02/12/2024 32.8  30.0 - 36.0 g/dL Final   RDW 93/82/7974 14.9  11.5 - 15.5 % Final   Platelets 02/12/2024 340  150 - 400 K/uL Final   nRBC 02/12/2024 0.0  0.0 - 0.2 % Final   Performed at [facility], [address] [place], [place] [address]   Sodium 02/12/2024 138  135 - 145 mmol/L Final   Potassium 02/12/2024 3.9  3.5 - 5.1 mmol/L Final   Chloride 02/12/2024 106  98 - 111 mmol/L Final   CO2 02/12/2024 22  22 - 32 mmol/L Final   Glucose, Bld 02/12/2024 147 (H)  70 - 99 mg/dL Final   Glucose reference range applies only to samples taken after fasting for at least 8 hours.   BUN 02/12/2024 10  6 - 20 mg/dL Final   Creatinine, Ser 02/12/2024 0.66  0.44 - 1.00 mg/dL Final   Calcium 93/82/7974 8.2 (L)  8.9 - 10.3 mg/dL Final   Total Protein 93/82/7974 5.9 (L)  6.5 - 8.1 g/dL Final   Albumin 93/82/7974 2.2 (L)  3.5 - 5.0 g/dL Final   AST 93/82/7974 133 (H)  15 - 41 U/L Final    ALT 02/12/2024 93 (H)  0 - 44 U/L Final   Alkaline Phosphatase 02/12/2024 63  38 - 126 U/L Final   Total Bilirubin 02/12/2024 0.4  0.0 - 1.2 mg/dL Final   GFR, Estimated 02/12/2024 >60  >60 mL/min Final   Comment: (NOTE) Calculated using the CKD-EPI Creatinine Equation (2021)    Anion gap 02/12/2024  10  5 - 15 Final   Performed at [facility], [address] [place], [place] [address]   Hgb A1c MFr Bld 02/12/2024 5.4  4.8 - 5.6 % Final   Comment: (NOTE) Diagnosis of Diabetes The following HbA1c ranges recommended by the American Diabetes Association (ADA) may be used as an aid in the diagnosis of diabetes mellitus.  Hemoglobin             Suggested A1C NGSP%              Diagnosis  <5.7                   Non Diabetic  5.7-6.4                Pre-Diabetic  >6.4                   Diabetic  <7.0                   Glycemic control for                       adults with diabetes.     Mean Plasma Glucose 02/12/2024 108.28  mg/dL Final   Performed at [facility], [address] [place], [place] [address]   Hepatitis C Genotype 02/11/2024 1a   Final   Comment: (NOTE) This test was developed and its performance characteristics determined by [facility]. It has not been cleared or approved by the Food and Drug Administration. Performed At: [facility] [address] [place], [place] [id number] Jennette Shorter MD Ey:[id number]     Allergies: Patient has no known allergies.  Medications:  Facility Ordered Medications  Medication   [COMPLETED] ondansetron  (ZOFRAN ) tablet 4 mg   acetaminophen  (TYLENOL ) tablet 650 mg   alum & mag hydroxide-simeth (MAALOX/MYLANTA) 200-200-20 MG/5ML suspension 30 mL   magnesium  hydroxide (MILK OF MAGNESIA) suspension 30 mL   traZODone  (DESYREL ) tablet 100 mg   [COMPLETED] cloNIDine (CATAPRES) tablet 0.1 mg   Followed by   cloNIDine (CATAPRES) tablet 0.1 mg   Followed by   NOREEN ON 07/13/2024] cloNIDine (CATAPRES) tablet  0.1 mg   dicyclomine (BENTYL) tablet 20 mg   loperamide  (IMODIUM ) capsule 2-4 mg   methocarbamol  (ROBAXIN ) tablet 500 mg   naproxen  (NAPROSYN ) tablet 500 mg   ondansetron  (ZOFRAN -ODT) disintegrating tablet 4 mg   nicotine  (NICODERM CQ  - dosed in mg/24 hours) patch 21 mg   haloperidol  (HALDOL ) tablet 5 mg   And   diphenhydrAMINE  (BENADRYL ) capsule 50 mg   haloperidol  lactate (HALDOL ) injection 5 mg   And   diphenhydrAMINE  (BENADRYL ) injection 50 mg   And   LORazepam  (ATIVAN ) injection 2 mg   haloperidol  lactate (HALDOL ) injection 10 mg   And   diphenhydrAMINE  (BENADRYL ) injection 50 mg   And   LORazepam  (ATIVAN ) injection 2 mg   feeding supplement (ENSURE PLUS HIGH PROTEIN) liquid 237 mL   nicotine  polacrilex (NICORETTE) gum 2 mg   QUEtiapine (SEROQUEL) tablet 50 mg   hydrOXYzine  (ATARAX ) tablet 50 mg    Long Term Goals: Improvement in symptoms so as ready for discharge  Short Term Goals: Patient will verbalize feelings in meetings with treatment team members., Patient will attend at least of 50% of the groups daily., Pt will complete the PHQ9 on admission, day 3 and discharge., Patient will participate in completing the [place] Suicide Severity Rating Scale, Patient will score  a low risk of violence for 24 hours prior to discharge, and Patient will take medications as prescribed daily.  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  Patient endorses using about a half a gram of fentanyl  IV for the past year and smokes half gram of crack cocaine daily for the past year.  She endorses current withdrawal symptoms including body aches, nausea and runny nose.  Patient reports history of bipolar disorder and is not currently taking any medications.  She reports previously taking Seroquel 75 mg 3 times daily for mood stabilization.  Patient is currently voluntary and is hopeful for residential treatment after  detox.  Patient inquires about Suboxone but is unsure at this time due to lack of follow-up and being unsure if she will be able to get into a residential treatment facility due to her insurance.  Will further discuss at patient's discretion.  Treatment plan:  Medications: - Continue NicoDerm patch 21 mg daily for smoking cessation - Continue nicotine  gum 2 mg as needed - Increase quetiapine from 25 mg 3 times daily to 50 mg 3 times daily for mood stabilization. - Increase hydroxyzine  from 25 mg to 50 mg 3 times daily as needed for anxiety - Continue trazodone  100 mg as needed nightly for sleep - Continue current agitation protocol per policy - Continue acetaminophen  650 mg every 6 hours as needed for pain  COWs Clonidine 0.1mg  taper continued, completion date of 07/14/2024: - Continue dicyclomine 20 mg as needed every 6 hours for spasms and cramping -Continue loperamide  2 to 4 mg as needed for diarrhea -Continue methocarbamol  500 mg every 8 hours as needed for muscle spasms -Continue naproxen  500 mg twice daily as needed for body aches -Continue ondansetron  4 mg every 6 hours as needed for nausea vomiting  Labs reviewed: - LFTs elevated with AST of 109 and ALT of 132.  Patient denies alcohol use.  Per chart review patient has history of hepatitis C with reactive results from testing in June 2025.  Will reorder LFTs to assess trend. -TSH level is low at 0.112.  Free T4 WNL at 0.71.  Patient denies history of hyperthyroidism.  - Patient requesting STD testing.  Orders placed for HIV, RPR and gonorrhea chlamydia.  Discharge planning:  -Length of stay 3 to 5 days - Patient is currently interested in transitioning to a residential substance abuse treatment facility upon discharge from [facility].  Social work will follow-up with referrals and resources. -Per social work:   Patient met with Social Research Officer, Government. They have a plan for when she discharges 11/20. She will be a part of [facility]  [facility]. they will house her until she goes inpatient at [facility].     Recommendations  Based on my evaluation the patient does not appear to have an emergency medical condition.  Alan JAYSON Mcardle, NP 07/11/24  3:38 PM

## 2024-07-11 NOTE — Care Management (Addendum)
 [facility] Care Management...   Addendum 10:33 am  Per Nanetta @ [facility]...  Patient has Medicaid [employer], she would need to switch her Chief Executive Officer met with patient to discuss planning  Patient requested inpatient treatment  Writer faxed referrals to [facility] and [facility]

## 2024-07-11 NOTE — Group Note (Signed)
 Group Topic: Relapse and Recovery  Group Date: 07/11/2024 Start Time: 1430 End Time: 1500 Facilitators: Laneta Renea POUR, NT  Department: [facility]  Number of Participants: 1  Group Focus: psychiatric education Treatment Modality:  Psychoeducation Interventions utilized were patient education and problem solving Purpose: enhance coping skills  Name: VERDELL KINCANNON Date of Birth: November 19, 1980  MR: [id number]    Did not attend group.   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

## 2024-07-11 NOTE — Group Note (Signed)
 Group Topic: Recovery Basics  Group Date: 07/11/2024 Start Time: 2000 End Time: 2100 Facilitators: Anice Benton LABOR, NT  Department: [facility]  Number of Participants: 4  Group Focus: abuse issues and substance abuse education(AA Group) Treatment Modality:  Patient-Centered Therapy Interventions utilized were group exercise, patient education, and support Purpose: enhance coping skills, express feelings, increase insight, regain self-worth, and relapse prevention strategies  Name: Morgan Donaldson Date of Birth: 15-May-1981  MR: [id number]    Level of Participation: Did Not Attend Group  Quality of Participation: N/A Interactions with others: N/A Mood/Affect: N/A Triggers (if applicable): N/A Cognition: N/A Progress: None Response: N/A Plan: patient will be encouraged to attend groups   Patients Problems:  Patient Active Problem List   Diagnosis Date Noted   Polysubstance abuse (HCC) 07/10/2024   Pyosalpingitis 02/12/2024   Hepatitis C antibody detected 02/11/2024   PID (acute pelvic inflammatory disease) 02/10/2024   Gonorrhea 12/10/2023   Group A streptococcal infection 12/09/2023   IV drug abuse (HCC) 12/09/2023   Acute cystitis 12/09/2023   GAD (generalized anxiety disorder) 06/21/2020   Attention deficit hyperactivity disorder (ADHD), 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

## 2024-07-11 NOTE — [facility] Group Notes (Signed)
 Spiritual Care and Counseling Group Note  07/11/2024 2:45pm  Facilitated by: Librada Donnice Lin   Type of Therapy and Topic:  Hope    Participation Level:  Active  Description of Group:  Group focused on topic of hope.  Patients participated in facilitated discussion around topic, connecting with one another around experiences and definitions for hope.  Group members engaged with group word cloud.  Members selected an image of what hope looks like for them today.  Group engaged in discussion around how their definitions of hope are present today in hospital.       Summary of Patient Progress:  Morgan Donaldson was present throughout group. Actively engage din group discussion    Therapeutic Modalities: Psycho-social ed, Adlerian, Narrative, MI   Librada Donnice Lin, Chaplain 07/11/2024 11:46 PM

## 2024-07-11 NOTE — ED Notes (Signed)
 Pt is sleeping, no acute distress noted. Respirations are even and labored. Q15 safety checks in place.

## 2024-07-12 DIAGNOSIS — F1924 Other psychoactive substance dependence with psychoactive substance-induced mood disorder: Secondary | ICD-10-CM | POA: Diagnosis not present

## 2024-07-12 DIAGNOSIS — F411 Generalized anxiety disorder: Secondary | ICD-10-CM | POA: Diagnosis not present

## 2024-07-12 DIAGNOSIS — F1420 Cocaine dependence, uncomplicated: Secondary | ICD-10-CM | POA: Diagnosis not present

## 2024-07-12 DIAGNOSIS — F11220 Opioid dependence with intoxication, uncomplicated: Secondary | ICD-10-CM | POA: Diagnosis not present

## 2024-07-12 LAB — HEPATIC FUNCTION PANEL
ALT: 95 U/L — ABNORMAL HIGH (ref 0–44)
AST: 67 U/L — ABNORMAL HIGH (ref 15–41)
Albumin: 3.3 g/dL — ABNORMAL LOW (ref 3.5–5.0)
Alkaline Phosphatase: 110 U/L (ref 38–126)
Bilirubin, Direct: <0.1 mg/dL (ref 0.0–0.2)
Total Bilirubin: 0.6 mg/dL (ref 0.0–1.2)
Total Protein: 7.1 g/dL (ref 6.5–8.1)

## 2024-07-12 LAB — T3, FREE: T3, Free: 2.2 pg/mL (ref 2.0–4.4)

## 2024-07-12 MED ORDER — NITROFURANTOIN MONOHYD MACRO 100 MG PO CAPS
100.0000 mg | ORAL_CAPSULE | Freq: Two times a day (BID) | ORAL | Status: AC
  Administered 2024-07-12 – 2024-07-18 (×14): 100 mg via ORAL
  Filled 2024-07-12 (×14): qty 1

## 2024-07-12 MED ORDER — BUSPIRONE HCL 10 MG PO TABS
10.0000 mg | ORAL_TABLET | Freq: Two times a day (BID) | ORAL | Status: DC
  Administered 2024-07-12 – 2024-07-14 (×5): 10 mg via ORAL
  Filled 2024-07-12 (×5): qty 1

## 2024-07-12 MED ORDER — MEDROXYPROGESTERONE ACETATE 150 MG/ML IM SUSP
150.0000 mg | Freq: Once | INTRAMUSCULAR | Status: AC
  Administered 2024-07-12: 150 mg via INTRAMUSCULAR
  Filled 2024-07-12 (×2): qty 1

## 2024-07-12 MED ORDER — ENSURE PLUS HIGH PROTEIN PO LIQD
237.0000 mL | Freq: Three times a day (TID) | ORAL | Status: DC
  Administered 2024-07-12 – 2024-07-20 (×24): 237 mL via ORAL

## 2024-07-12 NOTE — ED Provider Notes (Addendum)
 Behavioral Health Progress Note  Date and Time: 07/12/2024 10:51 AM Name: Morgan Donaldson MRN:  [id number]  Subjective:  Morgan Donaldson reported  I am having  going body aches.  Evaluation:  Morgan Donaldson was seen and evaluated face-to-face by this provider.  Reports she continues to experience ongoing bodyaches and decreased sleep.  Currently she is provide trazodone  and Seroquel nightly.  Recent adjustment to trazodone .  She reports her main concern is being tested for STIs in particular HIV.  Discussed initiating Macrobid for positive leukocytes and many bacteria.  Awaiting for chlamydia, gonorrhea and trichomonas results.   Morgan Donaldson denied suicidal or homicidal ideations.  Denies auditory visual hallucinations.  Patient requested to be started on Depo-Provera  due to reported history related to ovarian cysts.  Unable to recall last Depo injection.  Morgan Donaldson states she is eager to attend residential treatment facility to get her life back on track.  Does report increased anxiety and symptoms of worry related to health and possibly contracting HIV.  Reports poor sleep and a fair appetite.  Staff to continue to monitor symptoms.  Support, encouragement and reassurance was provided.  History of presenting illness:Morgan Donaldson is a 43 year old female who presented to [facility] with a history of Opioid Use Disorder, severe and Stimulant Use Disorder, cocaine type, severe along with untreated Bipolar Disorder. She presents presents voluntarily for assessment and treatment. She is unaccompanied. . She states she is here to detox from crack cocaine and fentanyl .   Diagnosis:  Final diagnoses:  Polysubstance abuse (HCC)  Opioid dependence with uncomplicated intoxication (HCC)    Total Time spent with patient: 15 minutes  Information copied from chart: Past Psychiatric History: Bipolar, PTSD, Polysubstance abuse Past Medical History: NA Family History: NA Family Psychiatric History: NA Social History: Currently  homeless, unemployed Tobacco Cessation:  A prescription for an FDA-approved tobacco cessation medication provided at discharge   Additional Social History:                         Sleep: Fair  Appetite:  Fair  Current Medications:  Current Facility-Administered Medications  Medication Dose Route Frequency Provider Last Rate Last Admin   acetaminophen  (TYLENOL ) tablet 650 mg  650 mg Oral Q6H PRN Randall Starlyn HERO, NP   650 mg at 07/11/24 1453   alum & mag hydroxide-simeth (MAALOX/MYLANTA) 200-200-20 MG/5ML suspension 30 mL  30 mL Oral Q4H PRN Randall Starlyn HERO, NP       busPIRone (BUSPAR) tablet 10 mg  10 mg Oral BID Gottfried, Rhoda J, MD   10 mg at 07/12/24 0958   cloNIDine (CATAPRES) tablet 0.1 mg  0.1 mg Oral BH-qamhs Randall Starlyn HERO, NP   0.1 mg at 07/12/24 9072   Followed by   NOREEN ON 07/13/2024] cloNIDine (CATAPRES) tablet 0.1 mg  0.1 mg Oral QAC breakfast Randall, Veronique M, NP       dicyclomine (BENTYL) tablet 20 mg  20 mg Oral Q6H PRN Randall, Veronique M, NP       haloperidol  (HALDOL ) tablet 5 mg  5 mg Oral TID PRN Randall Starlyn HERO, NP       And   diphenhydrAMINE  (BENADRYL ) capsule 50 mg  50 mg Oral TID PRN Randall Starlyn HERO, NP       haloperidol  lactate (HALDOL ) injection 5 mg  5 mg Intramuscular TID PRN Randall Starlyn HERO, NP       And   diphenhydrAMINE  (BENADRYL ) injection 50 mg  50 mg  Intramuscular TID PRN Randall Starlyn HERO, NP       And   LORazepam  (ATIVAN ) injection 2 mg  2 mg Intramuscular TID PRN Randall Starlyn HERO, NP       haloperidol  lactate (HALDOL ) injection 10 mg  10 mg Intramuscular TID PRN Randall Starlyn HERO, NP       And   diphenhydrAMINE  (BENADRYL ) injection 50 mg  50 mg Intramuscular TID PRN Randall Starlyn HERO, NP       And   LORazepam  (ATIVAN ) injection 2 mg  2 mg Intramuscular TID PRN Randall, Veronique M, NP       feeding supplement (ENSURE PLUS HIGH PROTEIN) liquid 237 mL  237 mL Oral BID BM  Gottfried, Rhoda J, MD   237 mL at 07/12/24 0929   hydrOXYzine  (ATARAX ) tablet 50 mg  50 mg Oral Q6H PRN Brent, Amanda C, NP   50 mg at 07/11/24 1454   loperamide  (IMODIUM ) capsule 2-4 mg  2-4 mg Oral PRN Randall Starlyn HERO, NP   4 mg at 07/11/24 1454   magnesium  hydroxide (MILK OF MAGNESIA) suspension 30 mL  30 mL Oral Daily PRN Randall Starlyn HERO, NP       medroxyPROGESTERone  (DEPO-PROVERA ) injection 150 mg  150 mg Intramuscular Once Gottfried, Rhoda J, MD       methocarbamol  (ROBAXIN ) tablet 500 mg  500 mg Oral Q8H PRN Randall Starlyn HERO, NP       naproxen  (NAPROSYN ) tablet 500 mg  500 mg Oral BID PRN Randall, Veronique M, NP   500 mg at 07/11/24 0945   nicotine  (NICODERM CQ  - dosed in mg/24 hours) patch 21 mg  21 mg Transdermal Daily Byungura, Veronique M, NP   21 mg at 07/12/24 9070   nicotine  polacrilex (NICORETTE) gum 2 mg  2 mg Oral PRN Gottfried, Rhoda J, MD   2 mg at 07/11/24 1455   ondansetron  (ZOFRAN -ODT) disintegrating tablet 4 mg  4 mg Oral Q6H PRN Byungura, Veronique M, NP       QUEtiapine (SEROQUEL) tablet 50 mg  50 mg Oral TID Brent, Amanda C, NP   50 mg at 07/12/24 9072   traZODone  (DESYREL ) tablet 100 mg  100 mg Oral QHS PRN Byungura, Veronique M, NP   100 mg at 07/11/24 2123   No current outpatient medications on file.    Labs  Lab Results:  Admission on 07/10/2024  Component Date Value Ref Range Status   T3, Free 07/11/2024 2.2  2.0 - 4.4 pg/mL Final   Comment: (NOTE) Performed At: [facility] [address] [place], [place] [id number] Jennette Shorter MD Ey:[id number]    Free T4 07/11/2024 0.71  0.61 - 1.12 ng/dL Final   Comment: (NOTE) Biotin ingestion may interfere with free T4 tests. If the results are inconsistent with the TSH level, previous test results, or the clinical presentation, then consider biotin interference. If needed, order repeat testing after stopping biotin. Performed at [facility], [address], [place],  [place] [address]   Admission on 07/08/2024, Discharged on 07/10/2024  Component Date Value Ref Range Status   WBC 07/08/2024 10.6 (H)  4.0 - 10.5 K/uL Final   RBC 07/08/2024 5.65 (H)  3.87 - 5.11 MIL/uL Final   Hemoglobin 07/08/2024 16.5 (H)  12.0 - 15.0 g/dL Final   HCT 88/88/7974 49.4 (H)  36.0 - 46.0 % Final   MCV 07/08/2024 87.4  80.0 - 100.0 fL Final   MCH 07/08/2024 29.2  26.0 - 34.0 pg  Final   MCHC 07/08/2024 33.4  30.0 - 36.0 g/dL Final   RDW 88/88/7974 13.7  11.5 - 15.5 % Final   Platelets 07/08/2024 422 (H)  150 - 400 K/uL Final   nRBC 07/08/2024 0.0  0.0 - 0.2 % Final   Neutrophils Relative % 07/08/2024 75  % Final   Neutro Abs 07/08/2024 7.9 (H)  1.7 - 7.7 K/uL Final   Lymphocytes Relative 07/08/2024 17  % Final   Lymphs Abs 07/08/2024 1.8  0.7 - 4.0 K/uL Final   Monocytes Relative 07/08/2024 7  % Final   Monocytes Absolute 07/08/2024 0.7  0.1 - 1.0 K/uL Final   Eosinophils Relative 07/08/2024 0  % Final   Eosinophils Absolute 07/08/2024 0.0  0.0 - 0.5 K/uL Final   Basophils Relative 07/08/2024 1  % Final   Basophils Absolute 07/08/2024 0.1  0.0 - 0.1 K/uL Final   Immature Granulocytes 07/08/2024 0  % Final   Abs Immature Granulocytes 07/08/2024 0.04  0.00 - 0.07 K/uL Final   Performed at [facility], [address], [place], [place] [address]   Sodium 07/08/2024 140  135 - 145 mmol/L Final   Potassium 07/08/2024 4.1  3.5 - 5.1 mmol/L Final   Chloride 07/08/2024 100  98 - 111 mmol/L Final   CO2 07/08/2024 27  22 - 32 mmol/L Final   Glucose, Bld 07/08/2024 113 (H)  70 - 99 mg/dL Final   Glucose reference range applies only to samples taken after fasting for at least 8 hours.   BUN 07/08/2024 10  6 - 20 mg/dL Final   Creatinine, Ser 07/08/2024 0.74  0.44 - 1.00 mg/dL Final   Calcium 88/88/7974 9.9  8.9 - 10.3 mg/dL Final   Total Protein 88/88/7974 7.9  6.5 - 8.1 g/dL Final   Albumin 88/88/7974 3.5  3.5 - 5.0 g/dL Final   AST 88/88/7974 109 (H)  15 - 41 U/L Final   ALT  07/08/2024 132 (H)  0 - 44 U/L Final   Alkaline Phosphatase 07/08/2024 74  38 - 126 U/L Final   Total Bilirubin 07/08/2024 0.3  0.0 - 1.2 mg/dL Final   GFR, Estimated 07/08/2024 >60  >60 mL/min Final   Comment: (NOTE) Calculated using the CKD-EPI Creatinine Equation (2021)    Anion gap 07/08/2024 13  5 - 15 Final   Performed at [facility], [address], [place], [place] [address]   Hgb A1c MFr Bld 07/08/2024 5.5  4.8 - 5.6 % Final   Comment: (NOTE) Diagnosis of Diabetes The following HbA1c ranges recommended by the American Diabetes Association (ADA) may be used as an aid in the diagnosis of diabetes mellitus.  Hemoglobin             Suggested A1C NGSP%              Diagnosis  <5.7                   Non Diabetic  5.7-6.4                Pre-Diabetic  >6.4                   Diabetic  <7.0                   Glycemic control for                       adults  with diabetes.     Mean Plasma Glucose 07/08/2024 111.15  mg/dL Final   Performed at [facility], [address] [place], [place] [address]   Magnesium  07/08/2024 2.1  1.7 - 2.4 mg/dL Final   Performed at [facility], [address] [place], [place] [address]   Alcohol, Ethyl (B) 07/08/2024 <15  <15 mg/dL Final   Comment: (NOTE) For medical purposes only. Performed at [facility], [address] [place], [place] [address]    Cholesterol 07/08/2024 174  0 - 200 mg/dL Final   Triglycerides 88/88/7974 102  <150 mg/dL Final   HDL 88/88/7974 42  >40 mg/dL Final   Total CHOL/HDL Ratio 07/08/2024 4.1  RATIO Final   VLDL 07/08/2024 20  0 - 40 mg/dL Final   LDL Cholesterol 07/08/2024 112 (H)  0 - 99 mg/dL Final   Comment:        Total Cholesterol/HDL:CHD Risk Coronary Heart Disease Risk Table                     Men   Women  1/2 Average Risk   3.4   3.3  Average Risk       5.0   4.4  2 X Average Risk   9.6   7.1  3 X Average Risk  23.4   11.0        Use the calculated Patient Ratio above  and the CHD Risk Table to determine the patient's CHD Risk.        ATP III CLASSIFICATION (LDL):  <100     mg/dL   Optimal  899-870  mg/dL   Near or Above                    Optimal  130-159  mg/dL   Borderline  839-810  mg/dL   High  >809     mg/dL   Very High Performed at [facility], [address], [place], [place] [address]    Color, Urine 07/09/2024 YELLOW  YELLOW Final   APPearance 07/09/2024 TURBID (A)  CLEAR Final   Specific Gravity, Urine 07/09/2024 1.017  1.005 - 1.030 Final   pH 07/09/2024 7.0  5.0 - 8.0 Final   Glucose, UA 07/09/2024 NEGATIVE  NEGATIVE mg/dL Final   Hgb urine dipstick 07/09/2024 NEGATIVE  NEGATIVE Final   Bilirubin Urine 07/09/2024 NEGATIVE  NEGATIVE Final   Ketones, ur 07/09/2024 NEGATIVE  NEGATIVE mg/dL Final   Protein, ur 88/87/7974 NEGATIVE  NEGATIVE mg/dL Final   Nitrite 88/87/7974 NEGATIVE  NEGATIVE Final   Leukocytes,Ua 07/09/2024 MODERATE (A)  NEGATIVE Final   RBC / HPF 07/09/2024 0-5  0 - 5 RBC/hpf Final   WBC, UA 07/09/2024 21-50  0 - 5 WBC/hpf Final   Bacteria, UA 07/09/2024 MANY (A)  NONE SEEN Final   Squamous Epithelial / HPF 07/09/2024 0-5  0 - 5 /HPF Final   WBC Clumps 07/09/2024 PRESENT   Final   Performed at [facility], [address] [place], [place] [address]   Preg Test, Ur 07/09/2024 Negative  Negative Final   POC Amphetamine  UR 07/09/2024 None Detected  NONE DETECTED (Cut Off Level 1000 ng/mL) Final   POC Secobarbital (BAR) 07/09/2024 None Detected  NONE DETECTED (Cut Off Level 300 ng/mL) Final   POC Buprenorphine (BUP) 07/09/2024 None Detected  NONE DETECTED (Cut Off Level 10 ng/mL) Final   POC Oxazepam (BZO) 07/09/2024 None Detected  NONE DETECTED (Cut Off Level 300 ng/mL) Final   POC Cocaine UR 07/09/2024 Positive (A)  NONE DETECTED (Cut Off Level 300 ng/mL) Final   POC Methamphetamine UR 07/09/2024 None Detected  NONE DETECTED (Cut Off Level 1000 ng/mL) Final   POC Morphine  07/09/2024 Positive (A)  NONE  DETECTED (Cut Off Level 300 ng/mL) Final   POC Methadone UR 07/09/2024 None Detected  NONE DETECTED (Cut Off Level 300 ng/mL) Final   POC Oxycodone  UR 07/09/2024 None Detected  NONE DETECTED (Cut Off Level 100 ng/mL) Final   POC Marijuana UR 07/09/2024 Positive (A)  NONE DETECTED (Cut Off Level 50 ng/mL) Final   TSH 07/08/2024 0.112 (L)  0.350 - 4.500 uIU/mL Final   Comment: Performed by a 3rd Generation assay with a functional sensitivity of <=0.01 uIU/mL. Performed at [facility], [address] [place], [place] [address]    Preg Test, Ur 07/09/2024 NEGATIVE  NEGATIVE Final   Comment:        THE SENSITIVITY OF THIS METHODOLOGY IS >20 mIU/mL.   Admission on 05/17/2024, Discharged on 05/18/2024  Component Date Value Ref Range Status   WBC 05/17/2024 9.0  4.0 - 10.5 K/uL Final   RBC 05/17/2024 4.35  3.87 - 5.11 MIL/uL Final   Hemoglobin 05/17/2024 12.8  12.0 - 15.0 g/dL Final   HCT 90/79/7974 39.7  36.0 - 46.0 % Final   MCV 05/17/2024 91.3  80.0 - 100.0 fL Final   MCH 05/17/2024 29.4  26.0 - 34.0 pg Final   MCHC 05/17/2024 32.2  30.0 - 36.0 g/dL Final   RDW 90/79/7974 14.1  11.5 - 15.5 % Final   Platelets 05/17/2024 286  150 - 400 K/uL Final   nRBC 05/17/2024 0.0  0.0 - 0.2 % Final   Neutrophils Relative % 05/17/2024 65  % Final   Neutro Abs 05/17/2024 5.8  1.7 - 7.7 K/uL Final   Lymphocytes Relative 05/17/2024 23  % Final   Lymphs Abs 05/17/2024 2.1  0.7 - 4.0 K/uL Final   Monocytes Relative 05/17/2024 8  % Final   Monocytes Absolute 05/17/2024 0.8  0.1 - 1.0 K/uL Final   Eosinophils Relative 05/17/2024 4  % Final   Eosinophils Absolute 05/17/2024 0.3  0.0 - 0.5 K/uL Final   Basophils Relative 05/17/2024 0  % Final   Basophils Absolute 05/17/2024 0.0  0.0 - 0.1 K/uL Final   Immature Granulocytes 05/17/2024 0  % Final   Abs Immature Granulocytes 05/17/2024 0.04  0.00 - 0.07 K/uL Final   Performed at [facility], [address], [place], [place] [address]   Preg,  Serum 05/17/2024 NEGATIVE  NEGATIVE Final   Comment:        THE SENSITIVITY OF THIS METHODOLOGY IS >10 mIU/mL. Performed at [facility], [address], [place], [place] [address]    Sodium 05/17/2024 141  135 - 145 mmol/L Final   Potassium 05/17/2024 3.5  3.5 - 5.1 mmol/L Final   Chloride 05/17/2024 104  98 - 111 mmol/L Final   CO2 05/17/2024 26  22 - 32 mmol/L Final   Glucose, Bld 05/17/2024 118 (H)  70 - 99 mg/dL Final   Glucose reference range applies only to samples taken after fasting for at least 8 hours.   BUN 05/17/2024 11  6 - 20 mg/dL Final   Creatinine, Ser 05/17/2024 0.78  0.44 - 1.00 mg/dL Final   Calcium 90/79/7974 8.8 (L)  8.9 - 10.3 mg/dL Final  Total Protein 05/17/2024 7.0  6.5 - 8.1 g/dL Final   Albumin 90/79/7974 3.0 (L)  3.5 - 5.0 g/dL Final   AST 90/79/7974 115 (H)  15 - 41 U/L Final   ALT 05/17/2024 144 (H)  0 - 44 U/L Final   Alkaline Phosphatase 05/17/2024 71  38 - 126 U/L Final   Total Bilirubin 05/17/2024 0.5  0.0 - 1.2 mg/dL Final   GFR, Estimated 05/17/2024 >60  >60 mL/min Final   Comment: (NOTE) Calculated using the CKD-EPI Creatinine Equation (2021)    Anion gap 05/17/2024 11  5 - 15 Final   Performed at [facility], [address], [place], [place] [address]   Sodium 05/17/2024 142  135 - 145 mmol/L Final   Potassium 05/17/2024 3.5  3.5 - 5.1 mmol/L Final   Chloride 05/17/2024 103  98 - 111 mmol/L Final   BUN 05/17/2024 12  6 - 20 mg/dL Final   Creatinine, Ser 05/17/2024 0.70  0.44 - 1.00 mg/dL Final   Glucose, Bld 90/79/7974 120 (H)  70 - 99 mg/dL Final   Glucose reference range applies only to samples taken after fasting for at least 8 hours.   Calcium, Ion 05/17/2024 1.13 (L)  1.15 - 1.40 mmol/L Final   TCO2 05/17/2024 28  22 - 32 mmol/L Final   Hemoglobin 05/17/2024 11.6 (L)  12.0 - 15.0 g/dL Final   HCT 90/79/7974 34.0 (L)  36.0 - 46.0 % Final  Admission on 03/25/2024, Discharged on 03/25/2024  Component Date Value Ref Range  Status   Sodium 03/25/2024 140  135 - 145 mmol/L Final   Potassium 03/25/2024 3.2 (L)  3.5 - 5.1 mmol/L Final   Chloride 03/25/2024 105  98 - 111 mmol/L Final   BUN 03/25/2024 9  6 - 20 mg/dL Final   Creatinine, Ser 03/25/2024 0.70  0.44 - 1.00 mg/dL Final   Glucose, Bld 92/70/7974 132 (H)  70 - 99 mg/dL Final   Glucose reference range applies only to samples taken after fasting for at least 8 hours.   Calcium, Ion 03/25/2024 0.99 (L)  1.15 - 1.40 mmol/L Final   TCO2 03/25/2024 24  22 - 32 mmol/L Final   Hemoglobin 03/25/2024 12.9  12.0 - 15.0 g/dL Final   HCT 92/70/7974 38.0  36.0 - 46.0 % Final   Lactic Acid, Venous 03/25/2024 3.2 (HH)  0.5 - 1.9 mmol/L Final   Comment 03/25/2024 NOTIFIED PHYSICIAN   Final  Admission on 02/10/2024, Discharged on 02/12/2024  Component Date Value Ref Range Status   WBC 02/10/2024 16.8 (H)  4.0 - 10.5 K/uL Final   RBC 02/10/2024 4.46  3.87 - 5.11 MIL/uL Final   Hemoglobin 02/10/2024 12.7  12.0 - 15.0 g/dL Final   HCT 93/84/7974 39.5  36.0 - 46.0 % Final   MCV 02/10/2024 88.6  80.0 - 100.0 fL Final   MCH 02/10/2024 28.5  26.0 - 34.0 pg Final   MCHC 02/10/2024 32.2  30.0 - 36.0 g/dL Final   RDW 93/84/7974 14.8  11.5 - 15.5 % Final   Platelets 02/10/2024 331  150 - 400 K/uL Final   nRBC 02/10/2024 0.0  0.0 - 0.2 % Final   Neutrophils Relative % 02/10/2024 83  % Final   Neutro Abs 02/10/2024 14.0 (H)  1.7 - 7.7 K/uL Final   Lymphocytes Relative 02/10/2024 9  % Final   Lymphs Abs 02/10/2024 1.6  0.7 - 4.0 K/uL Final   Monocytes Relative 02/10/2024 6  %  Final   Monocytes Absolute 02/10/2024 1.0  0.1 - 1.0 K/uL Final   Eosinophils Relative 02/10/2024 1  % Final   Eosinophils Absolute 02/10/2024 0.1  0.0 - 0.5 K/uL Final   Basophils Relative 02/10/2024 0  % Final   Basophils Absolute 02/10/2024 0.1  0.0 - 0.1 K/uL Final   Immature Granulocytes 02/10/2024 1  % Final   Abs Immature Granulocytes 02/10/2024 0.08 (H)  0.00 - 0.07 K/uL Final   Performed at  [facility], [address] [place], [place] [address]   Sodium 02/10/2024 136  135 - 145 mmol/L Final   Potassium 02/10/2024 3.9  3.5 - 5.1 mmol/L Final   Chloride 02/10/2024 101  98 - 111 mmol/L Final   CO2 02/10/2024 25  22 - 32 mmol/L Final   Glucose, Bld 02/10/2024 94  70 - 99 mg/dL Final   Glucose reference range applies only to samples taken after fasting for at least 8 hours.   BUN 02/10/2024 7  6 - 20 mg/dL Final   Creatinine, Ser 02/10/2024 0.66  0.44 - 1.00 mg/dL Final   Calcium 93/84/7974 8.7 (L)  8.9 - 10.3 mg/dL Final   Total Protein 93/84/7974 6.8  6.5 - 8.1 g/dL Final   Albumin 93/84/7974 3.2 (L)  3.5 - 5.0 g/dL Final   AST 93/84/7974 215 (H)  15 - 41 U/L Final   ALT 02/10/2024 143 (H)  0 - 44 U/L Final   Alkaline Phosphatase 02/10/2024 82  38 - 126 U/L Final   Total Bilirubin 02/10/2024 1.0  0.0 - 1.2 mg/dL Final   GFR, Estimated 02/10/2024 >60  >60 mL/min Final   Comment: (NOTE) Calculated using the CKD-EPI Creatinine Equation (2021)    Anion gap 02/10/2024 10  5 - 15 Final   Performed at [facility], [address], [place], [place] [address]   Lipase 02/10/2024 25  11 - 51 U/L Final   Performed at [facility], [address] [place], [place] [address]   Specimen Source 02/10/2024 URINE, CLEAN CATCH   Final   Color, Urine 02/10/2024 YELLOW  YELLOW Final   APPearance 02/10/2024 CLOUDY (A)  CLEAR Final   Specific Gravity, Urine 02/10/2024 1.015  1.005 - 1.030 Final   pH 02/10/2024 7.0  5.0 - 8.0 Final   Glucose, UA 02/10/2024 NEGATIVE  NEGATIVE mg/dL Final   Hgb urine dipstick 02/10/2024 NEGATIVE  NEGATIVE Final   Bilirubin Urine 02/10/2024 NEGATIVE  NEGATIVE Final   Ketones, ur 02/10/2024 NEGATIVE  NEGATIVE mg/dL Final   Protein, ur 93/84/7974 30 (A)  NEGATIVE mg/dL Final   Nitrite 93/84/7974 POSITIVE (A)  NEGATIVE Final   Leukocytes,Ua 02/10/2024 MODERATE (A)  NEGATIVE Final   RBC / HPF 02/10/2024 0-5  0 - 5 RBC/hpf Final   WBC, UA  02/10/2024 21-50  0 - 5 WBC/hpf Final   Comment:        Reflex urine culture not performed if WBC <=10, OR if Squamous epithelial cells >5. If Squamous epithelial cells >5 suggest recollection.    Bacteria, UA 02/10/2024 FEW (A)  NONE SEEN Final   Squamous Epithelial / HPF 02/10/2024 0-5  0 - 5 /HPF Final   Mucus 02/10/2024 PRESENT   Final   Amorphous Crystal 02/10/2024 PRESENT   Final   Performed at [facility], [address], [place], [place] [address]   Preg Test, Ur 02/10/2024 NEGATIVE  NEGATIVE Final   Comment:  THE SENSITIVITY OF THIS METHODOLOGY IS >25 mIU/mL. Performed at [facility], [address], [place], [place] [address]    Specimen Description 02/10/2024 BLOOD RIGHT FOREARM   Final   Special Requests 02/10/2024 BOTTLES DRAWN AEROBIC AND ANAEROBIC Blood Culture adequate volume   Final   Culture 02/10/2024    Final                   Value:NO GROWTH 5 DAYS Performed at [facility], [address] [place], [place] [address]    Report Status 02/10/2024 02/15/2024 FINAL   Final   Specimen Description 02/10/2024 BLOOD LEFT ANTECUBITAL   Final   Special Requests 02/10/2024 BOTTLES DRAWN AEROBIC AND ANAEROBIC Blood Culture results may not be optimal due to an inadequate volume of blood received in culture bottles   Final   Culture 02/10/2024    Final                   Value:NO GROWTH 5 DAYS Performed at [facility], [address] [place], [place] [address]    Report Status 02/10/2024 02/15/2024 FINAL   Final   Neisseria Gonorrhea 02/10/2024 Positive (A)   Final   Chlamydia 02/10/2024 Negative   Final   Comment 02/10/2024 Normal Reference Ranger Chlamydia - Negative   Final   Comment 02/10/2024 Normal Reference Range Neisseria Gonorrhea - Negative   Final   Yeast Wet Prep HPF POC 02/10/2024 NONE SEEN  NONE SEEN Final   Trich, Wet Prep 02/10/2024 NONE SEEN  NONE SEEN Final   Clue Cells Wet Prep HPF POC 02/10/2024 NONE SEEN  NONE SEEN  Final   WBC, Wet Prep HPF POC 02/10/2024 >=10 (A)  <10 Final   Sperm 02/10/2024 NONE SEEN   Final   Performed at [facility], [address] [place], [place] [address]   HIV Screen 4th Generation wRfx 02/10/2024 Non Reactive  Non Reactive Final   Performed at [facility], [address], [place], [place] [address]   RPR Ser Ql 02/10/2024 NON REACTIVE  NON REACTIVE Final   Performed at [facility], [address], [place], [place] [address]   Hepatitis B Surface Ag 02/10/2024 NON REACTIVE  NON REACTIVE Final   HCV Ab 02/10/2024 Reactive (A)  NON REACTIVE Final   Comment: (NOTE) The CDC recommends that a Reactive HCV antibody result be followed up  with a HCV Nucleic Acid Amplification test.     Hep A IgM 02/10/2024 NON REACTIVE  NON REACTIVE Final   Hep B C IgM 02/10/2024 NON REACTIVE  NON REACTIVE Final   Performed at [facility] Lab, [address] [place], [place] [address]   Specimen Description 02/10/2024 URINE, RANDOM   Final   Special Requests 02/10/2024    Final                   Value:NONE Reflexed from [id number] Performed at [facility], [address], [place], [place] [address]    Culture 02/10/2024 MULTIPLE SPECIES PRESENT, SUGGEST RECOLLECTION (A)   Final   Report Status 02/10/2024 02/11/2024 FINAL   Final   WBC 02/11/2024 17.9 (H)  4.0 - 10.5 K/uL Final   RBC 02/11/2024 4.32  3.87 - 5.11 MIL/uL Final   Hemoglobin 02/11/2024 12.4  12.0 - 15.0 g/dL Final   HCT 93/83/7974 38.1  36.0 - 46.0 % Final   MCV 02/11/2024 88.2  80.0 -  100.0 fL Final   MCH 02/11/2024 28.7  26.0 - 34.0 pg Final   MCHC 02/11/2024 32.5  30.0 - 36.0 g/dL Final   RDW 93/83/7974 14.9  11.5 - 15.5 % Final   Platelets 02/11/2024 328  150 - 400 K/uL Final   nRBC 02/11/2024 0.0  0.0 - 0.2 % Final   Performed at [facility], [address] [place], [place] [address]   Sodium 02/11/2024 134 (L)  135 - 145 mmol/L Final   Potassium 02/11/2024 4.1  3.5 - 5.1 mmol/L Final    Chloride 02/11/2024 103  98 - 111 mmol/L Final   CO2 02/11/2024 23  22 - 32 mmol/L Final   Glucose, Bld 02/11/2024 245 (H)  70 - 99 mg/dL Final   Glucose reference range applies only to samples taken after fasting for at least 8 hours.   BUN 02/11/2024 9  6 - 20 mg/dL Final   Creatinine, Ser 02/11/2024 0.76  0.44 - 1.00 mg/dL Final   Calcium 93/83/7974 7.9 (L)  8.9 - 10.3 mg/dL Final   Total Protein 93/83/7974 5.7 (L)  6.5 - 8.1 g/dL Final   Albumin 93/83/7974 2.3 (L)  3.5 - 5.0 g/dL Final   AST 93/83/7974 109 (H)  15 - 41 U/L Final   ALT 02/11/2024 95 (H)  0 - 44 U/L Final   Alkaline Phosphatase 02/11/2024 72  38 - 126 U/L Final   Total Bilirubin 02/11/2024 0.7  0.0 - 1.2 mg/dL Final   GFR, Estimated 02/11/2024 >60  >60 mL/min Final   Comment: (NOTE) Calculated using the CKD-EPI Creatinine Equation (2021)    Anion gap 02/11/2024 8  5 - 15 Final   Performed at [facility], [address], [place], [place] [address]   HCV RNA Qnt(log copy/mL) 02/11/2024 6.778  log10 IU/mL Corrected   HepC Qn 02/11/2024 6,000,000  IU/mL Final   Test Information 02/11/2024 Comment   Final   Comment: (NOTE) The quantitative range of this assay is 15 IU/mL to 100 million IU/mL.    Hcv Genotype 02/11/2024 Comment   Final   Comment: (NOTE) To be performed on this specimen. Performed At: [facility] [address] [place], [place] [id number] Jennette Shorter MD Ey:[id number]    aPTT 02/11/2024 37 (H)  24 - 36 seconds Final   Comment:        IF BASELINE aPTT IS ELEVATED, SUGGEST PATIENT RISK ASSESSMENT BE USED TO DETERMINE APPROPRIATE ANTICOAGULANT THERAPY. Performed at [facility], [address] [place], [place] [address]    Prothrombin Time 02/11/2024 15.4 (H)  11.4 - 15.2 seconds Final   INR 02/11/2024 1.2  0.8 - 1.2 Final   Comment: (NOTE) INR goal varies based on device and disease states. Performed at [facility], [address] [place], [place] [address]     Fibrinogen  02/11/2024 524 (H)  210 - 475 mg/dL Final   Comment: (NOTE) Fibrinogen  results may be underestimated in patients receiving thrombolytic therapy. Performed at [facility], [address] [place], [place] [address]    TSH 02/11/2024 0.373  0.350 - 4.500 uIU/mL Final   Comment: Performed by a 3rd Generation assay with a functional sensitivity of <=0.01 uIU/mL. Performed at [facility], [address], [place], [place] [address]    WBC 02/12/2024 9.0  4.0 - 10.5 K/uL Final   RBC 02/12/2024 4.32  3.87 - 5.11 MIL/uL Final   Hemoglobin 02/12/2024 12.6  12.0 - 15.0 g/dL Final   HCT 93/82/7974 38.4  36.0 - 46.0 % Final   MCV 02/12/2024 88.9  80.0 - 100.0 fL Final   MCH 02/12/2024 29.2  26.0 - 34.0 pg Final   MCHC 02/12/2024 32.8  30.0 - 36.0 g/dL Final   RDW 93/82/7974 14.9  11.5 - 15.5 % Final   Platelets 02/12/2024 340  150 - 400 K/uL Final   nRBC 02/12/2024 0.0  0.0 - 0.2 % Final   Performed at [facility], [address], [place], [place] [address]   Sodium 02/12/2024 138  135 - 145 mmol/L Final   Potassium 02/12/2024 3.9  3.5 - 5.1 mmol/L Final   Chloride 02/12/2024 106  98 - 111 mmol/L Final   CO2 02/12/2024 22  22 - 32 mmol/L Final   Glucose, Bld 02/12/2024 147 (H)  70 - 99 mg/dL Final   Glucose reference range applies only to samples taken after fasting for at least 8 hours.   BUN 02/12/2024 10  6 - 20 mg/dL Final   Creatinine, Ser 02/12/2024 0.66  0.44 - 1.00 mg/dL Final   Calcium 93/82/7974 8.2 (L)  8.9 - 10.3 mg/dL Final   Total Protein 93/82/7974 5.9 (L)  6.5 - 8.1 g/dL Final   Albumin 93/82/7974 2.2 (L)  3.5 - 5.0 g/dL Final   AST 93/82/7974 133 (H)  15 - 41 U/L Final   ALT 02/12/2024 93 (H)  0 - 44 U/L Final   Alkaline Phosphatase 02/12/2024 63  38 - 126 U/L Final   Total Bilirubin 02/12/2024 0.4  0.0 - 1.2 mg/dL Final   GFR, Estimated 02/12/2024 >60  >60 mL/min Final   Comment: (NOTE) Calculated using the CKD-EPI Creatinine Equation  (2021)    Anion gap 02/12/2024 10  5 - 15 Final   Performed at [facility], [address] [place], [place] [address]   Hgb A1c MFr Bld 02/12/2024 5.4  4.8 - 5.6 % Final   Comment: (NOTE) Diagnosis of Diabetes The following HbA1c ranges recommended by the American Diabetes Association (ADA) may be used as an aid in the diagnosis of diabetes mellitus.  Hemoglobin             Suggested A1C NGSP%              Diagnosis  <5.7                   Non Diabetic  5.7-6.4                Pre-Diabetic  >6.4                   Diabetic  <7.0                   Glycemic control for                       adults with diabetes.     Mean Plasma Glucose 02/12/2024 108.28  mg/dL Final   Performed at [facility], [address], [place], [place] [address]   Hepatitis C Genotype 02/11/2024 1a   Final   Comment: (NOTE) This test was developed and its performance characteristics determined by [facility]. It has not been cleared or approved by the Food and Drug Administration. Performed At: [facility] [address] [place], [place] [id number] Jennette Shorter MD Ey:[id number]     Blood Alcohol level:  Lab Results  Component Value Date   [facility] <15 07/08/2024    Metabolic Disorder Labs: Lab Results  Component Value Date   HGBA1C 5.5 07/08/2024   MPG 111.15 07/08/2024   MPG 108.28 02/12/2024   No results found for: PROLACTIN Lab Results  Component Value Date   CHOL 174 07/08/2024   TRIG 102 07/08/2024   HDL 42 07/08/2024   CHOLHDL 4.1 07/08/2024   VLDL 20 07/08/2024   LDLCALC 112 (H) 07/08/2024   LDLCALC 73 09/22/2020    Therapeutic Lab Levels: No results found for: LITHIUM No results found for: VALPROATE No results found for: CBMZ  Physical Findings   GAD-7    Flowsheet Row Procedure visit from 11/01/2020 in Center for [facility] at [facility] Office Visit from 09/22/2020 in [facility] [employer] [facility] Clinical Support from 06/21/2020 in [facility] for [employer] [facility] Clinical Support from 04/05/2020 in Center for [employer] [facility] Clinical Support from 01/19/2020 in Center for [facility]  Total GAD-7 Score 7 2 10 8  0   PHQ2-9    Flowsheet Row ED from 07/10/2024 in [facility] ED from 07/08/2024 in [facility] Procedure visit from 11/01/2020 in Center for [employer] [facility] at [facility] Office Visit from 09/22/2020 in Center for [employer] [facility] at [facility] Clinical Support from 06/21/2020 in Center for [employer] [facility] at [facility]  PHQ-2 Total Score 2 4 0 0 2  PHQ-9 Total Score 7 12 0 2 10   Flowsheet Row ED from 07/10/2024 in [facility] ED from 07/08/2024 in [facility] ED from 05/17/2024 in [facility] Emergency Department at [facility]  C-SSRS RISK CATEGORY No Risk No Risk No Risk     Musculoskeletal  Strength & Muscle Tone: within normal limits Gait & Station: normal Patient leans: N/A  Psychiatric Specialty Exam  Presentation  General Appearance:  Appropriate for Environment  Eye Contact: Good  Speech: Clear and Coherent  Speech Volume: Normal  Handedness: Right   Mood and Affect  Mood: Anxious  Affect: Appropriate   Thought Process  Thought Processes: Coherent  Descriptions of Associations:Intact  Orientation:Full (Time, Place and Person)  Thought Content:Logical  Diagnosis of Schizophrenia or Schizoaffective disorder in past: No    Hallucinations:Hallucinations: Other (comment)  Ideas of Reference:None  Suicidal Thoughts:Suicidal Thoughts: No  Homicidal Thoughts:Homicidal Thoughts: No   Sensorium  Memory: Recent Good;  Immediate Good  Judgment: Good  Insight: Good   Executive Functions  Concentration: Fair  Attention Span: Good  Recall: Fair  Fund of Knowledge: Good  Language: Good   Psychomotor Activity  Psychomotor Activity: Psychomotor Activity: Normal   Assets  Assets: Communication Skills; Desire for Improvement   Sleep  Sleep: Sleep: Poor  Estimated Sleeping Duration (Last 24 Hours): 11.50-12.00 hours (Due to Daylight Saving Time, the durations displayed may not accurately represent documentation during the time change interval)  No data recorded  Physical Exam  Physical Exam Vitals and nursing note reviewed.  Neurological:     Mental Status: She is oriented to person, place, and time.  Psychiatric:        Mood and Affect: Mood normal.        Thought Content: Thought content normal.  Review of Systems  Psychiatric/Behavioral:  Positive for depression and substance abuse. Negative for suicidal ideas. The patient is nervous/anxious and has insomnia.   All other systems reviewed and are negative.  Blood pressure 126/73, pulse 93, temperature 98.4 F (36.9 C), temperature source Oral, resp. rate 17, SpO2 99%. There is no height or weight on file to calculate BMI.  Treatment Plan Summary: Daily contact with patient to assess and evaluate symptoms and progress in treatment and Medication management  Continue with current treatment plan on 07/12/2024 as listed below except were noted  Bipolar disorder: Major depressive disorder: Substance-induced mood disorder:  -  Continue quetiapine 50 mg 3 times daily for mood stabilization. - Increase hydroxyzine  from 25 mg to 50 mg 3 times daily as needed for anxiety - Continue trazodone  100 mg as needed nightly for sleep - Continue current agitation protocol per policy - Continue acetaminophen  650 mg every 6 hours as needed for pain   COWs Clonidine 0.1mg  taper continued, completion date of 07/14/2024:   CSW to  continue working on discharge disposition Patient encouraged to participate in the therapeutic milieu  Staci LOISE Kerns, NP 07/12/2024 10:51 AM

## 2024-07-12 NOTE — ED Notes (Addendum)
 Pt is requesting for STDs blood test .

## 2024-07-12 NOTE — ED Notes (Signed)
 Pt is sleeping. Respirations are even and labored. Q15 safety checks checks are in place per facility policy.

## 2024-07-12 NOTE — Group Note (Signed)
 Group Topic: Recovery Basics  Group Date: 07/12/2024 Start Time: 2000 End Time: 2100 Facilitators: Anice Benton LABOR, NT  Department: [facility]  Number of Participants: 5  Group Focus: abuse issues and clarity of thought( AA Group) Treatment Modality:  Patient-Centered Therapy and Solution-Focused Therapy Interventions utilized were group exercise and support Purpose: increase insight, regain self-worth, and relapse prevention strategies  Name: Morgan Donaldson Date of Birth: 07-14-1981  MR: [id number]    Level of Participation: active Quality of Participation: attentive Interactions with others: gave feedback Mood/Affect: appropriate Triggers (if applicable): N/A Cognition: coherent/clear and insightful Progress: Moderate Response: good Plan: follow-up needed  Patients Problems:  Patient Active Problem List   Diagnosis Date Noted   Polysubstance abuse (HCC) 07/10/2024   Pyosalpingitis 02/12/2024   Hepatitis C antibody detected 02/11/2024   PID (acute pelvic inflammatory disease) 02/10/2024   Gonorrhea 12/10/2023   Group A streptococcal infection 12/09/2023   IV drug abuse (HCC) 12/09/2023   Acute cystitis 12/09/2023   GAD (generalized anxiety disorder) 06/21/2020   Attention deficit hyperactivity disorder (ADHD), 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

## 2024-07-12 NOTE — Progress Notes (Signed)
 Patient is currently resting at this time.  No acute distress noted.  Respirations present, even and unlabored.  No issues noted at this time.  Will continue Q 15 min safety checks for safety/behavior.

## 2024-07-12 NOTE — Group Note (Signed)
 Group Topic: Communication  Group Date: 07/12/2024 Start Time: 1200 End Time: 1230 Facilitators: Nadav Swindell, Zane HERO, RN  Department: [facility]  Number of Participants: 1  Group Focus: check in Treatment Modality:  Individual Therapy Interventions utilized were support Purpose: express feelings  Name: Morgan Donaldson Date of Birth: [birth year]  MR: [id number]    Level of Participation: moderate Quality of Participation: cooperative Interactions with others: gave feedback Mood/Affect: appropriate Triggers (if applicable): none identified at this time Cognition: coherent/clear and logical Progress: Gaining insight Response: Patient spoke with writer regarding feelings regarding stay, discussed any issues on the unit. No concerns voiced at this time. Plan: patient will be encouraged to attend groups/programming on the unit  Patients Problems:  Patient Active Problem List   Diagnosis Date Noted   Polysubstance abuse (HCC) 07/10/2024   Pyosalpingitis 02/12/2024   Hepatitis C antibody detected 02/11/2024   PID (acute pelvic inflammatory disease) 02/10/2024   Gonorrhea 12/10/2023   Group A streptococcal infection 12/09/2023   IV drug abuse (HCC) 12/09/2023   Acute cystitis 12/09/2023   GAD (generalized anxiety disorder) 06/21/2020   Attention deficit hyperactivity disorder (ADHD), 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

## 2024-07-12 NOTE — ED Notes (Signed)
 Pt is sleeping at the moment. No acute distress noted.

## 2024-07-12 NOTE — Group Note (Signed)
 Group Topic: Recovery Basics  Group Date: 07/12/2024 Start Time: 1315 End Time: 1400 Facilitators: Alyse Leilani LABOR, NT  Department: [facility]  Number of Participants: 6  Group Focus: daily focus Treatment Modality:  Psychoeducation Interventions utilized were problem solving Purpose: increase insight  Name: Morgan Donaldson Date of Birth: [birth year]  MR: [id number]    Level of Participation: active Quality of Participation: cooperative and engaged Interactions with others: listened  Mood/Affect: positive Triggers (if applicable): none Cognition: coherent/clear and goal directed Progress: Gaining insight Response: none Plan: patient will be encouraged to keep coming to groups.  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

## 2024-07-12 NOTE — ED Notes (Signed)
 Patient alert & oriented x4. Denies intent to harm self or others when asked. Denies VH, endorses AH of someone calling her name. Patient denies any physical complaints when asked. Scheduled medications administered with no complications. No acute distress noted. Support and encouragement provided. Patient observed in milieu. No inappropriate behaviors observed or reported. Routine safety checks conducted per facility protocol. Encouraged patient to notify staff if any thoughts of harm towards self or others arise. Patient verbalizes understanding and agreement.

## 2024-07-13 DIAGNOSIS — F1420 Cocaine dependence, uncomplicated: Secondary | ICD-10-CM | POA: Diagnosis not present

## 2024-07-13 DIAGNOSIS — F11220 Opioid dependence with intoxication, uncomplicated: Secondary | ICD-10-CM | POA: Diagnosis not present

## 2024-07-13 DIAGNOSIS — F1924 Other psychoactive substance dependence with psychoactive substance-induced mood disorder: Secondary | ICD-10-CM | POA: Diagnosis not present

## 2024-07-13 DIAGNOSIS — F411 Generalized anxiety disorder: Secondary | ICD-10-CM | POA: Diagnosis not present

## 2024-07-13 LAB — RPR: RPR Ser Ql: NONREACTIVE

## 2024-07-13 MED ORDER — QUETIAPINE FUMARATE 50 MG PO TABS
50.0000 mg | ORAL_TABLET | Freq: Two times a day (BID) | ORAL | Status: DC
  Administered 2024-07-13 – 2024-07-14 (×3): 50 mg via ORAL
  Filled 2024-07-13 (×2): qty 1

## 2024-07-13 MED ORDER — QUETIAPINE FUMARATE 100 MG PO TABS
100.0000 mg | ORAL_TABLET | Freq: Every day | ORAL | Status: DC
  Administered 2024-07-13: 100 mg via ORAL
  Filled 2024-07-13 (×2): qty 1

## 2024-07-13 NOTE — ED Notes (Signed)
 Patient alert & oriented x4. Denies intent to harm self or others when asked. Denies A/VH. Patient initially endorsed generalized pain rating 7/10, PRN Tylenol  administered and pain has since resolved. No acute distress noted. Scheduled medications administered with no complications. Support and encouragement provided. Patient observed in milieu. No inappropriate behaviors observed or reported. Routine safety checks conducted per facility protocol. Encouraged patient to notify staff if any thoughts of harm towards self or others arise. Patient verbalizes understanding and agreement.

## 2024-07-13 NOTE — Group Note (Signed)
 Group Topic: Recovery Basics  Group Date: 07/13/2024 Start Time: 1100 End Time: 1200 Facilitators: Daniah Zaldivar, Zane HERO, RN; Lawrnce Garvin PARAS, MD; Pamelia Alm LITTIE HENRI  Department: [facility]  Number of Participants: 6  Group Focus: chemical dependency education, chemical dependency issues, communication, diagnosis education, and dual diagnosis Treatment Modality:  Interpersonal Therapy and Psychoeducation Interventions utilized were clarification, patient education, and support Purpose: improve communication skills and increase insight  Name: Morgan Donaldson Date of Birth: [birth year]  MR: [id number]    Level of Participation: did not attend group Quality of Participation:  Interactions with others:  Mood/Affect:  Triggers (if applicable):  Cognition:  Progress:  Response:  Plan: patient will be encouraged to attend future groups/programming on the unit  Patients Problems:  Patient Active Problem List   Diagnosis Date Noted   Polysubstance abuse (HCC) 07/10/2024   Pyosalpingitis 02/12/2024   Hepatitis C antibody detected 02/11/2024   PID (acute pelvic inflammatory disease) 02/10/2024   Gonorrhea 12/10/2023   Group A streptococcal infection 12/09/2023   IV drug abuse (HCC) 12/09/2023   Acute cystitis 12/09/2023   GAD (generalized anxiety disorder) 06/21/2020   Attention deficit hyperactivity disorder (ADHD), 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

## 2024-07-13 NOTE — Group Note (Signed)
 Group Topic: Healthy Self Image and Positive Change  Group Date: 07/13/2024 Start Time: 2030 End Time: 2100 Facilitators: Anice Benton LABOR, NT  Department: [facility]  Number of Participants: 6  Group Focus: affirmation, clarity of thought, and communication Treatment Modality:  Cognitive Behavioral Therapy and Patient-Centered Therapy Interventions utilized were group exercise and support Purpose: express feelings, increase insight, and regain self-worth  Name: Morgan Donaldson Date of Birth: Aug 09, 1981  MR: [id number]    Level of Participation: active Quality of Participation: attentive Interactions with others: gave feedback Mood/Affect: appropriate Triggers (if applicable): N/A Cognition: coherent/clear Progress: Moderate Response: Good Plan: follow-up needed  Patients Problems:  Patient Active Problem List   Diagnosis Date Noted   Polysubstance abuse (HCC) 07/10/2024   Pyosalpingitis 02/12/2024   Hepatitis C antibody detected 02/11/2024   PID (acute pelvic inflammatory disease) 02/10/2024   Gonorrhea 12/10/2023   Group A streptococcal infection 12/09/2023   IV drug abuse (HCC) 12/09/2023   Acute cystitis 12/09/2023   GAD (generalized anxiety disorder) 06/21/2020   Attention deficit hyperactivity disorder (ADHD), 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

## 2024-07-13 NOTE — Progress Notes (Signed)
 Patient is currently resting with eyes closed.  No acute distress noted.  Respirations present, even and unlabored. No issues noted at this time.  Will continue Q 15 min safety checks for safety/behavior.

## 2024-07-13 NOTE — ED Provider Notes (Addendum)
 Behavioral Health Progress Note  Date and Time: 07/13/2024 8:20 AM Name: Morgan Donaldson MRN:  [id number]  Subjective:  Morgan Donaldson stated  they wouldn't give me my melatonin last night.  Evaluation: Morgan Donaldson was seen and evaluated face-to-face by this provider.  Patient observed interacting with peers in the day room.  She presents with a bright and pleasant affect.  Denied any concerns related to suicidal or homicidal ideations.  Reports she has been in contact with social work with plans to discharge to [facility] by way of [facility] in [place].  Stated she last attended residential treatment a month prior at [facility].  Reports she has not attended any of the afternoon group settings.   It is AA group and I don't use alcohol.  Stated she has attended NA groups.  Currently denying cravings or withdrawal symptoms.   Morgan Donaldson reports some concerns related to mood lability and increased depression.  PHQ-9 21. Discussed medication adjustment with quetiapine. will increase Seroquel 50 mg 3 times daily to 50 mg twice daily and increase Seroquel to 50 nightly to Seroquel 100 nightly.  Attending Psychiatrist prescribed Depo-Provera  patient received injection on 11/15.  STI panel to include HIV and syphilis pending results.  Patient was initiated on Macrobid, to which she reports she has been tolerating well.   Morgan Donaldson is standing in her room; she is alert/oriented x 3; calm/cooperative; and mood congruent with affect.  Patient is speaking in a clear tone at moderate volume, and normal pace; with good eye contact. Her thought process is coherent and relevant; There is no indication that she is currently responding to internal/external stimuli or experiencing delusional thought content.   Patient denies psychosis, and paranoia.  Patient has remained calm throughout assessment and has answered questions appropriately.   History of presenting illness:Morgan Donaldson is a 43 year old female who presented to  [facility] with a history of Opioid Use Disorder, severe and Stimulant Use Disorder, cocaine type, severe along with untreated Bipolar Disorder. She presents presents voluntarily for assessment and treatment. She is unaccompanied. She states she is here to detox from crack cocaine and fentanyl .   Diagnosis:  Final diagnoses:  Polysubstance abuse (HCC)  Opioid dependence with uncomplicated intoxication (HCC)  Drug-induced mood disorder (HCC)  Elevated liver function tests  Opioid use with withdrawal (HCC)  Hyperthyroidism  Generalized anxiety disorder    Total Time spent with patient: 15 minutes  Information copied from chart: Past Psychiatric History: Bipolar, PTSD, Polysubstance abuse Past Medical History: NA Family History: NA Family Psychiatric History: NA Social History: Currently homeless, unemployed Tobacco Cessation:  A prescription for an FDA-approved tobacco cessation medication provided at discharge   Additional Social History:                         Sleep: Fair  Appetite:  Fair  Current Medications:  Current Facility-Administered Medications  Medication Dose Route Frequency Provider Last Rate Last Admin   acetaminophen  (TYLENOL ) tablet 650 mg  650 mg Oral Q6H PRN Randall Starlyn HERO, NP   650 mg at 07/11/24 1453   alum & mag hydroxide-simeth (MAALOX/MYLANTA) 200-200-20 MG/5ML suspension 30 mL  30 mL Oral Q4H PRN Randall Starlyn HERO, NP   30 mL at 07/12/24 1415   busPIRone (BUSPAR) tablet 10 mg  10 mg Oral BID Gottfried, Rhoda J, MD   10 mg at 07/12/24 1548   cloNIDine (CATAPRES) tablet 0.1 mg  0.1 mg Oral QAC breakfast Randall,  Starlyn HERO, NP       dicyclomine (BENTYL) tablet 20 mg  20 mg Oral Q6H PRN Randall Starlyn HERO, NP       haloperidol  (HALDOL ) tablet 5 mg  5 mg Oral TID PRN Randall Starlyn HERO, NP       And   diphenhydrAMINE  (BENADRYL ) capsule 50 mg  50 mg Oral TID PRN Randall Starlyn HERO, NP       haloperidol  lactate (HALDOL )  injection 5 mg  5 mg Intramuscular TID PRN Randall Starlyn HERO, NP       And   diphenhydrAMINE  (BENADRYL ) injection 50 mg  50 mg Intramuscular TID PRN Randall Starlyn HERO, NP       And   LORazepam  (ATIVAN ) injection 2 mg  2 mg Intramuscular TID PRN Randall Starlyn HERO, NP       haloperidol  lactate (HALDOL ) injection 10 mg  10 mg Intramuscular TID PRN Randall Starlyn HERO, NP       And   diphenhydrAMINE  (BENADRYL ) injection 50 mg  50 mg Intramuscular TID PRN Randall Starlyn HERO, NP       And   LORazepam  (ATIVAN ) injection 2 mg  2 mg Intramuscular TID PRN Randall, Veronique M, NP       feeding supplement (ENSURE PLUS HIGH PROTEIN) liquid 237 mL  237 mL Oral TID BM Gottfried, Rhoda J, MD   237 mL at 07/12/24 2110   hydrOXYzine  (ATARAX ) tablet 50 mg  50 mg Oral Q6H PRN Brent, Amanda C, NP   50 mg at 07/11/24 1454   loperamide  (IMODIUM ) capsule 2-4 mg  2-4 mg Oral PRN Randall Starlyn HERO, NP   4 mg at 07/11/24 1454   magnesium  hydroxide (MILK OF MAGNESIA) suspension 30 mL  30 mL Oral Daily PRN Randall Starlyn HERO, NP       methocarbamol  (ROBAXIN ) tablet 500 mg  500 mg Oral Q8H PRN Randall, Veronique M, NP       naproxen  (NAPROSYN ) tablet 500 mg  500 mg Oral BID PRN Randall, Veronique M, NP   500 mg at 07/11/24 0945   nicotine  (NICODERM CQ  - dosed in mg/24 hours) patch 21 mg  21 mg Transdermal Daily Byungura, Veronique M, NP   21 mg at 07/12/24 9070   nicotine  polacrilex (NICORETTE) gum 2 mg  2 mg Oral PRN Gottfried, Rhoda J, MD   2 mg at 07/11/24 1455   nitrofurantoin (macrocrystal-monohydrate) (MACROBID) capsule 100 mg  100 mg Oral Q12H Makaio Mach N, NP   100 mg at 07/12/24 2110   ondansetron  (ZOFRAN -ODT) disintegrating tablet 4 mg  4 mg Oral Q6H PRN Byungura, Veronique M, NP       QUEtiapine (SEROQUEL) tablet 50 mg  50 mg Oral TID Brent, Amanda C, NP   50 mg at 07/12/24 2110   traZODone  (DESYREL ) tablet 100 mg  100 mg Oral QHS PRN Byungura, Veronique M, NP   100 mg at 07/12/24  2110   No current outpatient medications on file.    Labs  Lab Results:  Admission on 07/10/2024  Component Date Value Ref Range Status   T3, Free 07/11/2024 2.2  2.0 - 4.4 pg/mL Final   Comment: (NOTE) Performed At: [facility] [address] [place], [place] [id number] Jennette Shorter MD Ey:[id number]    Free T4 07/11/2024 0.71  0.61 - 1.12 ng/dL Final   Comment: (NOTE) Biotin ingestion may interfere with free T4 tests. If the results are inconsistent with the TSH level, previous test results, or  the clinical presentation, then consider biotin interference. If needed, order repeat testing after stopping biotin. Performed at [facility], [address], [place], [place] [address]    Total Protein 07/12/2024 7.1  6.5 - 8.1 g/dL Final   Albumin 88/84/7974 3.3 (L)  3.5 - 5.0 g/dL Final   AST 88/84/7974 67 (H)  15 - 41 U/L Final   ALT 07/12/2024 95 (H)  0 - 44 U/L Final   Alkaline Phosphatase 07/12/2024 110  38 - 126 U/L Final   Total Bilirubin 07/12/2024 0.6  0.0 - 1.2 mg/dL Final   Bilirubin, Direct 07/12/2024 <0.1  0.0 - 0.2 mg/dL Final   Indirect Bilirubin 07/12/2024 NOT CALCULATED  0.3 - 0.9 mg/dL Final   Performed at [facility], [address], [place], [place] [address]  Admission on 07/08/2024, Discharged on 07/10/2024  Component Date Value Ref Range Status   WBC 07/08/2024 10.6 (H)  4.0 - 10.5 K/uL Final   RBC 07/08/2024 5.65 (H)  3.87 - 5.11 MIL/uL Final   Hemoglobin 07/08/2024 16.5 (H)  12.0 - 15.0 g/dL Final   HCT 88/88/7974 49.4 (H)  36.0 - 46.0 % Final   MCV 07/08/2024 87.4  80.0 - 100.0 fL Final   MCH 07/08/2024 29.2  26.0 - 34.0 pg Final   MCHC 07/08/2024 33.4  30.0 - 36.0 g/dL Final   RDW 88/88/7974 13.7  11.5 - 15.5 % Final   Platelets 07/08/2024 422 (H)  150 - 400 K/uL Final   nRBC 07/08/2024 0.0  0.0 - 0.2 % Final   Neutrophils Relative % 07/08/2024 75  % Final   Neutro Abs 07/08/2024 7.9 (H)  1.7 - 7.7 K/uL Final   Lymphocytes  Relative 07/08/2024 17  % Final   Lymphs Abs 07/08/2024 1.8  0.7 - 4.0 K/uL Final   Monocytes Relative 07/08/2024 7  % Final   Monocytes Absolute 07/08/2024 0.7  0.1 - 1.0 K/uL Final   Eosinophils Relative 07/08/2024 0  % Final   Eosinophils Absolute 07/08/2024 0.0  0.0 - 0.5 K/uL Final   Basophils Relative 07/08/2024 1  % Final   Basophils Absolute 07/08/2024 0.1  0.0 - 0.1 K/uL Final   Immature Granulocytes 07/08/2024 0  % Final   Abs Immature Granulocytes 07/08/2024 0.04  0.00 - 0.07 K/uL Final   Performed at [facility], [address] [place], [place] [address]   Sodium 07/08/2024 140  135 - 145 mmol/L Final   Potassium 07/08/2024 4.1  3.5 - 5.1 mmol/L Final   Chloride 07/08/2024 100  98 - 111 mmol/L Final   CO2 07/08/2024 27  22 - 32 mmol/L Final   Glucose, Bld 07/08/2024 113 (H)  70 - 99 mg/dL Final   Glucose reference range applies only to samples taken after fasting for at least 8 hours.   BUN 07/08/2024 10  6 - 20 mg/dL Final   Creatinine, Ser 07/08/2024 0.74  0.44 - 1.00 mg/dL Final   Calcium 88/88/7974 9.9  8.9 - 10.3 mg/dL Final   Total Protein 88/88/7974 7.9  6.5 - 8.1 g/dL Final   Albumin 88/88/7974 3.5  3.5 - 5.0 g/dL Final   AST 88/88/7974 109 (H)  15 - 41 U/L Final   ALT 07/08/2024 132 (H)  0 - 44 U/L Final   Alkaline Phosphatase 07/08/2024 74  38 - 126 U/L Final   Total Bilirubin 07/08/2024 0.3  0.0 - 1.2 mg/dL Final   GFR, Estimated 07/08/2024 >60  >  60 mL/min Final   Comment: (NOTE) Calculated using the CKD-EPI Creatinine Equation (2021)    Anion gap 07/08/2024 13  5 - 15 Final   Performed at [facility], [address], [place], [place] [address]   Hgb A1c MFr Bld 07/08/2024 5.5  4.8 - 5.6 % Final   Comment: (NOTE) Diagnosis of Diabetes The following HbA1c ranges recommended by the American Diabetes Association (ADA) may be used as an aid in the diagnosis of diabetes mellitus.  Hemoglobin             Suggested A1C NGSP%               Diagnosis  <5.7                   Non Diabetic  5.7-6.4                Pre-Diabetic  >6.4                   Diabetic  <7.0                   Glycemic control for                       adults with diabetes.     Mean Plasma Glucose 07/08/2024 111.15  mg/dL Final   Performed at [facility], [address], [place], [place] [address]   Magnesium  07/08/2024 2.1  1.7 - 2.4 mg/dL Final   Performed at [facility], [address], [place], [place] [address]   Alcohol, Ethyl (B) 07/08/2024 <15  <15 mg/dL Final   Comment: (NOTE) For medical purposes only. Performed at [facility], [address] [place], [place] [address]    Cholesterol 07/08/2024 174  0 - 200 mg/dL Final   Triglycerides 88/88/7974 102  <150 mg/dL Final   HDL 88/88/7974 42  >40 mg/dL Final   Total CHOL/HDL Ratio 07/08/2024 4.1  RATIO Final   VLDL 07/08/2024 20  0 - 40 mg/dL Final   LDL Cholesterol 07/08/2024 112 (H)  0 - 99 mg/dL Final   Comment:        Total Cholesterol/HDL:CHD Risk Coronary Heart Disease Risk Table                     Men   Women  1/2 Average Risk   3.4   3.3  Average Risk       5.0   4.4  2 X Average Risk   9.6   7.1  3 X Average Risk  23.4   11.0        Use the calculated Patient Ratio above and the CHD Risk Table to determine the patient's CHD Risk.        ATP III CLASSIFICATION (LDL):  <100     mg/dL   Optimal  899-870  mg/dL   Near or Above                    Optimal  130-159  mg/dL   Borderline  839-810  mg/dL   High  >809     mg/dL   Very High Performed at [facility], [address] [place], [place] [address]    Color, Urine 07/09/2024 YELLOW  YELLOW Final   APPearance 07/09/2024 TURBID (A)  CLEAR Final   Specific Gravity, Urine 07/09/2024 1.017  1.005 - 1.030 Final   pH 07/09/2024 7.0  5.0 - 8.0 Final   Glucose, UA 07/09/2024 NEGATIVE  NEGATIVE mg/dL Final   Hgb urine dipstick 07/09/2024 NEGATIVE  NEGATIVE Final   Bilirubin Urine 07/09/2024  NEGATIVE  NEGATIVE Final   Ketones, ur 07/09/2024 NEGATIVE  NEGATIVE mg/dL Final   Protein, ur 88/87/7974 NEGATIVE  NEGATIVE mg/dL Final   Nitrite 88/87/7974 NEGATIVE  NEGATIVE Final   Leukocytes,Ua 07/09/2024 MODERATE (A)  NEGATIVE Final   RBC / HPF 07/09/2024 0-5  0 - 5 RBC/hpf Final   WBC, UA 07/09/2024 21-50  0 - 5 WBC/hpf Final   Bacteria, UA 07/09/2024 MANY (A)  NONE SEEN Final   Squamous Epithelial / HPF 07/09/2024 0-5  0 - 5 /HPF Final   WBC Clumps 07/09/2024 PRESENT   Final   Performed at [facility], [address], [place], [place] [address]   Preg Test, Ur 07/09/2024 Negative  Negative Final   POC Amphetamine  UR 07/09/2024 None Detected  NONE DETECTED (Cut Off Level 1000 ng/mL) Final   POC Secobarbital (BAR) 07/09/2024 None Detected  NONE DETECTED (Cut Off Level 300 ng/mL) Final   POC Buprenorphine (BUP) 07/09/2024 None Detected  NONE DETECTED (Cut Off Level 10 ng/mL) Final   POC Oxazepam (BZO) 07/09/2024 None Detected  NONE DETECTED (Cut Off Level 300 ng/mL) Final   POC Cocaine UR 07/09/2024 Positive (A)  NONE DETECTED (Cut Off Level 300 ng/mL) Final   POC Methamphetamine UR 07/09/2024 None Detected  NONE DETECTED (Cut Off Level 1000 ng/mL) Final   POC Morphine  07/09/2024 Positive (A)  NONE DETECTED (Cut Off Level 300 ng/mL) Final   POC Methadone UR 07/09/2024 None Detected  NONE DETECTED (Cut Off Level 300 ng/mL) Final   POC Oxycodone  UR 07/09/2024 None Detected  NONE DETECTED (Cut Off Level 100 ng/mL) Final   POC Marijuana UR 07/09/2024 Positive (A)  NONE DETECTED (Cut Off Level 50 ng/mL) Final   TSH 07/08/2024 0.112 (L)  0.350 - 4.500 uIU/mL Final   Comment: Performed by a 3rd Generation assay with a functional sensitivity of <=0.01 uIU/mL. Performed at [facility], [address] [place], [place] [address]    Preg Test, Ur 07/09/2024 NEGATIVE  NEGATIVE Final   Comment:        THE SENSITIVITY OF THIS METHODOLOGY IS >20 mIU/mL.   Admission on 05/17/2024,  Discharged on 05/18/2024  Component Date Value Ref Range Status   WBC 05/17/2024 9.0  4.0 - 10.5 K/uL Final   RBC 05/17/2024 4.35  3.87 - 5.11 MIL/uL Final   Hemoglobin 05/17/2024 12.8  12.0 - 15.0 g/dL Final   HCT 90/79/7974 39.7  36.0 - 46.0 % Final   MCV 05/17/2024 91.3  80.0 - 100.0 fL Final   MCH 05/17/2024 29.4  26.0 - 34.0 pg Final   MCHC 05/17/2024 32.2  30.0 - 36.0 g/dL Final   RDW 90/79/7974 14.1  11.5 - 15.5 % Final   Platelets 05/17/2024 286  150 - 400 K/uL Final   nRBC 05/17/2024 0.0  0.0 - 0.2 % Final   Neutrophils Relative % 05/17/2024 65  % Final   Neutro Abs 05/17/2024 5.8  1.7 - 7.7 K/uL Final   Lymphocytes Relative 05/17/2024 23  % Final   Lymphs Abs 05/17/2024 2.1  0.7 - 4.0 K/uL Final   Monocytes Relative 05/17/2024 8  % Final   Monocytes Absolute 05/17/2024 0.8  0.1 - 1.0 K/uL Final   Eosinophils Relative 05/17/2024 4  %  Final   Eosinophils Absolute 05/17/2024 0.3  0.0 - 0.5 K/uL Final   Basophils Relative 05/17/2024 0  % Final   Basophils Absolute 05/17/2024 0.0  0.0 - 0.1 K/uL Final   Immature Granulocytes 05/17/2024 0  % Final   Abs Immature Granulocytes 05/17/2024 0.04  0.00 - 0.07 K/uL Final   Performed at [facility], [address], [place], [place] [address]   Preg, Serum 05/17/2024 NEGATIVE  NEGATIVE Final   Comment:        THE SENSITIVITY OF THIS METHODOLOGY IS >10 mIU/mL. Performed at [facility], [address], [place], [place] [address]    Sodium 05/17/2024 141  135 - 145 mmol/L Final   Potassium 05/17/2024 3.5  3.5 - 5.1 mmol/L Final   Chloride 05/17/2024 104  98 - 111 mmol/L Final   CO2 05/17/2024 26  22 - 32 mmol/L Final   Glucose, Bld 05/17/2024 118 (H)  70 - 99 mg/dL Final   Glucose reference range applies only to samples taken after fasting for at least 8 hours.   BUN 05/17/2024 11  6 - 20 mg/dL Final   Creatinine, Ser 05/17/2024 0.78  0.44 - 1.00 mg/dL Final   Calcium 90/79/7974 8.8 (L)  8.9 - 10.3 mg/dL Final   Total  Protein 05/17/2024 7.0  6.5 - 8.1 g/dL Final   Albumin 90/79/7974 3.0 (L)  3.5 - 5.0 g/dL Final   AST 90/79/7974 115 (H)  15 - 41 U/L Final   ALT 05/17/2024 144 (H)  0 - 44 U/L Final   Alkaline Phosphatase 05/17/2024 71  38 - 126 U/L Final   Total Bilirubin 05/17/2024 0.5  0.0 - 1.2 mg/dL Final   GFR, Estimated 05/17/2024 >60  >60 mL/min Final   Comment: (NOTE) Calculated using the CKD-EPI Creatinine Equation (2021)    Anion gap 05/17/2024 11  5 - 15 Final   Performed at [facility], [address] [place], [place] [address]   Sodium 05/17/2024 142  135 - 145 mmol/L Final   Potassium 05/17/2024 3.5  3.5 - 5.1 mmol/L Final   Chloride 05/17/2024 103  98 - 111 mmol/L Final   BUN 05/17/2024 12  6 - 20 mg/dL Final   Creatinine, Ser 05/17/2024 0.70  0.44 - 1.00 mg/dL Final   Glucose, Bld 90/79/7974 120 (H)  70 - 99 mg/dL Final   Glucose reference range applies only to samples taken after fasting for at least 8 hours.   Calcium, Ion 05/17/2024 1.13 (L)  1.15 - 1.40 mmol/L Final   TCO2 05/17/2024 28  22 - 32 mmol/L Final   Hemoglobin 05/17/2024 11.6 (L)  12.0 - 15.0 g/dL Final   HCT 90/79/7974 34.0 (L)  36.0 - 46.0 % Final  Admission on 03/25/2024, Discharged on 03/25/2024  Component Date Value Ref Range Status   Sodium 03/25/2024 140  135 - 145 mmol/L Final   Potassium 03/25/2024 3.2 (L)  3.5 - 5.1 mmol/L Final   Chloride 03/25/2024 105  98 - 111 mmol/L Final   BUN 03/25/2024 9  6 - 20 mg/dL Final   Creatinine, Ser 03/25/2024 0.70  0.44 - 1.00 mg/dL Final   Glucose, Bld 92/70/7974 132 (H)  70 - 99 mg/dL Final   Glucose reference range applies only to samples taken after fasting for at least 8 hours.   Calcium, Ion 03/25/2024 0.99 (L)  1.15 - 1.40 mmol/L Final   TCO2 03/25/2024 24  22 - 32 mmol/L  Final   Hemoglobin 03/25/2024 12.9  12.0 - 15.0 g/dL Final   HCT 92/70/7974 38.0  36.0 - 46.0 % Final   Lactic Acid, Venous 03/25/2024 3.2 (HH)  0.5 - 1.9 mmol/L Final   Comment  03/25/2024 NOTIFIED PHYSICIAN   Final  Admission on 02/10/2024, Discharged on 02/12/2024  Component Date Value Ref Range Status   WBC 02/10/2024 16.8 (H)  4.0 - 10.5 K/uL Final   RBC 02/10/2024 4.46  3.87 - 5.11 MIL/uL Final   Hemoglobin 02/10/2024 12.7  12.0 - 15.0 g/dL Final   HCT 93/84/7974 39.5  36.0 - 46.0 % Final   MCV 02/10/2024 88.6  80.0 - 100.0 fL Final   MCH 02/10/2024 28.5  26.0 - 34.0 pg Final   MCHC 02/10/2024 32.2  30.0 - 36.0 g/dL Final   RDW 93/84/7974 14.8  11.5 - 15.5 % Final   Platelets 02/10/2024 331  150 - 400 K/uL Final   nRBC 02/10/2024 0.0  0.0 - 0.2 % Final   Neutrophils Relative % 02/10/2024 83  % Final   Neutro Abs 02/10/2024 14.0 (H)  1.7 - 7.7 K/uL Final   Lymphocytes Relative 02/10/2024 9  % Final   Lymphs Abs 02/10/2024 1.6  0.7 - 4.0 K/uL Final   Monocytes Relative 02/10/2024 6  % Final   Monocytes Absolute 02/10/2024 1.0  0.1 - 1.0 K/uL Final   Eosinophils Relative 02/10/2024 1  % Final   Eosinophils Absolute 02/10/2024 0.1  0.0 - 0.5 K/uL Final   Basophils Relative 02/10/2024 0  % Final   Basophils Absolute 02/10/2024 0.1  0.0 - 0.1 K/uL Final   Immature Granulocytes 02/10/2024 1  % Final   Abs Immature Granulocytes 02/10/2024 0.08 (H)  0.00 - 0.07 K/uL Final   Performed at [facility], [address] [place], [place] [address]   Sodium 02/10/2024 136  135 - 145 mmol/L Final   Potassium 02/10/2024 3.9  3.5 - 5.1 mmol/L Final   Chloride 02/10/2024 101  98 - 111 mmol/L Final   CO2 02/10/2024 25  22 - 32 mmol/L Final   Glucose, Bld 02/10/2024 94  70 - 99 mg/dL Final   Glucose reference range applies only to samples taken after fasting for at least 8 hours.   BUN 02/10/2024 7  6 - 20 mg/dL Final   Creatinine, Ser 02/10/2024 0.66  0.44 - 1.00 mg/dL Final   Calcium 93/84/7974 8.7 (L)  8.9 - 10.3 mg/dL Final   Total Protein 93/84/7974 6.8  6.5 - 8.1 g/dL Final   Albumin 93/84/7974 3.2 (L)  3.5 - 5.0 g/dL Final   AST 93/84/7974 215 (H)  15 - 41  U/L Final   ALT 02/10/2024 143 (H)  0 - 44 U/L Final   Alkaline Phosphatase 02/10/2024 82  38 - 126 U/L Final   Total Bilirubin 02/10/2024 1.0  0.0 - 1.2 mg/dL Final   GFR, Estimated 02/10/2024 >60  >60 mL/min Final   Comment: (NOTE) Calculated using the CKD-EPI Creatinine Equation (2021)    Anion gap 02/10/2024 10  5 - 15 Final   Performed at [facility], [address] [place], [place] [address]   Lipase 02/10/2024 25  11 - 51 U/L Final   Performed at [facility], [address], [place], [place] [address]   Specimen Source 02/10/2024 URINE, CLEAN CATCH   Final   Color, Urine 02/10/2024 YELLOW  YELLOW Final   APPearance 02/10/2024 CLOUDY (A)  CLEAR  Final   Specific Gravity, Urine 02/10/2024 1.015  1.005 - 1.030 Final   pH 02/10/2024 7.0  5.0 - 8.0 Final   Glucose, UA 02/10/2024 NEGATIVE  NEGATIVE mg/dL Final   Hgb urine dipstick 02/10/2024 NEGATIVE  NEGATIVE Final   Bilirubin Urine 02/10/2024 NEGATIVE  NEGATIVE Final   Ketones, ur 02/10/2024 NEGATIVE  NEGATIVE mg/dL Final   Protein, ur 93/84/7974 30 (A)  NEGATIVE mg/dL Final   Nitrite 93/84/7974 POSITIVE (A)  NEGATIVE Final   Leukocytes,Ua 02/10/2024 MODERATE (A)  NEGATIVE Final   RBC / HPF 02/10/2024 0-5  0 - 5 RBC/hpf Final   WBC, UA 02/10/2024 21-50  0 - 5 WBC/hpf Final   Comment:        Reflex urine culture not performed if WBC <=10, OR if Squamous epithelial cells >5. If Squamous epithelial cells >5 suggest recollection.    Bacteria, UA 02/10/2024 FEW (A)  NONE SEEN Final   Squamous Epithelial / HPF 02/10/2024 0-5  0 - 5 /HPF Final   Mucus 02/10/2024 PRESENT   Final   Amorphous Crystal 02/10/2024 PRESENT   Final   Performed at [facility], [address], [place], [place] [address]   Preg Test, Ur 02/10/2024 NEGATIVE  NEGATIVE Final   Comment:        THE SENSITIVITY OF THIS METHODOLOGY IS >25 mIU/mL. Performed at [facility], [address] [place], [place] [address]    Specimen  Description 02/10/2024 BLOOD RIGHT FOREARM   Final   Special Requests 02/10/2024 BOTTLES DRAWN AEROBIC AND ANAEROBIC Blood Culture adequate volume   Final   Culture 02/10/2024    Final                   Value:NO GROWTH 5 DAYS Performed at [facility], [address], [place], [place] [address]    Report Status 02/10/2024 02/15/2024 FINAL   Final   Specimen Description 02/10/2024 BLOOD LEFT ANTECUBITAL   Final   Special Requests 02/10/2024 BOTTLES DRAWN AEROBIC AND ANAEROBIC Blood Culture results may not be optimal due to an inadequate volume of blood received in culture bottles   Final   Culture 02/10/2024    Final                   Value:NO GROWTH 5 DAYS Performed at [facility], [address], [place], [place] [address]    Report Status 02/10/2024 02/15/2024 FINAL   Final   Neisseria Gonorrhea 02/10/2024 Positive (A)   Final   Chlamydia 02/10/2024 Negative   Final   Comment 02/10/2024 Normal Reference [facility] Chlamydia - Negative   Final   Comment 02/10/2024 Normal Reference Range Neisseria Gonorrhea - Negative   Final   Yeast Wet Prep HPF POC 02/10/2024 NONE SEEN  NONE SEEN Final   Trich, Wet Prep 02/10/2024 NONE SEEN  NONE SEEN Final   Clue Cells Wet Prep HPF POC 02/10/2024 NONE SEEN  NONE SEEN Final   WBC, Wet Prep HPF POC 02/10/2024 >=10 (A)  <10 Final   Sperm 02/10/2024 NONE SEEN   Final   Performed at [facility], [address] [place], [place] [address]   HIV Screen 4th Generation wRfx 02/10/2024 Non Reactive  Non Reactive Final   Performed at [facility], [address], [place], [place] [address]   RPR Ser Ql 02/10/2024 NON REACTIVE  NON REACTIVE Final   Performed at [facility], [address], [place], [place] [address]   Hepatitis B Surface Ag 02/10/2024 NON REACTIVE  NON REACTIVE Final   HCV Ab 02/10/2024 Reactive (A)  NON REACTIVE Final   Comment: (NOTE) The CDC recommends that a Reactive HCV antibody result be followed up  with a  HCV Nucleic Acid Amplification test.     Hep A IgM 02/10/2024 NON REACTIVE  NON REACTIVE Final   Hep B C IgM 02/10/2024 NON REACTIVE  NON REACTIVE Final   Performed at [facility], [address], [place], [place] [address]   Specimen Description 02/10/2024 URINE, RANDOM   Final   Special Requests 02/10/2024    Final                   Value:NONE Reflexed from [id number] Performed at [facility], [address] [place], [place] [address]    Culture 02/10/2024 MULTIPLE SPECIES PRESENT, SUGGEST RECOLLECTION (A)   Final   Report Status 02/10/2024 02/11/2024 FINAL   Final   WBC 02/11/2024 17.9 (H)  4.0 - 10.5 K/uL Final   RBC 02/11/2024 4.32  3.87 - 5.11 MIL/uL Final   Hemoglobin 02/11/2024 12.4  12.0 - 15.0 g/dL Final   HCT 93/83/7974 38.1  36.0 - 46.0 % Final   MCV 02/11/2024 88.2  80.0 - 100.0 fL Final   MCH 02/11/2024 28.7  26.0 - 34.0 pg Final   MCHC 02/11/2024 32.5  30.0 - 36.0 g/dL Final   RDW 93/83/7974 14.9  11.5 - 15.5 % Final   Platelets 02/11/2024 328  150 - 400 K/uL Final   nRBC 02/11/2024 0.0  0.0 - 0.2 % Final   Performed at [facility], [address] [place], [place] [address]   Sodium 02/11/2024 134 (L)  135 - 145 mmol/L Final   Potassium 02/11/2024 4.1  3.5 - 5.1 mmol/L Final   Chloride 02/11/2024 103  98 - 111 mmol/L Final   CO2 02/11/2024 23  22 - 32 mmol/L Final   Glucose, Bld 02/11/2024 245 (H)  70 - 99 mg/dL Final   Glucose reference range applies only to samples taken after fasting for at least 8 hours.   BUN 02/11/2024 9  6 - 20 mg/dL Final   Creatinine, Ser 02/11/2024 0.76  0.44 - 1.00 mg/dL Final   Calcium 93/83/7974 7.9 (L)  8.9 - 10.3 mg/dL Final   Total Protein 93/83/7974 5.7 (L)  6.5 - 8.1 g/dL Final   Albumin 93/83/7974 2.3 (L)  3.5 - 5.0 g/dL Final   AST 93/83/7974 109 (H)  15 - 41 U/L Final   ALT 02/11/2024 95 (H)  0 - 44 U/L Final   Alkaline Phosphatase 02/11/2024 72  38 - 126 U/L Final   Total Bilirubin 02/11/2024 0.7  0.0 - 1.2  mg/dL Final   GFR, Estimated 02/11/2024 >60  >60 mL/min Final   Comment: (NOTE) Calculated using the CKD-EPI Creatinine Equation (2021)    Anion gap 02/11/2024 8  5 - 15 Final   Performed at [facility], [address] [place], [place] [address]   HCV RNA Qnt(log copy/mL) 02/11/2024 6.778  log10 IU/mL Corrected   HepC Qn 02/11/2024 6,000,000  IU/mL Final   Test Information 02/11/2024 Comment   Final   Comment: (NOTE) The quantitative range of this assay is 15 IU/mL to 100 million IU/mL.    Hcv Genotype 02/11/2024 Comment   Final   Comment: (NOTE) To be performed on this specimen. Performed  At: [facility] [address] [place], [place] [id number] Jennette Shorter MD Ey:[id number]    aPTT 02/11/2024 37 (H)  24 - 36 seconds Final   Comment:        IF BASELINE aPTT IS ELEVATED, SUGGEST PATIENT RISK ASSESSMENT BE USED TO DETERMINE APPROPRIATE ANTICOAGULANT THERAPY. Performed at [facility], [address], [place], [place] [address]    Prothrombin Time 02/11/2024 15.4 (H)  11.4 - 15.2 seconds Final   INR 02/11/2024 1.2  0.8 - 1.2 Final   Comment: (NOTE) INR goal varies based on device and disease states. Performed at [facility], [address] [place], [place] [address]    Fibrinogen  02/11/2024 524 (H)  210 - 475 mg/dL Final   Comment: (NOTE) Fibrinogen  results may be underestimated in patients receiving thrombolytic therapy. Performed at [facility], [address], [place], [place] [address]    TSH 02/11/2024 0.373  0.350 - 4.500 uIU/mL Final   Comment: Performed by a 3rd Generation assay with a functional sensitivity of <=0.01 uIU/mL. Performed at [facility], [address] [place], [place] [address]    WBC 02/12/2024 9.0  4.0 - 10.5 K/uL Final   RBC 02/12/2024 4.32  3.87 - 5.11 MIL/uL Final   Hemoglobin 02/12/2024 12.6  12.0 - 15.0 g/dL Final   HCT 93/82/7974 38.4  36.0 - 46.0 % Final   MCV 02/12/2024 88.9  80.0 -  100.0 fL Final   MCH 02/12/2024 29.2  26.0 - 34.0 pg Final   MCHC 02/12/2024 32.8  30.0 - 36.0 g/dL Final   RDW 93/82/7974 14.9  11.5 - 15.5 % Final   Platelets 02/12/2024 340  150 - 400 K/uL Final   nRBC 02/12/2024 0.0  0.0 - 0.2 % Final   Performed at [facility], [address], [place], [place] [address]   Sodium 02/12/2024 138  135 - 145 mmol/L Final   Potassium 02/12/2024 3.9  3.5 - 5.1 mmol/L Final   Chloride 02/12/2024 106  98 - 111 mmol/L Final   CO2 02/12/2024 22  22 - 32 mmol/L Final   Glucose, Bld 02/12/2024 147 (H)  70 - 99 mg/dL Final   Glucose reference range applies only to samples taken after fasting for at least 8 hours.   BUN 02/12/2024 10  6 - 20 mg/dL Final   Creatinine, Ser 02/12/2024 0.66  0.44 - 1.00 mg/dL Final   Calcium 93/82/7974 8.2 (L)  8.9 - 10.3 mg/dL Final   Total Protein 93/82/7974 5.9 (L)  6.5 - 8.1 g/dL Final   Albumin 93/82/7974 2.2 (L)  3.5 - 5.0 g/dL Final   AST 93/82/7974 133 (H)  15 - 41 U/L Final   ALT 02/12/2024 93 (H)  0 - 44 U/L Final   Alkaline Phosphatase 02/12/2024 63  38 - 126 U/L Final   Total Bilirubin 02/12/2024 0.4  0.0 - 1.2 mg/dL Final   GFR, Estimated 02/12/2024 >60  >60 mL/min Final   Comment: (NOTE) Calculated using the CKD-EPI Creatinine Equation (2021)    Anion gap 02/12/2024 10  5 - 15 Final   Performed at [facility], [address] [place], [place] [address]   Hgb A1c MFr Bld 02/12/2024 5.4  4.8 - 5.6 % Final   Comment: (NOTE) Diagnosis of Diabetes The following HbA1c ranges recommended by the American Diabetes Association (ADA) may be used as an aid in the diagnosis of diabetes mellitus.  Hemoglobin  Suggested A1C NGSP%              Diagnosis  <5.7                   Non Diabetic  5.7-6.4                Pre-Diabetic  >6.4                   Diabetic  <7.0                   Glycemic control for                       adults with diabetes.     Mean Plasma Glucose 02/12/2024 108.28   mg/dL Final   Performed at [facility], [address], [place], [place] [address]   Hepatitis C Genotype 02/11/2024 1a   Final   Comment: (NOTE) This test was developed and its performance characteristics determined by [facility]. It has not been cleared or approved by the Food and Drug Administration. Performed At: [facility] [address] [place], [place] [id number] Jennette Shorter MD Ey:[id number]     Blood Alcohol level:  Lab Results  Component Value Date   [facility] <15 07/08/2024    Metabolic Disorder Labs: Lab Results  Component Value Date   HGBA1C 5.5 07/08/2024   MPG 111.15 07/08/2024   MPG 108.28 02/12/2024   No results found for: PROLACTIN Lab Results  Component Value Date   CHOL 174 07/08/2024   TRIG 102 07/08/2024   HDL 42 07/08/2024   CHOLHDL 4.1 07/08/2024   VLDL 20 07/08/2024   LDLCALC 112 (H) 07/08/2024   LDLCALC 73 09/22/2020    Therapeutic Lab Levels: No results found for: LITHIUM No results found for: VALPROATE No results found for: CBMZ  Physical Findings   GAD-7    Flowsheet Row Procedure visit from 11/01/2020 in [facility] for [facility] at [facility] Office Visit from 09/22/2020 in Center for [employer] [facility] Clinical Support from 06/21/2020 in [facility] for [employer] [facility] Clinical Support from 04/05/2020 in [facility] for [employer] [facility] [employer] [facility] [facility] Clinical Support from 01/19/2020 in Center for [facility] at [facility]  Total GAD-7 Score 7 2 10 8  0   PHQ2-9    Flowsheet Row ED from 07/10/2024 in [facility] ED from 07/08/2024 in [facility] Procedure visit from 11/01/2020 in [facility] for [facility] at [facility] Office Visit from 09/22/2020 in [facility] for [facility] at [facility]  Women Clinical Support from 06/21/2020 in Center for [employer] [facility] at [facility]  PHQ-2 Total Score 2 4 0 0 2  PHQ-9 Total Score 7 12 0 2 10   Flowsheet Row ED from 07/10/2024 in [facility] ED from 07/08/2024 in [facility] ED from 05/17/2024 in [facility] at [facility]  C-SSRS RISK CATEGORY No Risk No Risk No Risk     Musculoskeletal  Strength & Muscle Tone: within normal limits Gait & Station: normal Patient leans: N/A  Psychiatric Specialty Exam  Presentation  General Appearance:  Appropriate for Environment  Eye Contact: Good  Speech: Clear and Coherent  Speech Volume: Normal  Handedness:  Right   Mood and Affect  Mood: Anxious  Affect: Congruent   Thought Process  Thought Processes: Coherent  Descriptions of Associations:Intact  Orientation:Full (Time, Place and Person)  Thought Content:Logical  Diagnosis of Schizophrenia or Schizoaffective disorder in past: No    Hallucinations:Hallucinations: None  Ideas of Reference:None  Suicidal Thoughts:Suicidal Thoughts: No  Homicidal Thoughts:Homicidal Thoughts: No   Sensorium  Memory: Immediate Good; Immediate Fair  Judgment: Good  Insight: Fair   Art Therapist  Concentration: Fair  Attention Span: Good  Recall: Good  Fund of Knowledge: Fair  Language: Good   Psychomotor Activity  Psychomotor Activity: Psychomotor Activity: Normal   Assets  Assets: Communication Skills; Desire for Improvement   Sleep  Sleep: Sleep: Fair  Estimated Sleeping Duration (Last 24 Hours): 10.75-12.00 hours (Due to Daylight Saving Time, the durations displayed may not accurately represent documentation during the time change interval)  No data recorded  Physical Exam  Physical Exam Vitals and nursing note reviewed.  Neurological:     Mental Status: She is oriented to  person, place, and time.  Psychiatric:        Mood and Affect: Mood normal.        Thought Content: Thought content normal.    Review of Systems  Psychiatric/Behavioral:  Positive for depression and substance abuse. Negative for suicidal ideas. The patient is nervous/anxious and has insomnia.   All other systems reviewed and are negative.  Blood pressure 117/76, pulse 94, temperature 98.4 F (36.9 C), temperature source Oral, resp. rate 18, SpO2 99%. There is no height or weight on file to calculate BMI.  Treatment Plan Summary: Daily contact with patient to assess and evaluate symptoms and progress in treatment and Medication management  Continue with current treatment plan on 07/13/2024 as listed below except were noted  Bipolar disorder: Major depressive disorder: Substance-induced mood disorder:  - Adjustment quetiapine 50 mg twice daily and start quetiapine to 100 mg nightly mood stabilization. - Increase hydroxyzine  from 25 mg to 50 mg 3 times daily as needed for anxiety - Continue trazodone  100 mg as needed nightly for sleep - Continue current agitation protocol per policy - Continue acetaminophen  650 mg every 6 hours as needed for pain   COWs Clonidine 0.1mg  taper continued, completion date of 07/14/2024:   CSW to continue working on discharge disposition Patient encouraged to participate in the therapeutic milieu  Morgan LOISE Kerns, NP 07/13/2024 8:20 AM

## 2024-07-13 NOTE — Group Note (Signed)
 Group Topic: Relapse and Recovery  Group Date: 07/13/2024 Start Time: 1330 End Time: 1400 Facilitators: Clodfelter-Simmons, Catina Nuss LITTIE, NT  Department: [facility]  Number of Participants: 9  Group Focus: relapse prevention Treatment Modality:  Psychoeducation Interventions utilized were problem solving Purpose: relapse prevention strategies  Name: MILIYAH LUPER Date of Birth: March 17, 1981  MR: [id number]    Level of Participation: moderate Quality of Participation: cooperative Interactions with others: gave feedback Mood/Affect: appropriate Triggers (if applicable): loneliness, depression, isolation Cognition: coherent/clear Progress: Moderate Response: Patient ready for dc to [facility].  Plan: patient will be encouraged to work on goals.  Patients Problems:

## 2024-07-13 NOTE — Group Note (Unsigned)
 Group Topic: Relapse and Recovery  Group Date: 07/13/2024 Start Time: 1330 End Time: 1400 Facilitators: Clodfelter-Simmons, Callista Hoh LITTIE, NT  Department: [facility]  Number of Participants: 9  Group Focus: relapse prevention Treatment Modality:  Psychoeducation Interventions utilized were problem solving Purpose: relapse prevention strategies   Name: Morgan Donaldson Date of Birth: Oct 06, 1980  MR: [id number]    Level of Participation: {THERAPIES; PSYCH GROUP PARTICIPATION OZCZO:76008} Quality of Participation: {THERAPIES; PSYCH QUALITY OF PARTICIPATION:23992} Interactions with others: {THERAPIES; PSYCH INTERACTIONS:23993} Mood/Affect: {THERAPIES; PSYCH MOOD/AFFECT:23994} Triggers (if applicable): *** Cognition: {THERAPIES; PSYCH COGNITION:23995} Progress: {THERAPIES; PSYCH PROGRESS:23997} Response: *** Plan: {THERAPIES; PSYCH EOJW:76003}  Patients Problems:  Patient Active Problem List   Diagnosis Date Noted   Polysubstance abuse (HCC) 07/10/2024   Pyosalpingitis 02/12/2024   Hepatitis C antibody detected 02/11/2024   PID (acute pelvic inflammatory disease) 02/10/2024   Gonorrhea 12/10/2023   Group A streptococcal infection 12/09/2023   IV drug abuse (HCC) 12/09/2023   Acute cystitis 12/09/2023   GAD (generalized anxiety disorder) 06/21/2020   Attention deficit hyperactivity disorder (ADHD), 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

## 2024-07-13 NOTE — Group Note (Unsigned)
 Group Topic: Relapse and Recovery  Group Date: 07/13/2024 Start Time: 1330 End Time: 1400 Facilitators: Clodfelter-Simmons, Ailin Rochford L, NT  Department: [facility]  Number of Participants: 9  Group Focus: substance abuse education Treatment Modality:  Psychoeducation Interventions utilized were problem solving Purpose: relapse prevention strategies  Name: Morgan Donaldson Date of Birth: 11-09-1980  MR: [id number]    Level of Participation: {THERAPIES; PSYCH GROUP PARTICIPATION OZCZO:76008} Quality of Participation: {THERAPIES; PSYCH QUALITY OF PARTICIPATION:23992} Interactions with others: {THERAPIES; PSYCH INTERACTIONS:23993} Mood/Affect: {THERAPIES; PSYCH MOOD/AFFECT:23994} Triggers (if applicable): *** Cognition: {THERAPIES; PSYCH COGNITION:23995} Progress: {THERAPIES; PSYCH PROGRESS:23997} Response: *** Plan: {THERAPIES; PSYCH EOJW:76003}  Patients Problems:  Patient Active Problem List   Diagnosis Date Noted  . Opioid dependence with uncomplicated intoxication (HCC) 07/14/2024  . 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  . Elevated liver function tests 07/14/2024  . Opioid use with withdrawal (HCC) 07/14/2024  . Polysubstance abuse (HCC) 07/10/2024  . Pyosalpingitis 02/12/2024  . Hepatitis C antibody detected 02/11/2024  . PID (acute pelvic inflammatory disease) 02/10/2024  . Gonorrhea 12/10/2023  . Group A streptococcal infection 12/09/2023  . IV drug abuse (HCC) 12/09/2023  . Acute cystitis 12/09/2023  . GAD (generalized anxiety disorder) 06/21/2020  . Attention deficit hyperactivity disorder (ADHD), combined type 06/21/2020  . Breakthrough bleeding on depo provera  05/12/2020  . Pap smear abnormality of cervix/human papillomavirus (HPV) positive 08/17/2019  . Hemorrhagic cyst of left ovary 08/11/2019  . Tobacco abuse 05/19/2019  . History of drug abuse in remission (HCC) 05/19/2019

## 2024-07-14 DIAGNOSIS — F411 Generalized anxiety disorder: Secondary | ICD-10-CM | POA: Diagnosis not present

## 2024-07-14 DIAGNOSIS — F11220 Opioid dependence with intoxication, uncomplicated: Secondary | ICD-10-CM | POA: Diagnosis not present

## 2024-07-14 DIAGNOSIS — R7989 Other specified abnormal findings of blood chemistry: Secondary | ICD-10-CM

## 2024-07-14 DIAGNOSIS — F1420 Cocaine dependence, uncomplicated: Secondary | ICD-10-CM | POA: Diagnosis not present

## 2024-07-14 DIAGNOSIS — F1924 Other psychoactive substance dependence with psychoactive substance-induced mood disorder: Secondary | ICD-10-CM | POA: Diagnosis not present

## 2024-07-14 DIAGNOSIS — F1994 Other psychoactive substance use, unspecified with psychoactive substance-induced mood disorder: Secondary | ICD-10-CM

## 2024-07-14 DIAGNOSIS — F1193 Opioid use, unspecified with withdrawal: Secondary | ICD-10-CM

## 2024-07-14 LAB — GC/CHLAMYDIA PROBE AMP (~~LOC~~) NOT AT ARMC
Chlamydia: NEGATIVE
Comment: NEGATIVE
Comment: NORMAL
Neisseria Gonorrhea: NEGATIVE

## 2024-07-14 MED ORDER — BUSPIRONE HCL 15 MG PO TABS
15.0000 mg | ORAL_TABLET | Freq: Two times a day (BID) | ORAL | Status: DC
  Administered 2024-07-14 – 2024-07-16 (×5): 15 mg via ORAL
  Filled 2024-07-14 (×5): qty 1

## 2024-07-14 MED ORDER — QUETIAPINE FUMARATE 200 MG PO TABS
200.0000 mg | ORAL_TABLET | Freq: Every day | ORAL | Status: DC
  Administered 2024-07-14: 200 mg via ORAL
  Filled 2024-07-14: qty 1

## 2024-07-14 MED ORDER — QUETIAPINE FUMARATE 100 MG PO TABS
100.0000 mg | ORAL_TABLET | ORAL | Status: DC
  Administered 2024-07-15 – 2024-07-22 (×8): 100 mg via ORAL
  Filled 2024-07-14: qty 1
  Filled 2024-07-14: qty 28
  Filled 2024-07-14 (×7): qty 1

## 2024-07-14 NOTE — Group Note (Signed)
 Group Topic: Communication  Group Date: 07/14/2024 Start Time: 1200 End Time: 1230 Facilitators: Wilfred Pinch, RN; Daved Tinnie HERO, RN  Department: [facility]  Number of Participants:  9 Group Focus: acceptance, affirmation, anger management, and anxiety Treatment Modality:  Psychoeducation Interventions utilized were clarification, group exercise, and support Purpose: enhance coping skills and regain self-worth  Name: Morgan Donaldson Date of Birth: [birth year]  MR: [id number]    Level of Participation: active Quality of Participation: engaged Interactions with others: gave feedback Mood/Affect: appropriate Triggers (if applicable): n/a Cognition: coherent/clear Progress: Gaining insight Response: appropriate Plan: patient will be encouraged to evaluate source of anger and taking the time to think about something before acting on it.   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

## 2024-07-14 NOTE — ED Notes (Signed)
Patient sleeping with no s/s of distress.

## 2024-07-14 NOTE — ED Notes (Signed)
 Observed patient in dinning area. Pt is AOX4, denies pain or discomfort att. Denies SI/HI and AVH. Safety measures and POC cont. States sister and boyfriend are good support system. Plan is to go d/c 11/20 to [facility], per pt.

## 2024-07-14 NOTE — ED Notes (Signed)
 RPR lab work discussed with pt as requested, further questions denied. Pt ate lunch, no concerns or complaints reported to RN at this time. Pt generally cooperative, engaged in group.

## 2024-07-14 NOTE — Discharge Instructions (Addendum)
 [facility] Care Management...  Writer coordinated inpatient treatment ...  Patient scheduled to discharge 11/25/205  Patient will discharge to...  [facility] [address] [place], [place] [address] Phone: [phone]   RN will arrange transportation  Patient has been accepted to [facility] appointment scheduled for 07/28/2024 @ 9:00 am  [facility] [address] [place], [place] [address] [phone]  Writer coordinated appointment with Harlene, Peer Support Specialist to transport patient to [facility]  Patient will coordinate with [facility] Peer Support Specialist to re-connect with [facility] for Suboxone  Treatment   7 day samples and scripts

## 2024-07-14 NOTE — Group Note (Signed)
 Group Topic: Social Support  Group Date: 07/14/2024 Start Time: 2000 End Time: 2030 Facilitators: Joan Plowman B  Department: [facility]  Number of Participants: 7  Group Focus: abuse issues and check in Treatment Modality:  Individual Therapy and Spiritual Interventions utilized were patient education and support Purpose: express feelings, increase insight, and relapse prevention strategies  Name: Morgan Donaldson Date of Birth: [birth year]  MR: [id number]    Level of Participation: active Quality of Participation: attentive and cooperative Interactions with others: gave feedback Mood/Affect: appropriate Triggers (if applicable): NA Cognition: coherent/clear Progress: Gaining insight Response: NA Plan: patient will be encouraged to keep going to groups  Patients Problems:  Patient Active Problem List   Diagnosis Date Noted   Opioid dependence with uncomplicated intoxication (HCC) 07/14/2024   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   Elevated liver function tests 07/14/2024   Opioid use with withdrawal (HCC) 07/14/2024   Polysubstance abuse (HCC) 07/10/2024   Pyosalpingitis 02/12/2024   Hepatitis C antibody detected 02/11/2024   PID (acute pelvic inflammatory disease) 02/10/2024   Gonorrhea 12/10/2023   Group A streptococcal infection 12/09/2023   IV drug abuse (HCC) 12/09/2023   Acute cystitis 12/09/2023   GAD (generalized anxiety disorder) 06/21/2020   Attention deficit hyperactivity disorder (ADHD), 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

## 2024-07-14 NOTE — Group Note (Signed)
 Group Topic: Recovery Basics  Group Date: 07/14/2024 Start Time: 1000 End Time: 1100 Facilitators: Elnor Keven SAILOR  Department: [facility]  Number of Participants: 9  Group Focus: abuse issues Treatment Modality:  Psychoeducation Interventions utilized were support Purpose: express feelings  Name: Morgan Donaldson Date of Birth: Sep 10, 1980  MR: [id number]    Level of Participation: Pt did not attend group Quality of Participation: NA Interactions with others: NA Mood/Affect: appropriate Triggers (if applicable): NA Cognition: NA Progress: none Response: Pt did not attend group Plan: follow-up needed  Patients Problems:  Patient Active Problem List   Diagnosis Date Noted   Polysubstance abuse (HCC) 07/10/2024   Pyosalpingitis 02/12/2024   Hepatitis C antibody detected 02/11/2024   PID (acute pelvic inflammatory disease) 02/10/2024   Gonorrhea 12/10/2023   Group A streptococcal infection 12/09/2023   IV drug abuse (HCC) 12/09/2023   Acute cystitis 12/09/2023   GAD (generalized anxiety disorder) 06/21/2020   Attention deficit hyperactivity disorder (ADHD), 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

## 2024-07-14 NOTE — ED Provider Notes (Signed)
 Behavioral Health Progress Note  Date and Time: 07/14/2024 3:29 PM Name: Morgan Donaldson MRN:  [id number]  Subjective:  Patient reports fairly good sleep notable for impaired initiation but no middle insomnia. She inquires about what would work better than quetiapine/trazodone . Quetiapine is her primary bipolar medication. She takes buspirone for anxiety/panic but questions how well it works. Patient open to further titration of quetiapine for mood/anxiety/sleep but is interested in other mood stabilizers. Patient smokes 2ppd and took off patch to shower. She was unaware that nicotine  gum is parked instead of chewed. Despite recent fentanyl , crack cocaine, and marijuana (which she doesn't consider a drug) use, most prominent craving in the moment is cigarettes. Anticipate transition to [facility] Relapse Prevention program in [place] on 11/20 followed by admission to [facility] on 12/1.  Diagnosis:  Final diagnoses:  Polysubstance abuse (HCC)  Opioid dependence with uncomplicated intoxication (HCC)  Drug-induced mood disorder (HCC)  Elevated liver function tests  Opioid use with withdrawal (HCC)  Hyperthyroidism  Generalized anxiety disorder   Total Time spent with patient: I personally spent 30 minutes on the unit in direct patient care. The direct patient care time included face-to-face time with the patient, reviewing the patient's chart, communicating with other professionals, and coordinating care. Greater than 50% of this time was spent in counseling or coordinating care with the patient regarding goals of hospitalization, psycho-education, and discharge planning needs.   On my assessment the patient denied SI, HI, AVH, paranoia, ideas of reference, or first rank symptoms. Patient denied drug cravings (except nicotine ) or active signs of withdrawal. Patient denied medication side-effects. Patient was not deemed to be a danger to self or others.    Provider Admission HPI:  Morgan Donaldson is  a 43 year old female who presented  to [facility] with a history of Opioid Use Disorder, severe and Stimulant Use Disorder, cocaine type, severe along with untreated Bipolar Disorder. Pt was transferred to Facility Based Crisis unit for detox from Fentanyl  and crack cocaine. UDS positive for cocaine, morphine  and THC. BAL upon admission is negative.    Patient reported she has been using 1/2 gram of fentanyl  IV for the past year.  She has also been smoking approximately 1/2 gram of crack cocaine daily for the past year.  Patient stated she is done with using, stating she had this realization when she woke up [place] a bridge this morning.  Patient has been homeless for the past year.  She reported  she has a boyfriend, and things are going well outside of patient's substance use issues.  Patient stated her boyfriend is supportive of her seeking treatment and will allow her to stay with him once she is clean and has completed treatment.  Patient reported  she is also estranged from her sister and other family members due to her ongoing issues with substance use.  She is hopeful that after treatment she can reconnect with family.    Subjective: Upon assessment today patient is observed in the day room watching TV and talking with peers.  Patient reports decreased sleep last night and has an increased appetite.  Patient denies suicidal ideations, homicidal ideations and hallucinations. She endorses current withdrawal symptoms including anxiety, body aches, nausea and runny nose.  Patient reports history of bipolar disorder and is not currently taking any medications.  She reports previously taking Seroquel 75 mg 3 times daily for mood stabilization.  Patient reports ongoing anxiety and initially requests Ativan  as needed for anxiety.  After  educating patient on this medication and its risks for dependency, patient agreed to trial an increase of hydroxyzine .  Patient is currently voluntary and is  hopeful for residential treatment after detox.  Patient inquires about Suboxone but is unsure at this time due to lack of follow-up and being unsure if she will be able to get into a residential treatment facility due to her insurance.  Will further discuss at patient's discretion.  Patient denies any physical complaints or side effects from medications this time.  Patient encouraged to engage in group programming and assessments with staff.  Patient is pleasant and cooperative, agreeable to treatment plan.  Past Psychiatric History: Bipolar d/o, PTSD, and polysubstance abuse.  Past Medical History:      Patient Active Problem List    Diagnosis Date Noted   Polysubstance abuse (HCC) 07/10/2024   Pyosalpingitis 02/12/2024   Hepatitis C antibody detected 02/11/2024   PID (acute pelvic inflammatory disease) 02/10/2024   Gonorrhea 12/10/2023   Group A streptococcal infection 12/09/2023   IV drug abuse (HCC) 12/09/2023   Acute cystitis 12/09/2023   GAD (generalized anxiety disorder) 06/21/2020   Attention deficit hyperactivity disorder (ADHD), 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        Past Medical History:  Diagnosis Date   Bronchitis     Hydrosalpinx      bilateral   Palpitations     Physiological ovarian cysts          Family History:       Family History  Problem Relation Age of Onset   Lung cancer Mother          died at age 77   CAD Mother          MI at age 67   Hypertension Mother     Schizophrenia Father     Hypertension Sister     Hyperlipidemia Sister     COPD Sister     Healthy Brother     Healthy Sister          Social History: Currently homeless, unemployed and in a relationship. She reports plan to live with  boyfriend after finishing residential treatment. She endorses fentanyl , cocaine and THC use.   Sleep: Fair  Appetite:  Fair  Current Medications:  Current Facility-Administered Medications  Medication Dose Route Frequency Provider Last Rate Last Admin   acetaminophen  (TYLENOL ) tablet 650 mg  650 mg Oral Q6H PRN Randall Starlyn HERO, NP   650 mg at 07/13/24 0836   alum & mag hydroxide-simeth (MAALOX/MYLANTA) 200-200-20 MG/5ML suspension 30 mL  30 mL Oral Q4H PRN Randall Starlyn HERO, NP   30 mL at 07/13/24 1322   busPIRone (BUSPAR) tablet 15 mg  15 mg Oral BID Cole Kandi BROCKS, MD       haloperidol  (HALDOL ) tablet 5 mg  5 mg Oral TID PRN Randall Starlyn HERO, NP   5 mg at 07/13/24 1935   And   diphenhydrAMINE  (BENADRYL ) capsule 50 mg  50 mg Oral TID PRN Randall Starlyn HERO, NP   50 mg at 07/13/24 1934   haloperidol  lactate (HALDOL ) injection 5 mg  5 mg Intramuscular TID PRN Randall Starlyn HERO, NP       And   diphenhydrAMINE  (BENADRYL ) injection 50 mg  50 mg Intramuscular TID PRN Randall Starlyn HERO, NP       And   LORazepam  (ATIVAN ) injection 2 mg  2 mg Intramuscular TID PRN Byungura, Veronique M, NP       haloperidol  lactate (HALDOL ) injection 10 mg  10 mg Intramuscular TID PRN Randall Starlyn HERO, NP       And   diphenhydrAMINE  (BENADRYL ) injection 50 mg  50 mg Intramuscular TID PRN Randall Starlyn HERO, NP       And   LORazepam  (ATIVAN ) injection 2 mg  2 mg Intramuscular TID PRN Randall, Veronique M, NP       feeding supplement (ENSURE PLUS HIGH PROTEIN) liquid 237 mL  237 mL Oral TID BM Gottfried, Rhoda J, MD   237 mL at 07/14/24 1338   magnesium  hydroxide (MILK OF MAGNESIA) suspension 30 mL  30 mL Oral Daily PRN Randall Starlyn HERO, NP       nicotine  (NICODERM CQ  - dosed in mg/24 hours) patch 21 mg  21 mg Transdermal Daily Byungura, Veronique M, NP   21 mg at 07/14/24 9080   nicotine  polacrilex (NICORETTE) gum 2 mg  2 mg Oral PRN Gottfried, Rhoda J, MD   2 mg at  07/13/24 1001   nitrofurantoin (macrocrystal-monohydrate) (MACROBID) capsule 100 mg  100 mg Oral Q12H Lewis, Tanika N, NP   100 mg at 07/14/24 0918   [START ON 07/15/2024] QUEtiapine (SEROQUEL) tablet 100 mg  100 mg Oral Landrum Hahn, Xylina Rhoads C, MD       QUEtiapine (SEROQUEL) tablet 200 mg  200 mg Oral QHS Hahn Kandi BROCKS, MD       traZODone  (DESYREL ) tablet 100 mg  100 mg Oral QHS PRN Byungura, Veronique M, NP   100 mg at 07/13/24 2115   No current outpatient medications on file.    Labs  Lab Results:  Admission on 07/10/2024  Component Date Value Ref Range Status   T3, Free 07/11/2024 2.2  2.0 - 4.4 pg/mL Final   Comment: (NOTE) Performed At: [facility] [address] [place], [place] [id number] Jennette Shorter MD [id number]    Free T4 07/11/2024 0.71  0.61 - 1.12 ng/dL Final   Comment: (NOTE) Biotin ingestion may interfere with free T4 tests. If the results are inconsistent with the TSH level, previous test results, or the clinical presentation, then consider biotin interference. If needed, order repeat testing after stopping biotin. Performed at [facility], [address], [place], [place] [address]    Total Protein 07/12/2024 7.1  6.5 - 8.1 g/dL Final   Albumin 88/84/7974 3.3 (L)  3.5 - 5.0 g/dL Final   AST 88/84/7974 67 (H)  15 - 41 U/L Final   ALT 07/12/2024 95 (H)  0 - 44 U/L Final   Alkaline Phosphatase 07/12/2024 110  38 - 126 U/L Final   Total Bilirubin 07/12/2024 0.6  0.0 - 1.2 mg/dL Final   Bilirubin, Direct 07/12/2024 <0.1  0.0 - 0.2 mg/dL Final   Indirect Bilirubin 07/12/2024 NOT CALCULATED  0.3 - 0.9 mg/dL Final   Performed at [facility], [address], [place], [place] [address]   RPR Ser Ql 07/12/2024 NON REACTIVE  NON REACTIVE Final   Performed at [facility], [address] [place], [place] [address]  Admission on 07/08/2024, Discharged on 07/10/2024  Component Date Value Ref Range Status   WBC 07/08/2024 10.6 (H)   4.0 - 10.5 K/uL Final   RBC 07/08/2024 5.65 (H)  3.87 - 5.11 MIL/uL Final   Hemoglobin 07/08/2024 16.5 (H)  12.0 - 15.0 g/dL Final   HCT 88/88/7974 49.4 (H)  36.0 - 46.0 % Final   MCV 07/08/2024 87.4  80.0 - 100.0 fL Final   MCH 07/08/2024 29.2  26.0 - 34.0 pg Final   MCHC 07/08/2024 33.4  30.0 - 36.0 g/dL Final   RDW 88/88/7974 13.7  11.5 - 15.5 % Final   Platelets 07/08/2024 422 (H)  150 - 400 K/uL Final   nRBC 07/08/2024 0.0  0.0 - 0.2 % Final   Neutrophils Relative % 07/08/2024 75  % Final   Neutro Abs 07/08/2024 7.9 (H)  1.7 - 7.7 K/uL Final   Lymphocytes Relative 07/08/2024 17  % Final   Lymphs Abs 07/08/2024 1.8  0.7 - 4.0 K/uL Final   Monocytes Relative 07/08/2024 7  % Final   Monocytes Absolute 07/08/2024 0.7  0.1 - 1.0 K/uL Final   Eosinophils Relative 07/08/2024 0  % Final   Eosinophils Absolute 07/08/2024 0.0  0.0 - 0.5 K/uL Final   Basophils Relative 07/08/2024 1  % Final   Basophils Absolute 07/08/2024 0.1  0.0 - 0.1 K/uL Final   Immature Granulocytes 07/08/2024 0  % Final   Abs Immature Granulocytes 07/08/2024 0.04  0.00 - 0.07 K/uL Final   Performed at [facility], [address], [place], [place] [address]   Sodium 07/08/2024 140  135 - 145 mmol/L Final   Potassium 07/08/2024 4.1  3.5 - 5.1 mmol/L Final   Chloride 07/08/2024 100  98 - 111 mmol/L Final   CO2 07/08/2024 27  22 - 32 mmol/L Final   Glucose, Bld 07/08/2024 113 (H)  70 - 99 mg/dL Final   Glucose reference range applies only to samples taken after fasting for at least 8 hours.   BUN 07/08/2024 10  6 - 20 mg/dL Final   Creatinine, Ser 07/08/2024 0.74  0.44 - 1.00 mg/dL Final   Calcium 88/88/7974 9.9  8.9 - 10.3 mg/dL Final   Total Protein 88/88/7974 7.9  6.5 - 8.1 g/dL Final   Albumin 88/88/7974 3.5  3.5 - 5.0 g/dL Final   AST 88/88/7974 109 (H)  15 - 41 U/L Final   ALT 07/08/2024 132 (H)  0 - 44 U/L Final   Alkaline Phosphatase 07/08/2024 74  38 - 126 U/L Final   Total Bilirubin 07/08/2024 0.3   0.0 - 1.2 mg/dL Final   GFR, Estimated 07/08/2024 >60  >60 mL/min Final   Comment: (NOTE) Calculated using the CKD-EPI Creatinine Equation (2021)    Anion gap 07/08/2024 13  5 - 15 Final   Performed at [facility], [address] [place], [place] [address]   Hgb A1c MFr Bld 07/08/2024 5.5  4.8 - 5.6 % Final   Comment: (NOTE) Diagnosis of Diabetes The following HbA1c ranges recommended by the American Diabetes Association (ADA) may be used as an aid in the diagnosis of diabetes mellitus.  Hemoglobin             Suggested A1C NGSP%              Diagnosis  <5.7                   Non Diabetic  5.7-6.4                Pre-Diabetic  >6.4  Diabetic  <7.0                   Glycemic control for                       adults with diabetes.     Mean Plasma Glucose 07/08/2024 111.15  mg/dL Final   Performed at [facility], [address], [place], [place] [address]   Magnesium  07/08/2024 2.1  1.7 - 2.4 mg/dL Final   Performed at [facility], [address] [place], [place] [address]   Alcohol, Ethyl (B) 07/08/2024 <15  <15 mg/dL Final   Comment: (NOTE) For medical purposes only. Performed at [facility], [address] [place], [place] [address]    Cholesterol 07/08/2024 174  0 - 200 mg/dL Final   Triglycerides 88/88/7974 102  <150 mg/dL Final   HDL 88/88/7974 42  >40 mg/dL Final   Total CHOL/HDL Ratio 07/08/2024 4.1  RATIO Final   VLDL 07/08/2024 20  0 - 40 mg/dL Final   LDL Cholesterol 07/08/2024 112 (H)  0 - 99 mg/dL Final   Comment:        Total Cholesterol/HDL:CHD Risk Coronary Heart Disease Risk Table                     Men   Women  1/2 Average Risk   3.4   3.3  Average Risk       5.0   4.4  2 X Average Risk   9.6   7.1  3 X Average Risk  23.4   11.0        Use the calculated Patient Ratio above and the CHD Risk Table to determine the patient's CHD Risk.        ATP III CLASSIFICATION (LDL):  <100     mg/dL   Optimal   899-870  mg/dL   Near or Above                    Optimal  130-159  mg/dL   Borderline  839-810  mg/dL   High  >809     mg/dL   Very High Performed at [facility], [address], [place], [place] [address]    Color, Urine 07/09/2024 YELLOW  YELLOW Final   APPearance 07/09/2024 TURBID (A)  CLEAR Final   Specific Gravity, Urine 07/09/2024 1.017  1.005 - 1.030 Final   pH 07/09/2024 7.0  5.0 - 8.0 Final   Glucose, UA 07/09/2024 NEGATIVE  NEGATIVE mg/dL Final   Hgb urine dipstick 07/09/2024 NEGATIVE  NEGATIVE Final   Bilirubin Urine 07/09/2024 NEGATIVE  NEGATIVE Final   Ketones, ur 07/09/2024 NEGATIVE  NEGATIVE mg/dL Final   Protein, ur 88/87/7974 NEGATIVE  NEGATIVE mg/dL Final   Nitrite 88/87/7974 NEGATIVE  NEGATIVE Final   Leukocytes,Ua 07/09/2024 MODERATE (A)  NEGATIVE Final   RBC / HPF 07/09/2024 0-5  0 - 5 RBC/hpf Final   WBC, UA 07/09/2024 21-50  0 - 5 WBC/hpf Final   Bacteria, UA 07/09/2024 MANY (A)  NONE SEEN Final   Squamous Epithelial / HPF 07/09/2024 0-5  0 - 5 /HPF Final   WBC Clumps 07/09/2024 PRESENT   Final   Performed at [facility], [address], [place], [place] [address]   Preg Test, Ur 07/09/2024 Negative  Negative Final   POC Amphetamine  UR 07/09/2024 None Detected  NONE DETECTED (Cut  Off Level 1000 ng/mL) Final   POC Secobarbital (BAR) 07/09/2024 None Detected  NONE DETECTED (Cut Off Level 300 ng/mL) Final   POC Buprenorphine (BUP) 07/09/2024 None Detected  NONE DETECTED (Cut Off Level 10 ng/mL) Final   POC Oxazepam (BZO) 07/09/2024 None Detected  NONE DETECTED (Cut Off Level 300 ng/mL) Final   POC Cocaine UR 07/09/2024 Positive (A)  NONE DETECTED (Cut Off Level 300 ng/mL) Final   POC Methamphetamine UR 07/09/2024 None Detected  NONE DETECTED (Cut Off Level 1000 ng/mL) Final   POC Morphine  07/09/2024 Positive (A)  NONE DETECTED (Cut Off Level 300 ng/mL) Final   POC Methadone UR 07/09/2024 None Detected  NONE DETECTED (Cut Off Level 300 ng/mL) Final    POC Oxycodone  UR 07/09/2024 None Detected  NONE DETECTED (Cut Off Level 100 ng/mL) Final   POC Marijuana UR 07/09/2024 Positive (A)  NONE DETECTED (Cut Off Level 50 ng/mL) Final   TSH 07/08/2024 0.112 (L)  0.350 - 4.500 uIU/mL Final   Comment: Performed by a 3rd Generation assay with a functional sensitivity of <=0.01 uIU/mL. Performed at [facility], [address] [place], [place] [address]    Preg Test, Ur 07/09/2024 NEGATIVE  NEGATIVE Final   Comment:        THE SENSITIVITY OF THIS METHODOLOGY IS >20 mIU/mL.   Admission on 05/17/2024, Discharged on 05/18/2024  Component Date Value Ref Range Status   WBC 05/17/2024 9.0  4.0 - 10.5 K/uL Final   RBC 05/17/2024 4.35  3.87 - 5.11 MIL/uL Final   Hemoglobin 05/17/2024 12.8  12.0 - 15.0 g/dL Final   HCT 90/79/7974 39.7  36.0 - 46.0 % Final   MCV 05/17/2024 91.3  80.0 - 100.0 fL Final   MCH 05/17/2024 29.4  26.0 - 34.0 pg Final   MCHC 05/17/2024 32.2  30.0 - 36.0 g/dL Final   RDW 90/79/7974 14.1  11.5 - 15.5 % Final   Platelets 05/17/2024 286  150 - 400 K/uL Final   nRBC 05/17/2024 0.0  0.0 - 0.2 % Final   Neutrophils Relative % 05/17/2024 65  % Final   Neutro Abs 05/17/2024 5.8  1.7 - 7.7 K/uL Final   Lymphocytes Relative 05/17/2024 23  % Final   Lymphs Abs 05/17/2024 2.1  0.7 - 4.0 K/uL Final   Monocytes Relative 05/17/2024 8  % Final   Monocytes Absolute 05/17/2024 0.8  0.1 - 1.0 K/uL Final   Eosinophils Relative 05/17/2024 4  % Final   Eosinophils Absolute 05/17/2024 0.3  0.0 - 0.5 K/uL Final   Basophils Relative 05/17/2024 0  % Final   Basophils Absolute 05/17/2024 0.0  0.0 - 0.1 K/uL Final   Immature Granulocytes 05/17/2024 0  % Final   Abs Immature Granulocytes 05/17/2024 0.04  0.00 - 0.07 K/uL Final   Performed at [facility], [address] [place], [place] [address]   Preg, Serum 05/17/2024 NEGATIVE  NEGATIVE Final   Comment:        THE SENSITIVITY OF THIS METHODOLOGY IS >10 mIU/mL. Performed at [facility], [address], [place], [place] [address]    Sodium 05/17/2024 141  135 - 145 mmol/L Final   Potassium 05/17/2024 3.5  3.5 - 5.1 mmol/L Final   Chloride 05/17/2024 104  98 - 111 mmol/L Final   CO2 05/17/2024 26  22 - 32 mmol/L Final   Glucose, Bld 05/17/2024 118 (H)  70 - 99 mg/dL Final   Glucose reference range  applies only to samples taken after fasting for at least 8 hours.   BUN 05/17/2024 11  6 - 20 mg/dL Final   Creatinine, Ser 05/17/2024 0.78  0.44 - 1.00 mg/dL Final   Calcium 90/79/7974 8.8 (L)  8.9 - 10.3 mg/dL Final   Total Protein 90/79/7974 7.0  6.5 - 8.1 g/dL Final   Albumin 90/79/7974 3.0 (L)  3.5 - 5.0 g/dL Final   AST 90/79/7974 115 (H)  15 - 41 U/L Final   ALT 05/17/2024 144 (H)  0 - 44 U/L Final   Alkaline Phosphatase 05/17/2024 71  38 - 126 U/L Final   Total Bilirubin 05/17/2024 0.5  0.0 - 1.2 mg/dL Final   GFR, Estimated 05/17/2024 >60  >60 mL/min Final   Comment: (NOTE) Calculated using the CKD-EPI Creatinine Equation (2021)    Anion gap 05/17/2024 11  5 - 15 Final   Performed at [facility], [address], [place], [place] [address]   Sodium 05/17/2024 142  135 - 145 mmol/L Final   Potassium 05/17/2024 3.5  3.5 - 5.1 mmol/L Final   Chloride 05/17/2024 103  98 - 111 mmol/L Final   BUN 05/17/2024 12  6 - 20 mg/dL Final   Creatinine, Ser 05/17/2024 0.70  0.44 - 1.00 mg/dL Final   Glucose, Bld 90/79/7974 120 (H)  70 - 99 mg/dL Final   Glucose reference range applies only to samples taken after fasting for at least 8 hours.   Calcium, Ion 05/17/2024 1.13 (L)  1.15 - 1.40 mmol/L Final   TCO2 05/17/2024 28  22 - 32 mmol/L Final   Hemoglobin 05/17/2024 11.6 (L)  12.0 - 15.0 g/dL Final   HCT 90/79/7974 34.0 (L)  36.0 - 46.0 % Final  Admission on 03/25/2024, Discharged on 03/25/2024  Component Date Value Ref Range Status   Sodium 03/25/2024 140  135 - 145 mmol/L Final   Potassium 03/25/2024 3.2 (L)  3.5 - 5.1 mmol/L Final   Chloride 03/25/2024 105   98 - 111 mmol/L Final   BUN 03/25/2024 9  6 - 20 mg/dL Final   Creatinine, Ser 03/25/2024 0.70  0.44 - 1.00 mg/dL Final   Glucose, Bld 92/70/7974 132 (H)  70 - 99 mg/dL Final   Glucose reference range applies only to samples taken after fasting for at least 8 hours.   Calcium, Ion 03/25/2024 0.99 (L)  1.15 - 1.40 mmol/L Final   TCO2 03/25/2024 24  22 - 32 mmol/L Final   Hemoglobin 03/25/2024 12.9  12.0 - 15.0 g/dL Final   HCT 92/70/7974 38.0  36.0 - 46.0 % Final   Lactic Acid, Venous 03/25/2024 3.2 (HH)  0.5 - 1.9 mmol/L Final   Comment 03/25/2024 NOTIFIED PHYSICIAN   Final  Admission on 02/10/2024, Discharged on 02/12/2024  Component Date Value Ref Range Status   WBC 02/10/2024 16.8 (H)  4.0 - 10.5 K/uL Final   RBC 02/10/2024 4.46  3.87 - 5.11 MIL/uL Final   Hemoglobin 02/10/2024 12.7  12.0 - 15.0 g/dL Final   HCT 93/84/7974 39.5  36.0 - 46.0 % Final   MCV 02/10/2024 88.6  80.0 - 100.0 fL Final   MCH 02/10/2024 28.5  26.0 - 34.0 pg Final   MCHC 02/10/2024 32.2  30.0 - 36.0 g/dL Final   RDW 93/84/7974 14.8  11.5 - 15.5 % Final   Platelets 02/10/2024 331  150 - 400 K/uL Final   nRBC 02/10/2024 0.0  0.0 - 0.2 % Final   Neutrophils  Relative % 02/10/2024 83  % Final   Neutro Abs 02/10/2024 14.0 (H)  1.7 - 7.7 K/uL Final   Lymphocytes Relative 02/10/2024 9  % Final   Lymphs Abs 02/10/2024 1.6  0.7 - 4.0 K/uL Final   Monocytes Relative 02/10/2024 6  % Final   Monocytes Absolute 02/10/2024 1.0  0.1 - 1.0 K/uL Final   Eosinophils Relative 02/10/2024 1  % Final   Eosinophils Absolute 02/10/2024 0.1  0.0 - 0.5 K/uL Final   Basophils Relative 02/10/2024 0  % Final   Basophils Absolute 02/10/2024 0.1  0.0 - 0.1 K/uL Final   Immature Granulocytes 02/10/2024 1  % Final   Abs Immature Granulocytes 02/10/2024 0.08 (H)  0.00 - 0.07 K/uL Final   Performed at [facility], [address] [place], [place] [address]   Sodium 02/10/2024 136  135 - 145 mmol/L Final   Potassium 02/10/2024 3.9   3.5 - 5.1 mmol/L Final   Chloride 02/10/2024 101  98 - 111 mmol/L Final   CO2 02/10/2024 25  22 - 32 mmol/L Final   Glucose, Bld 02/10/2024 94  70 - 99 mg/dL Final   Glucose reference range applies only to samples taken after fasting for at least 8 hours.   BUN 02/10/2024 7  6 - 20 mg/dL Final   Creatinine, Ser 02/10/2024 0.66  0.44 - 1.00 mg/dL Final   Calcium 93/84/7974 8.7 (L)  8.9 - 10.3 mg/dL Final   Total Protein 93/84/7974 6.8  6.5 - 8.1 g/dL Final   Albumin 93/84/7974 3.2 (L)  3.5 - 5.0 g/dL Final   AST 93/84/7974 215 (H)  15 - 41 U/L Final   ALT 02/10/2024 143 (H)  0 - 44 U/L Final   Alkaline Phosphatase 02/10/2024 82  38 - 126 U/L Final   Total Bilirubin 02/10/2024 1.0  0.0 - 1.2 mg/dL Final   GFR, Estimated 02/10/2024 >60  >60 mL/min Final   Comment: (NOTE) Calculated using the CKD-EPI Creatinine Equation (2021)    Anion gap 02/10/2024 10  5 - 15 Final   Performed at [facility], [address], [place], [place] [address]   Lipase 02/10/2024 25  11 - 51 U/L Final   Performed at [facility], [address], [place], [place] [address]   Specimen Source 02/10/2024 URINE, CLEAN CATCH   Final   Color, Urine 02/10/2024 YELLOW  YELLOW Final   APPearance 02/10/2024 CLOUDY (A)  CLEAR Final   Specific Gravity, Urine 02/10/2024 1.015  1.005 - 1.030 Final   pH 02/10/2024 7.0  5.0 - 8.0 Final   Glucose, UA 02/10/2024 NEGATIVE  NEGATIVE mg/dL Final   Hgb urine dipstick 02/10/2024 NEGATIVE  NEGATIVE Final   Bilirubin Urine 02/10/2024 NEGATIVE  NEGATIVE Final   Ketones, ur 02/10/2024 NEGATIVE  NEGATIVE mg/dL Final   Protein, ur 93/84/7974 30 (A)  NEGATIVE mg/dL Final   Nitrite 93/84/7974 POSITIVE (A)  NEGATIVE Final   Leukocytes,Ua 02/10/2024 MODERATE (A)  NEGATIVE Final   RBC / HPF 02/10/2024 0-5  0 - 5 RBC/hpf Final   WBC, UA 02/10/2024 21-50  0 - 5 WBC/hpf Final   Comment:        Reflex urine culture not performed if WBC <=10, OR if Squamous epithelial cells >5. If  Squamous epithelial cells >5 suggest recollection.    Bacteria, UA 02/10/2024 FEW (A)  NONE SEEN Final   Squamous Epithelial / HPF 02/10/2024 0-5  0 - 5 /HPF Final  Mucus 02/10/2024 PRESENT   Final   Amorphous Crystal 02/10/2024 PRESENT   Final   Performed at [facility], [address] [place], [place] [address]   Preg Test, Ur 02/10/2024 NEGATIVE  NEGATIVE Final   Comment:        THE SENSITIVITY OF THIS METHODOLOGY IS >25 mIU/mL. Performed at [facility], [address], [place], [place] [address]    Specimen Description 02/10/2024 BLOOD RIGHT FOREARM   Final   Special Requests 02/10/2024 BOTTLES DRAWN AEROBIC AND ANAEROBIC Blood Culture adequate volume   Final   Culture 02/10/2024    Final                   Value:NO GROWTH 5 DAYS Performed at [facility], [address], [place], [place] [address]    Report Status 02/10/2024 02/15/2024 FINAL   Final   Specimen Description 02/10/2024 BLOOD LEFT ANTECUBITAL   Final   Special Requests 02/10/2024 BOTTLES DRAWN AEROBIC AND ANAEROBIC Blood Culture results may not be optimal due to an inadequate volume of blood received in culture bottles   Final   Culture 02/10/2024    Final                   Value:NO GROWTH 5 DAYS Performed at [facility], [address] [place], [place] [address]    Report Status 02/10/2024 02/15/2024 FINAL   Final   Neisseria Gonorrhea 02/10/2024 Positive (A)   Final   Chlamydia 02/10/2024 Negative   Final   Comment 02/10/2024 Normal Reference Ranger Chlamydia - Negative   Final   Comment 02/10/2024 Normal Reference Range Neisseria Gonorrhea - Negative   Final   Yeast Wet Prep HPF POC 02/10/2024 NONE SEEN  NONE SEEN Final   Trich, Wet Prep 02/10/2024 NONE SEEN  NONE SEEN Final   Clue Cells Wet Prep HPF POC 02/10/2024 NONE SEEN  NONE SEEN Final   WBC, Wet Prep HPF POC 02/10/2024 >=10 (A)  <10 Final   Sperm 02/10/2024 NONE SEEN   Final   Performed at [facility], [address] [place], [place] [address]   HIV Screen 4th Generation wRfx 02/10/2024 Non Reactive  Non Reactive Final   Performed at [facility], [address], [place], [place] [address]   RPR Ser Ql 02/10/2024 NON REACTIVE  NON REACTIVE Final   Performed at [facility], [address], [place], [place] [address]   Hepatitis B Surface Ag 02/10/2024 NON REACTIVE  NON REACTIVE Final   HCV Ab 02/10/2024 Reactive (A)  NON REACTIVE Final   Comment: (NOTE) The CDC recommends that a Reactive HCV antibody result be followed up  with a HCV Nucleic Acid Amplification test.     Hep A IgM 02/10/2024 NON REACTIVE  NON REACTIVE Final   Hep B C IgM 02/10/2024 NON REACTIVE  NON REACTIVE Final   Performed at [facility], [address] [place], [place] [address]   Specimen Description 02/10/2024 URINE, RANDOM   Final   Special Requests 02/10/2024    Final                   Value:NONE Reflexed from [id number] Performed at [facility], [address], [place], [place] [address]    Culture 02/10/2024 MULTIPLE SPECIES PRESENT, SUGGEST RECOLLECTION (A)   Final   Report Status 02/10/2024 02/11/2024 FINAL   Final  WBC 02/11/2024 17.9 (H)  4.0 - 10.5 K/uL Final   RBC 02/11/2024 4.32  3.87 - 5.11 MIL/uL Final   Hemoglobin 02/11/2024 12.4  12.0 - 15.0 g/dL Final   HCT 93/83/7974 38.1  36.0 - 46.0 % Final   MCV 02/11/2024 88.2  80.0 - 100.0 fL Final   MCH 02/11/2024 28.7  26.0 - 34.0 pg Final   MCHC 02/11/2024 32.5  30.0 - 36.0 g/dL Final   RDW 93/83/7974 14.9  11.5 - 15.5 % Final   Platelets 02/11/2024 328  150 - 400 K/uL Final   nRBC 02/11/2024 0.0  0.0 - 0.2 % Final   Performed at [facility], [address], [place], [place] [address]   Sodium 02/11/2024 134 (L)  135 - 145 mmol/L Final   Potassium 02/11/2024 4.1  3.5 - 5.1 mmol/L Final   Chloride 02/11/2024 103  98 - 111 mmol/L Final   CO2 02/11/2024 23  22 - 32 mmol/L Final   Glucose, Bld 02/11/2024 245 (H)  70 - 99 mg/dL Final    Glucose reference range applies only to samples taken after fasting for at least 8 hours.   BUN 02/11/2024 9  6 - 20 mg/dL Final   Creatinine, Ser 02/11/2024 0.76  0.44 - 1.00 mg/dL Final   Calcium 93/83/7974 7.9 (L)  8.9 - 10.3 mg/dL Final   Total Protein 93/83/7974 5.7 (L)  6.5 - 8.1 g/dL Final   Albumin 93/83/7974 2.3 (L)  3.5 - 5.0 g/dL Final   AST 93/83/7974 109 (H)  15 - 41 U/L Final   ALT 02/11/2024 95 (H)  0 - 44 U/L Final   Alkaline Phosphatase 02/11/2024 72  38 - 126 U/L Final   Total Bilirubin 02/11/2024 0.7  0.0 - 1.2 mg/dL Final   GFR, Estimated 02/11/2024 >60  >60 mL/min Final   Comment: (NOTE) Calculated using the CKD-EPI Creatinine Equation (2021)    Anion gap 02/11/2024 8  5 - 15 Final   Performed at [facility], [address], [place], [place] [address]   HCV RNA Qnt(log copy/mL) 02/11/2024 6.778  log10 IU/mL Corrected   HepC Qn 02/11/2024 6,000,000  IU/mL Final   Test Information 02/11/2024 Comment   Final   Comment: (NOTE) The quantitative range of this assay is 15 IU/mL to 100 million IU/mL.    Hcv Genotype 02/11/2024 Comment   Final   Comment: (NOTE) To be performed on this specimen. Performed At: [facility] [address] [place], [place] [id number] Jennette Shorter MD Ey:[id number]    aPTT 02/11/2024 37 (H)  24 - 36 seconds Final   Comment:        IF BASELINE aPTT IS ELEVATED, SUGGEST PATIENT RISK ASSESSMENT BE USED TO DETERMINE APPROPRIATE ANTICOAGULANT THERAPY. Performed at [facility], [address] [place], [place] [address]    Prothrombin Time 02/11/2024 15.4 (H)  11.4 - 15.2 seconds Final   INR 02/11/2024 1.2  0.8 - 1.2 Final   Comment: (NOTE) INR goal varies based on device and disease states. Performed at [facility], [address], [place], [place] [address]    Fibrinogen  02/11/2024 524 (H)  210 - 475 mg/dL Final   Comment: (NOTE) Fibrinogen  results may be underestimated in patients  receiving thrombolytic therapy. Performed at [facility], [address] [place], [place] [address]    TSH 02/11/2024 0.373  0.350 - 4.500 uIU/mL Final   Comment: Performed by a 3rd  Generation assay with a functional sensitivity of <=0.01 uIU/mL. Performed at [facility], [address], [place], [place] [address]    WBC 02/12/2024 9.0  4.0 - 10.5 K/uL Final   RBC 02/12/2024 4.32  3.87 - 5.11 MIL/uL Final   Hemoglobin 02/12/2024 12.6  12.0 - 15.0 g/dL Final   HCT 93/82/7974 38.4  36.0 - 46.0 % Final   MCV 02/12/2024 88.9  80.0 - 100.0 fL Final   MCH 02/12/2024 29.2  26.0 - 34.0 pg Final   MCHC 02/12/2024 32.8  30.0 - 36.0 g/dL Final   RDW 93/82/7974 14.9  11.5 - 15.5 % Final   Platelets 02/12/2024 340  150 - 400 K/uL Final   nRBC 02/12/2024 0.0  0.0 - 0.2 % Final   Performed at [facility], [address], [place], [place] [address]   Sodium 02/12/2024 138  135 - 145 mmol/L Final   Potassium 02/12/2024 3.9  3.5 - 5.1 mmol/L Final   Chloride 02/12/2024 106  98 - 111 mmol/L Final   CO2 02/12/2024 22  22 - 32 mmol/L Final   Glucose, Bld 02/12/2024 147 (H)  70 - 99 mg/dL Final   Glucose reference range applies only to samples taken after fasting for at least 8 hours.   BUN 02/12/2024 10  6 - 20 mg/dL Final   Creatinine, Ser 02/12/2024 0.66  0.44 - 1.00 mg/dL Final   Calcium 93/82/7974 8.2 (L)  8.9 - 10.3 mg/dL Final   Total Protein 93/82/7974 5.9 (L)  6.5 - 8.1 g/dL Final   Albumin 93/82/7974 2.2 (L)  3.5 - 5.0 g/dL Final   AST 93/82/7974 133 (H)  15 - 41 U/L Final   ALT 02/12/2024 93 (H)  0 - 44 U/L Final   Alkaline Phosphatase 02/12/2024 63  38 - 126 U/L Final   Total Bilirubin 02/12/2024 0.4  0.0 - 1.2 mg/dL Final   GFR, Estimated 02/12/2024 >60  >60 mL/min Final   Comment: (NOTE) Calculated using the CKD-EPI Creatinine Equation (2021)    Anion gap 02/12/2024 10  5 - 15 Final   Performed at [facility], [address], [place], [place] [address]    Hgb A1c MFr Bld 02/12/2024 5.4  4.8 - 5.6 % Final   Comment: (NOTE) Diagnosis of Diabetes The following HbA1c ranges recommended by the American Diabetes Association (ADA) may be used as an aid in the diagnosis of diabetes mellitus.  Hemoglobin             Suggested A1C NGSP%              Diagnosis  <5.7                   Non Diabetic  5.7-6.4                Pre-Diabetic  >6.4                   Diabetic  <7.0                   Glycemic control for                       adults with diabetes.     Mean Plasma Glucose 02/12/2024 108.28  mg/dL Final   Performed at [facility], [address], [place], [place] [address]   Hepatitis C Genotype 02/11/2024 1a   Final  Comment: (NOTE) This test was developed and its performance characteristics determined by [facility]. It has not been cleared or approved by the Food and Drug Administration. Performed At: [facility] [address] [place], [place] [id number] Jennette Shorter MD Ey:[id number]     Blood Alcohol level:  Lab Results  Component Value Date   [facility] <15 07/08/2024    Metabolic Disorder Labs: Lab Results  Component Value Date   HGBA1C 5.5 07/08/2024   MPG 111.15 07/08/2024   MPG 108.28 02/12/2024   No results found for: PROLACTIN Lab Results  Component Value Date   CHOL 174 07/08/2024   TRIG 102 07/08/2024   HDL 42 07/08/2024   CHOLHDL 4.1 07/08/2024   VLDL 20 07/08/2024   LDLCALC 112 (H) 07/08/2024   LDLCALC 73 09/22/2020    Therapeutic Lab Levels: No results found for: LITHIUM No results found for: VALPROATE No results found for: CBMZ  Physical Findings   GAD-7    Flowsheet Row Procedure visit from 11/01/2020 in [facility] for [facility] at [facility] Office Visit from 09/22/2020 in Center for [employer] at [facility] Clinical Support from 06/21/2020 in [facility] for [employer] at [facility] Clinical  Support from 04/05/2020 in [facility] for [employer] [facility] at [facility] Clinical Support from 01/19/2020 in [facility] for [employer] [facility] at [employer] [facility]  Total GAD-7 Score 7 2 10 8  0   PHQ2-9    Flowsheet Row ED from 07/10/2024 in [facility] ED from 07/08/2024 in [facility] Procedure visit from 11/01/2020 in [facility] for [employer] [facility] at [facility] Office Visit from 09/22/2020 in [facility] for [employer] [facility] at [facility] Clinical Support from 06/21/2020 in [facility] for [employer] [facility] at [facility]  PHQ-2 Total Score 6 4 0 0 2  PHQ-9 Total Score 21 12 0 2 10   Flowsheet Row ED from 07/10/2024 in [facility] ED from 07[facility]08/2024 in [facility] ED from 05/17/2024 in [facility] at [facility]  C-SSRS RISK CATEGORY No Risk No Risk No Risk     Musculoskeletal  Strength & Muscle Tone: within normal limits Gait & Station: normal Patient leans: N/A  Psychiatric Specialty Exam  Presentation  General Appearance:  Appropriate for Environment  Eye Contact: Good  Speech: Clear and Coherent  Speech Volume: Normal  Handedness: Right   Mood and Affect  Mood: Anxious  Affect: Appropriate; Congruent   Thought Process  Thought Processes: Coherent  Descriptions of Associations:Intact  Orientation:Full (Time, Place and Person)  Thought Content:Logical  Diagnosis of Schizophrenia or Schizoaffective disorder in past: No    Hallucinations:Hallucinations: None  Ideas of Reference:None  Suicidal Thoughts:Suicidal Thoughts: No (I want to live.)  Homicidal Thoughts:Homicidal Thoughts: No   Sensorium  Memory: Immediate Fair; Recent Fair  Judgment: Good  Insight: Fair   Art Therapist  Concentration: Fair  Attention  Span: Fair  Recall: [employer] of Knowledge: Fair  Language: Fair   Psychomotor Activity  Psychomotor Activity: Psychomotor Activity: Normal   Assets  Assets: Communication Skills; Desire for Improvement   Sleep  Sleep: Sleep: Fair  Estimated Sleeping Duration (Last 24 Hours): 10.75-11.75 hours (Due to Daylight Saving Time, the durations displayed may not accurately represent documentation during the time change interval)  No data recorded  Physical Exam  Physical Exam ROS Blood pressure 120/68, pulse 88, temperature 98 F (36.7 C), temperature source Oral, resp. rate 16, SpO2 98%. There is no height or weight on file to calculate BMI.  Treatment Plan Summary: Daily contact with patient to assess and evaluate symptoms and progress in treatment and Medication management. Continue multimodal treatment in [facility]. Increase quetiapine to 100mg  qAM, 200mg  at bedtime to target mood lability, anxiety, insomnia. Increase buspirone to 15mg  BID for anxiety.  Psychoeducation on proper use of nicotine  patch and gum. Continue nonspecific nitrofurantoin treatment Day 3/7. Anticipate discharge to [facility] Relapse Prevention program on 11/20 followed by admission to [facility] Treatment on 12/1.    KANDI JAYSON HAHN, MD 07/14/2024 3:29 PM

## 2024-07-14 NOTE — Care Management (Addendum)
 [facility] Care Management...  Writer coordinated inpatient treatment ...  Patient scheduled to discharge 11/20/205  Patient has been accepted to [facility] appointment scheduled for 07/28/2024  Writer coordinated appointment with Harlene, Peer Support Specialist  Per Harlene, patient will be housed at [facility] in [place] (Relapse Prevention Program) until her appointment date 07/28/24   Harlene will pick up and transport patient  Harlene will continue to work with patient to provide support and community resources   7 day samples and scripts

## 2024-07-14 NOTE — ED Notes (Signed)
 Pt ate dinner. Pt generally pleasant. Pt reported a bump/ abscess growing under her L armpit, provider notified.

## 2024-07-15 DIAGNOSIS — F1924 Other psychoactive substance dependence with psychoactive substance-induced mood disorder: Secondary | ICD-10-CM | POA: Diagnosis not present

## 2024-07-15 DIAGNOSIS — F11220 Opioid dependence with intoxication, uncomplicated: Secondary | ICD-10-CM | POA: Diagnosis not present

## 2024-07-15 DIAGNOSIS — F411 Generalized anxiety disorder: Secondary | ICD-10-CM | POA: Diagnosis not present

## 2024-07-15 DIAGNOSIS — F1420 Cocaine dependence, uncomplicated: Secondary | ICD-10-CM | POA: Diagnosis not present

## 2024-07-15 LAB — MISC LABCORP TEST (SEND OUT): Labcorp test code: 83935

## 2024-07-15 MED ORDER — ALUM & MAG HYDROXIDE-SIMETH 200-200-20 MG/5ML PO SUSP
30.0000 mL | ORAL | Status: DC | PRN
  Administered 2024-07-15 – 2024-07-21 (×2): 30 mL via ORAL
  Filled 2024-07-15 (×2): qty 30

## 2024-07-15 MED ORDER — QUETIAPINE FUMARATE 50 MG PO TABS
250.0000 mg | ORAL_TABLET | Freq: Every day | ORAL | Status: DC
  Administered 2024-07-15: 250 mg via ORAL
  Filled 2024-07-15: qty 1

## 2024-07-15 NOTE — ED Notes (Signed)
 Patient alert & oriented x4. Denies intent to harm self or others when asked. Denies A/VH. Patient reports generalized aches, denies need for medication at this time. Scheduled medications administered with no complications. No acute distress noted. Support and encouragement provided. Patient observed in milieu. No inappropriate behaviors observed or reported. Routine safety checks conducted per facility protocol. Encouraged patient to notify staff if any thoughts of harm towards self or others arise. Patient verbalizes understanding and agreement.

## 2024-07-15 NOTE — Group Note (Signed)
 Group Topic: Change and Accountability  Group Date: 07/15/2024 Start Time: 1315 End Time: 1400 Facilitators: Alyse Leilani LABOR, NT  Department: [facility]  Number of Participants: 9  Group Focus: acceptance, forgiveness, and relapse prevention Treatment Modality:  Psychodynamic Psychotherapy Interventions utilized were group exercise and patient education Purpose: enhance coping skills, express feelings, regain self-worth, and reinforce self-care  Name: Morgan Donaldson Date of Birth: 12-Nov-1980  MR: [id number]    Level of Participation: active Quality of Participation: cooperative and engaged Interactions with others: gave feedback Mood/Affect: bright and positive Triggers (if applicable): none Cognition: coherent/clear and goal directed Progress: Gaining insight Response: Listened Plan: patient will be encouraged to keep coming to group  Patients Problems:  Patient Active Problem List   Diagnosis Date Noted   Opioid dependence with uncomplicated intoxication (HCC) 07/14/2024   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   Elevated liver function tests 07/14/2024   Opioid use with withdrawal (HCC) 07/14/2024   Polysubstance abuse (HCC) 07/10/2024   Pyosalpingitis 02/12/2024   Hepatitis C antibody detected 02/11/2024   PID (acute pelvic inflammatory disease) 02/10/2024   Gonorrhea 12/10/2023   Group A streptococcal infection 12/09/2023   IV drug abuse (HCC) 12/09/2023   Acute cystitis 12/09/2023   GAD (generalized anxiety disorder) 06/21/2020   Attention deficit hyperactivity disorder (ADHD), 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

## 2024-07-15 NOTE — Care Management (Signed)
 [facility] Care Management...  Patient has been accepted to [facility] 07/28/24 appointment to complete two part intake process  Writer coordinated a meeting with patient and Harlene Peer Support specialist  Patient expected to discharge on 07/17/2024  [facility] for temporary housing to bridge the gap til her appointment for residential

## 2024-07-15 NOTE — Group Note (Signed)
 Group Topic: Social Support  Group Date: 07/15/2024 Start Time: 1200 End Time: 1230 Facilitators: Finlee Concepcion, Zane HERO, RN  Department: [facility]  Number of Participants: 1  Group Focus: check in Treatment Modality:  Individual Therapy Interventions utilized were support Purpose: express feelings  Name: Morgan Donaldson Date of Birth: 11/19/1980  MR: [id number]    Level of Participation: moderate Quality of Participation: attentive and cooperative Interactions with others: gave feedback Mood/Affect: appropriate Triggers (if applicable): None identified at this time Cognition: coherent/clear Progress: Gaining insight Response: Patient voices no concerns regarding unit, patient has a few medication concerns which has been brought forward to the provider. Patient voices understanding of her medications and her treatment plan at this time. Voices dissatisfaction regarding length of stay (feels it has been too long) but states she does not want to leave at this time.  Plan: patient will be encouraged to continue to attend groups/programming on the unit  Patients Problems:  Patient Active Problem List   Diagnosis Date Noted   Opioid dependence with uncomplicated intoxication (HCC) 07/14/2024   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   Elevated liver function tests 07/14/2024   Opioid use with withdrawal (HCC) 07/14/2024   Polysubstance abuse (HCC) 07/10/2024   Pyosalpingitis 02/12/2024   Hepatitis C antibody detected 02/11/2024   PID (acute pelvic inflammatory disease) 02/10/2024   Gonorrhea 12/10/2023   Group A streptococcal infection 12/09/2023   IV drug abuse (HCC) 12/09/2023   Acute cystitis 12/09/2023   GAD (generalized anxiety disorder) 06/21/2020   Attention deficit hyperactivity disorder (ADHD), combined type 06/21/2020   Breakthrough bleeding on depo provera  05/12/2020   Pap smear abnormality of cervix/human papillomavirus (HPV) positive  08/17/2019   Hemorrhagic cyst of left ovary 08/11/2019   Tobacco abuse 05/19/2019   History of drug abuse in remission (HCC) 05/19/2019

## 2024-07-15 NOTE — ED Notes (Signed)
 Pt is sleeping, no acute distress noted. Q15 safety checks in place.

## 2024-07-15 NOTE — ED Provider Notes (Addendum)
 Behavioral Health Progress Note  Date and Time: 07/15/2024 5:14 PM Name: Morgan Donaldson MRN:  [id number]  Provider Admission HPI: Morgan Donaldson is  a 43 year old female who presented  to [facility] with a history of Opioid Use Disorder, severe and Stimulant Use Disorder, cocaine type, severe along with untreated Bipolar Disorder. Pt was transferred to Facility Based Crisis unit for detox from Fentanyl  and crack cocaine. UDS positive for cocaine, morphine  and THC. BAL upon admission is negative.   Today's patient assessment: During encounter today, patient was persistent about wanting to be on lithium, unable to tell writer why lithium was previously discontinued, but states that she previously took it 9 months ago, and it was the last time that she was on the medication.  Reports being stabilized on this medication in the past.  Writer educated on the fact that given the lack of a stable living environment at this time, which subsequently entails the lack of a guaranteed follow up regarding this medication at discharge, it is best for her to pursue getting this medication outpatient.  Patient educated to speak with her outpatient provider regarding restarting this medication, so as to ensure continuity of care regarding the medication.  Patient also asking for Prozac , but due to her history of bipolar disorder as per her reports, we will hold off starting an SSRI medication at this time, and this can also be pursued outpatient where her outpatient provider can monitor for mania or hypomania while medication is being initiated.    Patient denies suicidal ideations, denies homicidal ideations, denied AVH, denies paranoia and there are no overt signs of psychosis.  She is continuing to work with outpatient provider in an effort to secure an outpatient follow-up at discharge.  Tentative discharge date is 11/20.  Patient reports that she has a peer support person who is assisting her with securing  housing.  Labs reviewed: No new orders. Medication adjustments for today: Agreeable to increasing Seroquel 200 mg nightly to 250 to help with mood stabilization.  Keeping all other medications the same.  Please see the [facility] for details of medications.  Diagnosis:  Final diagnoses:  Polysubstance abuse (HCC)  Opioid dependence with uncomplicated intoxication (HCC)  Drug-induced mood disorder (HCC)  Elevated liver function tests  Opioid use with withdrawal (HCC)  Hyperthyroidism  Generalized anxiety disorder   Total Time spent with patient: I personally spent 45 minutes minutes on the unit in direct patient care. The direct patient care time included face-to-face time with the patient, reviewing the patient's chart, communicating with other professionals, and coordinating care. Greater than 50% of this time was spent in counseling or coordinating care with the patient regarding goals of hospitalization, psycho-education, and discharge planning needs.   Reported substance use on admission: Patient reported she has been using 1/2 gram of fentanyl  IV for the past year.  She has also been smoking approximately 1/2 gram of crack cocaine daily for the past year.  Patient stated she is done with using, stating she had this realization when she woke up under a bridge this morning.  Patient has been homeless for the past year.  She reported  she has a boyfriend, and things are going well outside of patient's substance use issues.  Patient stated her boyfriend is supportive of her seeking treatment and will allow her to stay with him once she is clean and has completed treatment.  Patient reported  she is also estranged from her sister and other family members  due to her ongoing issues with substance use.  She is hopeful that after treatment she can reconnect with family.    Past Psychiatric History: Bipolar d/o, PTSD, and polysubstance abuse.  Past Medical History:      Patient Active Problem List     Diagnosis Date Noted   Polysubstance abuse (HCC) 07/10/2024   Pyosalpingitis 02/12/2024   Hepatitis C antibody detected 02/11/2024   PID (acute pelvic inflammatory disease) 02/10/2024   Gonorrhea 12/10/2023   Group A streptococcal infection 12/09/2023   IV drug abuse (HCC) 12/09/2023   Acute cystitis 12/09/2023   GAD (generalized anxiety disorder) 06/21/2020   Attention deficit hyperactivity disorder (ADHD), 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        Past Medical History:  Diagnosis Date   Bronchitis     Hydrosalpinx      bilateral   Palpitations     Physiological ovarian cysts          Family History:       Family History  Problem Relation Age of Onset   Lung cancer Mother          died at age 44   CAD Mother          MI at age 64   Hypertension Mother     Schizophrenia Father     Hypertension Sister     Hyperlipidemia Sister     COPD Sister     Healthy Brother     Healthy Sister          Social History: Currently homeless, unemployed and in a relationship. She reports plan to live with boyfriend after finishing residential treatment. She endorses fentanyl , cocaine and THC use.   Sleep: Fair  Appetite:  Fair  Current Medications:  Current Facility-Administered Medications  Medication Dose Route Frequency Provider Last Rate Last Admin   acetaminophen  (TYLENOL ) tablet 650 mg  650 mg Oral Q6H PRN Randall Starlyn HERO, NP   650 mg at 07/13/24 0836   alum & mag hydroxide-simeth (MAALOX/MYLANTA) 200-200-20 MG/5ML suspension 30 mL  30 mL Oral Q4H PRN Randall Starlyn HERO, NP   30 mL at 07/15/24 1259   busPIRone (BUSPAR) tablet 15 mg  15 mg Oral BID Bethea, Terrence C, MD   15 mg at 07/15/24 1522   haloperidol  (HALDOL )  tablet 5 mg  5 mg Oral TID PRN Randall Starlyn HERO, NP   5 mg at 07/15/24 1358   And   diphenhydrAMINE  (BENADRYL ) capsule 50 mg  50 mg Oral TID PRN Randall Starlyn HERO, NP   50 mg at 07/15/24 1358   haloperidol  lactate (HALDOL ) injection 5 mg  5 mg Intramuscular TID PRN Randall Starlyn HERO, NP       And   diphenhydrAMINE  (BENADRYL ) injection 50 mg  50 mg Intramuscular TID PRN Randall Starlyn HERO, NP       And   LORazepam  (ATIVAN ) injection 2 mg  2 mg Intramuscular TID PRN Randall Starlyn HERO, NP       haloperidol  lactate (HALDOL ) injection 10 mg  10 mg Intramuscular TID PRN Randall Starlyn HERO, NP       And   diphenhydrAMINE  (BENADRYL ) injection 50 mg  50 mg Intramuscular TID PRN Randall Starlyn HERO, NP  And   LORazepam  (ATIVAN ) injection 2 mg  2 mg Intramuscular TID PRN Randall Starlyn HERO, NP       feeding supplement (ENSURE PLUS HIGH PROTEIN) liquid 237 mL  237 mL Oral TID BM Gottfried, Rhoda J, MD   237 mL at 07/15/24 1301   magnesium  hydroxide (MILK OF MAGNESIA) suspension 30 mL  30 mL Oral Daily PRN Randall Starlyn HERO, NP       nicotine  (NICODERM CQ  - dosed in mg/24 hours) patch 21 mg  21 mg Transdermal Daily Byungura, Veronique M, NP   21 mg at 07/15/24 9073   nicotine  polacrilex (NICORETTE) gum 2 mg  2 mg Oral PRN Gottfried, Rhoda J, MD   2 mg at 07/14/24 1842   nitrofurantoin (macrocrystal-monohydrate) (MACROBID) capsule 100 mg  100 mg Oral Q12H Lewis, Tanika N, NP   100 mg at 07/15/24 0924   QUEtiapine (SEROQUEL) tablet 100 mg  100 mg Oral Landrum Hahn, Terrence C, MD   100 mg at 07/15/24 9075   QUEtiapine (SEROQUEL) tablet 250 mg  250 mg Oral QHS Constance Hackenberg, Donia, NP       traZODone  (DESYREL ) tablet 100 mg  100 mg Oral QHS PRN Byungura, Veronique M, NP   100 mg at 07/14/24 2107   No current outpatient medications on file.    Labs  Lab Results:  Admission on 07/10/2024  Component Date Value Ref Range Status   T3, Free 07/11/2024 2.2  2.0 - 4.4 pg/mL Final    Comment: (NOTE) Performed At: [facility] [address] [place], [place] [id number] Jennette Shorter MD Ey:[id number]    Free T4 07/11/2024 0.71  0.61 - 1.12 ng/dL Final   Comment: (NOTE) Biotin ingestion may interfere with free T4 tests. If the results are inconsistent with the TSH level, previous test results, or the clinical presentation, then consider biotin interference. If needed, order repeat testing after stopping biotin. Performed at [facility], [address], [place], [place] [address]    Neisseria Gonorrhea 07/12/2024 Negative   Final   Chlamydia 07/12/2024 Negative   Final   Comment 07/12/2024 Normal Reference Ranger Chlamydia - Negative   Final   Comment 07/12/2024 Normal Reference Range Neisseria Gonorrhea - Negative   Final   Total Protein 07/12/2024 7.1  6.5 - 8.1 g/dL Final   Albumin 88/84/7974 3.3 (L)  3.5 - 5.0 g/dL Final   AST 88/84/7974 67 (H)  15 - 41 U/L Final   ALT 07/12/2024 95 (H)  0 - 44 U/L Final   Alkaline Phosphatase 07/12/2024 110  38 - 126 U/L Final   Total Bilirubin 07/12/2024 0.6  0.0 - 1.2 mg/dL Final   Bilirubin, Direct 07/12/2024 <0.1  0.0 - 0.2 mg/dL Final   Indirect Bilirubin 07/12/2024 NOT CALCULATED  0.3 - 0.9 mg/dL Final   Performed at [facility], [address], [place], [place] [address]   RPR Ser Ql 07/12/2024 NON REACTIVE  NON REACTIVE Final   Performed at [facility], [address], [place], [place] [address]   [facility] test code 07/12/2024 [id number]   Final   [facility] test name 07/12/2024 [id number]   Final   Source ([facility]) 07/12/2024 SERUM   Final   Performed at [facility], [address], [place], [place] [address]   Misc [facility] result 07/12/2024 COMMENT   Final   Comment: (NOTE) Test Ordered: [id number] HIV Ab/p24 Ag with Reflex HIV Ab/p24 Ag Screen  Note:                     BN     Non Reactive                                                  Reference Range: Non Reactive                           HIV-1/HIV-2 antibodies and HIV-1 p24 antigen were NOT detected. There is no laboratory evidence of HIV infection. HIV Negative Performed At: [facility] [address] [place], [place] [id number] Jennette Shorter MD Ey:[id number]   Admission on 07/08/2024, Discharged on 07/10/2024  Component Date Value Ref Range Status   WBC 07/08/2024 10.6 (H)  4.0 - 10.5 K/uL Final   RBC 07/08/2024 5.65 (H)  3.87 - 5.11 MIL/uL Final   Hemoglobin 07/08/2024 16.5 (H)  12.0 - 15.0 g/dL Final   HCT 88/88/7974 49.4 (H)  36.0 - 46.0 % Final   MCV 07/08/2024 87.4  80.0 - 100.0 fL Final   MCH 07/08/2024 29.2  26.0 - 34.0 pg Final   MCHC 07/08/2024 33.4  30.0 - 36.0 g/dL Final   RDW 88/88/7974 13.7  11.5 - 15.5 % Final   Platelets 07/08/2024 422 (H)  150 - 400 K/uL Final   nRBC 07/08/2024 0.0  0.0 - 0.2 % Final   Neutrophils Relative % 07/08/2024 75  % Final   Neutro Abs 07/08/2024 7.9 (H)  1.7 - 7.7 K/uL Final   Lymphocytes Relative 07/08/2024 17  % Final   Lymphs Abs 07/08/2024 1.8  0.7 - 4.0 K/uL Final   Monocytes Relative 07/08/2024 7  % Final   Monocytes Absolute 07/08/2024 0.7  0.1 - 1.0 K/uL Final   Eosinophils Relative 07/08/2024 0  % Final   Eosinophils Absolute 07/08/2024 0.0  0.0 - 0.5 K/uL Final   Basophils Relative 07/08/2024 1  % Final   Basophils Absolute 07/08/2024 0.1  0.0 - 0.1 K/uL Final   Immature Granulocytes 07/08/2024 0  % Final   Abs Immature Granulocytes 07/08/2024 0.04  0.00 - 0.07 K/uL Final   Performed at [facility], [address], [place], [place] [address]   Sodium 07/08/2024 140  135 - 145 mmol/L Final   Potassium 07/08/2024 4.1  3.5 - 5.1 mmol/L Final   Chloride 07/08/2024 100  98 - 111 mmol/L Final   CO2 07/08/2024 27  22 - 32 mmol/L Final   Glucose, Bld 07/08/2024 113 (H)  70 - 99 mg/dL Final   Glucose reference range applies only to samples taken after fasting for at least 8 hours.   BUN 07/08/2024 10  6 - 20 mg/dL Final   Creatinine, Ser  07/08/2024 0.74  0.44 - 1.00 mg/dL Final   Calcium 88/88/7974 9.9  8.9 - 10.3 mg/dL Final   Total Protein 88/88/7974 7.9  6.5 - 8.1 g/dL Final   Albumin 88/88/7974 3.5  3.5 - 5.0 g/dL Final   AST 88/88/7974 109 (H)  15 - 41 U/L Final   ALT 07/08/2024 132 (H)  0 - 44 U/L Final   Alkaline Phosphatase 07/08/2024 74  38 - 126 U/L Final   Total Bilirubin 07/08/2024 0.3  0.0 - 1.2 mg/dL Final   GFR, Estimated 07/08/2024 >  60  >60 mL/min Final   Comment: (NOTE) Calculated using the CKD-EPI Creatinine Equation (2021)    Anion gap 07/08/2024 13  5 - 15 Final   Performed at [facility], [address], [place], [place] [address]   Hgb A1c MFr Bld 07/08/2024 5.5  4.8 - 5.6 % Final   Comment: (NOTE) Diagnosis of Diabetes The following HbA1c ranges recommended by the American Diabetes Association (ADA) may be used as an aid in the diagnosis of diabetes mellitus.  Hemoglobin             Suggested A1C NGSP%              Diagnosis  <5.7                   Non Diabetic  5.7-6.4                Pre-Diabetic  >6.4                   Diabetic  <7.0                   Glycemic control for                       adults with diabetes.     Mean Plasma Glucose 07/08/2024 111.15  mg/dL Final   Performed at [facility], [address], [place], [place] [address]   Magnesium  07/08/2024 2.1  1.7 - 2.4 mg/dL Final   Performed at [facility], [address] [place], [place] [address]   Alcohol, Ethyl (B) 07/08/2024 <15  <15 mg/dL Final   Comment: (NOTE) For medical purposes only. Performed at [facility], [address], [place], [place] [address]    Cholesterol 07/08/2024 174  0 - 200 mg/dL Final   Triglycerides 88/88/7974 102  <150 mg/dL Final   HDL 88/88/7974 42  >40 mg/dL Final   Total CHOL/HDL Ratio 07/08/2024 4.1  RATIO Final   VLDL 07/08/2024 20  0 - 40 mg/dL Final   LDL Cholesterol 07/08/2024 112 (H)  0 - 99 mg/dL Final   Comment:        Total Cholesterol/HDL:CHD  Risk Coronary Heart Disease Risk Table                     Men   Women  1/2 Average Risk   3.4   3.3  Average Risk       5.0   4.4  2 X Average Risk   9.6   7.1  3 X Average Risk  23.4   11.0        Use the calculated Patient Ratio above and the CHD Risk Table to determine the patient's CHD Risk.        ATP III CLASSIFICATION (LDL):  <100     mg/dL   Optimal  899-870  mg/dL   Near or Above                    Optimal  130-159  mg/dL   Borderline  839-810  mg/dL   High  >809     mg/dL   Very High Performed at [facility], [address], [place], [place] [address]    Color, Urine 07/09/2024 YELLOW  YELLOW Final   APPearance 07/09/2024 TURBID (A)  CLEAR Final   Specific Gravity, Urine 07/09/2024  1.017  1.005 - 1.030 Final   pH 07/09/2024 7.0  5.0 - 8.0 Final   Glucose, UA 07/09/2024 NEGATIVE  NEGATIVE mg/dL Final   Hgb urine dipstick 07/09/2024 NEGATIVE  NEGATIVE Final   Bilirubin Urine 07/09/2024 NEGATIVE  NEGATIVE Final   Ketones, ur 07/09/2024 NEGATIVE  NEGATIVE mg/dL Final   Protein, ur 88/87/7974 NEGATIVE  NEGATIVE mg/dL Final   Nitrite 88/87/7974 NEGATIVE  NEGATIVE Final   Leukocytes,Ua 07/09/2024 MODERATE (A)  NEGATIVE Final   RBC / HPF 07/09/2024 0-5  0 - 5 RBC/hpf Final   WBC, UA 07/09/2024 21-50  0 - 5 WBC/hpf Final   Bacteria, UA 07/09/2024 MANY (A)  NONE SEEN Final   Squamous Epithelial / HPF 07/09/2024 0-5  0 - 5 /HPF Final   WBC Clumps 07/09/2024 PRESENT   Final   Performed at [facility], [address], [place], [place] [address]   Preg Test, Ur 07/09/2024 Negative  Negative Final   POC Amphetamine  UR 07/09/2024 None Detected  NONE DETECTED (Cut Off Level 1000 ng/mL) Final   POC Secobarbital (BAR) 07/09/2024 None Detected  NONE DETECTED (Cut Off Level 300 ng/mL) Final   POC Buprenorphine (BUP) 07/09/2024 None Detected  NONE DETECTED (Cut Off Level 10 ng/mL) Final   POC Oxazepam (BZO) 07/09/2024 None Detected  NONE DETECTED (Cut Off Level 300 ng/mL)  Final   POC Cocaine UR 07/09/2024 Positive (A)  NONE DETECTED (Cut Off Level 300 ng/mL) Final   POC Methamphetamine UR 07/09/2024 None Detected  NONE DETECTED (Cut Off Level 1000 ng/mL) Final   POC Morphine  07/09/2024 Positive (A)  NONE DETECTED (Cut Off Level 300 ng/mL) Final   POC Methadone UR 07/09/2024 None Detected  NONE DETECTED (Cut Off Level 300 ng/mL) Final   POC Oxycodone  UR 07/09/2024 None Detected  NONE DETECTED (Cut Off Level 100 ng/mL) Final   POC Marijuana UR 07/09/2024 Positive (A)  NONE DETECTED (Cut Off Level 50 ng/mL) Final   TSH 07/08/2024 0.112 (L)  0.350 - 4.500 uIU/mL Final   Comment: Performed by a 3rd Generation assay with a functional sensitivity of <=0.01 uIU/mL. Performed at [facility], [address], [place], [place] [address]    Preg Test, Ur 07/09/2024 NEGATIVE  NEGATIVE Final   Comment:        THE SENSITIVITY OF THIS METHODOLOGY IS >20 mIU/mL.   Admission on 05/17/2024, Discharged on 05/18/2024  Component Date Value Ref Range Status   WBC 05/17/2024 9.0  4.0 - 10.5 K/uL Final   RBC 05/17/2024 4.35  3.87 - 5.11 MIL/uL Final   Hemoglobin 05/17/2024 12.8  12.0 - 15.0 g/dL Final   HCT 90/79/7974 39.7  36.0 - 46.0 % Final   MCV 05/17/2024 91.3  80.0 - 100.0 fL Final   MCH 05/17/2024 29.4  26.0 - 34.0 pg Final   MCHC 05/17/2024 32.2  30.0 - 36.0 g/dL Final   RDW 90/79/7974 14.1  11.5 - 15.5 % Final   Platelets 05/17/2024 286  150 - 400 K/uL Final   nRBC 05/17/2024 0.0  0.0 - 0.2 % Final   Neutrophils Relative % 05/17/2024 65  % Final   Neutro Abs 05/17/2024 5.8  1.7 - 7.7 K/uL Final   Lymphocytes Relative 05/17/2024 23  % Final   Lymphs Abs 05/17/2024 2.1  0.7 - 4.0 K/uL Final   Monocytes Relative 05/17/2024 8  % Final   Monocytes Absolute 05/17/2024 0.8  0.1 - 1.0 K/uL Final   Eosinophils Relative 05/17/2024  4  % Final   Eosinophils Absolute 05/17/2024 0.3  0.0 - 0.5 K/uL Final   Basophils Relative 05/17/2024 0  % Final   Basophils Absolute  05/17/2024 0.0  0.0 - 0.1 K/uL Final   Immature Granulocytes 05/17/2024 0  % Final   Abs Immature Granulocytes 05/17/2024 0.04  0.00 - 0.07 K/uL Final   Performed at [facility], [address] [place], [place] [address]   Preg, Serum 05/17/2024 NEGATIVE  NEGATIVE Final   Comment:        THE SENSITIVITY OF THIS METHODOLOGY IS >10 mIU/mL. Performed at [facility], [address], [place], [place] [address]    Sodium 05/17/2024 141  135 - 145 mmol/L Final   Potassium 05/17/2024 3.5  3.5 - 5.1 mmol/L Final   Chloride 05/17/2024 104  98 - 111 mmol/L Final   CO2 05/17/2024 26  22 - 32 mmol/L Final   Glucose, Bld 05/17/2024 118 (H)  70 - 99 mg/dL Final   Glucose reference range applies only to samples taken after fasting for at least 8 hours.   BUN 05/17/2024 11  6 - 20 mg/dL Final   Creatinine, Ser 05/17/2024 0.78  0.44 - 1.00 mg/dL Final   Calcium 90/79/7974 8.8 (L)  8.9 - 10.3 mg/dL Final   Total Protein 90/79/7974 7.0  6.5 - 8.1 g/dL Final   Albumin 90/79/7974 3.0 (L)  3.5 - 5.0 g/dL Final   AST 90/79/7974 115 (H)  15 - 41 U/L Final   ALT 05/17/2024 144 (H)  0 - 44 U/L Final   Alkaline Phosphatase 05/17/2024 71  38 - 126 U/L Final   Total Bilirubin 05/17/2024 0.5  0.0 - 1.2 mg/dL Final   GFR, Estimated 05/17/2024 >60  >60 mL/min Final   Comment: (NOTE) Calculated using the CKD-EPI Creatinine Equation (2021)    Anion gap 05/17/2024 11  5 - 15 Final   Performed at [facility], [address], [place], [place] [address]   Sodium 05/17/2024 142  135 - 145 mmol/L Final   Potassium 05/17/2024 3.5  3.5 - 5.1 mmol/L Final   Chloride 05/17/2024 103  98 - 111 mmol/L Final   BUN 05/17/2024 12  6 - 20 mg/dL Final   Creatinine, Ser 05/17/2024 0.70  0.44 - 1.00 mg/dL Final   Glucose, Bld 90/79/7974 120 (H)  70 - 99 mg/dL Final   Glucose reference range applies only to samples taken after fasting for at least 8 hours.   Calcium, Ion 05/17/2024 1.13 (L)  1.15 - 1.40 mmol/L  Final   TCO2 05/17/2024 28  22 - 32 mmol/L Final   Hemoglobin 05/17/2024 11.6 (L)  12.0 - 15.0 g/dL Final   HCT 90/79/7974 34.0 (L)  36.0 - 46.0 % Final  Admission on 03/25/2024, Discharged on 03/25/2024  Component Date Value Ref Range Status   Sodium 03/25/2024 140  135 - 145 mmol/L Final   Potassium 03/25/2024 3.2 (L)  3.5 - 5.1 mmol/L Final   Chloride 03/25/2024 105  98 - 111 mmol/L Final   BUN 03/25/2024 9  6 - 20 mg/dL Final   Creatinine, Ser 03/25/2024 0.70  0.44 - 1.00 mg/dL Final   Glucose, Bld 92/70/7974 132 (H)  70 - 99 mg/dL Final   Glucose reference range applies only to samples taken after fasting for at least 8 hours.   Calcium, Ion 03/25/2024 0.99 (L)  1.15 - 1.40 mmol/L Final   TCO2 03/25/2024 24  22 - 32 mmol/L Final   Hemoglobin 03/25/2024 12.9  12.0 - 15.0 g/dL Final   HCT 92/70/7974 38.0  36.0 - 46.0 % Final   Lactic Acid, Venous 03/25/2024 3.2 (HH)  0.5 - 1.9 mmol/L Final   Comment 03/25/2024 NOTIFIED PHYSICIAN   Final  Admission on 02/10/2024, Discharged on 02/12/2024  Component Date Value Ref Range Status   WBC 02/10/2024 16.8 (H)  4.0 - 10.5 K/uL Final   RBC 02/10/2024 4.46  3.87 - 5.11 MIL/uL Final   Hemoglobin 02/10/2024 12.7  12.0 - 15.0 g/dL Final   HCT 93/84/7974 39.5  36.0 - 46.0 % Final   MCV 02/10/2024 88.6  80.0 - 100.0 fL Final   MCH 02/10/2024 28.5  26.0 - 34.0 pg Final   MCHC 02/10/2024 32.2  30.0 - 36.0 g/dL Final   RDW 93/84/7974 14.8  11.5 - 15.5 % Final   Platelets 02/10/2024 331  150 - 400 K/uL Final   nRBC 02/10/2024 0.0  0.0 - 0.2 % Final   Neutrophils Relative % 02/10/2024 83  % Final   Neutro Abs 02/10/2024 14.0 (H)  1.7 - 7.7 K/uL Final   Lymphocytes Relative 02/10/2024 9  % Final   Lymphs Abs 02/10/2024 1.6  0.7 - 4.0 K/uL Final   Monocytes Relative 02/10/2024 6  % Final   Monocytes Absolute 02/10/2024 1.0  0.1 - 1.0 K/uL Final   Eosinophils Relative 02/10/2024 1  % Final   Eosinophils Absolute 02/10/2024 0.1  0.0 - 0.5 K/uL Final    Basophils Relative 02/10/2024 0  % Final   Basophils Absolute 02/10/2024 0.1  0.0 - 0.1 K/uL Final   Immature Granulocytes 02/10/2024 1  % Final   Abs Immature Granulocytes 02/10/2024 0.08 (H)  0.00 - 0.07 K/uL Final   Performed at [facility], [address], [place], [place] [address]   Sodium 02/10/2024 136  135 - 145 mmol/L Final   Potassium 02/10/2024 3.9  3.5 - 5.1 mmol/L Final   Chloride 02/10/2024 101  98 - 111 mmol/L Final   CO2 02/10/2024 25  22 - 32 mmol/L Final   Glucose, Bld 02/10/2024 94  70 - 99 mg/dL Final   Glucose reference range applies only to samples taken after fasting for at least 8 hours.   BUN 02/10/2024 7  6 - 20 mg/dL Final   Creatinine, Ser 02/10/2024 0.66  0.44 - 1.00 mg/dL Final   Calcium 93/84/7974 8.7 (L)  8.9 - 10.3 mg/dL Final   Total Protein 93/84/7974 6.8  6.5 - 8.1 g/dL Final   Albumin 93/84/7974 3.2 (L)  3.5 - 5.0 g/dL Final   AST 93/84/7974 215 (H)  15 - 41 U/L Final   ALT 02/10/2024 143 (H)  0 - 44 U/L Final   Alkaline Phosphatase 02/10/2024 82  38 - 126 U/L Final   Total Bilirubin 02/10/2024 1.0  0.0 - 1.2 mg/dL Final   GFR, Estimated 02/10/2024 >60  >60 mL/min Final   Comment: (NOTE) Calculated using the CKD-EPI Creatinine Equation (2021)    Anion gap 02/10/2024 10  5 - 15 Final   Performed at [facility], [address] [place], [place] [address]   Lipase 02/10/2024 25  11 - 51 U/L Final   Performed at [facility], [address] [place], [place] [address]   Specimen Source 02/10/2024 URINE, CLEAN CATCH   Final   Color, Urine 02/10/2024 YELLOW  YELLOW Final   APPearance 02/10/2024 CLOUDY (  A)  CLEAR Final   Specific Gravity, Urine 02/10/2024 1.015  1.005 - 1.030 Final   pH 02/10/2024 7.0  5.0 - 8.0 Final   Glucose, UA 02/10/2024 NEGATIVE  NEGATIVE mg/dL Final   Hgb urine dipstick 02/10/2024 NEGATIVE  NEGATIVE Final   Bilirubin Urine 02/10/2024 NEGATIVE  NEGATIVE Final   Ketones, ur 02/10/2024 NEGATIVE  NEGATIVE  mg/dL Final   Protein, ur 93/84/7974 30 (A)  NEGATIVE mg/dL Final   Nitrite 93/84/7974 POSITIVE (A)  NEGATIVE Final   Leukocytes,Ua 02/10/2024 MODERATE (A)  NEGATIVE Final   RBC / HPF 02/10/2024 0-5  0 - 5 RBC/hpf Final   WBC, UA 02/10/2024 21-50  0 - 5 WBC/hpf Final   Comment:        Reflex urine culture not performed if WBC <=10, OR if Squamous epithelial cells >5. If Squamous epithelial cells >5 suggest recollection.    Bacteria, UA 02/10/2024 FEW (A)  NONE SEEN Final   Squamous Epithelial / HPF 02/10/2024 0-5  0 - 5 /HPF Final   Mucus 02/10/2024 PRESENT   Final   Amorphous Crystal 02/10/2024 PRESENT   Final   Performed at [facility], [address], [place], [place] [address]   Preg Test, Ur 02/10/2024 NEGATIVE  NEGATIVE Final   Comment:        THE SENSITIVITY OF THIS METHODOLOGY IS >25 mIU/mL. Performed at [facility], [address] [place], [place] [address]    Specimen Description 02/10/2024 BLOOD RIGHT FOREARM   Final   Special Requests 02/10/2024 BOTTLES DRAWN AEROBIC AND ANAEROBIC Blood Culture adequate volume   Final   Culture 02/10/2024    Final                   Value:NO GROWTH 5 DAYS Performed at [facility], [address], [place], [place] [address]    Report Status 02/10/2024 02/15/2024 FINAL   Final   Specimen Description 02/10/2024 BLOOD LEFT ANTECUBITAL   Final   Special Requests 02/10/2024 BOTTLES DRAWN AEROBIC AND ANAEROBIC Blood Culture results may not be optimal due to an inadequate volume of blood received in culture bottles   Final   Culture 02/10/2024    Final                   Value:NO GROWTH 5 DAYS Performed at [facility], [address], [place], [place] [address]    Report Status 02/10/2024 02/15/2024 FINAL   Final   Neisseria Gonorrhea 02/10/2024 Positive (A)   Final   Chlamydia 02/10/2024 Negative   Final   Comment 02/10/2024 Normal Reference Ranger Chlamydia - Negative   Final   Comment 02/10/2024 Normal  Reference Range Neisseria Gonorrhea - Negative   Final   Yeast Wet Prep HPF POC 02/10/2024 NONE SEEN  NONE SEEN Final   Trich, Wet Prep 02/10/2024 NONE SEEN  NONE SEEN Final   Clue Cells Wet Prep HPF POC 02/10/2024 NONE SEEN  NONE SEEN Final   WBC, Wet Prep HPF POC 02/10/2024 >=10 (A)  <10 Final   Sperm 02/10/2024 NONE SEEN   Final   Performed at [facility], [address], [place], [place] [address]   HIV Screen 4th Generation wRfx 02/10/2024 Non Reactive  Non Reactive Final   Performed at [facility], [address] [place], [place] [address]   RPR Ser Ql 02/10/2024 NON REACTIVE  NON REACTIVE Final   Performed at [facility], [address], [place], [place] [address]   Hepatitis B Surface Ag 02/10/2024 NON REACTIVE  NON REACTIVE Final   HCV Ab 02/10/2024 Reactive (A)  NON REACTIVE Final   Comment: (NOTE) The CDC recommends that a Reactive HCV antibody result be followed up  with a HCV Nucleic Acid Amplification test.     Hep A IgM 02/10/2024 NON REACTIVE  NON REACTIVE Final   Hep B C IgM 02/10/2024 NON REACTIVE  NON REACTIVE Final   Performed at [facility], [address], [place], [place] [address]   Specimen Description 02/10/2024 URINE, RANDOM   Final   Special Requests 02/10/2024    Final                   Value:NONE Reflexed from [id number] Performed at [facility], [address], [place], [place] [address]    Culture 02/10/2024 MULTIPLE SPECIES PRESENT, SUGGEST RECOLLECTION (A)   Final   Report Status 02/10/2024 02/11/2024 FINAL   Final   WBC 02/11/2024 17.9 (H)  4.0 - 10.5 K/uL Final   RBC 02/11/2024 4.32  3.87 - 5.11 MIL/uL Final   Hemoglobin 02/11/2024 12.4  12.0 - 15.0 g/dL Final   HCT 93/83/7974 38.1  36.0 - 46.0 % Final   MCV 02/11/2024 88.2  80.0 - 100.0 fL Final   MCH 02/11/2024 28.7  26.0 - 34.0 pg Final   MCHC 02/11/2024 32.5  30.0 - 36.0 g/dL Final   RDW 93/83/7974 14.9  11.5 - 15.5 % Final   Platelets 02/11/2024 328  150 -  400 K/uL Final   nRBC 02/11/2024 0.0  0.0 - 0.2 % Final   Performed at [facility], [address], [place], [place] [address]   Sodium 02/11/2024 134 (L)  135 - 145 mmol/L Final   Potassium 02/11/2024 4.1  3.5 - 5.1 mmol/L Final   Chloride 02/11/2024 103  98 - 111 mmol/L Final   CO2 02/11/2024 23  22 - 32 mmol/L Final   Glucose, Bld 02/11/2024 245 (H)  70 - 99 mg/dL Final   Glucose reference range applies only to samples taken after fasting for at least 8 hours.   BUN 02/11/2024 9  6 - 20 mg/dL Final   Creatinine, Ser 02/11/2024 0.76  0.44 - 1.00 mg/dL Final   Calcium 93/83/7974 7.9 (L)  8.9 - 10.3 mg/dL Final   Total Protein 93/83/7974 5.7 (L)  6.5 - 8.1 g/dL Final   Albumin 93/83/7974 2.3 (L)  3.5 - 5.0 g/dL Final   AST 93/83/7974 109 (H)  15 - 41 U/L Final   ALT 02/11/2024 95 (H)  0 - 44 U/L Final   Alkaline Phosphatase 02/11/2024 72  38 - 126 U/L Final   Total Bilirubin 02/11/2024 0.7  0.0 - 1.2 mg/dL Final   GFR, Estimated 02/11/2024 >60  >60 mL/min Final   Comment: (NOTE) Calculated using the CKD-EPI Creatinine Equation (2021)    Anion gap 02/11/2024 8  5 - 15 Final   Performed at [facility], [address], [place], [place] [address]   HCV RNA Qnt(log copy/mL) 02/11/2024 6.778  log10 IU/mL Corrected   HepC Qn 02/11/2024 6,000,000  IU/mL Final   Test Information 02/11/2024 Comment   Final   Comment: (NOTE) The quantitative range of this assay is 15 IU/mL to 100 million IU/mL.    Hcv Genotype 02/11/2024 Comment   Final   Comment: (NOTE) To be performed on  this specimen. Performed At: [facility] [address] [place], [place] [id number] Jennette Shorter MD Ey:[id number]    aPTT 02/11/2024 37 (H)  24 - 36 seconds Final   Comment:        IF BASELINE aPTT IS ELEVATED, SUGGEST PATIENT RISK ASSESSMENT BE USED TO DETERMINE APPROPRIATE ANTICOAGULANT THERAPY. Performed at [facility], [address] [place], [place] [address]    Prothrombin  Time 02/11/2024 15.4 (H)  11.4 - 15.2 seconds Final   INR 02/11/2024 1.2  0.8 - 1.2 Final   Comment: (NOTE) INR goal varies based on device and disease states. Performed at [facility], [address], [place], [place] [address]    Fibrinogen  02/11/2024 524 (H)  210 - 475 mg/dL Final   Comment: (NOTE) Fibrinogen  results may be underestimated in patients receiving thrombolytic therapy. Performed at [facility], [address], [place], [place] [address]    TSH 02/11/2024 0.373  0.350 - 4.500 uIU/mL Final   Comment: Performed by a 3rd Generation assay with a functional sensitivity of <=0.01 uIU/mL. Performed at [facility], [address], [place], [place] [address]    WBC 02/12/2024 9.0  4.0 - 10.5 K/uL Final   RBC 02/12/2024 4.32  3.87 - 5.11 MIL/uL Final   Hemoglobin 02/12/2024 12.6  12.0 - 15.0 g/dL Final   HCT 93/82/7974 38.4  36.0 - 46.0 % Final   MCV 02/12/2024 88.9  80.0 - 100.0 fL Final   MCH 02/12/2024 29.2  26.0 - 34.0 pg Final   MCHC 02/12/2024 32.8  30.0 - 36.0 g/dL Final   RDW 93/82/7974 14.9  11.5 - 15.5 % Final   Platelets 02/12/2024 340  150 - 400 K/uL Final   nRBC 02/12/2024 0.0  0.0 - 0.2 % Final   Performed at [facility], [address] [place], [place] [address]   Sodium 02/12/2024 138  135 - 145 mmol/L Final   Potassium 02/12/2024 3.9  3.5 - 5.1 mmol/L Final   Chloride 02/12/2024 106  98 - 111 mmol/L Final   CO2 02/12/2024 22  22 - 32 mmol/L Final   Glucose, Bld 02/12/2024 147 (H)  70 - 99 mg/dL Final   Glucose reference range applies only to samples taken after fasting for at least 8 hours.   BUN 02/12/2024 10  6 - 20 mg/dL Final   Creatinine, Ser 02/12/2024 0.66  0.44 - 1.00 mg/dL Final   Calcium 93/82/7974 8.2 (L)  8.9 - 10.3 mg/dL Final   Total Protein 93/82/7974 5.9 (L)  6.5 - 8.1 g/dL Final   Albumin 93/82/7974 2.2 (L)  3.5 - 5.0 g/dL Final   AST 93/82/7974 133 (H)  15 - 41 U/L Final   ALT 02/12/2024 93 (H)  0 - 44 U/L  Final   Alkaline Phosphatase 02/12/2024 63  38 - 126 U/L Final   Total Bilirubin 02/12/2024 0.4  0.0 - 1.2 mg/dL Final   GFR, Estimated 02/12/2024 >60  >60 mL/min Final   Comment: (NOTE) Calculated using the CKD-EPI Creatinine Equation (2021)    Anion gap 02/12/2024 10  5 - 15 Final   Performed at [facility], [address] [place], [place] [address]   Hgb A1c MFr Bld 02/12/2024 5.4  4.8 - 5.6 % Final   Comment: (NOTE) Diagnosis of Diabetes The following HbA1c ranges recommended by the American Diabetes Association (ADA) may be used as an aid in the diagnosis of  diabetes mellitus.  Hemoglobin             Suggested A1C NGSP%              Diagnosis  <5.7                   Non Diabetic  5.7-6.4                Pre-Diabetic  >6.4                   Diabetic  <7.0                   Glycemic control for                       adults with diabetes.     Mean Plasma Glucose 02/12/2024 108.28  mg/dL Final   Performed at [facility], [address], [place], [place] [address]   Hepatitis C Genotype 02/11/2024 1a   Final   Comment: (NOTE) This test was developed and its performance characteristics determined by [facility]. It has not been cleared or approved by the Food and Drug Administration. Performed At: [facility] [address] [place], [place] [id number] Jennette Shorter MD Ey:[id number]    Blood Alcohol level:  Lab Results  Component Value Date   [facility] <15 07/08/2024   Metabolic Disorder Labs: Lab Results  Component Value Date   HGBA1C 5.5 07/08/2024   MPG 111.15 07/08/2024   MPG 108.28 02/12/2024   No results found for: PROLACTIN Lab Results  Component Value Date   CHOL 174 07/08/2024   TRIG 102 07/08/2024   HDL 42 07/08/2024   CHOLHDL 4.1 07/08/2024   VLDL 20 07/08/2024   LDLCALC 112 (H) 07/08/2024   LDLCALC 73 09/22/2020   Therapeutic Lab Levels: No results found for: LITHIUM No results found for: VALPROATE No results found for:  CBMZ  Physical Findings   GAD-7    Flowsheet Row [facility] Office Visit from 09/22/2020 in [facility] [employer] [facility] Clinical Support from 06/21/2020 in [facility] [employer] [facility] Clinical Support from 04/05/2020 in [facility] [employer] [facility] Clinical Support from 01/19/2020 in [facility] at [facility]  Total GAD-7 Score 7 2 10 8  0   PHQ2-9    Flowsheet Row ED from 07/10/2024 in [facility] ED from 07/08/2024 in [facility] Procedure visit from 11/01/2020 in [facility] at [facility] Office Visit from 09/22/2020 in [facility] [employer] [facility] at [facility] Clinical Support from 06/21/2020 in [facility] [employer] [facility] at [facility]  PHQ-2 Total Score 6 4 0 0 2  PHQ-9 Total Score 21 12 0 2 10   Flowsheet Row ED from 07/10/2024 in [facility] ED from 07/08/2024 in [facility] ED from 05/17/2024 in [facility] Department at [facility]  C-SSRS RISK CATEGORY No Risk No Risk No Risk    Musculoskeletal  Strength & Muscle Tone: within normal limits Gait & Station: normal Patient leans: N/A  Psychiatric Specialty Exam  Presentation  General Appearance:  Fairly Groomed  Eye Contact: Fair  Speech: Clear and Coherent  Speech Volume: Normal  Handedness: Right  Mood and Affect  Mood: Depressed  Affect: Congruent  Thought Process  Thought Processes: Coherent  Descriptions of Associations:Intact  Orientation:Full (Time, Place and Person)  Thought Content:Logical  Diagnosis of Schizophrenia or Schizoaffective disorder in past:  No    Hallucinations:Hallucinations: None  Ideas of Reference:None  Suicidal Thoughts:Suicidal Thoughts: No  Homicidal Thoughts:Homicidal Thoughts: No  Sensorium  Memory: Immediate Fair  Judgment: Fair  Insight: Fair  Art Therapist  Concentration: Fair  Attention Span: Fair  Recall: Fair  Fund of Knowledge: Fair  Language: Fair  Psychomotor Activity  Psychomotor Activity: Psychomotor Activity: Normal  Assets  Assets: Desire for Improvement; Resilience  Sleep  Sleep: Sleep: Good  Estimated Sleeping Duration (Last 24 Hours): 8.75-10.25 hours (Due to Daylight Saving Time, the durations displayed may not accurately represent documentation during the time change interval)  Nutritional Assessment (For OBS and [facility] admissions only) Has the patient had a weight loss or gain of 10 pounds or more in the last 3 months?: No Has the patient had a decrease in food intake/or appetite?: No Does the patient have dental problems?: No Does the patient have eating habits or behaviors that may be indicators of an eating disorder including binging or inducing vomiting?: No Has the patient recently lost weight without trying?: 0 Has the patient been eating poorly because of a decreased appetite?: 0 Malnutrition Screening Tool Score: 0   Physical Exam  Physical Exam Vitals and nursing note reviewed.  HENT:     Head: Normocephalic.  Eyes:     Pupils: Pupils are equal, round, and reactive to light.  Neurological:     General: No focal deficit present.     Mental Status: She is oriented to person, place, and time.    Review of Systems  Psychiatric/Behavioral:  Positive for depression and substance abuse. Negative for hallucinations, memory loss and suicidal ideas. The patient is nervous/anxious and has insomnia.    Blood pressure 118/67, pulse 96, temperature 98.2 F (36.8 C), temperature source Oral, resp. rate 18, SpO2 98%. There is no height or weight on file  to calculate BMI.  Treatment Plan Summary: Daily contact with patient to assess and evaluate symptoms and progress in treatment and Medication management  Safety and Monitoring: Voluntary admission to inpatient psychiatric unit for safety, stabilization and treatment Daily contact with patient to assess and evaluate symptoms and progress in treatment Patient's case to be discussed in multi-disciplinary team meeting Observation Level : q15 minute checks Vital signs: q12 hours Precautions: Safety  Long Term Goal(s): Improvement in symptoms so as ready for discharge  Short Term Goals: Ability to disclose and discuss suicidal ideas, Compliance with prescribed medications will improve, and Ability to identify triggers associated with substance abuse/mental health issues will improve  Diagnoses Principal Problem:   Polysubstance abuse (HCC) Active Problems:   Opioid dependence with uncomplicated intoxication (HCC)   Drug-induced mood disorder (HCC)   Elevated liver function tests   Opioid use with withdrawal (HCC)  Medications:  busPIRone  15 mg Oral BID   feeding supplement  237 mL Oral TID BM   nicotine   21 mg Transdermal Daily   nitrofurantoin (macrocrystal-monohydrate)  100 mg Oral Q12H   QUEtiapine  100 mg Oral BH-q7a   QUEtiapine  250 mg Oral QHS    PRNS -Continue Tylenol  650 mg every 6 hours PRN for mild pain -Continue Maalox 30 mg every 4 hrs PRN for indigestion -Continue Milk  of Magnesia as needed every 6 hrs for constipation  Discharge Planning: Social work and case management to assist with discharge planning and identification of hospital follow-up needs prior to discharge Estimated LOS: 5-7 days Discharge Concerns: Need to establish a safety plan; Medication compliance and effectiveness Discharge Goals: Return home with outpatient referrals for mental health follow-up including medication management/psychotherapy  I certify that inpatient services furnished can  reasonably be expected to improve the patient's condition.    Donia Snell, NP 11/18/20255:14 PM

## 2024-07-15 NOTE — ED Notes (Signed)
Patient sleeping with no s/s of distress.

## 2024-07-15 NOTE — Group Note (Signed)
 Group Topic: Social Support  Group Date: 07/15/2024 Start Time: 1945 End Time: 2015 Facilitators: Joan Plowman B  Department: [facility]  Number of Participants: 4  Group Focus: clarity of thought and daily focus Treatment Modality:  Individual Therapy Interventions utilized were support Purpose: express feelings, increase insight, reinforce self-care, and relapse prevention strategies  Name: Morgan Donaldson Date of Birth: 05-Sep-1980  MR: [id number]    Level of Participation: active Quality of Participation: attentive and cooperative Interactions with others: gave feedback Mood/Affect: appropriate Triggers (if applicable): NA Cognition: coherent/clear Progress: Gaining insight Response: NA Plan: patient will be encouraged to keep going to groups  Patients Problems:  Patient Active Problem List   Diagnosis Date Noted   Opioid dependence with uncomplicated intoxication (HCC) 07/14/2024   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   Elevated liver function tests 07/14/2024   Opioid use with withdrawal (HCC) 07/14/2024   Polysubstance abuse (HCC) 07/10/2024   Pyosalpingitis 02/12/2024   Hepatitis C antibody detected 02/11/2024   PID (acute pelvic inflammatory disease) 02/10/2024   Gonorrhea 12/10/2023   Group A streptococcal infection 12/09/2023   IV drug abuse (HCC) 12/09/2023   Acute cystitis 12/09/2023   GAD (generalized anxiety disorder) 06/21/2020   Attention deficit hyperactivity disorder (ADHD), 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

## 2024-07-16 DIAGNOSIS — F1924 Other psychoactive substance dependence with psychoactive substance-induced mood disorder: Secondary | ICD-10-CM | POA: Diagnosis not present

## 2024-07-16 DIAGNOSIS — F411 Generalized anxiety disorder: Secondary | ICD-10-CM | POA: Diagnosis not present

## 2024-07-16 DIAGNOSIS — F11220 Opioid dependence with intoxication, uncomplicated: Secondary | ICD-10-CM | POA: Diagnosis not present

## 2024-07-16 DIAGNOSIS — F1420 Cocaine dependence, uncomplicated: Secondary | ICD-10-CM | POA: Diagnosis not present

## 2024-07-16 DIAGNOSIS — E0590 Thyrotoxicosis, unspecified without thyrotoxic crisis or storm: Secondary | ICD-10-CM

## 2024-07-16 MED ORDER — QUETIAPINE FUMARATE 300 MG PO TABS
300.0000 mg | ORAL_TABLET | Freq: Every day | ORAL | Status: DC
  Administered 2024-07-16 – 2024-07-21 (×6): 300 mg via ORAL
  Filled 2024-07-16 (×6): qty 1

## 2024-07-16 MED ORDER — BUSPIRONE HCL 10 MG PO TABS
20.0000 mg | ORAL_TABLET | Freq: Two times a day (BID) | ORAL | Status: DC
  Administered 2024-07-17 – 2024-07-22 (×11): 20 mg via ORAL
  Filled 2024-07-16 (×9): qty 2
  Filled 2024-07-16: qty 28
  Filled 2024-07-16 (×2): qty 2

## 2024-07-16 NOTE — ED Notes (Signed)
 Paitent attended Peer Support group.

## 2024-07-16 NOTE — ED Notes (Signed)
 Pt is sleeping, no acute distress noted. Respirations are even and unlabored. Q15 safety checks in place.

## 2024-07-16 NOTE — ED Notes (Signed)
 Paitent provided breakfast.

## 2024-07-16 NOTE — ED Provider Notes (Signed)
 Behavioral Health Progress Note  Date and Time: 07/16/2024 6:24 PM Name: Morgan Donaldson MRN:  [id number]  Subjective:  Patient reports difficulty with panic attacks which occurred yesterday and today. Sleep has improved with quetiapine  increase. Medications generally well-tolerated but patient believes she needs more buspirone  as well. Patient really likes the taste of Ensure and claims extensive use (TID) also a function of limited food palatability and even when it tastes good, there is insufficient portions. Nicotine  gum difficult to use (edentulous) but she is open to trying other forms. At this time most prominent craving is cigarettes.  Diagnosis:  Final diagnoses:  Polysubstance abuse (HCC)  Opioid dependence with uncomplicated intoxication (HCC)  Drug-induced mood disorder (HCC)  Elevated liver function tests  Opioid use with withdrawal (HCC)  Hyperthyroidism  Generalized anxiety disorder   Total Time spent with patient: I personally spent 20 minutes on the unit in direct patient care. The direct patient care time included face-to-face time with the patient, reviewing the patient's chart, communicating with other professionals, and coordinating care. Greater than 50% of this time was spent in counseling or coordinating care with the patient regarding goals of hospitalization, psycho-education, and discharge planning needs.   On my assessment the patient denied SI, HI, AVH, paranoia, ideas of reference, or first rank symptoms. Patient denied drug cravings (except nicotine ) or active signs of withdrawal. Patient denied medication side-effects. Patient was not deemed to be a danger to self or others.     Provider Admission HPI: Morgan Donaldson is  a 43 year old female who presented  to [facility] with a history of Opioid Use Disorder, severe and Stimulant Use Disorder, cocaine type, severe along with untreated Bipolar Disorder. Pt was transferred to Facility Based Crisis unit for detox from  Fentanyl  and crack cocaine. UDS positive for cocaine, morphine  and THC. BAL upon admission is negative.    Patient reported she has been using 1/2 gram of fentanyl  IV for the past year.  She has also been smoking approximately 1/2 gram of crack cocaine daily for the past year.  Patient stated she is Donaldson with using, stating she had this realization when she woke up [place] a bridge this morning.  Patient has been homeless for the past year.  She reported  she has a boyfriend, and things are going well outside of patient's substance use issues.  Patient stated her boyfriend is supportive of her seeking treatment and will allow her to stay with him once she is clean and has completed treatment.  Patient reported  she is also estranged from her sister and other family members due to her ongoing issues with substance use.  She is hopeful that after treatment she can reconnect with family.    Subjective: Upon assessment today patient is observed in the day room watching TV and talking with peers.  Patient reports decreased sleep last night and has an increased appetite.  Patient denies suicidal ideations, homicidal ideations and hallucinations. She endorses current withdrawal symptoms including anxiety, body aches, nausea and runny nose.  Patient reports history of bipolar disorder and is not currently taking any medications.  She reports previously taking Seroquel  75 mg 3 times daily for mood stabilization.  Patient reports ongoing anxiety and initially requests Ativan  as needed for anxiety.  After educating patient on this medication and its risks for dependency, patient agreed to trial an increase of hydroxyzine .  Patient is currently voluntary and is hopeful for residential treatment after detox.  Patient inquires about Suboxone   but is unsure at this time due to lack of follow-up and being unsure if she will be able to get into a residential treatment facility due to her insurance.  Will further discuss at  patient's discretion.  Patient denies any physical complaints or side effects from medications this time.  Patient encouraged to engage in group programming and assessments with staff.  Patient is pleasant and cooperative, agreeable to treatment plan.   Past Psychiatric History: Bipolar d/o, PTSD, and polysubstance abuse.  Past Medical History:         Patient Active Problem List    Diagnosis Date Noted   Polysubstance abuse (HCC) 07/10/2024   Pyosalpingitis 02/12/2024   Hepatitis C antibody detected 02/11/2024   PID (acute pelvic inflammatory disease) 02/10/2024   Gonorrhea 12/10/2023   Group A streptococcal infection 12/09/2023   IV drug abuse (HCC) 12/09/2023   Acute cystitis 12/09/2023   GAD (generalized anxiety disorder) 06/21/2020   Attention deficit hyperactivity disorder (ADHD), 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           Past Medical History:  Diagnosis Date   Bronchitis     Hydrosalpinx      bilateral   Palpitations     Physiological ovarian cysts           Family History:           Family History  Problem Relation Age of Onset   Lung cancer Mother          died at age 38   CAD Mother          MI at age 37   Hypertension Mother     Schizophrenia Father     Hypertension Sister     Hyperlipidemia Sister     COPD Sister     Healthy Brother     Healthy Sister           Social History: Currently homeless, unemployed and in a relationship. She reports plan to live with boyfriend after finishing residential treatment. She endorses fentanyl , cocaine and THC use.   Sleep: Fair  Appetite:  Fair  Current Medications:  Current Facility-Administered Medications  Medication Dose Route Frequency Provider Last Rate Last  Admin   acetaminophen  (TYLENOL ) tablet 650 mg  650 mg Oral Q6H PRN Randall Starlyn HERO, NP   650 mg at 07/13/24 0836   alum & mag hydroxide-simeth (MAALOX/MYLANTA) 200-200-20 MG/5ML suspension 30 mL  30 mL Oral Q4H PRN Onuoha, Chinwendu V, NP   30 mL at 07/15/24 2106   [START ON 07/17/2024] busPIRone  (BUSPAR ) tablet 20 mg  20 mg Oral BID Makaylia Hewett C, MD       haloperidol  (HALDOL ) tablet 5 mg  5 mg Oral TID PRN Randall Starlyn HERO, NP   5 mg at 07/15/24 1358   And   diphenhydrAMINE  (BENADRYL ) capsule 50 mg  50 mg Oral TID PRN Randall Starlyn HERO, NP   50 mg at 07/15/24 1358   haloperidol  lactate (HALDOL ) injection 5 mg  5 mg Intramuscular TID PRN Randall Starlyn HERO, NP       And   diphenhydrAMINE  (BENADRYL ) injection 50 mg  50 mg Intramuscular TID PRN Randall Starlyn HERO, NP       And   LORazepam  (  ATIVAN ) injection 2 mg  2 mg Intramuscular TID PRN Byungura, Veronique M, NP       haloperidol  lactate (HALDOL ) injection 10 mg  10 mg Intramuscular TID PRN Randall Starlyn HERO, NP       And   diphenhydrAMINE  (BENADRYL ) injection 50 mg  50 mg Intramuscular TID PRN Randall Starlyn HERO, NP       And   LORazepam  (ATIVAN ) injection 2 mg  2 mg Intramuscular TID PRN Randall, Veronique M, NP       feeding supplement (ENSURE PLUS HIGH PROTEIN) liquid 237 mL  237 mL Oral TID BM Gottfried, Rhoda J, MD   237 mL at 07/16/24 1310   magnesium  hydroxide (MILK OF MAGNESIA) suspension 30 mL  30 mL Oral Daily PRN Randall Starlyn HERO, NP       nicotine  (NICODERM CQ  - dosed in mg/24 hours) patch 21 mg  21 mg Transdermal Daily Byungura, Veronique M, NP   21 mg at 07/16/24 9072   nicotine  polacrilex (NICORETTE) gum 2 mg  2 mg Oral PRN Gottfried, Rhoda J, MD   2 mg at 07/15/24 1813   nitrofurantoin (macrocrystal-monohydrate) (MACROBID) capsule 100 mg  100 mg Oral Q12H Ezzard Staci SAILOR, NP   100 mg at 07/16/24 0927   QUEtiapine (SEROQUEL) tablet 100 mg  100 mg Oral Landrum Hahn, Babe Clenney C, MD   100  mg at 07/16/24 9176   QUEtiapine (SEROQUEL) tablet 300 mg  300 mg Oral QHS Hahn Kandi BROCKS, MD       traZODone  (DESYREL ) tablet 100 mg  100 mg Oral QHS PRN Byungura, Veronique M, NP   100 mg at 07/15/24 2105   No current outpatient medications on file.    Labs  Lab Results:  Admission on 07/10/2024  Component Date Value Ref Range Status   T3, Free 07/11/2024 2.2  2.0 - 4.4 pg/mL Final   Comment: (NOTE) Performed At: [facility] [address] [place], [place] [id number] Jennette Shorter MD Ey:[id number]    Free T4 07/11/2024 0.71  0.61 - 1.12 ng/dL Final   Comment: (NOTE) Biotin ingestion may interfere with free T4 tests. If the results are inconsistent with the TSH level, previous test results, or the clinical presentation, then consider biotin interference. If needed, order repeat testing after stopping biotin. Performed at [facility] Lab, [address], [place], [place] [address]    Neisseria Gonorrhea 07/12/2024 Negative   Final   Chlamydia 07/12/2024 Negative   Final   Comment 07/12/2024 Normal Reference Ranger Chlamydia - Negative   Final   Comment 07/12/2024 Normal Reference Range Neisseria Gonorrhea - Negative   Final   Total Protein 07/12/2024 7.1  6.5 - 8.1 g/dL Final   Albumin 88/84/7974 3.3 (L)  3.5 - 5.0 g/dL Final   AST 88/84/7974 67 (H)  15 - 41 U/L Final   ALT 07/12/2024 95 (H)  0 - 44 U/L Final   Alkaline Phosphatase 07/12/2024 110  38 - 126 U/L Final   Total Bilirubin 07/12/2024 0.6  0.0 - 1.2 mg/dL Final   Bilirubin, Direct 07/12/2024 <0.1  0.0 - 0.2 mg/dL Final   Indirect Bilirubin 07/12/2024 NOT CALCULATED  0.3 - 0.9 mg/dL Final   Performed at [facility] Lab, [address] [place], [place] [address]   RPR Ser Ql 07/12/2024 NON REACTIVE  NON REACTIVE Final   Performed at [facility], [address], [place], [place] [address]   [facility] test code 07/12/2024 [phone]  Final   [facility] test name 07/12/2024 [id number]   Final   Source  ([facility]) 07/12/2024 SERUM   Final   Performed at [facility] Lab, [address] [place], [place] [address]   Misc [facility] result 07/12/2024 COMMENT   Final   Comment: (NOTE) Test Ordered: [id number] HIV Ab/p24 Ag with Reflex HIV Ab/p24 Ag Screen           Note:                     BN     Non Reactive                                                  Reference Range: Non Reactive                          HIV-1/HIV-2 antibodies and HIV-1 p24 antigen were NOT detected. There is no laboratory evidence of HIV infection. HIV Negative Performed At: [facility] [address] [place], [place] [id number] Jennette Shorter MD Ey:[id number]   Admission on 07/08/2024, Discharged on 07/10/2024  Component Date Value Ref Range Status   WBC 07/08/2024 10.6 (H)  4.0 - 10.5 K/uL Final   RBC 07/08/2024 5.65 (H)  3.87 - 5.11 MIL/uL Final   Hemoglobin 07/08/2024 16.5 (H)  12.0 - 15.0 g/dL Final   HCT 88/88/7974 49.4 (H)  36.0 - 46.0 % Final   MCV 07/08/2024 87.4  80.0 - 100.0 fL Final   MCH 07/08/2024 29.2  26.0 - 34.0 pg Final   MCHC 07/08/2024 33.4  30.0 - 36.0 g/dL Final   RDW 88/88/7974 13.7  11.5 - 15.5 % Final   Platelets 07/08/2024 422 (H)  150 - 400 K/uL Final   nRBC 07/08/2024 0.0  0.0 - 0.2 % Final   Neutrophils Relative % 07/08/2024 75  % Final   Neutro Abs 07/08/2024 7.9 (H)  1.7 - 7.7 K/uL Final   Lymphocytes Relative 07/08/2024 17  % Final   Lymphs Abs 07/08/2024 1.8  0.7 - 4.0 K/uL Final   Monocytes Relative 07/08/2024 7  % Final   Monocytes Absolute 07/08/2024 0.7  0.1 - 1.0 K/uL Final   Eosinophils Relative 07/08/2024 0  % Final   Eosinophils Absolute 07/08/2024 0.0  0.0 - 0.5 K/uL Final   Basophils Relative 07/08/2024 1  % Final   Basophils Absolute 07/08/2024 0.1  0.0 - 0.1 K/uL Final   Immature Granulocytes 07/08/2024 0  % Final   Abs Immature Granulocytes 07/08/2024 0.04  0.00 - 0.07 K/uL Final   Performed at [facility], [address], [place], [place] [address]    Sodium 07/08/2024 140  135 - 145 mmol/L Final   Potassium 07/08/2024 4.1  3.5 - 5.1 mmol/L Final   Chloride 07/08/2024 100  98 - 111 mmol/L Final   CO2 07/08/2024 27  22 - 32 mmol/L Final   Glucose, Bld 07/08/2024 113 (H)  70 - 99 mg/dL Final   Glucose reference range applies only to samples taken after fasting for at least 8 hours.   BUN 07/08/2024 10  6 - 20 mg/dL Final   Creatinine, Ser 07/08/2024 0.74  0.44 - 1.00 mg/dL Final   Calcium 88/88/7974 9.9  8.9 - 10.3 mg/dL Final   Total Protein 88/88/7974  7.9  6.5 - 8.1 g/dL Final   Albumin 88/88/7974 3.5  3.5 - 5.0 g/dL Final   AST 88/88/7974 109 (H)  15 - 41 U/L Final   ALT 07/08/2024 132 (H)  0 - 44 U/L Final   Alkaline Phosphatase 07/08/2024 74  38 - 126 U/L Final   Total Bilirubin 07/08/2024 0.3  0.0 - 1.2 mg/dL Final   GFR, Estimated 07/08/2024 >60  >60 mL/min Final   Comment: (NOTE) Calculated using the CKD-EPI Creatinine Equation (2021)    Anion gap 07/08/2024 13  5 - 15 Final   Performed at [facility] Lab, [address], [place], [place] [address]   Hgb A1c MFr Bld 07/08/2024 5.5  4.8 - 5.6 % Final   Comment: (NOTE) Diagnosis of Diabetes The following HbA1c ranges recommended by the American Diabetes Association (ADA) may be used as an aid in the diagnosis of diabetes mellitus.  Hemoglobin             Suggested A1C NGSP%              Diagnosis  <5.7                   Non Diabetic  5.7-6.4                Pre-Diabetic  >6.4                   Diabetic  <7.0                   Glycemic control for                       adults with diabetes.     Mean Plasma Glucose 07/08/2024 111.15  mg/dL Final   Performed at [facility] Lab, [address] [place], [place] [address]   Magnesium  07/08/2024 2.1  1.7 - 2.4 mg/dL Final   Performed at [facility] Lab, [address], [place], [place] [address]   Alcohol, Ethyl (B) 07/08/2024 <15  <15 mg/dL Final   Comment: (NOTE) For medical purposes only. Performed at  [facility], [address] [place], [place] [address]    Cholesterol 07/08/2024 174  0 - 200 mg/dL Final   Triglycerides 88/88/7974 102  <150 mg/dL Final   HDL 88/88/7974 42  >40 mg/dL Final   Total CHOL/HDL Ratio 07/08/2024 4.1  RATIO Final   VLDL 07/08/2024 20  0 - 40 mg/dL Final   LDL Cholesterol 07/08/2024 112 (H)  0 - 99 mg/dL Final   Comment:        Total Cholesterol/HDL:CHD Risk Coronary Heart Disease Risk Table                     Men   Women  1/2 Average Risk   3.4   3.3  Average Risk       5.0   4.4  2 X Average Risk   9.6   7.1  3 X Average Risk  23.4   11.0        Use the calculated Patient Ratio above and the CHD Risk Table to determine the patient's CHD Risk.        ATP III CLASSIFICATION (LDL):  <100     mg/dL   Optimal  899-870  mg/dL   Near or Above  Optimal  130-159  mg/dL   Borderline  839-810  mg/dL   High  >809     mg/dL   Very High Performed at [facility] Lab, [address] [place], [place] [address]    Color, Urine 07/09/2024 YELLOW  YELLOW Final   APPearance 07/09/2024 TURBID (A)  CLEAR Final   Specific Gravity, Urine 07/09/2024 1.017  1.005 - 1.030 Final   pH 07/09/2024 7.0  5.0 - 8.0 Final   Glucose, UA 07/09/2024 NEGATIVE  NEGATIVE mg/dL Final   Hgb urine dipstick 07/09/2024 NEGATIVE  NEGATIVE Final   Bilirubin Urine 07/09/2024 NEGATIVE  NEGATIVE Final   Ketones, ur 07/09/2024 NEGATIVE  NEGATIVE mg/dL Final   Protein, ur 88/87/7974 NEGATIVE  NEGATIVE mg/dL Final   Nitrite 88/87/7974 NEGATIVE  NEGATIVE Final   Leukocytes,Ua 07/09/2024 MODERATE (A)  NEGATIVE Final   RBC / HPF 07/09/2024 0-5  0 - 5 RBC/hpf Final   WBC, UA 07/09/2024 21-50  0 - 5 WBC/hpf Final   Bacteria, UA 07/09/2024 MANY (A)  NONE SEEN Final   Squamous Epithelial / HPF 07/09/2024 0-5  0 - 5 /HPF Final   WBC Clumps 07/09/2024 PRESENT   Final   Performed at [facility] Lab, [address], [place], [place] [address]   Preg Test, Ur 07/09/2024  Negative  Negative Final   POC Amphetamine  UR 07/09/2024 None Detected  NONE DETECTED (Cut Off Level 1000 ng/mL) Final   POC Secobarbital (BAR) 07/09/2024 None Detected  NONE DETECTED (Cut Off Level 300 ng/mL) Final   POC Buprenorphine (BUP) 07/09/2024 None Detected  NONE DETECTED (Cut Off Level 10 ng/mL) Final   POC Oxazepam (BZO) 07/09/2024 None Detected  NONE DETECTED (Cut Off Level 300 ng/mL) Final   POC Cocaine UR 07/09/2024 Positive (A)  NONE DETECTED (Cut Off Level 300 ng/mL) Final   POC Methamphetamine UR 07/09/2024 None Detected  NONE DETECTED (Cut Off Level 1000 ng/mL) Final   POC Morphine  07/09/2024 Positive (A)  NONE DETECTED (Cut Off Level 300 ng/mL) Final   POC Methadone UR 07/09/2024 None Detected  NONE DETECTED (Cut Off Level 300 ng/mL) Final   POC Oxycodone  UR 07/09/2024 None Detected  NONE DETECTED (Cut Off Level 100 ng/mL) Final   POC Marijuana UR 07/09/2024 Positive (A)  NONE DETECTED (Cut Off Level 50 ng/mL) Final   TSH 07/08/2024 0.112 (L)  0.350 - 4.500 uIU/mL Final   Comment: Performed by a 3rd Generation assay with a functional sensitivity of <=0.01 uIU/mL. Performed at [facility] Lab, [address] [place], [place] [address]    Preg Test, Ur 07/09/2024 NEGATIVE  NEGATIVE Final   Comment:        THE SENSITIVITY OF THIS METHODOLOGY IS >20 mIU/mL.   Admission on 05/17/2024, Discharged on 05/18/2024  Component Date Value Ref Range Status   WBC 05/17/2024 9.0  4.0 - 10.5 K/uL Final   RBC 05/17/2024 4.35  3.87 - 5.11 MIL/uL Final   Hemoglobin 05/17/2024 12.8  12.0 - 15.0 g/dL Final   HCT 90/79/7974 39.7  36.0 - 46.0 % Final   MCV 05/17/2024 91.3  80.0 - 100.0 fL Final   MCH 05/17/2024 29.4  26.0 - 34.0 pg Final   MCHC 05/17/2024 32.2  30.0 - 36.0 g/dL Final   RDW 90/79/7974 14.1  11.5 - 15.5 % Final   Platelets 05/17/2024 286  150 - 400 K/uL Final   nRBC 05/17/2024 0.0  0.0 - 0.2 % Final   Neutrophils Relative %  05/17/2024 65  % Final   Neutro Abs 05/17/2024  5.8  1.7 - 7.7 K/uL Final   Lymphocytes Relative 05/17/2024 23  % Final   Lymphs Abs 05/17/2024 2.1  0.7 - 4.0 K/uL Final   Monocytes Relative 05/17/2024 8  % Final   Monocytes Absolute 05/17/2024 0.8  0.1 - 1.0 K/uL Final   Eosinophils Relative 05/17/2024 4  % Final   Eosinophils Absolute 05/17/2024 0.3  0.0 - 0.5 K/uL Final   Basophils Relative 05/17/2024 0  % Final   Basophils Absolute 05/17/2024 0.0  0.0 - 0.1 K/uL Final   Immature Granulocytes 05/17/2024 0  % Final   Abs Immature Granulocytes 05/17/2024 0.04  0.00 - 0.07 K/uL Final   Performed at [facility], [address] [place], [place] [address]   Preg, Serum 05/17/2024 NEGATIVE  NEGATIVE Final   Comment:        THE SENSITIVITY OF THIS METHODOLOGY IS >10 mIU/mL. Performed at [facility], [address], [place], [place] [address]    Sodium 05/17/2024 141  135 - 145 mmol/L Final   Potassium 05/17/2024 3.5  3.5 - 5.1 mmol/L Final   Chloride 05/17/2024 104  98 - 111 mmol/L Final   CO2 05/17/2024 26  22 - 32 mmol/L Final   Glucose, Bld 05/17/2024 118 (H)  70 - 99 mg/dL Final   Glucose reference range applies only to samples taken after fasting for at least 8 hours.   BUN 05/17/2024 11  6 - 20 mg/dL Final   Creatinine, Ser 05/17/2024 0.78  0.44 - 1.00 mg/dL Final   Calcium 90/79/7974 8.8 (L)  8.9 - 10.3 mg/dL Final   Total Protein 90/79/7974 7.0  6.5 - 8.1 g/dL Final   Albumin 90/79/7974 3.0 (L)  3.5 - 5.0 g/dL Final   AST 90/79/7974 115 (H)  15 - 41 U/L Final   ALT 05/17/2024 144 (H)  0 - 44 U/L Final   Alkaline Phosphatase 05/17/2024 71  38 - 126 U/L Final   Total Bilirubin 05/17/2024 0.5  0.0 - 1.2 mg/dL Final   GFR, Estimated 05/17/2024 >60  >60 mL/min Final   Comment: (NOTE) Calculated using the CKD-EPI Creatinine Equation (2021)    Anion gap 05/17/2024 11  5 - 15 Final   Performed at [facility] Lab, [address] [place], [place] [address]   Sodium 05/17/2024 142  135 - 145 mmol/L Final    Potassium 05/17/2024 3.5  3.5 - 5.1 mmol/L Final   Chloride 05/17/2024 103  98 - 111 mmol/L Final   BUN 05/17/2024 12  6 - 20 mg/dL Final   Creatinine, Ser 05/17/2024 0.70  0.44 - 1.00 mg/dL Final   Glucose, Bld 90/79/7974 120 (H)  70 - 99 mg/dL Final   Glucose reference range applies only to samples taken after fasting for at least 8 hours.   Calcium, Ion 05/17/2024 1.13 (L)  1.15 - 1.40 mmol/L Final   TCO2 05/17/2024 28  22 - 32 mmol/L Final   Hemoglobin 05/17/2024 11.6 (L)  12.0 - 15.0 g/dL Final   HCT 90/79/7974 34.0 (L)  36.0 - 46.0 % Final  Admission on 03/25/2024, Discharged on 03/25/2024  Component Date Value Ref Range Status   Sodium 03/25/2024 140  135 - 145 mmol/L Final   Potassium 03/25/2024 3.2 (L)  3.5 - 5.1 mmol/L Final   Chloride 03/25/2024 105  98 - 111 mmol/L Final   BUN 03/25/2024 9  6 -  20 mg/dL Final   Creatinine, Ser 03/25/2024 0.70  0.44 - 1.00 mg/dL Final   Glucose, Bld 92/70/7974 132 (H)  70 - 99 mg/dL Final   Glucose reference range applies only to samples taken after fasting for at least 8 hours.   Calcium, Ion 03/25/2024 0.99 (L)  1.15 - 1.40 mmol/L Final   TCO2 03/25/2024 24  22 - 32 mmol/L Final   Hemoglobin 03/25/2024 12.9  12.0 - 15.0 g/dL Final   HCT 92/70/7974 38.0  36.0 - 46.0 % Final   Lactic Acid, Venous 03/25/2024 3.2 (HH)  0.5 - 1.9 mmol/L Final   Comment 03/25/2024 NOTIFIED PHYSICIAN   Final  Admission on 02/10/2024, Discharged on 02/12/2024  Component Date Value Ref Range Status   WBC 02/10/2024 16.8 (H)  4.0 - 10.5 K/uL Final   RBC 02/10/2024 4.46  3.87 - 5.11 MIL/uL Final   Hemoglobin 02/10/2024 12.7  12.0 - 15.0 g/dL Final   HCT 93/84/7974 39.5  36.0 - 46.0 % Final   MCV 02/10/2024 88.6  80.0 - 100.0 fL Final   MCH 02/10/2024 28.5  26.0 - 34.0 pg Final   MCHC 02/10/2024 32.2  30.0 - 36.0 g/dL Final   RDW 93/84/7974 14.8  11.5 - 15.5 % Final   Platelets 02/10/2024 331  150 - 400 K/uL Final   nRBC 02/10/2024 0.0  0.0 - 0.2 % Final    Neutrophils Relative % 02/10/2024 83  % Final   Neutro Abs 02/10/2024 14.0 (H)  1.7 - 7.7 K/uL Final   Lymphocytes Relative 02/10/2024 9  % Final   Lymphs Abs 02/10/2024 1.6  0.7 - 4.0 K/uL Final   Monocytes Relative 02/10/2024 6  % Final   Monocytes Absolute 02/10/2024 1.0  0.1 - 1.0 K/uL Final   Eosinophils Relative 02/10/2024 1  % Final   Eosinophils Absolute 02/10/2024 0.1  0.0 - 0.5 K/uL Final   Basophils Relative 02/10/2024 0  % Final   Basophils Absolute 02/10/2024 0.1  0.0 - 0.1 K/uL Final   Immature Granulocytes 02/10/2024 1  % Final   Abs Immature Granulocytes 02/10/2024 0.08 (H)  0.00 - 0.07 K/uL Final   Performed at [facility] Lab, [address] [place], [place] [address]   Sodium 02/10/2024 136  135 - 145 mmol/L Final   Potassium 02/10/2024 3.9  3.5 - 5.1 mmol/L Final   Chloride 02/10/2024 101  98 - 111 mmol/L Final   CO2 02/10/2024 25  22 - 32 mmol/L Final   Glucose, Bld 02/10/2024 94  70 - 99 mg/dL Final   Glucose reference range applies only to samples taken after fasting for at least 8 hours.   BUN 02/10/2024 7  6 - 20 mg/dL Final   Creatinine, Ser 02/10/2024 0.66  0.44 - 1.00 mg/dL Final   Calcium 93/84/7974 8.7 (L)  8.9 - 10.3 mg/dL Final   Total Protein 93/84/7974 6.8  6.5 - 8.1 g/dL Final   Albumin 93/84/7974 3.2 (L)  3.5 - 5.0 g/dL Final   AST 93/84/7974 215 (H)  15 - 41 U/L Final   ALT 02/10/2024 143 (H)  0 - 44 U/L Final   Alkaline Phosphatase 02/10/2024 82  38 - 126 U/L Final   Total Bilirubin 02/10/2024 1.0  0.0 - 1.2 mg/dL Final   GFR, Estimated 02/10/2024 >60  >60 mL/min Final   Comment: (NOTE) Calculated using the CKD-EPI Creatinine Equation (2021)    Anion gap 02/10/2024 10  5 - 15 Final  Performed at [facility] Lab, [address] [place], [place] [address]   Lipase 02/10/2024 25  11 - 51 U/L Final   Performed at [facility] Lab, [address], [place], [place] [address]   Specimen Source 02/10/2024 URINE, CLEAN CATCH   Final    Color, Urine 02/10/2024 YELLOW  YELLOW Final   APPearance 02/10/2024 CLOUDY (A)  CLEAR Final   Specific Gravity, Urine 02/10/2024 1.015  1.005 - 1.030 Final   pH 02/10/2024 7.0  5.0 - 8.0 Final   Glucose, UA 02/10/2024 NEGATIVE  NEGATIVE mg/dL Final   Hgb urine dipstick 02/10/2024 NEGATIVE  NEGATIVE Final   Bilirubin Urine 02/10/2024 NEGATIVE  NEGATIVE Final   Ketones, ur 02/10/2024 NEGATIVE  NEGATIVE mg/dL Final   Protein, ur 93/84/7974 30 (A)  NEGATIVE mg/dL Final   Nitrite 93/84/7974 POSITIVE (A)  NEGATIVE Final   Leukocytes,Ua 02/10/2024 MODERATE (A)  NEGATIVE Final   RBC / HPF 02/10/2024 0-5  0 - 5 RBC/hpf Final   WBC, UA 02/10/2024 21-50  0 - 5 WBC/hpf Final   Comment:        Reflex urine culture not performed if WBC <=10, OR if Squamous epithelial cells >5. If Squamous epithelial cells >5 suggest recollection.    Bacteria, UA 02/10/2024 FEW (A)  NONE SEEN Final   Squamous Epithelial / HPF 02/10/2024 0-5  0 - 5 /HPF Final   Mucus 02/10/2024 PRESENT   Final   Amorphous Crystal 02/10/2024 PRESENT   Final   Performed at [facility], [address] [place], [place] [address]   Preg Test, Ur 02/10/2024 NEGATIVE  NEGATIVE Final   Comment:        THE SENSITIVITY OF THIS METHODOLOGY IS >25 mIU/mL. Performed at [facility], [address], [place], [place] [address]    Specimen Description 02/10/2024 BLOOD RIGHT FOREARM   Final   Special Requests 02/10/2024 BOTTLES DRAWN AEROBIC AND ANAEROBIC Blood Culture adequate volume   Final   Culture 02/10/2024    Final                   Value:NO GROWTH 5 DAYS Performed at [facility], [address], [place], [place] [address]    Report Status 02/10/2024 02/15/2024 FINAL   Final   Specimen Description 02/10/2024 BLOOD LEFT ANTECUBITAL   Final   Special Requests 02/10/2024 BOTTLES DRAWN AEROBIC AND ANAEROBIC Blood Culture results may not be optimal due to an inadequate volume of blood received in culture bottles   Final    Culture 02/10/2024    Final                   Value:NO GROWTH 5 DAYS Performed at [facility], [address], [place], [place] [address]    Report Status 02/10/2024 02/15/2024 FINAL   Final   Neisseria Gonorrhea 02/10/2024 Positive (A)   Final   Chlamydia 02/10/2024 Negative   Final   Comment 02/10/2024 Normal Reference Ranger Chlamydia - Negative   Final   Comment 02/10/2024 Normal Reference Range Neisseria Gonorrhea - Negative   Final   Yeast Wet Prep HPF POC 02/10/2024 NONE SEEN  NONE SEEN Final   Trich, Wet Prep 02/10/2024 NONE SEEN  NONE SEEN Final   Clue Cells Wet Prep HPF POC 02/10/2024 NONE SEEN  NONE SEEN Final   WBC, Wet Prep HPF POC 02/10/2024 >=10 (A)  <10 Final   Sperm 02/10/2024 NONE SEEN  Final   Performed at [facility], [address] [place], [place] [address]   HIV Screen 4th Generation wRfx 02/10/2024 Non Reactive  Non Reactive Final   Performed at [facility], [address], [place], [place] [address]   RPR Ser Ql 02/10/2024 NON REACTIVE  NON REACTIVE Final   Performed at [facility], [address], [place], [place] [address]   Hepatitis B Surface Ag 02/10/2024 NON REACTIVE  NON REACTIVE Final   HCV Ab 02/10/2024 Reactive (A)  NON REACTIVE Final   Comment: (NOTE) The CDC recommends that a Reactive HCV antibody result be followed up  with a HCV Nucleic Acid Amplification test.     Hep A IgM 02/10/2024 NON REACTIVE  NON REACTIVE Final   Hep B C IgM 02/10/2024 NON REACTIVE  NON REACTIVE Final   Performed at [facility], [address], [place], [place] [address]   Specimen Description 02/10/2024 URINE, RANDOM   Final   Special Requests 02/10/2024    Final                   Value:NONE Reflexed from [id number] Performed at [facility], [address] [place], [place] [address]    Culture 02/10/2024 MULTIPLE SPECIES PRESENT, SUGGEST RECOLLECTION (A)   Final   Report Status 02/10/2024 02/11/2024 FINAL   Final   WBC  02/11/2024 17.9 (H)  4.0 - 10.5 K/uL Final   RBC 02/11/2024 4.32  3.87 - 5.11 MIL/uL Final   Hemoglobin 02/11/2024 12.4  12.0 - 15.0 g/dL Final   HCT 93/83/7974 38.1  36.0 - 46.0 % Final   MCV 02/11/2024 88.2  80.0 - 100.0 fL Final   MCH 02/11/2024 28.7  26.0 - 34.0 pg Final   MCHC 02/11/2024 32.5  30.0 - 36.0 g/dL Final   RDW 93/83/7974 14.9  11.5 - 15.5 % Final   Platelets 02/11/2024 328  150 - 400 K/uL Final   nRBC 02/11/2024 0.0  0.0 - 0.2 % Final   Performed at [facility], [address] [place], [place] [address]   Sodium 02/11/2024 134 (L)  135 - 145 mmol/L Final   Potassium 02/11/2024 4.1  3.5 - 5.1 mmol/L Final   Chloride 02/11/2024 103  98 - 111 mmol/L Final   CO2 02/11/2024 23  22 - 32 mmol/L Final   Glucose, Bld 02/11/2024 245 (H)  70 - 99 mg/dL Final   Glucose reference range applies only to samples taken after fasting for at least 8 hours.   BUN 02/11/2024 9  6 - 20 mg/dL Final   Creatinine, Ser 02/11/2024 0.76  0.44 - 1.00 mg/dL Final   Calcium 93/83/7974 7.9 (L)  8.9 - 10.3 mg/dL Final   Total Protein 93/83/7974 5.7 (L)  6.5 - 8.1 g/dL Final   Albumin 93/83/7974 2.3 (L)  3.5 - 5.0 g/dL Final   AST 93/83/7974 109 (H)  15 - 41 U/L Final   ALT 02/11/2024 95 (H)  0 - 44 U/L Final   Alkaline Phosphatase 02/11/2024 72  38 - 126 U/L Final   Total Bilirubin 02/11/2024 0.7  0.0 - 1.2 mg/dL Final   GFR, Estimated 02/11/2024 >60  >60 mL/min Final   Comment: (NOTE) Calculated using the CKD-EPI Creatinine Equation (2021)    Anion gap 02/11/2024 8  5 - 15 Final   Performed at [facility], [address], [place], [place] [address]  HCV RNA Qnt(log copy/mL) 02/11/2024 6.778  log10 IU/mL Corrected   HepC Qn 02/11/2024 6,000,000  IU/mL Final   Test Information 02/11/2024 Comment   Final   Comment: (NOTE) The quantitative range of this assay is 15 IU/mL to 100 million IU/mL.    Hcv Genotype 02/11/2024 Comment   Final   Comment: (NOTE) To be performed on this  specimen. Performed At: [facility] [address] [place], [place] [id number] Jennette Shorter MD Ey:[id number]    aPTT 02/11/2024 37 (H)  24 - 36 seconds Final   Comment:        IF BASELINE aPTT IS ELEVATED, SUGGEST PATIENT RISK ASSESSMENT BE USED TO DETERMINE APPROPRIATE ANTICOAGULANT THERAPY. Performed at [facility], [address] [place], [place] [address]    Prothrombin Time 02/11/2024 15.4 (H)  11.4 - 15.2 seconds Final   INR 02/11/2024 1.2  0.8 - 1.2 Final   Comment: (NOTE) INR goal varies based on device and disease states. Performed at [facility], [address], [place], [place] [address]    Fibrinogen  02/11/2024 524 (H)  210 - 475 mg/dL Final   Comment: (NOTE) Fibrinogen  results may be underestimated in patients receiving thrombolytic therapy. Performed at [facility], [address] [place], [place] [address]    TSH 02/11/2024 0.373  0.350 - 4.500 uIU/mL Final   Comment: Performed by a 3rd Generation assay with a functional sensitivity of <=0.01 uIU/mL. Performed at [facility], [address] [place], [place] [address]    WBC 02/12/2024 9.0  4.0 - 10.5 K/uL Final   RBC 02/12/2024 4.32  3.87 - 5.11 MIL/uL Final   Hemoglobin 02/12/2024 12.6  12.0 - 15.0 g/dL Final   HCT 93/82/7974 38.4  36.0 - 46.0 % Final   MCV 02/12/2024 88.9  80.0 - 100.0 fL Final   MCH 02/12/2024 29.2  26.0 - 34.0 pg Final   MCHC 02/12/2024 32.8  30.0 - 36.0 g/dL Final   RDW 93/82/7974 14.9  11.5 - 15.5 % Final   Platelets 02/12/2024 340  150 - 400 K/uL Final   nRBC 02/12/2024 0.0  0.0 - 0.2 % Final   Performed at [facility], [address] [place], [place] [address]   Sodium 02/12/2024 138  135 - 145 mmol/L Final   Potassium 02/12/2024 3.9  3.5 - 5.1 mmol/L Final   Chloride 02/12/2024 106  98 - 111 mmol/L Final   CO2 02/12/2024 22  22 - 32 mmol/L Final   Glucose, Bld 02/12/2024 147 (H)  70 - 99 mg/dL Final   Glucose reference range  applies only to samples taken after fasting for at least 8 hours.   BUN 02/12/2024 10  6 - 20 mg/dL Final   Creatinine, Ser 02/12/2024 0.66  0.44 - 1.00 mg/dL Final   Calcium 93/82/7974 8.2 (L)  8.9 - 10.3 mg/dL Final   Total Protein 93/82/7974 5.9 (L)  6.5 - 8.1 g/dL Final   Albumin 93/82/7974 2.2 (L)  3.5 - 5.0 g/dL Final   AST 93/82/7974 133 (H)  15 - 41 U/L Final   ALT 02/12/2024 93 (H)  0 - 44 U/L Final   Alkaline Phosphatase 02/12/2024 63  38 - 126 U/L Final   Total Bilirubin 02/12/2024 0.4  0.0 - 1.2 mg/dL Final   GFR, Estimated 02/12/2024 >60  >60 mL/min Final   Comment: (NOTE) Calculated using the CKD-EPI Creatinine Equation (2021)    Anion gap 02/12/2024  10  5 - 15 Final   Performed at [facility], [address], [place], [place] [address]   Hgb A1c MFr Bld 02/12/2024 5.4  4.8 - 5.6 % Final   Comment: (NOTE) Diagnosis of Diabetes The following HbA1c ranges recommended by the American Diabetes Association (ADA) may be used as an aid in the diagnosis of diabetes mellitus.  Hemoglobin             Suggested A1C NGSP%              Diagnosis  <5.7                   Non Diabetic  5.7-6.4                Pre-Diabetic  >6.4                   Diabetic  <7.0                   Glycemic control for                       adults with diabetes.     Mean Plasma Glucose 02/12/2024 108.28  mg/dL Final   Performed at [facility], [address], [place], [place] [address]   Hepatitis C Genotype 02/11/2024 1a   Final   Comment: (NOTE) This test was developed and its performance characteristics determined by [facility]. It has not been cleared or approved by the Food and Drug Administration. Performed At: [facility] [address] [place], [place] [id number] Jennette Shorter MD Ey:[id number]     Blood Alcohol level:  Lab Results  Component Value Date   [facility] <15 07/08/2024    Metabolic Disorder Labs: Lab Results  Component Value Date   HGBA1C 5.5  07/08/2024   MPG 111.15 07/08/2024   MPG 108.28 02/12/2024   No results found for: PROLACTIN Lab Results  Component Value Date   CHOL 174 07/08/2024   TRIG 102 07/08/2024   HDL 42 07/08/2024   CHOLHDL 4.1 07/08/2024   VLDL 20 07/08/2024   LDLCALC 112 (H) 07/08/2024   LDLCALC 73 09/22/2020    Therapeutic Lab Levels: No results found for: LITHIUM No results found for: VALPROATE No results found for: CBMZ  Physical Findings   GAD-7    Flowsheet Row Procedure visit from 11/01/2020 in [facility] [employer] [facility] at [facility] Office Visit from 09/22/2020 in [facility] [employer] at [facility] Clinical Support from 06/21/2020 in [facility] [employer] at [facility] Clinical Support from 04/05/2020 in [facility] for [employer] at [facility] Clinical Support from 01/19/2020 in [facility] for [employer] [facility] at [facility]  Total GAD-7 Score 7 2 10 8  0   PHQ2-9    Flowsheet Row ED from 07/10/2024 in [facility] ED from 07/08/2024 in [place] Procedure visit from 11/01/2020 in [facility] for [employer] [facility] at [facility] Office Visit from 09/22/2020 in [facility] for [employer] [facility] at [facility] Clinical Support from 06/21/2020 in [facility] for [employer] [facility] at [facility]  PHQ-2 Total Score 6 4 0 0 2  PHQ-9 Total Score 21 12 0 2 10   Flowsheet Row ED from 07/10/2024 in [facility] ED  from 07/08/2024 in [facility] ED from 05/17/2024 in [facility] Emergency Department at [facility]  C-SSRS RISK CATEGORY No Risk No Risk No Risk     Musculoskeletal  Strength & Muscle Tone: within normal limits Gait & Station: normal Patient leans: N/A  Psychiatric Specialty Exam  Presentation   General Appearance:  Disheveled  Eye Contact: Fair  Speech: Clear and Coherent  Speech Volume: Normal  Handedness: Right   Mood and Affect  Mood: Anxious; Depressed  Affect: Congruent   Thought Process  Thought Processes: Coherent  Descriptions of Associations:Intact  Orientation:Full (Time, Place and Person)  Thought Content:Logical  Diagnosis of Schizophrenia or Schizoaffective disorder in past: No    Hallucinations:Hallucinations: None  Ideas of Reference:None  Suicidal Thoughts:Suicidal Thoughts: No  Homicidal Thoughts:Homicidal Thoughts: No   Sensorium  Memory: Immediate Fair  Judgment: Fair  Insight: Fair   Art Therapist  Concentration: Fair  Attention Span: Poor  Recall: Fair  Fund of Knowledge: Fair  Language: Fair   Psychomotor Activity  Psychomotor Activity: Psychomotor Activity: Normal   Assets  Assets: Desire for Improvement; Resilience   Sleep  Sleep: Sleep: Good (improving)  Estimated Sleeping Duration (Last 24 Hours): 9.75-10.75 hours (Due to Daylight Saving Time, the durations displayed may not accurately represent documentation during the time change interval)  Nutritional Assessment (For OBS and [facility] admissions only) Has the patient had a weight loss or gain of 10 pounds or more in the last 3 months?: No Has the patient had a decrease in food intake/or appetite?: No Does the patient have dental problems?: No Does the patient have eating habits or behaviors that may be indicators of an eating disorder including binging or inducing vomiting?: No Has the patient recently lost weight without trying?: 0 Has the patient been eating poorly because of a decreased appetite?: 0 Malnutrition Screening Tool Score: 0    Physical Exam  Physical Exam ROS Blood pressure 124/66, pulse (!) 101, temperature 97.7 F (36.5 C), temperature source Oral, resp. rate 16, SpO2 100%. There is no height or weight on  file to calculate BMI.  Treatment Plan Summary: Daily contact with patient to assess and evaluate symptoms and progress in treatment and Medication management. Continue multimodal treatment in [facility]. Increase quetiapine to 100mg  qAM, 300mg  at bedtime to target mood lability, anxiety, insomnia. Increase buspirone to 20mg  BID for anxiety.  Psychoeducation on proper use of nicotine  patch and gum (even edentulous). Continue nonspecific nitrofurantoin treatment Day 5/7. Anticipate discharge to [facility] Relapse Prevention program on 11/20 followed by admission to [facility] Treatment on 12/1.   KANDI JAYSON HAHN, MD 07/16/2024 6:24 PM

## 2024-07-16 NOTE — Group Note (Signed)
 Group Topic: Positive Affirmations  Group Date: 07/16/2024 Start Time: 1300 End Time: 1326 Facilitators: Veverly Oddis BRAVO, NT  Department: [facility]  Number of Participants: 6  Group Focus: Positive Traits  Treatment Modality:  Cognitive Behavioral Therapy Interventions utilized were orientation Purpose: regain self-worth  Name: Morgan Donaldson Date of Birth: [birth year]  MR: [id number]    Level of Participation: active Quality of Participation: engaged Interactions with others: gave feedback Mood/Affect: appropriate Triggers (if applicable): none Cognition: coherent/clear Progress: Moderate Response: I need to work on being more decisive but I am cooperative, loyal and open-minded. Plan: patient will be encouraged to find ways to be more decisive and encouraged to keep attending group.  Patients Problems:  Patient Active Problem List   Diagnosis Date Noted   Opioid dependence with uncomplicated intoxication (HCC) 07/14/2024   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   Elevated liver function tests 07/14/2024   Opioid use with withdrawal (HCC) 07/14/2024   Polysubstance abuse (HCC) 07/10/2024   Pyosalpingitis 02/12/2024   Hepatitis C antibody detected 02/11/2024   PID (acute pelvic inflammatory disease) 02/10/2024   Gonorrhea 12/10/2023   Group A streptococcal infection 12/09/2023   IV drug abuse (HCC) 12/09/2023   Acute cystitis 12/09/2023   GAD (generalized anxiety disorder) 06/21/2020   Attention deficit hyperactivity disorder (ADHD), 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

## 2024-07-16 NOTE — ED Notes (Signed)
 Paitent provided lunch.

## 2024-07-16 NOTE — ED Notes (Signed)
 Paitent provided dinner.

## 2024-07-16 NOTE — ED Notes (Signed)

## 2024-07-16 NOTE — ED Notes (Signed)
 Patient resting with eyes closed in no apparent acute distress. Respirations even and unlabored. Environment secured. Safety checks in place according to facility policy.

## 2024-07-16 NOTE — ED Notes (Signed)
 Patient is in the Dayroom participating in GROUPS.  calm and composed. NAD.  Environment secured. . Will monitor for safety.

## 2024-07-16 NOTE — Group Note (Signed)
 Group Topic: Social Support  Group Date: 07/16/2024 Start Time: 1330 End Time: 1400 Facilitators: Kitrina Maurin, Zane HERO, RN  Department: [facility]  Number of Participants: 1  Group Focus: check in Treatment Modality:  Individual Therapy Interventions utilized were support Purpose: express feelings  Name: Morgan Donaldson Date of Birth: 02/21/1981  MR: [id number]    Level of Participation: active Quality of Participation: attentive and cooperative Interactions with others: gave feedback Mood/Affect: appropriate Triggers (if applicable): None identified at this time Cognition: coherent/clear and logical Progress: Gaining insight Response: Patient voiced no concerns regarding unit at this time. Patient discussed medication concerns, concerns given to provider to discuss with patient in more detail.  Plan: patient will be encouraged to continue to attend groups/programming on the unit  Patients Problems:  Patient Active Problem List   Diagnosis Date Noted   Opioid dependence with uncomplicated intoxication (HCC) 07/14/2024   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   Elevated liver function tests 07/14/2024   Opioid use with withdrawal (HCC) 07/14/2024   Polysubstance abuse (HCC) 07/10/2024   Pyosalpingitis 02/12/2024   Hepatitis C antibody detected 02/11/2024   PID (acute pelvic inflammatory disease) 02/10/2024   Gonorrhea 12/10/2023   Group A streptococcal infection 12/09/2023   IV drug abuse (HCC) 12/09/2023   Acute cystitis 12/09/2023   GAD (generalized anxiety disorder) 06/21/2020   Attention deficit hyperactivity disorder (ADHD), 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

## 2024-07-16 NOTE — Group Note (Signed)
 Group Topic: Relapse and Recovery  Group Date: 07/16/2024 Start Time: 2000 End Time: 2100 Facilitators: Joan Plowman B  Department: [facility]  Number of Participants: 3  Group Focus: abuse issues, community group, and diagnosis education Treatment Modality:  Exposure Therapy, Patient-Centered Therapy, and Spiritual Interventions utilized were leisure development and support Purpose: enhance coping skills, relapse prevention strategies, and trigger / craving management  Name: Morgan Donaldson Date of Birth: Dec 27, 1980  MR: [id number]    Level of Participation: active Quality of Participation: attentive Interactions with others: gave feedback Mood/Affect: appropriate Triggers (if applicable): NA Cognition: coherent/clear Progress: Gaining insight Response: NA Plan: patient will be encouraged to keep going to groups  Patients Problems:  Patient Active Problem List   Diagnosis Date Noted   Hyperthyroidism 07/16/2024   Opioid dependence with uncomplicated intoxication (HCC) 07/14/2024   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   Elevated liver function tests 07/14/2024   Opioid use with withdrawal (HCC) 07/14/2024   Polysubstance abuse (HCC) 07/10/2024   Pyosalpingitis 02/12/2024   Hepatitis C antibody detected 02/11/2024   PID (acute pelvic inflammatory disease) 02/10/2024   Gonorrhea 12/10/2023   Group A streptococcal infection 12/09/2023   IV drug abuse (HCC) 12/09/2023   Acute cystitis 12/09/2023   GAD (generalized anxiety disorder) 06/21/2020   Attention deficit hyperactivity disorder (ADHD), 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

## 2024-07-16 NOTE — ED Notes (Signed)
 Pt asleep at the moment. NAD, Will keep monitoring for safety.

## 2024-07-17 DIAGNOSIS — F411 Generalized anxiety disorder: Secondary | ICD-10-CM | POA: Diagnosis not present

## 2024-07-17 DIAGNOSIS — F1420 Cocaine dependence, uncomplicated: Secondary | ICD-10-CM | POA: Diagnosis not present

## 2024-07-17 DIAGNOSIS — F11220 Opioid dependence with intoxication, uncomplicated: Secondary | ICD-10-CM | POA: Diagnosis not present

## 2024-07-17 DIAGNOSIS — F1924 Other psychoactive substance dependence with psychoactive substance-induced mood disorder: Secondary | ICD-10-CM | POA: Diagnosis not present

## 2024-07-17 NOTE — ED Notes (Signed)
 Pt expressed concern of relapse, expressed readiness of discharge to rehab. Pt says she feels 'alright' , but reports avh of deceased mother- occasionally seeing her and hearing her call pts name. No occurences at time or RN interaction with pt . Pt denies si hi- verbal contract for safety provided. Pt ate breakfast. Pt reports stomach pain thought to be caused by constipation, last BM two days ago. Prn MOM administered.

## 2024-07-17 NOTE — ED Notes (Signed)
 Pt currently resting in room. Discussion r/t pts beverage preferences discussed with supervisor as requested. No other concerns or complaints reported. No distress observed.

## 2024-07-17 NOTE — Care Management (Signed)
 [facility] Care Management  Peer Support Specialist is meeting with the patient for possible intake into the transitional living program.  Patient has an expected date of admission to [facility] on 07/28/24.  If she is accepted into the transitional program then she can stay there until her admission date of 07/28/24 to [facility].

## 2024-07-17 NOTE — Group Note (Signed)
 Group Topic: Understanding Self  Group Date: 07/17/2024 Start Time: 1200 End Time: 1230 Facilitators: Daved Tinnie HERO, RN  Department: [facility]  Number of Participants: 8  Group Focus: nursing group Treatment Modality:  Psychoeducation Interventions utilized were exploration Purpose: increase insight  Name: Morgan Donaldson Date of Birth: 12/12/1980  MR: [id number]    Level of Participation: when cued Quality of Participation: attentive and cooperative Interactions with others: gave feedback Mood/Affect: appropriate Triggers (if applicable): n/a Cognition: insightful Progress: Gaining insight Response: pt says they will go to meetings to help maintain and strengthen their values Plan: patient will be encouraged to attend future RN groups   Patients Problems:  Patient Active Problem List   Diagnosis Date Noted   Hyperthyroidism 07/16/2024   Opioid dependence with uncomplicated intoxication (HCC) 07/14/2024   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   Elevated liver function tests 07/14/2024   Opioid use with withdrawal (HCC) 07/14/2024   Polysubstance abuse (HCC) 07/10/2024   Pyosalpingitis 02/12/2024   Hepatitis C antibody detected 02/11/2024   PID (acute pelvic inflammatory disease) 02/10/2024   Gonorrhea 12/10/2023   Group A streptococcal infection 12/09/2023   IV drug abuse (HCC) 12/09/2023   Acute cystitis 12/09/2023   GAD (generalized anxiety disorder) 06/21/2020   Attention deficit hyperactivity disorder (ADHD), 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

## 2024-07-17 NOTE — Group Note (Signed)
 Group Topic: Decisional Balance/Substance Abuse  Group Date: 07/17/2024 Start Time: 1515 End Time: 1600 Facilitators: Nicholaus Arlyne BIRCH, NT; Elnor Keven SAILOR  Department: [facility]  Number of Participants: 8  Group Focus: chemical dependency education Treatment Modality:  Psychoeducation Interventions utilized were support Purpose: relapse prevention strategies  Name: Morgan Donaldson Date of Birth: [birth year]  MR: [id number]    Level of Participation: minimal Quality of Participation: attentive Interactions with others: listened and gave feedback Mood/Affect: appropriate Triggers (if applicable): NA Cognition: coherent/clear Progress: Minimal Response: Read through NA Am I an Addict questions and discussed experience of being an addict. Listened to testimony of recovery and supported and encouraged by staff to be successful and confident in recovery. Pt shared about her specific experience with addiction.  Plan: follow-up needed  Patients Problems:  Patient Active Problem List   Diagnosis Date Noted   Hyperthyroidism 07/16/2024   Opioid dependence with uncomplicated intoxication (HCC) 07/14/2024   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   Elevated liver function tests 07/14/2024   Opioid use with withdrawal (HCC) 07/14/2024   Polysubstance abuse (HCC) 07/10/2024   Pyosalpingitis 02/12/2024   Hepatitis C antibody detected 02/11/2024   PID (acute pelvic inflammatory disease) 02/10/2024   Gonorrhea 12/10/2023   Group A streptococcal infection 12/09/2023   IV drug abuse (HCC) 12/09/2023   Acute cystitis 12/09/2023   GAD (generalized anxiety disorder) 06/21/2020   Attention deficit hyperactivity disorder (ADHD), combined type 06/21/2020   Breakthrough bleeding on depo provera  05/12/2020   Pap smear abnormality of cervix/human papillomavirus (HPV) positive 08/17/2019   Hemorrhagic cyst of left ovary 08/11/2019   Tobacco abuse 05/19/2019    History of drug abuse in remission (HCC) 05/19/2019

## 2024-07-17 NOTE — ED Notes (Signed)
 Patient is in the bedroom calm and sleeping. NAD. Environment secured per policy. Will continue to monitor for safety.

## 2024-07-17 NOTE — ED Notes (Addendum)
 COWS not completed at this time, pt asleep. Will continue to monitor for safety.

## 2024-07-17 NOTE — ED Provider Notes (Signed)
 Behavioral Health Progress Note  Date and Time: 07/17/2024 5:27 PM Name: Morgan Donaldson MRN:  [id number]  Subjective:  Patient eager for planned discharge today. Sleep was good. Mood is alright. Food is terrible. Feel better with increased quetiapine  and buspirone . Only real concern is about getting medications filled at the new program after discharge. She has not tried alternative approach to nicotine  gum.  Diagnosis:  Final diagnoses:  Polysubstance abuse (HCC)  Opioid dependence with uncomplicated intoxication (HCC)  Drug-induced mood disorder (HCC)  Elevated liver function tests  Opioid use with withdrawal (HCC)  Hyperthyroidism  Generalized anxiety disorder   Total Time spent with patient: I personally spent 20 minutes on the unit in direct patient care. The direct patient care time included face-to-face time with the patient, reviewing the patient's chart, communicating with other professionals, and coordinating care. Greater than 50% of this time was spent in counseling or coordinating care with the patient regarding goals of hospitalization, psycho-education, and discharge planning needs.   On my assessment the patient denied SI, HI, AVH, paranoia, ideas of reference, or first rank symptoms. Patient denied drug cravings (except nicotine ) or active signs of withdrawal. Patient denied medication side-effects. Patient was not deemed to be a danger to self or others.     Provider Admission HPI: Morgan Donaldson is  a 43 year old female who presented  to [facility] with a history of Opioid Use Disorder, severe and Stimulant Use Disorder, cocaine type, severe along with untreated Bipolar Disorder. Pt was transferred to Facility Based Crisis unit for detox from Fentanyl  and crack cocaine. UDS positive for cocaine, morphine  and THC. BAL upon admission is negative.    Patient reported she has been using 1/2 gram of fentanyl  IV for the past year.  She has also been smoking approximately 1/2 gram of  crack cocaine daily for the past year.  Patient stated she is done with using, stating she had this realization when she [place] a bridge this morning.  Patient has been homeless for the past year.  She reported  she has a boyfriend, and things are going well outside of patient's substance use issues.  Patient stated her boyfriend is supportive of her seeking treatment and will allow her to stay with him once she is clean and has completed treatment.  Patient reported  she is also estranged from her sister and other family members due to her ongoing issues with substance use.  She is hopeful that after treatment she can reconnect with family.   Sleep: Fair  Appetite:  Fair  Current Medications:  Current Facility-Administered Medications  Medication Dose Route Frequency Provider Last Rate Last Admin   acetaminophen  (TYLENOL ) tablet 650 mg  650 mg Oral Q6H PRN Randall Starlyn HERO, NP   650 mg at 07/13/24 0836   alum & mag hydroxide-simeth (MAALOX/MYLANTA) 200-200-20 MG/5ML suspension 30 mL  30 mL Oral Q4H PRN Onuoha, Chinwendu V, NP   30 mL at 07/15/24 2106   busPIRone  (BUSPAR ) tablet 20 mg  20 mg Oral BID Tari Lecount C, MD   20 mg at 07/17/24 1512   haloperidol  (HALDOL ) tablet 5 mg  5 mg Oral TID PRN Randall Starlyn HERO, NP   5 mg at 07/15/24 1358   And   diphenhydrAMINE  (BENADRYL ) capsule 50 mg  50 mg Oral TID PRN Randall Starlyn HERO, NP   50 mg at 07/15/24 1358   haloperidol  lactate (HALDOL ) injection 5 mg  5 mg Intramuscular TID PRN Randall Starlyn  M, NP       And   diphenhydrAMINE  (BENADRYL ) injection 50 mg  50 mg Intramuscular TID PRN Randall Starlyn HERO, NP       And   LORazepam  (ATIVAN ) injection 2 mg  2 mg Intramuscular TID PRN Randall Starlyn HERO, NP       haloperidol  lactate (HALDOL ) injection 10 mg  10 mg Intramuscular TID PRN Randall Starlyn HERO, NP       And   diphenhydrAMINE  (BENADRYL ) injection 50 mg  50 mg Intramuscular TID PRN Randall Starlyn HERO, NP       And   LORazepam  (ATIVAN ) injection 2 mg  2 mg Intramuscular TID PRN Randall, Veronique M, NP       feeding supplement (ENSURE PLUS HIGH PROTEIN) liquid 237 mL  237 mL Oral TID BM Gottfried, Rhoda J, MD   237 mL at 07/17/24 1358   magnesium  hydroxide (MILK OF MAGNESIA) suspension 30 mL  30 mL Oral Daily PRN Randall Starlyn HERO, NP   30 mL at 07/17/24 9081   nicotine  (NICODERM CQ  - dosed in mg/24 hours) patch 21 mg  21 mg Transdermal Daily Randall, Veronique M, NP   21 mg at 07/17/24 0915   nicotine  polacrilex (NICORETTE) gum 2 mg  2 mg Oral PRN Gottfried, Rhoda J, MD   2 mg at 07/15/24 1813   nitrofurantoin (macrocrystal-monohydrate) (MACROBID) capsule 100 mg  100 mg Oral Q12H Lewis, Tanika N, NP   100 mg at 07/17/24 0917   QUEtiapine (SEROQUEL) tablet 100 mg  100 mg Oral Landrum Hahn, Berlyn Saylor C, MD   100 mg at 07/17/24 0630   QUEtiapine (SEROQUEL) tablet 300 mg  300 mg Oral QHS Aishah Teffeteller C, MD   300 mg at 07/16/24 2101   traZODone  (DESYREL ) tablet 100 mg  100 mg Oral QHS PRN Byungura, Veronique M, NP   100 mg at 07/16/24 2101   No current outpatient medications on file.    Labs  Lab Results:  Admission on 07/10/2024  Component Date Value Ref Range Status   T3, Free 07/11/2024 2.2  2.0 - 4.4 pg/mL Final   Comment: (NOTE) Performed At: [facility] [address] [place], [place] [id number] Jennette Shorter MD Ey:[id number]    Free T4 07/11/2024 0.71  0.61 - 1.12 ng/dL Final   Comment: (NOTE) Biotin ingestion may interfere with free T4 tests. If the results are inconsistent with the TSH level, previous test results, or the clinical presentation, then consider biotin interference. If needed, order repeat testing after stopping biotin. Performed at [facility] Lab, [address], [place], [place] [address]    Neisseria Gonorrhea 07/12/2024 Negative   Final   Chlamydia 07/12/2024 Negative   Final   Comment 07/12/2024 Normal Reference Ranger Chlamydia  - Negative   Final   Comment 07/12/2024 Normal Reference Range Neisseria Gonorrhea - Negative   Final   Total Protein 07/12/2024 7.1  6.5 - 8.1 g/dL Final   Albumin 88/84/7974 3.3 (L)  3.5 - 5.0 g/dL Final   AST 88/84/7974 67 (H)  15 - 41 U/L Final   ALT 07/12/2024 95 (H)  0 - 44 U/L Final   Alkaline Phosphatase 07/12/2024 110  38 - 126 U/L Final   Total Bilirubin 07/12/2024 0.6  0.0 - 1.2 mg/dL Final   Bilirubin, Direct 07/12/2024 <0.1  0.0 - 0.2 mg/dL Final   Indirect Bilirubin 07/12/2024 NOT CALCULATED  0.3 - 0.9 mg/dL Final   Performed at [facility]  Lab, [address] [place], [place] [address]   RPR Ser Ql 07/12/2024 NON REACTIVE  NON REACTIVE Final   Performed at [facility] Lab, [address], [place], [place] [address]   [facility] test code 07/12/2024 [id number]   Final   [facility] test name 07/12/2024 [id number]   Final   Source ([facility]) 07/12/2024 SERUM   Final   Performed at [facility] Lab, [address] [place], [place] [address]   Misc [facility] result 07/12/2024 COMMENT   Final   Comment: (NOTE) Test Ordered: [id number] HIV Ab/p24 Ag with Reflex HIV Ab/p24 Ag Screen           Note:                     BN     Non Reactive                                                  Reference Range: Non Reactive                          HIV-1/HIV-2 antibodies and HIV-1 p24 antigen were NOT detected. There is no laboratory evidence of HIV infection. HIV Negative Performed At: [facility] [address] [place], [place] [id number] Jennette Shorter MD Ey:[id number]   Admission on 07/08/2024, Discharged on 07/10/2024  Component Date Value Ref Range Status   WBC 07/08/2024 10.6 (H)  4.0 - 10.5 K/uL Final   RBC 07/08/2024 5.65 (H)  3.87 - 5.11 MIL/uL Final   Hemoglobin 07/08/2024 16.5 (H)  12.0 - 15.0 g/dL Final   HCT 88/88/7974 49.4 (H)  36.0 - 46.0 % Final   MCV 07/08/2024 87.4  80.0 - 100.0 fL Final   MCH 07/08/2024 29.2  26.0 - 34.0 pg Final   MCHC 07/08/2024 33.4  30.0 -  36.0 g/dL Final   RDW 88/88/7974 13.7  11.5 - 15.5 % Final   Platelets 07/08/2024 422 (H)  150 - 400 K/uL Final   nRBC 07/08/2024 0.0  0.0 - 0.2 % Final   Neutrophils Relative % 07/08/2024 75  % Final   Neutro Abs 07/08/2024 7.9 (H)  1.7 - 7.7 K/uL Final   Lymphocytes Relative 07/08/2024 17  % Final   Lymphs Abs 07/08/2024 1.8  0.7 - 4.0 K/uL Final   Monocytes Relative 07/08/2024 7  % Final   Monocytes Absolute 07/08/2024 0.7  0.1 - 1.0 K/uL Final   Eosinophils Relative 07/08/2024 0  % Final   Eosinophils Absolute 07/08/2024 0.0  0.0 - 0.5 K/uL Final   Basophils Relative 07/08/2024 1  % Final   Basophils Absolute 07/08/2024 0.1  0.0 - 0.1 K/uL Final   Immature Granulocytes 07/08/2024 0  % Final   Abs Immature Granulocytes 07/08/2024 0.04  0.00 - 0.07 K/uL Final   Performed at [facility] Lab, [address] [place], [place] [address]   Sodium 07/08/2024 140  135 - 145 mmol/L Final   Potassium 07/08/2024 4.1  3.5 - 5.1 mmol/L Final   Chloride 07/08/2024 100  98 - 111 mmol/L Final   CO2 07/08/2024 27  22 - 32 mmol/L Final   Glucose, Bld 07/08/2024 113 (H)  70 - 99 mg/dL Final   Glucose reference range applies only to samples taken after fasting for  at least 8 hours.   BUN 07/08/2024 10  6 - 20 mg/dL Final   Creatinine, Ser 07/08/2024 0.74  0.44 - 1.00 mg/dL Final   Calcium 88/88/7974 9.9  8.9 - 10.3 mg/dL Final   Total Protein 88/88/7974 7.9  6.5 - 8.1 g/dL Final   Albumin 88/88/7974 3.5  3.5 - 5.0 g/dL Final   AST 88/88/7974 109 (H)  15 - 41 U/L Final   ALT 07/08/2024 132 (H)  0 - 44 U/L Final   Alkaline Phosphatase 07/08/2024 74  38 - 126 U/L Final   Total Bilirubin 07/08/2024 0.3  0.0 - 1.2 mg/dL Final   GFR, Estimated 07/08/2024 >60  >60 mL/min Final   Comment: (NOTE) Calculated using the CKD-EPI Creatinine Equation (2021)    Anion gap 07/08/2024 13  5 - 15 Final   Performed at [facility] Lab, [address] [place], [place] [address]   Hgb A1c MFr Bld 07/08/2024 5.5   4.8 - 5.6 % Final   Comment: (NOTE) Diagnosis of Diabetes The following HbA1c ranges recommended by the American Diabetes Association (ADA) may be used as an aid in the diagnosis of diabetes mellitus.  Hemoglobin             Suggested A1C NGSP%              Diagnosis  <5.7                   Non Diabetic  5.7-6.4                Pre-Diabetic  >6.4                   Diabetic  <7.0                   Glycemic control for                       adults with diabetes.     Mean Plasma Glucose 07/08/2024 111.15  mg/dL Final   Performed at [facility] Lab, [address], [place], [place] [address]   Magnesium  07/08/2024 2.1  1.7 - 2.4 mg/dL Final   Performed at [facility] Lab, [address] [place], [place] [address]   Alcohol, Ethyl (B) 07/08/2024 <15  <15 mg/dL Final   Comment: (NOTE) For medical purposes only. Performed at [facility], [address] [place], [place] [address]    Cholesterol 07/08/2024 174  0 - 200 mg/dL Final   Triglycerides 88/88/7974 102  <150 mg/dL Final   HDL 88/88/7974 42  >40 mg/dL Final   Total CHOL/HDL Ratio 07/08/2024 4.1  RATIO Final   VLDL 07/08/2024 20  0 - 40 mg/dL Final   LDL Cholesterol 07/08/2024 112 (H)  0 - 99 mg/dL Final   Comment:        Total Cholesterol/HDL:CHD Risk Coronary Heart Disease Risk Table                     Men   Women  1/2 Average Risk   3.4   3.3  Average Risk       5.0   4.4  2 X Average Risk   9.6   7.1  3 X Average Risk  23.4   11.0        Use the calculated Patient Ratio above and the CHD Risk Table to determine the patient's CHD  Risk.        ATP III CLASSIFICATION (LDL):  <100     mg/dL   Optimal  899-870  mg/dL   Near or Above                    Optimal  130-159  mg/dL   Borderline  839-810  mg/dL   High  >809     mg/dL   Very High Performed at [facility], [address], [place], [place] [address]    Color, Urine 07/09/2024 YELLOW  YELLOW Final   APPearance 07/09/2024 TURBID (A)   CLEAR Final   Specific Gravity, Urine 07/09/2024 1.017  1.005 - 1.030 Final   pH 07/09/2024 7.0  5.0 - 8.0 Final   Glucose, UA 07/09/2024 NEGATIVE  NEGATIVE mg/dL Final   Hgb urine dipstick 07/09/2024 NEGATIVE  NEGATIVE Final   Bilirubin Urine 07/09/2024 NEGATIVE  NEGATIVE Final   Ketones, ur 07/09/2024 NEGATIVE  NEGATIVE mg/dL Final   Protein, ur 88/87/7974 NEGATIVE  NEGATIVE mg/dL Final   Nitrite 88/87/7974 NEGATIVE  NEGATIVE Final   Leukocytes,Ua 07/09/2024 MODERATE (A)  NEGATIVE Final   RBC / HPF 07/09/2024 0-5  0 - 5 RBC/hpf Final   WBC, UA 07/09/2024 21-50  0 - 5 WBC/hpf Final   Bacteria, UA 07/09/2024 MANY (A)  NONE SEEN Final   Squamous Epithelial / HPF 07/09/2024 0-5  0 - 5 /HPF Final   WBC Clumps 07/09/2024 PRESENT   Final   Performed at [facility] Lab, [address] [place], [place] [address]   Preg Test, Ur 07/09/2024 Negative  Negative Final   POC Amphetamine  UR 07/09/2024 None Detected  NONE DETECTED (Cut Off Level 1000 ng/mL) Final   POC Secobarbital (BAR) 07/09/2024 None Detected  NONE DETECTED (Cut Off Level 300 ng/mL) Final   POC Buprenorphine (BUP) 07/09/2024 None Detected  NONE DETECTED (Cut Off Level 10 ng/mL) Final   POC Oxazepam (BZO) 07/09/2024 None Detected  NONE DETECTED (Cut Off Level 300 ng/mL) Final   POC Cocaine UR 07/09/2024 Positive (A)  NONE DETECTED (Cut Off Level 300 ng/mL) Final   POC Methamphetamine UR 07/09/2024 None Detected  NONE DETECTED (Cut Off Level 1000 ng/mL) Final   POC Morphine  07/09/2024 Positive (A)  NONE DETECTED (Cut Off Level 300 ng/mL) Final   POC Methadone UR 07/09/2024 None Detected  NONE DETECTED (Cut Off Level 300 ng/mL) Final   POC Oxycodone  UR 07/09/2024 None Detected  NONE DETECTED (Cut Off Level 100 ng/mL) Final   POC Marijuana UR 07/09/2024 Positive (A)  NONE DETECTED (Cut Off Level 50 ng/mL) Final   TSH 07/08/2024 0.112 (L)  0.350 - 4.500 uIU/mL Final   Comment: Performed by a 3rd Generation assay with a functional  sensitivity of <=0.01 uIU/mL. Performed at [facility] Lab, [address], [place], [place] [address]    Preg Test, Ur 07/09/2024 NEGATIVE  NEGATIVE Final   Comment:        THE SENSITIVITY OF THIS METHODOLOGY IS >20 mIU/mL.   Admission on 05/17/2024, Discharged on 05/18/2024  Component Date Value Ref Range Status   WBC 05/17/2024 9.0  4.0 - 10.5 K/uL Final   RBC 05/17/2024 4.35  3.87 - 5.11 MIL/uL Final   Hemoglobin 05/17/2024 12.8  12.0 - 15.0 g/dL Final   HCT 90/79/7974 39.7  36.0 - 46.0 % Final   MCV 05/17/2024 91.3  80.0 - 100.0 fL Final   MCH 05/17/2024 29.4  26.0 - 34.0  pg Final   MCHC 05/17/2024 32.2  30.0 - 36.0 g/dL Final   RDW 90/79/7974 14.1  11.5 - 15.5 % Final   Platelets 05/17/2024 286  150 - 400 K/uL Final   nRBC 05/17/2024 0.0  0.0 - 0.2 % Final   Neutrophils Relative % 05/17/2024 65  % Final   Neutro Abs 05/17/2024 5.8  1.7 - 7.7 K/uL Final   Lymphocytes Relative 05/17/2024 23  % Final   Lymphs Abs 05/17/2024 2.1  0.7 - 4.0 K/uL Final   Monocytes Relative 05/17/2024 8  % Final   Monocytes Absolute 05/17/2024 0.8  0.1 - 1.0 K/uL Final   Eosinophils Relative 05/17/2024 4  % Final   Eosinophils Absolute 05/17/2024 0.3  0.0 - 0.5 K/uL Final   Basophils Relative 05/17/2024 0  % Final   Basophils Absolute 05/17/2024 0.0  0.0 - 0.1 K/uL Final   Immature Granulocytes 05/17/2024 0  % Final   Abs Immature Granulocytes 05/17/2024 0.04  0.00 - 0.07 K/uL Final   Performed at [facility], [address] [place], [place] [address]   Preg, Serum 05/17/2024 NEGATIVE  NEGATIVE Final   Comment:        THE SENSITIVITY OF THIS METHODOLOGY IS >10 mIU/mL. Performed at [facility], [address] [place], [place] [address]    Sodium 05/17/2024 141  135 - 145 mmol/L Final   Potassium 05/17/2024 3.5  3.5 - 5.1 mmol/L Final   Chloride 05/17/2024 104  98 - 111 mmol/L Final   CO2 05/17/2024 26  22 - 32 mmol/L Final   Glucose, Bld 05/17/2024 118 (H)  70 - 99 mg/dL  Final   Glucose reference range applies only to samples taken after fasting for at least 8 hours.   BUN 05/17/2024 11  6 - 20 mg/dL Final   Creatinine, Ser 05/17/2024 0.78  0.44 - 1.00 mg/dL Final   Calcium 90/79/7974 8.8 (L)  8.9 - 10.3 mg/dL Final   Total Protein 90/79/7974 7.0  6.5 - 8.1 g/dL Final   Albumin 90/79/7974 3.0 (L)  3.5 - 5.0 g/dL Final   AST 90/79/7974 115 (H)  15 - 41 U/L Final   ALT 05/17/2024 144 (H)  0 - 44 U/L Final   Alkaline Phosphatase 05/17/2024 71  38 - 126 U/L Final   Total Bilirubin 05/17/2024 0.5  0.0 - 1.2 mg/dL Final   GFR, Estimated 05/17/2024 >60  >60 mL/min Final   Comment: (NOTE) Calculated using the CKD-EPI Creatinine Equation (2021)    Anion gap 05/17/2024 11  5 - 15 Final   Performed at [facility] Lab, [address] [place], [place] [address]   Sodium 05/17/2024 142  135 - 145 mmol/L Final   Potassium 05/17/2024 3.5  3.5 - 5.1 mmol/L Final   Chloride 05/17/2024 103  98 - 111 mmol/L Final   BUN 05/17/2024 12  6 - 20 mg/dL Final   Creatinine, Ser 05/17/2024 0.70  0.44 - 1.00 mg/dL Final   Glucose, Bld 90/79/7974 120 (H)  70 - 99 mg/dL Final   Glucose reference range applies only to samples taken after fasting for at least 8 hours.   Calcium, Ion 05/17/2024 1.13 (L)  1.15 - 1.40 mmol/L Final   TCO2 05/17/2024 28  22 - 32 mmol/L Final   Hemoglobin 05/17/2024 11.6 (L)  12.0 - 15.0 g/dL Final   HCT 90/79/7974 34.0 (L)  36.0 - 46.0 % Final  Admission on 03/25/2024, Discharged on  03/25/2024  Component Date Value Ref Range Status   Sodium 03/25/2024 140  135 - 145 mmol/L Final   Potassium 03/25/2024 3.2 (L)  3.5 - 5.1 mmol/L Final   Chloride 03/25/2024 105  98 - 111 mmol/L Final   BUN 03/25/2024 9  6 - 20 mg/dL Final   Creatinine, Ser 03/25/2024 0.70  0.44 - 1.00 mg/dL Final   Glucose, Bld 92/70/7974 132 (H)  70 - 99 mg/dL Final   Glucose reference range applies only to samples taken after fasting for at least 8 hours.   Calcium, Ion 03/25/2024  0.99 (L)  1.15 - 1.40 mmol/L Final   TCO2 03/25/2024 24  22 - 32 mmol/L Final   Hemoglobin 03/25/2024 12.9  12.0 - 15.0 g/dL Final   HCT 92/70/7974 38.0  36.0 - 46.0 % Final   Lactic Acid, Venous 03/25/2024 3.2 (HH)  0.5 - 1.9 mmol/L Final   Comment 03/25/2024 NOTIFIED PHYSICIAN   Final  Admission on 02/10/2024, Discharged on 02/12/2024  Component Date Value Ref Range Status   WBC 02/10/2024 16.8 (H)  4.0 - 10.5 K/uL Final   RBC 02/10/2024 4.46  3.87 - 5.11 MIL/uL Final   Hemoglobin 02/10/2024 12.7  12.0 - 15.0 g/dL Final   HCT 93/84/7974 39.5  36.0 - 46.0 % Final   MCV 02/10/2024 88.6  80.0 - 100.0 fL Final   MCH 02/10/2024 28.5  26.0 - 34.0 pg Final   MCHC 02/10/2024 32.2  30.0 - 36.0 g/dL Final   RDW 93/84/7974 14.8  11.5 - 15.5 % Final   Platelets 02/10/2024 331  150 - 400 K/uL Final   nRBC 02/10/2024 0.0  0.0 - 0.2 % Final   Neutrophils Relative % 02/10/2024 83  % Final   Neutro Abs 02/10/2024 14.0 (H)  1.7 - 7.7 K/uL Final   Lymphocytes Relative 02/10/2024 9  % Final   Lymphs Abs 02/10/2024 1.6  0.7 - 4.0 K/uL Final   Monocytes Relative 02/10/2024 6  % Final   Monocytes Absolute 02/10/2024 1.0  0.1 - 1.0 K/uL Final   Eosinophils Relative 02/10/2024 1  % Final   Eosinophils Absolute 02/10/2024 0.1  0.0 - 0.5 K/uL Final   Basophils Relative 02/10/2024 0  % Final   Basophils Absolute 02/10/2024 0.1  0.0 - 0.1 K/uL Final   Immature Granulocytes 02/10/2024 1  % Final   Abs Immature Granulocytes 02/10/2024 0.08 (H)  0.00 - 0.07 K/uL Final   Performed at [facility] Lab, [address], [place], [place] [address]   Sodium 02/10/2024 136  135 - 145 mmol/L Final   Potassium 02/10/2024 3.9  3.5 - 5.1 mmol/L Final   Chloride 02/10/2024 101  98 - 111 mmol/L Final   CO2 02/10/2024 25  22 - 32 mmol/L Final   Glucose, Bld 02/10/2024 94  70 - 99 mg/dL Final   Glucose reference range applies only to samples taken after fasting for at least 8 hours.   BUN 02/10/2024 7  6 - 20 mg/dL Final    Creatinine, Ser 02/10/2024 0.66  0.44 - 1.00 mg/dL Final   Calcium 93/84/7974 8.7 (L)  8.9 - 10.3 mg/dL Final   Total Protein 93/84/7974 6.8  6.5 - 8.1 g/dL Final   Albumin 93/84/7974 3.2 (L)  3.5 - 5.0 g/dL Final   AST 93/84/7974 215 (H)  15 - 41 U/L Final   ALT 02/10/2024 143 (H)  0 - 44 U/L Final   Alkaline Phosphatase 02/10/2024 82  38 -  126 U/L Final   Total Bilirubin 02/10/2024 1.0  0.0 - 1.2 mg/dL Final   GFR, Estimated 02/10/2024 >60  >60 mL/min Final   Comment: (NOTE) Calculated using the CKD-EPI Creatinine Equation (2021)    Anion gap 02/10/2024 10  5 - 15 Final   Performed at [facility] Lab, [address], [place], [place] [address]   Lipase 02/10/2024 25  11 - 51 U/L Final   Performed at [facility], [address], [place], [place] [address]   Specimen Source 02/10/2024 URINE, CLEAN CATCH   Final   Color, Urine 02/10/2024 YELLOW  YELLOW Final   APPearance 02/10/2024 CLOUDY (A)  CLEAR Final   Specific Gravity, Urine 02/10/2024 1.015  1.005 - 1.030 Final   pH 02/10/2024 7.0  5.0 - 8.0 Final   Glucose, UA 02/10/2024 NEGATIVE  NEGATIVE mg/dL Final   Hgb urine dipstick 02/10/2024 NEGATIVE  NEGATIVE Final   Bilirubin Urine 02/10/2024 NEGATIVE  NEGATIVE Final   Ketones, ur 02/10/2024 NEGATIVE  NEGATIVE mg/dL Final   Protein, ur 93/84/7974 30 (A)  NEGATIVE mg/dL Final   Nitrite 93/84/7974 POSITIVE (A)  NEGATIVE Final   Leukocytes,Ua 02/10/2024 MODERATE (A)  NEGATIVE Final   RBC / HPF 02/10/2024 0-5  0 - 5 RBC/hpf Final   WBC, UA 02/10/2024 21-50  0 - 5 WBC/hpf Final   Comment:        Reflex urine culture not performed if WBC <=10, OR if Squamous epithelial cells >5. If Squamous epithelial cells >5 suggest recollection.    Bacteria, UA 02/10/2024 FEW (A)  NONE SEEN Final   Squamous Epithelial / HPF 02/10/2024 0-5  0 - 5 /HPF Final   Mucus 02/10/2024 PRESENT   Final   Amorphous Crystal 02/10/2024 PRESENT   Final   Performed at [facility] Lab, [address], [place], [place] [address]   Preg Test, Ur 02/10/2024 NEGATIVE  NEGATIVE Final   Comment:        THE SENSITIVITY OF THIS METHODOLOGY IS >25 mIU/mL. Performed at [facility] Lab, [address] [place], [place] [address]    Specimen Description 02/10/2024 BLOOD RIGHT FOREARM   Final   Special Requests 02/10/2024 BOTTLES DRAWN AEROBIC AND ANAEROBIC Blood Culture adequate volume   Final   Culture 02/10/2024    Final                   Value:NO GROWTH 5 DAYS Performed at [facility] Lab, [address], [place], [place] [address]    Report Status 02/10/2024 02/15/2024 FINAL   Final   Specimen Description 02/10/2024 BLOOD LEFT ANTECUBITAL   Final   Special Requests 02/10/2024 BOTTLES DRAWN AEROBIC AND ANAEROBIC Blood Culture results may not be optimal due to an inadequate volume of blood received in culture bottles   Final   Culture 02/10/2024    Final                   Value:NO GROWTH 5 DAYS Performed at [facility] Lab, [address] [place], [place] [address]    Report Status 02/10/2024 02/15/2024 FINAL   Final   Neisseria Gonorrhea 02/10/2024 Positive (A)   Final   Chlamydia 02/10/2024 Negative   Final   Comment 02/10/2024 Normal Reference Ranger Chlamydia - Negative   Final   Comment 02/10/2024 Normal Reference Range Neisseria Gonorrhea - Negative   Final   Yeast Wet Prep HPF POC 02/10/2024 NONE SEEN  NONE  SEEN Final   Trich, Wet Prep 02/10/2024 NONE SEEN  NONE SEEN Final   Clue Cells Wet Prep HPF POC 02/10/2024 NONE SEEN  NONE SEEN Final   WBC, Wet Prep HPF POC 02/10/2024 >=10 (A)  <10 Final   Sperm 02/10/2024 NONE SEEN   Final   Performed at [facility], [address], [place], [place] [address]   HIV Screen 4th Generation wRfx 02/10/2024 Non Reactive  Non Reactive Final   Performed at [facility], [address], [place], [place] [address]   RPR Ser Ql 02/10/2024 NON REACTIVE  NON REACTIVE Final   Performed at [facility], [address]  [place], [place] [address]   Hepatitis B Surface Ag 02/10/2024 NON REACTIVE  NON REACTIVE Final   HCV Ab 02/10/2024 Reactive (A)  NON REACTIVE Final   Comment: (NOTE) The CDC recommends that a Reactive HCV antibody result be followed up  with a HCV Nucleic Acid Amplification test.     Hep A IgM 02/10/2024 NON REACTIVE  NON REACTIVE Final   Hep B C IgM 02/10/2024 NON REACTIVE  NON REACTIVE Final   Performed at [facility], [address] [place], [place] [address]   Specimen Description 02/10/2024 URINE, RANDOM   Final   Special Requests 02/10/2024    Final                   Value:NONE Reflexed from [id number] Performed at [facility], [address], [place], [place] [address]    Culture 02/10/2024 MULTIPLE SPECIES PRESENT, SUGGEST RECOLLECTION (A)   Final   Report Status 02/10/2024 02/11/2024 FINAL   Final   WBC 02/11/2024 17.9 (H)  4.0 - 10.5 K/uL Final   RBC 02/11/2024 4.32  3.87 - 5.11 MIL/uL Final   Hemoglobin 02/11/2024 12.4  12.0 - 15.0 g/dL Final   HCT 93/83/7974 38.1  36.0 - 46.0 % Final   MCV 02/11/2024 88.2  80.0 - 100.0 fL Final   MCH 02/11/2024 28.7  26.0 - 34.0 pg Final   MCHC 02/11/2024 32.5  30.0 - 36.0 g/dL Final   RDW 93/83/7974 14.9  11.5 - 15.5 % Final   Platelets 02/11/2024 328  150 - 400 K/uL Final   nRBC 02/11/2024 0.0  0.0 - 0.2 % Final   Performed at [facility], [address] [place], [place] [address]   Sodium 02/11/2024 134 (L)  135 - 145 mmol/L Final   Potassium 02/11/2024 4.1  3.5 - 5.1 mmol/L Final   Chloride 02/11/2024 103  98 - 111 mmol/L Final   CO2 02/11/2024 23  22 - 32 mmol/L Final   Glucose, Bld 02/11/2024 245 (H)  70 - 99 mg/dL Final   Glucose reference range applies only to samples taken after fasting for at least 8 hours.   BUN 02/11/2024 9  6 - 20 mg/dL Final   Creatinine, Ser 02/11/2024 0.76  0.44 - 1.00 mg/dL Final   Calcium 93/83/7974 7.9 (L)  8.9 - 10.3 mg/dL Final   Total Protein 93/83/7974 5.7 (L)  6.5 - 8.1 g/dL  Final   Albumin 93/83/7974 2.3 (L)  3.5 - 5.0 g/dL Final   AST 93/83/7974 109 (H)  15 - 41 U/L Final   ALT 02/11/2024 95 (H)  0 - 44 U/L Final   Alkaline Phosphatase 02/11/2024 72  38 - 126 U/L Final   Total Bilirubin 02/11/2024 0.7  0.0 - 1.2 mg/dL  Final   GFR, Estimated 02/11/2024 >60  >60 mL/min Final   Comment: (NOTE) Calculated using the CKD-EPI Creatinine Equation (2021)    Anion gap 02/11/2024 8  5 - 15 Final   Performed at [facility], [address] [place], [place] [address]   HCV RNA Qnt(log copy/mL) 02/11/2024 6.778  log10 IU/mL Corrected   HepC Qn 02/11/2024 6,000,000  IU/mL Final   Test Information 02/11/2024 Comment   Final   Comment: (NOTE) The quantitative range of this assay is 15 IU/mL to 100 million IU/mL.    Hcv Genotype 02/11/2024 Comment   Final   Comment: (NOTE) To be performed on this specimen. Performed At: [facility] [address] [place], [place] [id number] Jennette Shorter MD Ey:[id number]    aPTT 02/11/2024 37 (H)  24 - 36 seconds Final   Comment:        IF BASELINE aPTT IS ELEVATED, SUGGEST PATIENT RISK ASSESSMENT BE USED TO DETERMINE APPROPRIATE ANTICOAGULANT THERAPY. Performed at [facility], [address], [place], [place] [address]    Prothrombin Time 02/11/2024 15.4 (H)  11.4 - 15.2 seconds Final   INR 02/11/2024 1.2  0.8 - 1.2 Final   Comment: (NOTE) INR goal varies based on device and disease states. Performed at [facility], [address] [place], [place] [address]    Fibrinogen  02/11/2024 524 (H)  210 - 475 mg/dL Final   Comment: (NOTE) Fibrinogen  results may be underestimated in patients receiving thrombolytic therapy. Performed at [facility], [address] [place], [place] [address]    TSH 02/11/2024 0.373  0.350 - 4.500 uIU/mL Final   Comment: Performed by a 3rd Generation assay with a functional sensitivity of <=0.01 uIU/mL. Performed at [facility], [address]  [place], [place] [address]    WBC 02/12/2024 9.0  4.0 - 10.5 K/uL Final   RBC 02/12/2024 4.32  3.87 - 5.11 MIL/uL Final   Hemoglobin 02/12/2024 12.6  12.0 - 15.0 g/dL Final   HCT 93/82/7974 38.4  36.0 - 46.0 % Final   MCV 02/12/2024 88.9  80.0 - 100.0 fL Final   MCH 02/12/2024 29.2  26.0 - 34.0 pg Final   MCHC 02/12/2024 32.8  30.0 - 36.0 g/dL Final   RDW 93/82/7974 14.9  11.5 - 15.5 % Final   Platelets 02/12/2024 340  150 - 400 K/uL Final   nRBC 02/12/2024 0.0  0.0 - 0.2 % Final   Performed at [facility], [address], [place], [place] [address]   Sodium 02/12/2024 138  135 - 145 mmol/L Final   Potassium 02/12/2024 3.9  3.5 - 5.1 mmol/L Final   Chloride 02/12/2024 106  98 - 111 mmol/L Final   CO2 02/12/2024 22  22 - 32 mmol/L Final   Glucose, Bld 02/12/2024 147 (H)  70 - 99 mg/dL Final   Glucose reference range applies only to samples taken after fasting for at least 8 hours.   BUN 02/12/2024 10  6 - 20 mg/dL Final   Creatinine, Ser 02/12/2024 0.66  0.44 - 1.00 mg/dL Final   Calcium 93/82/7974 8.2 (L)  8.9 - 10.3 mg/dL Final   Total Protein 93/82/7974 5.9 (L)  6.5 - 8.1 g/dL Final   Albumin 93/82/7974 2.2 (L)  3.5 - 5.0 g/dL Final   AST 93/82/7974 133 (H)  15 - 41 U/L Final   ALT 02/12/2024 93 (H)  0 - 44 U/L Final  Alkaline Phosphatase 02/12/2024 63  38 - 126 U/L Final   Total Bilirubin 02/12/2024 0.4  0.0 - 1.2 mg/dL Final   GFR, Estimated 02/12/2024 >60  >60 mL/min Final   Comment: (NOTE) Calculated using the CKD-EPI Creatinine Equation (2021)    Anion gap 02/12/2024 10  5 - 15 Final   Performed at [facility], [address], [place], [place] [address]   Hgb A1c MFr Bld 02/12/2024 5.4  4.8 - 5.6 % Final   Comment: (NOTE) Diagnosis of Diabetes The following HbA1c ranges recommended by the American Diabetes Association (ADA) may be used as an aid in the diagnosis of diabetes mellitus.  Hemoglobin             Suggested A1C NGSP%              Diagnosis  <5.7                    Non Diabetic  5.7-6.4                Pre-Diabetic  >6.4                   Diabetic  <7.0                   Glycemic control for                       adults with diabetes.     Mean Plasma Glucose 02/12/2024 108.28  mg/dL Final   Performed at [facility], [address] [place], [place] [address]   Hepatitis C Genotype 02/11/2024 1a   Final   Comment: (NOTE) This test was developed and its performance characteristics determined by [facility]. It has not been cleared or approved by the Food and Drug Administration. Performed At: [facility] [address] [place], [place] [id number] Jennette Shorter MD Ey:[id number]     Blood Alcohol level:  Lab Results  Component Value Date   [facility] <15 07/08/2024    Metabolic Disorder Labs: Lab Results  Component Value Date   HGBA1C 5.5 07/08/2024   MPG 111.15 07/08/2024   MPG 108.28 02/12/2024   No results found for: PROLACTIN Lab Results  Component Value Date   CHOL 174 07/08/2024   TRIG 102 07/08/2024   HDL 42 07/08/2024   CHOLHDL 4.1 07/08/2024   VLDL 20 07/08/2024   LDLCALC 112 (H) 07/08/2024   LDLCALC 73 09/22/2020    Therapeutic Lab Levels: No results found for: LITHIUM No results found for: VALPROATE No results found for: CBMZ  Physical Findings   GAD-7    Flowsheet Row Procedure visit from 11/01/2020 in [facility] Office Visit from 09/22/2020 in [facility] [employer] [facility] at [facility] Clinical Support from 06/21/2020 in [facility] at [facility] Clinical Support from 04/05/2020 in [facility] [employer] [facility] at [facility] Clinical Support from 01/19/2020 in [facility] for [employer] [facility] at [facility]  Total GAD-7 Score 7 2 10 8  0   PHQ2-9    Flowsheet Row ED from 07/10/2024 in [facility] ED from  07/08/2024 in [facility] Procedure visit from 11/01/2020 in [facility] for [facility] at [facility] Office Visit from 09/22/2020 in [place] for [employer] [facility] Clinical Support from 06/21/2020 in Center for [employer] at [facility]  PHQ-2 Total Score 6 4 0 0 2  PHQ-9 Total Score 21 12 0 2 10   Flowsheet Row ED from 07/10/2024 in [facility] ED from 07/08/2024 in [facility] ED from 05/17/2024 in [facility] Emergency Department at [facility]  C-SSRS RISK CATEGORY No Risk No Risk No Risk     Musculoskeletal  Strength & Muscle Tone: within normal limits Gait & Station: normal Patient leans: N/A  Psychiatric Specialty Exam  Presentation  General Appearance:  Disheveled  Eye Contact: Fair  Speech: Clear and Coherent  Speech Volume: Normal  Handedness: Right   Mood and Affect  Mood: Anxious  Affect: Congruent   Thought Process  Thought Processes: Coherent  Descriptions of Associations:Intact  Orientation:Full (Time, Place and Person)  Thought Content:Logical  Diagnosis of Schizophrenia or Schizoaffective disorder in past: No    Hallucinations:Hallucinations: None  Ideas of Reference:None  Suicidal Thoughts:Suicidal Thoughts: No  Homicidal Thoughts:Homicidal Thoughts: No   Sensorium  Memory: Immediate Fair; Recent Fair  Judgment: Fair  Insight: Fair   Art Therapist  Concentration: Fair  Attention Span: Poor  Recall: [employer] of Knowledge: Fair  Language: Fair   Psychomotor Activity  Psychomotor Activity: Psychomotor Activity: Normal   Assets  Assets: Desire for Improvement; Resilience   Sleep  Sleep: Sleep: Fair  Estimated Sleeping Duration (Last 24 Hours): 5.75-8.25 hours (Due to Daylight Saving Time, the durations displayed may not  accurately represent documentation during the time change interval)  No data recorded  Physical Exam  Physical Exam ROS Blood pressure 117/69, pulse 100, temperature 97.6 F (36.4 C), temperature source Oral, resp. rate 18, SpO2 98%. There is no height or weight on file to calculate BMI.  Treatment Plan Summary: Daily contact with patient to assess and evaluate symptoms and progress in treatment and Medication management. Continue multimodal treatment in [facility]. Maintain current medication regimen which appears effective and well tolerated.  Continue nonspecific nitrofurantoin  treatment Day 6/7. Anticipate discharge to community with anticipated admission to [facility] Treatment on 12/1.   KANDI JAYSON HAHN, MD 07/17/2024 5:27 PM

## 2024-07-17 NOTE — Group Note (Signed)
 Group Topic: Change and Accountability  Group Date: 07/17/2024 Start Time: 2030 End Time: 2100 Facilitators: Anice Benton LABOR, NT  Department: [facility]  Number of Participants: 5  Group Focus: clarity of thought, communication, feeling awareness/expression, and goals/reality orientation Treatment Modality:  Individual Therapy Interventions utilized were assignment and reminiscence Purpose: explore maladaptive thinking and increase insight  Name: Morgan Donaldson Date of Birth: Mar 17, 1981  MR: [id number]    Level of Participation: active Quality of Participation: cooperative and motivated Interactions with others: gave feedback Mood/Affect: appropriate and positive Triggers (if applicable): N/A Cognition: coherent/clear Progress: Moderate Response: good Plan: follow-up needed  Patients Problems:  Patient Active Problem List   Diagnosis Date Noted   Hyperthyroidism 07/16/2024   Opioid dependence with uncomplicated intoxication (HCC) 07/14/2024   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   Elevated liver function tests 07/14/2024   Opioid use with withdrawal (HCC) 07/14/2024   Polysubstance abuse (HCC) 07/10/2024   Pyosalpingitis 02/12/2024   Hepatitis C antibody detected 02/11/2024   PID (acute pelvic inflammatory disease) 02/10/2024   Gonorrhea 12/10/2023   Group A streptococcal infection 12/09/2023   IV drug abuse (HCC) 12/09/2023   Acute cystitis 12/09/2023   GAD (generalized anxiety disorder) 06/21/2020   Attention deficit hyperactivity disorder (ADHD), 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

## 2024-07-18 DIAGNOSIS — F411 Generalized anxiety disorder: Secondary | ICD-10-CM | POA: Diagnosis not present

## 2024-07-18 DIAGNOSIS — F1420 Cocaine dependence, uncomplicated: Secondary | ICD-10-CM | POA: Diagnosis not present

## 2024-07-18 DIAGNOSIS — F11220 Opioid dependence with intoxication, uncomplicated: Secondary | ICD-10-CM | POA: Diagnosis not present

## 2024-07-18 DIAGNOSIS — F1924 Other psychoactive substance dependence with psychoactive substance-induced mood disorder: Secondary | ICD-10-CM | POA: Diagnosis not present

## 2024-07-18 NOTE — ED Provider Notes (Signed)
 Behavioral Health Progress Note  Date and Time: 07/18/2024 5:33 PM Name: Morgan Donaldson MRN:  [id number]  Subjective:  Patient somewhat despondent initially about the lack of discharge options. Admits to requesting 72hr form but recanted. Still some desire to sign out of care due to despair but also desire to use. Craving for fentanyl  is prominent and increasing. Patient recognizes that treatment is the best approach but the drive to use is high even in her dreams. She inquires about Suboxone  with continuation at [facility] but in the absence of a transition plan it is unclear whether she could get consistent access prior to her 12/1 admission date for [facility]. The risk of relapse is high. Despite these issues patient is willing to continue working with [facility] team to find stepdown to bridge her until [facility] is available. Patient willing to engage in more unit activity (behavioral activation) but notes that she gets bored easily. Quetiapine  and buspirone  have been effective and well-tolerated.   Diagnosis:  Final diagnoses:  Polysubstance abuse (HCC)  Opioid dependence with uncomplicated intoxication (HCC)  Drug-induced mood disorder (HCC)  Elevated liver function tests  Opioid use with withdrawal (HCC)  Hyperthyroidism  Generalized anxiety disorder    Total Time spent with patient: I personally spent 20 minutes on the unit in direct patient care. The direct patient care time included face-to-face time with the patient, reviewing the patient's chart, communicating with other professionals, and coordinating care. Greater than 50% of this time was spent in counseling or coordinating care with the patient regarding goals of hospitalization, psycho-education, and discharge planning needs.   On my assessment the patient denied SI, HI, AVH, paranoia, ideas of reference, or first rank symptoms. Patient endorses significant specific drug cravings. Patient denied medication side-effects. Patient was not deemed  to be a danger to self or others and was in agreement with discharge plans as of today.  Provider Admission HPI: Morgan Donaldson is  a 43 year old female who presented  to [facility] with a history of Opioid Use Disorder, severe and Stimulant Use Disorder, cocaine type, severe along with untreated Bipolar Disorder. Pt was transferred to Facility Based Crisis unit for detox from Fentanyl  and crack cocaine. UDS positive for cocaine, morphine  and THC. BAL upon admission is negative.    Patient reported she has been using 1/2 gram of fentanyl  IV for the past year.  She has also been smoking approximately 1/2 gram of crack cocaine daily for the past year.  Patient stated she is done with using, stating she had this realization when she woke up under a bridge this morning.  Patient has been homeless for the past year.  She reported  she has a boyfriend, and things are going well outside of patient's substance use issues.  Patient stated her boyfriend is supportive of her seeking treatment and will allow her to stay with him once she is clean and has completed treatment.  Patient reported  she is also estranged from her sister and other family members due to her ongoing issues with substance use.  She is hopeful that after treatment she can reconnect with family.   Sleep: Fair  Appetite:  Fair  Current Medications:  Current Facility-Administered Medications  Medication Dose Route Frequency Provider Last Rate Last Admin   acetaminophen  (TYLENOL ) tablet 650 mg  650 mg Oral Q6H PRN Randall Starlyn HERO, NP   650 mg at 07/13/24 0836   alum & mag hydroxide-simeth (MAALOX/MYLANTA) 200-200-20 MG/5ML suspension 30 mL  30 mL  Oral Q4H PRN Onuoha, Chinwendu V, NP   30 mL at 07/15/24 2106   busPIRone  (BUSPAR ) tablet 20 mg  20 mg Oral BID Davona Kinoshita C, MD   20 mg at 07/18/24 1628   haloperidol  (HALDOL ) tablet 5 mg  5 mg Oral TID PRN Randall Starlyn HERO, NP   5 mg at 07/18/24 8371   And   diphenhydrAMINE   (BENADRYL ) capsule 50 mg  50 mg Oral TID PRN Randall Starlyn HERO, NP   50 mg at 07/18/24 1628   haloperidol  lactate (HALDOL ) injection 5 mg  5 mg Intramuscular TID PRN Randall Starlyn HERO, NP       And   diphenhydrAMINE  (BENADRYL ) injection 50 mg  50 mg Intramuscular TID PRN Randall Starlyn HERO, NP       And   LORazepam  (ATIVAN ) injection 2 mg  2 mg Intramuscular TID PRN Randall Starlyn HERO, NP       haloperidol  lactate (HALDOL ) injection 10 mg  10 mg Intramuscular TID PRN Randall Starlyn HERO, NP       And   diphenhydrAMINE  (BENADRYL ) injection 50 mg  50 mg Intramuscular TID PRN Randall Starlyn HERO, NP       And   LORazepam  (ATIVAN ) injection 2 mg  2 mg Intramuscular TID PRN Randall, Veronique M, NP       feeding supplement (ENSURE PLUS HIGH PROTEIN) liquid 237 mL  237 mL Oral TID BM Gottfried, Rhoda J, MD   237 mL at 07/18/24 1450   magnesium  hydroxide (MILK OF MAGNESIA) suspension 30 mL  30 mL Oral Daily PRN Randall Starlyn HERO, NP   30 mL at 07/18/24 1259   nicotine  (NICODERM CQ  - dosed in mg/24 hours) patch 21 mg  21 mg Transdermal Daily Randall, Veronique M, NP   21 mg at 07/18/24 9074   nicotine  polacrilex (NICORETTE ) gum 2 mg  2 mg Oral PRN Gottfried, Rhoda J, MD   2 mg at 07/15/24 1813   nitrofurantoin  (macrocrystal-monohydrate) (MACROBID ) capsule 100 mg  100 mg Oral Q12H Ezzard Staci SAILOR, NP   100 mg at 07/18/24 0925   QUEtiapine  (SEROQUEL ) tablet 100 mg  100 mg Oral Landrum Hahn, Kandi C, MD   100 mg at 07/18/24 9074   QUEtiapine  (SEROQUEL ) tablet 300 mg  300 mg Oral QHS Shaniqwa Horsman C, MD   300 mg at 07/17/24 2100   traZODone  (DESYREL ) tablet 100 mg  100 mg Oral QHS PRN Randall Starlyn HERO, NP   100 mg at 07/17/24 2100   No current outpatient medications on file.    Labs  Lab Results:  Admission on 07/10/2024  Component Date Value Ref Range Status   T3, Free 07/11/2024 2.2  2.0 - 4.4 pg/mL Final   Comment: (NOTE) Performed At: [facility] [address] [place], [place] [id number] Jennette Shorter MD Ey:[id number]    Free T4 07/11/2024 0.71  0.61 - 1.12 ng/dL Final   Comment: (NOTE) Biotin ingestion may interfere with free T4 tests. If the results are inconsistent with the TSH level, previous test results, or the clinical presentation, then consider biotin interference. If needed, order repeat testing after stopping biotin. Performed at [facility], [address], [place], [place] [address]    Neisseria Gonorrhea 07/12/2024 Negative   Final   Chlamydia 07/12/2024 Negative   Final   Comment 07/12/2024 Normal Reference Ranger Chlamydia - Negative   Final   Comment 07/12/2024 Normal Reference Range Neisseria Gonorrhea - Negative   Final  Total Protein 07/12/2024 7.1  6.5 - 8.1 g/dL Final   Albumin 88/84/7974 3.3 (L)  3.5 - 5.0 g/dL Final   AST 88/84/7974 67 (H)  15 - 41 U/L Final   ALT 07/12/2024 95 (H)  0 - 44 U/L Final   Alkaline Phosphatase 07/12/2024 110  38 - 126 U/L Final   Total Bilirubin 07/12/2024 0.6  0.0 - 1.2 mg/dL Final   Bilirubin, Direct 07/12/2024 <0.1  0.0 - 0.2 mg/dL Final   Indirect Bilirubin 07/12/2024 NOT CALCULATED  0.3 - 0.9 mg/dL Final   Performed at [facility], [address] [place], [place] [address]   RPR Ser Ql 07/12/2024 NON REACTIVE  NON REACTIVE Final   Performed at [facility], [address] [place], [place] [address]   [facility] test code 07/12/2024 [id number]   Final   [facility] test name 07/12/2024 [id number]   Final   Source ([facility]) 07/12/2024 SERUM   Final   Performed at [facility], [address] [place], [place] [address]   Misc [facility] result 07/12/2024 COMMENT   Final   Comment: (NOTE) Test Ordered: [id number] HIV Ab/p24 Ag with Reflex HIV Ab/p24 Ag Screen           Note:                     BN     Non Reactive                                                  Reference Range: Non Reactive                          HIV-1/HIV-2 antibodies and HIV-1  p24 antigen were NOT detected. There is no laboratory evidence of HIV infection. HIV Negative Performed At: [facility] [address] [place], [place] [id number] Jennette Shorter MD Ey:[id number]   Admission on 07/08/2024, Discharged on 07/10/2024  Component Date Value Ref Range Status   WBC 07/08/2024 10.6 (H)  4.0 - 10.5 K/uL Final   RBC 07/08/2024 5.65 (H)  3.87 - 5.11 MIL/uL Final   Hemoglobin 07/08/2024 16.5 (H)  12.0 - 15.0 g/dL Final   HCT 88/88/7974 49.4 (H)  36.0 - 46.0 % Final   MCV 07/08/2024 87.4  80.0 - 100.0 fL Final   MCH 07/08/2024 29.2  26.0 - 34.0 pg Final   MCHC 07/08/2024 33.4  30.0 - 36.0 g/dL Final   RDW 88/88/7974 13.7  11.5 - 15.5 % Final   Platelets 07/08/2024 422 (H)  150 - 400 K/uL Final   nRBC 07/08/2024 0.0  0.0 - 0.2 % Final   Neutrophils Relative % 07/08/2024 75  % Final   Neutro Abs 07/08/2024 7.9 (H)  1.7 - 7.7 K/uL Final   Lymphocytes Relative 07/08/2024 17  % Final   Lymphs Abs 07/08/2024 1.8  0.7 - 4.0 K/uL Final   Monocytes Relative 07/08/2024 7  % Final   Monocytes Absolute 07/08/2024 0.7  0.1 - 1.0 K/uL Final   Eosinophils Relative 07/08/2024 0  % Final   Eosinophils Absolute 07/08/2024 0.0  0.0 - 0.5 K/uL Final   Basophils Relative 07/08/2024 1  % Final   Basophils Absolute 07/08/2024 0.1  0.0 - 0.1 K/uL Final   Immature Granulocytes 07/08/2024  0  % Final   Abs Immature Granulocytes 07/08/2024 0.04  0.00 - 0.07 K/uL Final   Performed at [facility], [address] [place], [place] [address]   Sodium 07/08/2024 140  135 - 145 mmol/L Final   Potassium 07/08/2024 4.1  3.5 - 5.1 mmol/L Final   Chloride 07/08/2024 100  98 - 111 mmol/L Final   CO2 07/08/2024 27  22 - 32 mmol/L Final   Glucose, Bld 07/08/2024 113 (H)  70 - 99 mg/dL Final   Glucose reference range applies only to samples taken after fasting for at least 8 hours.   BUN 07/08/2024 10  6 - 20 mg/dL Final   Creatinine, Ser 07/08/2024 0.74  0.44 - 1.00 mg/dL Final    Calcium 88/88/7974 9.9  8.9 - 10.3 mg/dL Final   Total Protein 88/88/7974 7.9  6.5 - 8.1 g/dL Final   Albumin 88/88/7974 3.5  3.5 - 5.0 g/dL Final   AST 88/88/7974 109 (H)  15 - 41 U/L Final   ALT 07/08/2024 132 (H)  0 - 44 U/L Final   Alkaline Phosphatase 07/08/2024 74  38 - 126 U/L Final   Total Bilirubin 07/08/2024 0.3  0.0 - 1.2 mg/dL Final   GFR, Estimated 07/08/2024 >60  >60 mL/min Final   Comment: (NOTE) Calculated using the CKD-EPI Creatinine Equation (2021)    Anion gap 07/08/2024 13  5 - 15 Final   Performed at [facility], [address] [place], [place] [address]   Hgb A1c MFr Bld 07/08/2024 5.5  4.8 - 5.6 % Final   Comment: (NOTE) Diagnosis of Diabetes The following HbA1c ranges recommended by the American Diabetes Association (ADA) may be used as an aid in the diagnosis of diabetes mellitus.  Hemoglobin             Suggested A1C NGSP%              Diagnosis  <5.7                   Non Diabetic  5.7-6.4                Pre-Diabetic  >6.4                   Diabetic  <7.0                   Glycemic control for                       adults with diabetes.     Mean Plasma Glucose 07/08/2024 111.15  mg/dL Final   Performed at [facility], [address], [place], [place] [address]   Magnesium  07/08/2024 2.1  1.7 - 2.4 mg/dL Final   Performed at [facility], [address], [place], [place] [address]   Alcohol, Ethyl (B) 07/08/2024 <15  <15 mg/dL Final   Comment: (NOTE) For medical purposes only. Performed at [facility], [address] [place], [place] [address]    Cholesterol 07/08/2024 174  0 - 200 mg/dL Final   Triglycerides 88/88/7974 102  <150 mg/dL Final   HDL 88/88/7974 42  >40 mg/dL Final   Total CHOL/HDL Ratio 07/08/2024 4.1  RATIO Final   VLDL 07/08/2024 20  0 - 40 mg/dL Final   LDL Cholesterol 07/08/2024 112 (H)  0 - 99 mg/dL Final   Comment:  Total Cholesterol/HDL:CHD Risk Coronary Heart Disease Risk Table                      Men   Women  1/2 Average Risk   3.4   3.3  Average Risk       5.0   4.4  2 X Average Risk   9.6   7.1  3 X Average Risk  23.4   11.0        Use the calculated Patient Ratio above and the CHD Risk Table to determine the patient's CHD Risk.        ATP III CLASSIFICATION (LDL):  <100     mg/dL   Optimal  899-870  mg/dL   Near or Above                    Optimal  130-159  mg/dL   Borderline  839-810  mg/dL   High  >809     mg/dL   Very High Performed at [facility], [address], [place], [place] [address]    Color, Urine 07/09/2024 YELLOW  YELLOW Final   APPearance 07/09/2024 TURBID (A)  CLEAR Final   Specific Gravity, Urine 07/09/2024 1.017  1.005 - 1.030 Final   pH 07/09/2024 7.0  5.0 - 8.0 Final   Glucose, UA 07/09/2024 NEGATIVE  NEGATIVE mg/dL Final   Hgb urine dipstick 07/09/2024 NEGATIVE  NEGATIVE Final   Bilirubin Urine 07/09/2024 NEGATIVE  NEGATIVE Final   Ketones, ur 07/09/2024 NEGATIVE  NEGATIVE mg/dL Final   Protein, ur 88/87/7974 NEGATIVE  NEGATIVE mg/dL Final   Nitrite 88/87/7974 NEGATIVE  NEGATIVE Final   Leukocytes,Ua 07/09/2024 MODERATE (A)  NEGATIVE Final   RBC / HPF 07/09/2024 0-5  0 - 5 RBC/hpf Final   WBC, UA 07/09/2024 21-50  0 - 5 WBC/hpf Final   Bacteria, UA 07/09/2024 MANY (A)  NONE SEEN Final   Squamous Epithelial / HPF 07/09/2024 0-5  0 - 5 /HPF Final   WBC Clumps 07/09/2024 PRESENT   Final   Performed at [facility], [address], [place], [place] [address]   Preg Test, Ur 07/09/2024 Negative  Negative Final   POC Amphetamine  UR 07/09/2024 None Detected  NONE DETECTED (Cut Off Level 1000 ng/mL) Final   POC Secobarbital (BAR) 07/09/2024 None Detected  NONE DETECTED (Cut Off Level 300 ng/mL) Final   POC Buprenorphine  (BUP) 07/09/2024 None Detected  NONE DETECTED (Cut Off Level 10 ng/mL) Final   POC Oxazepam (BZO) 07/09/2024 None Detected  NONE DETECTED (Cut Off Level 300 ng/mL) Final   POC Cocaine UR 07/09/2024 Positive  (A)  NONE DETECTED (Cut Off Level 300 ng/mL) Final   POC Methamphetamine UR 07/09/2024 None Detected  NONE DETECTED (Cut Off Level 1000 ng/mL) Final   POC Morphine  07/09/2024 Positive (A)  NONE DETECTED (Cut Off Level 300 ng/mL) Final   POC Methadone UR 07/09/2024 None Detected  NONE DETECTED (Cut Off Level 300 ng/mL) Final   POC Oxycodone  UR 07/09/2024 None Detected  NONE DETECTED (Cut Off Level 100 ng/mL) Final   POC Marijuana UR 07/09/2024 Positive (A)  NONE DETECTED (Cut Off Level 50 ng/mL) Final   TSH 07/08/2024 0.112 (L)  0.350 - 4.500 uIU/mL Final   Comment: Performed by a 3rd Generation assay with a functional sensitivity of <=0.01 uIU/mL. Performed at [facility], [address], [place], [place] [address]    Preg Test, Ur 07/09/2024 NEGATIVE  NEGATIVE Final   Comment:        THE SENSITIVITY OF THIS METHODOLOGY IS >20 mIU/mL.   Admission on 05/17/2024, Discharged on 05/18/2024  Component Date Value Ref Range Status   WBC 05/17/2024 9.0  4.0 - 10.5 K/uL Final   RBC 05/17/2024 4.35  3.87 - 5.11 MIL/uL Final   Hemoglobin 05/17/2024 12.8  12.0 - 15.0 g/dL Final   HCT 90/79/7974 39.7  36.0 - 46.0 % Final   MCV 05/17/2024 91.3  80.0 - 100.0 fL Final   MCH 05/17/2024 29.4  26.0 - 34.0 pg Final   MCHC 05/17/2024 32.2  30.0 - 36.0 g/dL Final   RDW 90/79/7974 14.1  11.5 - 15.5 % Final   Platelets 05/17/2024 286  150 - 400 K/uL Final   nRBC 05/17/2024 0.0  0.0 - 0.2 % Final   Neutrophils Relative % 05/17/2024 65  % Final   Neutro Abs 05/17/2024 5.8  1.7 - 7.7 K/uL Final   Lymphocytes Relative 05/17/2024 23  % Final   Lymphs Abs 05/17/2024 2.1  0.7 - 4.0 K/uL Final   Monocytes Relative 05/17/2024 8  % Final   Monocytes Absolute 05/17/2024 0.8  0.1 - 1.0 K/uL Final   Eosinophils Relative 05/17/2024 4  % Final   Eosinophils Absolute 05/17/2024 0.3  0.0 - 0.5 K/uL Final   Basophils Relative 05/17/2024 0  % Final   Basophils Absolute 05/17/2024 0.0  0.0 - 0.1 K/uL Final    Immature Granulocytes 05/17/2024 0  % Final   Abs Immature Granulocytes 05/17/2024 0.04  0.00 - 0.07 K/uL Final   Performed at [facility], [address], [place], [place] [address]   Preg, Serum 05/17/2024 NEGATIVE  NEGATIVE Final   Comment:        THE SENSITIVITY OF THIS METHODOLOGY IS >10 mIU/mL. Performed at [facility], [address] [place], [place] [address]    Sodium 05/17/2024 141  135 - 145 mmol/L Final   Potassium 05/17/2024 3.5  3.5 - 5.1 mmol/L Final   Chloride 05/17/2024 104  98 - 111 mmol/L Final   CO2 05/17/2024 26  22 - 32 mmol/L Final   Glucose, Bld 05/17/2024 118 (H)  70 - 99 mg/dL Final   Glucose reference range applies only to samples taken after fasting for at least 8 hours.   BUN 05/17/2024 11  6 - 20 mg/dL Final   Creatinine, Ser 05/17/2024 0.78  0.44 - 1.00 mg/dL Final   Calcium 90/79/7974 8.8 (L)  8.9 - 10.3 mg/dL Final   Total Protein 90/79/7974 7.0  6.5 - 8.1 g/dL Final   Albumin 90/79/7974 3.0 (L)  3.5 - 5.0 g/dL Final   AST 90/79/7974 115 (H)  15 - 41 U/L Final   ALT 05/17/2024 144 (H)  0 - 44 U/L Final   Alkaline Phosphatase 05/17/2024 71  38 - 126 U/L Final   Total Bilirubin 05/17/2024 0.5  0.0 - 1.2 mg/dL Final   GFR, Estimated 05/17/2024 >60  >60 mL/min Final   Comment: (NOTE) Calculated using the CKD-EPI Creatinine Equation (2021)    Anion gap 05/17/2024 11  5 - 15 Final   Performed at [facility], [address], [place], [place] [address]   Sodium 05/17/2024 142  135 - 145 mmol/L Final   Potassium 05/17/2024 3.5  3.5 - 5.1 mmol/L Final   Chloride 05/17/2024 103  98 - 111 mmol/L Final   BUN 05/17/2024 12  6 -  20 mg/dL Final   Creatinine, Ser 05/17/2024 0.70  0.44 - 1.00 mg/dL Final   Glucose, Bld 90/79/7974 120 (H)  70 - 99 mg/dL Final   Glucose reference range applies only to samples taken after fasting for at least 8 hours.   Calcium, Ion 05/17/2024 1.13 (L)  1.15 - 1.40 mmol/L Final   TCO2 05/17/2024 28  22 - 32  mmol/L Final   Hemoglobin 05/17/2024 11.6 (L)  12.0 - 15.0 g/dL Final   HCT 90/79/7974 34.0 (L)  36.0 - 46.0 % Final  Admission on 03/25/2024, Discharged on 03/25/2024  Component Date Value Ref Range Status   Sodium 03/25/2024 140  135 - 145 mmol/L Final   Potassium 03/25/2024 3.2 (L)  3.5 - 5.1 mmol/L Final   Chloride 03/25/2024 105  98 - 111 mmol/L Final   BUN 03/25/2024 9  6 - 20 mg/dL Final   Creatinine, Ser 03/25/2024 0.70  0.44 - 1.00 mg/dL Final   Glucose, Bld 92/70/7974 132 (H)  70 - 99 mg/dL Final   Glucose reference range applies only to samples taken after fasting for at least 8 hours.   Calcium, Ion 03/25/2024 0.99 (L)  1.15 - 1.40 mmol/L Final   TCO2 03/25/2024 24  22 - 32 mmol/L Final   Hemoglobin 03/25/2024 12.9  12.0 - 15.0 g/dL Final   HCT 92/70/7974 38.0  36.0 - 46.0 % Final   Lactic Acid, Venous 03/25/2024 3.2 (HH)  0.5 - 1.9 mmol/L Final   Comment 03/25/2024 NOTIFIED PHYSICIAN   Final  Admission on 02/10/2024, Discharged on 02/12/2024  Component Date Value Ref Range Status   WBC 02/10/2024 16.8 (H)  4.0 - 10.5 K/uL Final   RBC 02/10/2024 4.46  3.87 - 5.11 MIL/uL Final   Hemoglobin 02/10/2024 12.7  12.0 - 15.0 g/dL Final   HCT 93/84/7974 39.5  36.0 - 46.0 % Final   MCV 02/10/2024 88.6  80.0 - 100.0 fL Final   MCH 02/10/2024 28.5  26.0 - 34.0 pg Final   MCHC 02/10/2024 32.2  30.0 - 36.0 g/dL Final   RDW 93/84/7974 14.8  11.5 - 15.5 % Final   Platelets 02/10/2024 331  150 - 400 K/uL Final   nRBC 02/10/2024 0.0  0.0 - 0.2 % Final   Neutrophils Relative % 02/10/2024 83  % Final   Neutro Abs 02/10/2024 14.0 (H)  1.7 - 7.7 K/uL Final   Lymphocytes Relative 02/10/2024 9  % Final   Lymphs Abs 02/10/2024 1.6  0.7 - 4.0 K/uL Final   Monocytes Relative 02/10/2024 6  % Final   Monocytes Absolute 02/10/2024 1.0  0.1 - 1.0 K/uL Final   Eosinophils Relative 02/10/2024 1  % Final   Eosinophils Absolute 02/10/2024 0.1  0.0 - 0.5 K/uL Final   Basophils Relative 02/10/2024 0  %  Final   Basophils Absolute 02/10/2024 0.1  0.0 - 0.1 K/uL Final   Immature Granulocytes 02/10/2024 1  % Final   Abs Immature Granulocytes 02/10/2024 0.08 (H)  0.00 - 0.07 K/uL Final   Performed at [facility], [address] [place], [place] [address]   Sodium 02/10/2024 136  135 - 145 mmol/L Final   Potassium 02/10/2024 3.9  3.5 - 5.1 mmol/L Final   Chloride 02/10/2024 101  98 - 111 mmol/L Final   CO2 02/10/2024 25  22 - 32 mmol/L Final   Glucose, Bld 02/10/2024 94  70 - 99 mg/dL Final   Glucose reference range applies only to samples  taken after fasting for at least 8 hours.   BUN 02/10/2024 7  6 - 20 mg/dL Final   Creatinine, Ser 02/10/2024 0.66  0.44 - 1.00 mg/dL Final   Calcium 93/84/7974 8.7 (L)  8.9 - 10.3 mg/dL Final   Total Protein 93/84/7974 6.8  6.5 - 8.1 g/dL Final   Albumin 93/84/7974 3.2 (L)  3.5 - 5.0 g/dL Final   AST 93/84/7974 215 (H)  15 - 41 U/L Final   ALT 02/10/2024 143 (H)  0 - 44 U/L Final   Alkaline Phosphatase 02/10/2024 82  38 - 126 U/L Final   Total Bilirubin 02/10/2024 1.0  0.0 - 1.2 mg/dL Final   GFR, Estimated 02/10/2024 >60  >60 mL/min Final   Comment: (NOTE) Calculated using the CKD-EPI Creatinine Equation (2021)    Anion gap 02/10/2024 10  5 - 15 Final   Performed at [facility], [address], [place], [place] [address]   Lipase 02/10/2024 25  11 - 51 U/L Final   Performed at [facility], [address], [place], [place] [address]   Specimen Source 02/10/2024 URINE, CLEAN CATCH   Final   Color, Urine 02/10/2024 YELLOW  YELLOW Final   APPearance 02/10/2024 CLOUDY (A)  CLEAR Final   Specific Gravity, Urine 02/10/2024 1.015  1.005 - 1.030 Final   pH 02/10/2024 7.0  5.0 - 8.0 Final   Glucose, UA 02/10/2024 NEGATIVE  NEGATIVE mg/dL Final   Hgb urine dipstick 02/10/2024 NEGATIVE  NEGATIVE Final   Bilirubin Urine 02/10/2024 NEGATIVE  NEGATIVE Final   Ketones, ur 02/10/2024 NEGATIVE  NEGATIVE mg/dL Final   Protein, ur 93/84/7974 30  (A)  NEGATIVE mg/dL Final   Nitrite 93/84/7974 POSITIVE (A)  NEGATIVE Final   Leukocytes,Ua 02/10/2024 MODERATE (A)  NEGATIVE Final   RBC / HPF 02/10/2024 0-5  0 - 5 RBC/hpf Final   WBC, UA 02/10/2024 21-50  0 - 5 WBC/hpf Final   Comment:        Reflex urine culture not performed if WBC <=10, OR if Squamous epithelial cells >5. If Squamous epithelial cells >5 suggest recollection.    Bacteria, UA 02/10/2024 FEW (A)  NONE SEEN Final   Squamous Epithelial / HPF 02/10/2024 0-5  0 - 5 /HPF Final   Mucus 02/10/2024 PRESENT   Final   Amorphous Crystal 02/10/2024 PRESENT   Final   Performed at [facility], [address], [place], [place] [address]   Preg Test, Ur 02/10/2024 NEGATIVE  NEGATIVE Final   Comment:        THE SENSITIVITY OF THIS METHODOLOGY IS >25 mIU/mL. Performed at [facility], [address] [place], [place] [address]    Specimen Description 02/10/2024 BLOOD RIGHT FOREARM   Final   Special Requests 02/10/2024 BOTTLES DRAWN AEROBIC AND ANAEROBIC Blood Culture adequate volume   Final   Culture 02/10/2024    Final                   Value:NO GROWTH 5 DAYS Performed at [facility], [address], [place], [place] [address]    Report Status 02/10/2024 02/15/2024 FINAL   Final   Specimen Description 02/10/2024 BLOOD LEFT ANTECUBITAL   Final   Special Requests 02/10/2024 BOTTLES DRAWN AEROBIC AND ANAEROBIC Blood Culture results may not be optimal due to an inadequate volume of blood received in culture bottles   Final   Culture 02/10/2024    Final  Value:NO GROWTH 5 DAYS Performed at [facility], [address], [place], [place] [address]    Report Status 02/10/2024 02/15/2024 FINAL   Final   Neisseria Gonorrhea 02/10/2024 Positive (A)   Final   Chlamydia 02/10/2024 Negative   Final   Comment 02/10/2024 Normal Reference Ranger Chlamydia - Negative   Final   Comment 02/10/2024 Normal Reference Range Neisseria Gonorrhea - Negative    Final   Yeast Wet Prep HPF POC 02/10/2024 NONE SEEN  NONE SEEN Final   Trich, Wet Prep 02/10/2024 NONE SEEN  NONE SEEN Final   Clue Cells Wet Prep HPF POC 02/10/2024 NONE SEEN  NONE SEEN Final   WBC, Wet Prep HPF POC 02/10/2024 >=10 (A)  <10 Final   Sperm 02/10/2024 NONE SEEN   Final   Performed at [facility], [address] [place], [place] [address]   HIV Screen 4th Generation wRfx 02/10/2024 Non Reactive  Non Reactive Final   Performed at [facility], [address], [place], [place] [address]   RPR Ser Ql 02/10/2024 NON REACTIVE  NON REACTIVE Final   Performed at [facility], [address], [place], [place] [address]   Hepatitis B Surface Ag 02/10/2024 NON REACTIVE  NON REACTIVE Final   HCV Ab 02/10/2024 Reactive (A)  NON REACTIVE Final   Comment: (NOTE) The CDC recommends that a Reactive HCV antibody result be followed up  with a HCV Nucleic Acid Amplification test.     Hep A IgM 02/10/2024 NON REACTIVE  NON REACTIVE Final   Hep B C IgM 02/10/2024 NON REACTIVE  NON REACTIVE Final   Performed at [facility], [address], [place], [place] [address]   Specimen Description 02/10/2024 URINE, RANDOM   Final   Special Requests 02/10/2024    Final                   Value:NONE Reflexed from [id number] Performed at [facility], [address] [place], [place] [address]    Culture 02/10/2024 MULTIPLE SPECIES PRESENT, SUGGEST RECOLLECTION (A)   Final   Report Status 02/10/2024 02/11/2024 FINAL   Final   WBC 02/11/2024 17.9 (H)  4.0 - 10.5 K/uL Final   RBC 02/11/2024 4.32  3.87 - 5.11 MIL/uL Final   Hemoglobin 02/11/2024 12.4  12.0 - 15.0 g/dL Final   HCT 93/83/7974 38.1  36.0 - 46.0 % Final   MCV 02/11/2024 88.2  80.0 - 100.0 fL Final   MCH 02/11/2024 28.7  26.0 - 34.0 pg Final   MCHC 02/11/2024 32.5  30.0 - 36.0 g/dL Final   RDW 93/83/7974 14.9  11.5 - 15.5 % Final   Platelets 02/11/2024 328  150 - 400 K/uL Final   nRBC 02/11/2024 0.0  0.0 - 0.2 %  Final   Performed at [facility], [address] [place], [place] [address]   Sodium 02/11/2024 134 (L)  135 - 145 mmol/L Final   Potassium 02/11/2024 4.1  3.5 - 5.1 mmol/L Final   Chloride 02/11/2024 103  98 - 111 mmol/L Final   CO2 02/11/2024 23  22 - 32 mmol/L Final   Glucose, Bld 02/11/2024 245 (H)  70 - 99 mg/dL Final   Glucose reference range applies only to samples taken after fasting for at least 8 hours.   BUN 02/11/2024 9  6 - 20 mg/dL Final   Creatinine, Ser 02/11/2024 0.76  0.44 - 1.00 mg/dL  Final   Calcium 02/11/2024 7.9 (L)  8.9 - 10.3 mg/dL Final   Total Protein 93/83/7974 5.7 (L)  6.5 - 8.1 g/dL Final   Albumin 93/83/7974 2.3 (L)  3.5 - 5.0 g/dL Final   AST 93/83/7974 109 (H)  15 - 41 U/L Final   ALT 02/11/2024 95 (H)  0 - 44 U/L Final   Alkaline Phosphatase 02/11/2024 72  38 - 126 U/L Final   Total Bilirubin 02/11/2024 0.7  0.0 - 1.2 mg/dL Final   GFR, Estimated 02/11/2024 >60  >60 mL/min Final   Comment: (NOTE) Calculated using the CKD-EPI Creatinine Equation (2021)    Anion gap 02/11/2024 8  5 - 15 Final   Performed at [facility], [address] [place], [place] [address]   HCV RNA Qnt(log copy/mL) 02/11/2024 6.778  log10 IU/mL Corrected   HepC Qn 02/11/2024 6,000,000  IU/mL Final   Test Information 02/11/2024 Comment   Final   Comment: (NOTE) The quantitative range of this assay is 15 IU/mL to 100 million IU/mL.    Hcv Genotype 02/11/2024 Comment   Final   Comment: (NOTE) To be performed on this specimen. Performed At: [facility] [address] [place], [place] [id number] Jennette Shorter MD Ey:[id number]    aPTT 02/11/2024 37 (H)  24 - 36 seconds Final   Comment:        IF BASELINE aPTT IS ELEVATED, SUGGEST PATIENT RISK ASSESSMENT BE USED TO DETERMINE APPROPRIATE ANTICOAGULANT THERAPY. Performed at [facility], [address], [place], [place] [address]    Prothrombin Time 02/11/2024 15.4 (H)  11.4 - 15.2 seconds Final    INR 02/11/2024 1.2  0.8 - 1.2 Final   Comment: (NOTE) INR goal varies based on device and disease states. Performed at [facility], [address], [place], [place] [address]    Fibrinogen  02/11/2024 524 (H)  210 - 475 mg/dL Final   Comment: (NOTE) Fibrinogen  results may be underestimated in patients receiving thrombolytic therapy. Performed at [facility], [address], [place], [place] [address]    TSH 02/11/2024 0.373  0.350 - 4.500 uIU/mL Final   Comment: Performed by a 3rd Generation assay with a functional sensitivity of <=0.01 uIU/mL. Performed at [facility], [address] [place], [place] [address]    WBC 02/12/2024 9.0  4.0 - 10.5 K/uL Final   RBC 02/12/2024 4.32  3.87 - 5.11 MIL/uL Final   Hemoglobin 02/12/2024 12.6  12.0 - 15.0 g/dL Final   HCT 93/82/7974 38.4  36.0 - 46.0 % Final   MCV 02/12/2024 88.9  80.0 - 100.0 fL Final   MCH 02/12/2024 29.2  26.0 - 34.0 pg Final   MCHC 02/12/2024 32.8  30.0 - 36.0 g/dL Final   RDW 93/82/7974 14.9  11.5 - 15.5 % Final   Platelets 02/12/2024 340  150 - 400 K/uL Final   nRBC 02/12/2024 0.0  0.0 - 0.2 % Final   Performed at [facility], [address], [place], [place] [address]   Sodium 02/12/2024 138  135 - 145 mmol/L Final   Potassium 02/12/2024 3.9  3.5 - 5.1 mmol/L Final   Chloride 02/12/2024 106  98 - 111 mmol/L Final   CO2 02/12/2024 22  22 - 32 mmol/L Final   Glucose, Bld 02/12/2024 147 (H)  70 - 99 mg/dL Final   Glucose reference range applies only to samples taken after fasting for at least 8 hours.  BUN 02/12/2024 10  6 - 20 mg/dL Final   Creatinine, Ser 02/12/2024 0.66  0.44 - 1.00 mg/dL Final   Calcium 93/82/7974 8.2 (L)  8.9 - 10.3 mg/dL Final   Total Protein 93/82/7974 5.9 (L)  6.5 - 8.1 g/dL Final   Albumin 93/82/7974 2.2 (L)  3.5 - 5.0 g/dL Final   AST 93/82/7974 133 (H)  15 - 41 U/L Final   ALT 02/12/2024 93 (H)  0 - 44 U/L Final   Alkaline Phosphatase 02/12/2024 63  38 - 126  U/L Final   Total Bilirubin 02/12/2024 0.4  0.0 - 1.2 mg/dL Final   GFR, Estimated 02/12/2024 >60  >60 mL/min Final   Comment: (NOTE) Calculated using the CKD-EPI Creatinine Equation (2021)    Anion gap 02/12/2024 10  5 - 15 Final   Performed at [facility], [address] [place], [place] [address]   Hgb A1c MFr Bld 02/12/2024 5.4  4.8 - 5.6 % Final   Comment: (NOTE) Diagnosis of Diabetes The following HbA1c ranges recommended by the American Diabetes Association (ADA) may be used as an aid in the diagnosis of diabetes mellitus.  Hemoglobin             Suggested A1C NGSP%              Diagnosis  <5.7                   Non Diabetic  5.7-6.4                Pre-Diabetic  >6.4                   Diabetic  <7.0                   Glycemic control for                       adults with diabetes.     Mean Plasma Glucose 02/12/2024 108.28  mg/dL Final   Performed at [facility], [address] [place], [place] [address]   Hepatitis C Genotype 02/11/2024 1a   Final   Comment: (NOTE) This test was developed and its performance characteristics determined by [facility]. It has not been cleared or approved by the Food and Drug Administration. Performed At: [facility] [address] [place], [place] [id number] Jennette Shorter MD Ey:[id number]     Blood Alcohol level:  Lab Results  Component Value Date   [facility] <15 07/08/2024    Metabolic Disorder Labs: Lab Results  Component Value Date   HGBA1C 5.5 07/08/2024   MPG 111.15 07/08/2024   MPG 108.28 02/12/2024   No results found for: PROLACTIN Lab Results  Component Value Date   CHOL 174 07/08/2024   TRIG 102 07/08/2024   HDL 42 07/08/2024   CHOLHDL 4.1 07/08/2024   VLDL 20 07/08/2024   LDLCALC 112 (H) 07/08/2024   LDLCALC 73 09/22/2020    Therapeutic Lab Levels: No results found for: LITHIUM No results found for: VALPROATE No results found for: CBMZ  Physical Findings   GAD-7     Flowsheet Row Procedure visit from 11/01/2020 in [facility] for [facility] at [facility] Office Visit from 09/22/2020 in [facility] for [facility] at [facility] Clinical Support from 06/21/2020 in [facility] for [facility] at [facility] Clinical Support from 04/05/2020 in [place] for [facility]care at [facility] Clinical Support from 01/19/2020 in [facility]  Total GAD-7 Score 7 2 10 8  0   PHQ2-9    Flowsheet Row ED from 07/10/2024 in [facility] ED from 07/08/2024 in [facility] Procedure visit from 11/01/2020 in Center for [facility] at [facility] Office Visit from 09/22/2020 in [facility] for [facility] at [facility] Clinical Support from 06/21/2020 in [facility] for [facility] at [facility]  PHQ-2 Total Score 6 4 0 0 2  PHQ-9 Total Score 21 12 0 2 10   Flowsheet Row ED from 07/10/2024 in [facility] ED from 07/08/2024 in [facility] ED from 05/17/2024 in [facility] Emergency Department at [facility]  C-SSRS RISK CATEGORY No Risk No Risk No Risk     Musculoskeletal  Strength & Muscle Tone: within normal limits Gait & Station: normal Patient leans: N/A  Psychiatric Specialty Exam  Presentation  General Appearance:  Disheveled  Eye Contact: Fair  Speech: Clear and Coherent  Speech Volume: Normal  Handedness: Right   Mood and Affect  Mood: Anxious; Depressed  Affect: Congruent   Thought Process  Thought Processes: Coherent  Descriptions of Associations:Intact  Orientation:Full (Time, Place and Person)  Thought Content:Logical  Diagnosis of Schizophrenia or Schizoaffective disorder in past: No     Hallucinations:Hallucinations: None  Ideas of Reference:None  Suicidal Thoughts:Suicidal Thoughts: No  Homicidal Thoughts:Homicidal Thoughts: No   Sensorium  Memory: Immediate Fair; Recent Fair  Judgment: Fair  Insight: Fair   Art Therapist  Concentration: Fair  Attention Span: Poor  Recall: [employer] of Knowledge: Fair  Language: Fair   Psychomotor Activity  Psychomotor Activity: Psychomotor Activity: Normal   Assets  Assets: Desire for Improvement; Resilience   Sleep  Sleep: Sleep: Fair  Estimated Sleeping Duration (Last 24 Hours): 10.50-11.25 hours (Due to Daylight Saving Time, the durations displayed may not accurately represent documentation during the time change interval)  No data recorded  Physical Exam  Physical Exam ROS Blood pressure 112/63, pulse (!) 103, temperature 98.1 F (36.7 C), resp. rate 17, SpO2 99%. There is no height or weight on file to calculate BMI.  Treatment Plan Summary: Daily contact with patient to assess and evaluate symptoms and progress in treatment and Medication management. Continue multimodal treatment in [facility]. Maintain current medication regimen which appears effective and well tolerated but consider simplification with consolidation of quetiapine  100/300 to quetiapine  ER 400mg +.  Nitrofurantoin  course to be completed. Monitor for s/sxs of infection. Anticipate discharge to transitional placement in coming days followed by anticipated admission to [facility] on 12/1. Patient at high risk of relapse in absence of secure transition into substance treatment program.  KANDI JAYSON HAHN, MD 07/18/2024 5:33 PM

## 2024-07-18 NOTE — ED Notes (Signed)
 Pt changed her mind about signing 72 hr discharge form and ripped it up. Notified treatment team.

## 2024-07-18 NOTE — ED Notes (Signed)
Patient sleeping with no s/s of distress.

## 2024-07-18 NOTE — Group Note (Signed)
 Group Topic: Positive Affirmations  Group Date: 07/18/2024 Start Time: 1200 End Time: 1224 Facilitators: Melvenia Damien NOVAK, RN  Department: [facility]  Number of Participants: 2  Group Focus: affirmation and check in Treatment Modality:  Psychoeducation Interventions utilized were group exercise and support Purpose: express feelings and increase insight  Name: Morgan Donaldson Date of Birth: [birth year]  MR: [id number]    Level of Participation: active Quality of Participation: attentive, cooperative, and engaged Interactions with others: gave feedback Mood/Affect: appropriate Triggers (if applicable):  Cognition: coherent/clear and insightful Progress: Gaining insight Response:  Plan: follow-up needed  Patients Problems:  Patient Active Problem List   Diagnosis Date Noted   Hyperthyroidism 07/16/2024   Opioid dependence with uncomplicated intoxication (HCC) 07/14/2024   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   Elevated liver function tests 07/14/2024   Opioid use with withdrawal (HCC) 07/14/2024   Polysubstance abuse (HCC) 07/10/2024   Pyosalpingitis 02/12/2024   Hepatitis C antibody detected 02/11/2024   PID (acute pelvic inflammatory disease) 02/10/2024   Gonorrhea 12/10/2023   Group A streptococcal infection 12/09/2023   IV drug abuse (HCC) 12/09/2023   Acute cystitis 12/09/2023   GAD (generalized anxiety disorder) 06/21/2020   Attention deficit hyperactivity disorder (ADHD), 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

## 2024-07-18 NOTE — ED Notes (Signed)
 Pt A&Ox4, calm & cooperative and in NAD at this time. Denies SI/HI/AVH. Contracts for safety. Encouragement and support given. Will continue to monitor.

## 2024-07-18 NOTE — Group Note (Signed)
 Group Topic: Decisional Balance/Substance Abuse  Group Date: 07/18/2024 Start Time: 2000 End Time: 2030 Facilitators: Verdon Jacqualyn BRAVO, NT  Department: [facility]  Number of Participants: 6  Group Focus: coping skills Treatment Modality:  Patient-Centered Therapy Interventions utilized were group exercise Purpose: relapse prevention strategies  Name: Morgan Donaldson Date of Birth: 11/20/1980  MR: [id number]    Level of Participation: none Quality of Participation: n/a Interactions with others: n/a Mood/Affect: n/a Triggers (if applicable): n/a Cognition: n/a Progress: None Response: n/a Plan: patient will be encouraged to attend group  Patients Problems:  Patient Active Problem List   Diagnosis Date Noted   Hyperthyroidism 07/16/2024   Opioid dependence with uncomplicated intoxication (HCC) 07/14/2024   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   Elevated liver function tests 07/14/2024   Opioid use with withdrawal (HCC) 07/14/2024   Polysubstance abuse (HCC) 07/10/2024   Pyosalpingitis 02/12/2024   Hepatitis C antibody detected 02/11/2024   PID (acute pelvic inflammatory disease) 02/10/2024   Gonorrhea 12/10/2023   Group A streptococcal infection 12/09/2023   IV drug abuse (HCC) 12/09/2023   Acute cystitis 12/09/2023   GAD (generalized anxiety disorder) 06/21/2020   Attention deficit hyperactivity disorder (ADHD), 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

## 2024-07-18 NOTE — ED Notes (Signed)
 Pt is sleeping, no acute distress noted. Q15 safety checks in place.

## 2024-07-18 NOTE — ED Notes (Signed)
 RN spoke with patient A&Ox4. Denies intent to harm self/others when asked. Denies A/VH or any physical complaints when asked. No acute distress noted. Active listening, support and encouragement provided. Patient socializing with others while watching tv in the dayroom. Patient offered snack and given nightly ensure. Routine safety checks conducted according to facility protocol. Encouraged patient to notify staff if thoughts of harm toward self or others arise. Patient verbalize understanding and agreement.

## 2024-07-18 NOTE — Care Management (Addendum)
 [facility] Care Management...  Addendum 10/48  Writer spoke with Harlene (PSS) concerning housing. PSS will reach out to [facility] and another [employer] systems. Before coming to speak with patient   Addendum 9:16  Writer met with patient..  Per patient.. she has asked her sister and niece and both declined her staying there due to possible landlord concerns. Tristain (neice) [phone], Keli (sister) [phone]  Writer met with patient to discuss discharge planning.Scientist, Research (medical) informed patient that Harlene (PSS) will be here this morning to met with her   Per patients request  Writer reached out to patients emergency contact Verdel [phone]  Writer asked if patient could stay there until her December 1st appointment at [facility]  Per Panther he would not feel safe and doesn't want the responsibility. Verdel mentioned contacting the patients sister or niece, they are concerned about her and have been encouraging her to seek help.  Writer will met with patient, obtain numbers and reach out to patients family

## 2024-07-18 NOTE — Group Note (Signed)
 Group Topic: Relapse and Recovery  Group Date: 07/18/2024 Start Time: 1300 End Time: 1330 Facilitators: Laneta Renea POUR, NT  Department: [facility]  Number of Participants: 1  Group Focus: coping skills and problem solving Treatment Modality:  Psychoeducation Interventions utilized were patient education and problem solving Purpose: enhance coping skills  Name: Morgan Donaldson Date of Birth: 03-29-1981  MR: [id number]    Did not attend group.  Patients Problems:  Patient Active Problem List   Diagnosis Date Noted   Hyperthyroidism 07/16/2024   Opioid dependence with uncomplicated intoxication (HCC) 07/14/2024   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   Elevated liver function tests 07/14/2024   Opioid use with withdrawal (HCC) 07/14/2024   Polysubstance abuse (HCC) 07/10/2024   Pyosalpingitis 02/12/2024   Hepatitis C antibody detected 02/11/2024   PID (acute pelvic inflammatory disease) 02/10/2024   Gonorrhea 12/10/2023   Group A streptococcal infection 12/09/2023   IV drug abuse (HCC) 12/09/2023   Acute cystitis 12/09/2023   GAD (generalized anxiety disorder) 06/21/2020   Attention deficit hyperactivity disorder (ADHD), 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

## 2024-07-18 NOTE — ED Notes (Signed)
 Pt sleeping at this time. Rise and fall of chest noted. Pt in NAD at this time. Will continue to monitor.

## 2024-07-19 DIAGNOSIS — F1420 Cocaine dependence, uncomplicated: Secondary | ICD-10-CM | POA: Diagnosis not present

## 2024-07-19 DIAGNOSIS — F411 Generalized anxiety disorder: Secondary | ICD-10-CM | POA: Diagnosis not present

## 2024-07-19 DIAGNOSIS — F1924 Other psychoactive substance dependence with psychoactive substance-induced mood disorder: Secondary | ICD-10-CM | POA: Diagnosis not present

## 2024-07-19 DIAGNOSIS — F11220 Opioid dependence with intoxication, uncomplicated: Secondary | ICD-10-CM | POA: Diagnosis not present

## 2024-07-19 LAB — CBC WITH DIFFERENTIAL/PLATELET
Abs Immature Granulocytes: 0.07 K/uL (ref 0.00–0.07)
Basophils Absolute: 0.1 K/uL (ref 0.0–0.1)
Basophils Relative: 1 %
Eosinophils Absolute: 0.4 K/uL (ref 0.0–0.5)
Eosinophils Relative: 4 %
HCT: 39.1 % (ref 36.0–46.0)
Hemoglobin: 12.7 g/dL (ref 12.0–15.0)
Immature Granulocytes: 1 %
Lymphocytes Relative: 23 %
Lymphs Abs: 2.4 K/uL (ref 0.7–4.0)
MCH: 29 pg (ref 26.0–34.0)
MCHC: 32.5 g/dL (ref 30.0–36.0)
MCV: 89.3 fL (ref 80.0–100.0)
Monocytes Absolute: 0.8 K/uL (ref 0.1–1.0)
Monocytes Relative: 7 %
Neutro Abs: 6.8 K/uL (ref 1.7–7.7)
Neutrophils Relative %: 64 %
Platelets: 305 K/uL (ref 150–400)
RBC: 4.38 MIL/uL (ref 3.87–5.11)
RDW: 14 % (ref 11.5–15.5)
WBC: 10.6 K/uL — ABNORMAL HIGH (ref 4.0–10.5)
nRBC: 0 % (ref 0.0–0.2)

## 2024-07-19 LAB — URINALYSIS, ROUTINE W REFLEX MICROSCOPIC
Bilirubin Urine: NEGATIVE
Glucose, UA: NEGATIVE mg/dL
Hgb urine dipstick: NEGATIVE
Ketones, ur: NEGATIVE mg/dL
Nitrite: NEGATIVE
Protein, ur: NEGATIVE mg/dL
Specific Gravity, Urine: 1.018 (ref 1.005–1.030)
pH: 6 (ref 5.0–8.0)

## 2024-07-19 LAB — COMPREHENSIVE METABOLIC PANEL WITH GFR
ALT: 184 U/L — ABNORMAL HIGH (ref 0–44)
AST: 115 U/L — ABNORMAL HIGH (ref 15–41)
Albumin: 3.3 g/dL — ABNORMAL LOW (ref 3.5–5.0)
Alkaline Phosphatase: 72 U/L (ref 38–126)
Anion gap: 11 (ref 5–15)
BUN: 22 mg/dL — ABNORMAL HIGH (ref 6–20)
CO2: 25 mmol/L (ref 22–32)
Calcium: 8.8 mg/dL — ABNORMAL LOW (ref 8.9–10.3)
Chloride: 103 mmol/L (ref 98–111)
Creatinine, Ser: 0.78 mg/dL (ref 0.44–1.00)
GFR, Estimated: >60 mL/min (ref >60)
Glucose, Bld: 99 mg/dL (ref 70–99)
Potassium: 4 mmol/L (ref 3.5–5.1)
Sodium: 139 mmol/L (ref 135–145)
Total Bilirubin: 0.4 mg/dL (ref 0.0–1.2)
Total Protein: 7 g/dL (ref 6.5–8.1)

## 2024-07-19 MED ORDER — BUPRENORPHINE HCL-NALOXONE HCL 8-2 MG SL SUBL
1.0000 | SUBLINGUAL_TABLET | Freq: Once | SUBLINGUAL | Status: AC
  Administered 2024-07-19: 1 via SUBLINGUAL
  Filled 2024-07-19: qty 1

## 2024-07-19 NOTE — ED Notes (Signed)
 Denies SI/HI/AVH. Contracts for safety. Encouragement and support given. Will continue to monitor.

## 2024-07-19 NOTE — ED Notes (Signed)
 Pt in the dayroom watching TV and interacting with peers. She's calm and cooperative. No acute distress noted. Continue to monitor for safety.

## 2024-07-19 NOTE — ED Notes (Signed)
 Pt eating lunch in dayroom in NAD. Will continue to monitor.

## 2024-07-19 NOTE — ED Provider Notes (Signed)
 Behavioral Health Progress Note  Date and Time: 07/19/2024 1:44 PM Name: Morgan Donaldson MRN:  [id number]  Subjective:  Patient reports progressive increase in fentanyl  cravings. Several days of dreams of use followed by diurnal routine of minimal activity/boredom and nothing to think about but using. Sleep deteriorating so even at night she is preoccupied with returning to use fentanyl . Quetiapine  no longer effective. Patient has limited past experience with Suboxone  but claims trial was notable for decreased craving without euphoria.   Diagnosis:  Final diagnoses:  Polysubstance abuse (HCC)  Opioid dependence with uncomplicated intoxication (HCC)  Drug-induced mood disorder (HCC)  Elevated liver function tests  Opioid use with withdrawal (HCC)  Hyperthyroidism  Generalized anxiety disorder   Total Time spent with patient: I personally spent 20 minutes on the unit in direct patient care. The direct patient care time included face-to-face time with the patient, reviewing the patient's chart, communicating with other professionals, and coordinating care. Greater than 50% of this time was spent in counseling or coordinating care with the patient regarding goals of hospitalization, psycho-education, and discharge planning needs.   On my assessment the patient denied SI, HI, AVH, paranoia, ideas of reference, or first rank symptoms. Patient endorses intensifying specific drug cravings. Patient denied medication side-effects but reporting reduced effectiveness. Patient was not deemed to be a danger to self or others and was in agreement with discharge plans as of today but clearly experiencing a strong desire to resume substance use.   Provider Admission HPI: Morgan Donaldson is  a 43 year old female who presented  to [facility] with a history of Opioid Use Disorder, severe and Stimulant Use Disorder, cocaine type, severe along with untreated Bipolar Disorder. Pt was transferred to Facility Based Crisis unit  for detox from Fentanyl  and crack cocaine. UDS positive for cocaine, morphine  and THC. BAL upon admission is negative.    Patient reported she has been using 1/2 gram of fentanyl  IV for the past year.  She has also been smoking approximately 1/2 gram of crack cocaine daily for the past year.  Patient stated she is done with using, stating she had this realization when she woke up [place] a bridge this morning.  Patient has been homeless for the past year.  She reported  she has a boyfriend, and things are going well outside of patient's substance use issues.  Patient stated her boyfriend is supportive of her seeking treatment and will allow her to stay with him once she is clean and has completed treatment.  Patient reported  she is also estranged from her sister and other family members due to her ongoing issues with substance use.  She is hopeful that after treatment she can reconnect with family.   Sleep: Poor  Appetite:  Fair  Current Medications:  Current Facility-Administered Medications  Medication Dose Route Frequency Provider Last Rate Last Admin   acetaminophen  (TYLENOL ) tablet 650 mg  650 mg Oral Q6H PRN Randall Starlyn HERO, NP   650 mg at 07/13/24 0836   alum & mag hydroxide-simeth (MAALOX/MYLANTA) 200-200-20 MG/5ML suspension 30 mL  30 mL Oral Q4H PRN Onuoha, Chinwendu V, NP   30 mL at 07/15/24 2106   buprenorphine -naloxone  (SUBOXONE ) 8-2 mg per SL tablet 1 tablet  1 tablet Sublingual Once Cole Kandi BROCKS, MD       busPIRone  (BUSPAR ) tablet 20 mg  20 mg Oral BID Melvin Marmo C, MD   20 mg at 07/19/24 0908   haloperidol  (HALDOL ) tablet 5 mg  5 mg Oral TID PRN Byungura, Veronique M, NP   5 mg at 07/18/24 8371   And   diphenhydrAMINE  (BENADRYL ) capsule 50 mg  50 mg Oral TID PRN Randall Starlyn HERO, NP   50 mg at 07/18/24 1628   haloperidol  lactate (HALDOL ) injection 5 mg  5 mg Intramuscular TID PRN Randall Starlyn HERO, NP       And   diphenhydrAMINE  (BENADRYL ) injection  50 mg  50 mg Intramuscular TID PRN Randall Starlyn HERO, NP       And   LORazepam  (ATIVAN ) injection 2 mg  2 mg Intramuscular TID PRN Randall Starlyn HERO, NP       haloperidol  lactate (HALDOL ) injection 10 mg  10 mg Intramuscular TID PRN Randall Starlyn HERO, NP       And   diphenhydrAMINE  (BENADRYL ) injection 50 mg  50 mg Intramuscular TID PRN Randall Starlyn HERO, NP       And   LORazepam  (ATIVAN ) injection 2 mg  2 mg Intramuscular TID PRN Randall, Veronique M, NP       feeding supplement (ENSURE PLUS HIGH PROTEIN) liquid 237 mL  237 mL Oral TID BM Gottfried, Rhoda J, MD   237 mL at 07/19/24 0908   magnesium  hydroxide (MILK OF MAGNESIA) suspension 30 mL  30 mL Oral Daily PRN Randall Starlyn HERO, NP   30 mL at 07/18/24 1259   nicotine  (NICODERM CQ  - dosed in mg/24 hours) patch 21 mg  21 mg Transdermal Daily Byungura, Veronique M, NP   21 mg at 07/19/24 9091   nicotine  polacrilex (NICORETTE ) gum 2 mg  2 mg Oral PRN Gottfried, Rhoda J, MD   2 mg at 07/15/24 1813   QUEtiapine  (SEROQUEL ) tablet 100 mg  100 mg Oral Landrum Cole Salles C, MD   100 mg at 07/19/24 0908   QUEtiapine  (SEROQUEL ) tablet 300 mg  300 mg Oral QHS Heena Woodbury C, MD   300 mg at 07/18/24 2107   traZODone  (DESYREL ) tablet 100 mg  100 mg Oral QHS PRN Randall Starlyn HERO, NP   100 mg at 07/18/24 2107   No current outpatient medications on file.    Labs  Lab Results:  Admission on 07/10/2024  Component Date Value Ref Range Status   T3, Free 07/11/2024 2.2  2.0 - 4.4 pg/mL Final   Comment: (NOTE) Performed At: [facility] [address] [place], [place] [id number] Jennette Shorter MD Ey:[id number]    Free T4 07/11/2024 0.71  0.61 - 1.12 ng/dL Final   Comment: (NOTE) Biotin ingestion may interfere with free T4 tests. If the results are inconsistent with the TSH level, previous test results, or the clinical presentation, then consider biotin interference. If needed, order repeat testing after  stopping biotin. Performed at [facility], [address] [place], [place] [address]    Neisseria Gonorrhea 07/12/2024 Negative   Final   Chlamydia 07/12/2024 Negative   Final   Comment 07/12/2024 Normal Reference Ranger Chlamydia - Negative   Final   Comment 07/12/2024 Normal Reference Range Neisseria Gonorrhea - Negative   Final   Total Protein 07/12/2024 7.1  6.5 - 8.1 g/dL Final   Albumin 88/84/7974 3.3 (L)  3.5 - 5.0 g/dL Final   AST 88/84/7974 67 (H)  15 - 41 U/L Final   ALT 07/12/2024 95 (H)  0 - 44 U/L Final   Alkaline Phosphatase 07/12/2024 110  38 - 126 U/L Final   Total Bilirubin 07/12/2024 0.6  0.0 -  1.2 mg/dL Final   Bilirubin, Direct 07/12/2024 <0.1  0.0 - 0.2 mg/dL Final   Indirect Bilirubin 07/12/2024 NOT CALCULATED  0.3 - 0.9 mg/dL Final   Performed at [facility], [address], [place], [place] [address]   RPR Ser Ql 07/12/2024 NON REACTIVE  NON REACTIVE Final   Performed at [facility], [address] [place], [place] [address]   [facility] test code 07/12/2024 [id number]   Final   [facility] test name 07/12/2024 [id number]   Final   Source ([facility]) 07/12/2024 SERUM   Final   Performed at [facility], [address] [place], [place] [address]   Misc [facility] result 07/12/2024 COMMENT   Final   Comment: (NOTE) Test Ordered: [id number] HIV Ab/p24 Ag with Reflex HIV Ab/p24 Ag Screen           Note:                     BN     Non Reactive                                                  Reference Range: Non Reactive                          HIV-1/HIV-2 antibodies and HIV-1 p24 antigen were NOT detected. There is no laboratory evidence of HIV infection. HIV Negative Performed At: [facility] [address] [place], [place] [id number] Jennette Shorter MD Ey:[id number]   Admission on 07/08/2024, Discharged on 07/10/2024  Component Date Value Ref Range Status   WBC 07/08/2024 10.6 (H)  4.0 - 10.5 K/uL Final   RBC 07/08/2024 5.65 (H)  3.87 - 5.11  MIL/uL Final   Hemoglobin 07/08/2024 16.5 (H)  12.0 - 15.0 g/dL Final   HCT 88/88/7974 49.4 (H)  36.0 - 46.0 % Final   MCV 07/08/2024 87.4  80.0 - 100.0 fL Final   MCH 07/08/2024 29.2  26.0 - 34.0 pg Final   MCHC 07/08/2024 33.4  30.0 - 36.0 g/dL Final   RDW 88/88/7974 13.7  11.5 - 15.5 % Final   Platelets 07/08/2024 422 (H)  150 - 400 K/uL Final   nRBC 07/08/2024 0.0  0.0 - 0.2 % Final   Neutrophils Relative % 07/08/2024 75  % Final   Neutro Abs 07/08/2024 7.9 (H)  1.7 - 7.7 K/uL Final   Lymphocytes Relative 07/08/2024 17  % Final   Lymphs Abs 07/08/2024 1.8  0.7 - 4.0 K/uL Final   Monocytes Relative 07/08/2024 7  % Final   Monocytes Absolute 07/08/2024 0.7  0.1 - 1.0 K/uL Final   Eosinophils Relative 07/08/2024 0  % Final   Eosinophils Absolute 07/08/2024 0.0  0.0 - 0.5 K/uL Final   Basophils Relative 07/08/2024 1  % Final   Basophils Absolute 07/08/2024 0.1  0.0 - 0.1 K/uL Final   Immature Granulocytes 07/08/2024 0  % Final   Abs Immature Granulocytes 07/08/2024 0.04  0.00 - 0.07 K/uL Final   Performed at [facility], [address] [place], [place] [address]   Sodium 07/08/2024 140  135 - 145 mmol/L Final   Potassium 07/08/2024 4.1  3.5 - 5.1 mmol/L Final   Chloride 07/08/2024 100  98 - 111 mmol/L Final  CO2 07/08/2024 27  22 - 32 mmol/L Final   Glucose, Bld 07/08/2024 113 (H)  70 - 99 mg/dL Final   Glucose reference range applies only to samples taken after fasting for at least 8 hours.   BUN 07/08/2024 10  6 - 20 mg/dL Final   Creatinine, Ser 07/08/2024 0.74  0.44 - 1.00 mg/dL Final   Calcium 88/88/7974 9.9  8.9 - 10.3 mg/dL Final   Total Protein 88/88/7974 7.9  6.5 - 8.1 g/dL Final   Albumin 88/88/7974 3.5  3.5 - 5.0 g/dL Final   AST 88/88/7974 109 (H)  15 - 41 U/L Final   ALT 07/08/2024 132 (H)  0 - 44 U/L Final   Alkaline Phosphatase 07/08/2024 74  38 - 126 U/L Final   Total Bilirubin 07/08/2024 0.3  0.0 - 1.2 mg/dL Final   GFR, Estimated 07/08/2024 >60  >60  mL/min Final   Comment: (NOTE) Calculated using the CKD-EPI Creatinine Equation (2021)    Anion gap 07/08/2024 13  5 - 15 Final   Performed at [facility], [address], [place], [place] [address]   Hgb A1c MFr Bld 07/08/2024 5.5  4.8 - 5.6 % Final   Comment: (NOTE) Diagnosis of Diabetes The following HbA1c ranges recommended by the American Diabetes Association (ADA) may be used as an aid in the diagnosis of diabetes mellitus.  Hemoglobin             Suggested A1C NGSP%              Diagnosis  <5.7                   Non Diabetic  5.7-6.4                Pre-Diabetic  >6.4                   Diabetic  <7.0                   Glycemic control for                       adults with diabetes.     Mean Plasma Glucose 07/08/2024 111.15  mg/dL Final   Performed at [facility], [address], [place], [place] [address]   Magnesium  07/08/2024 2.1  1.7 - 2.4 mg/dL Final   Performed at [facility], [address] [place], [place] [address]   Alcohol, Ethyl (B) 07/08/2024 <15  <15 mg/dL Final   Comment: (NOTE) For medical purposes only. Performed at [facility], [address] [place], [place] [address]    Cholesterol 07/08/2024 174  0 - 200 mg/dL Final   Triglycerides 88/88/7974 102  <150 mg/dL Final   HDL 88/88/7974 42  >40 mg/dL Final   Total CHOL/HDL Ratio 07/08/2024 4.1  RATIO Final   VLDL 07/08/2024 20  0 - 40 mg/dL Final   LDL Cholesterol 07/08/2024 112 (H)  0 - 99 mg/dL Final   Comment:        Total Cholesterol/HDL:CHD Risk Coronary Heart Disease Risk Table                     Men   Women  1/2 Average Risk   3.4   3.3  Average Risk       5.0   4.4  2 X Average Risk   9.6  7.1  3 X Average Risk  23.4   11.0        Use the calculated Patient Ratio above and the CHD Risk Table to determine the patient's CHD Risk.        ATP III CLASSIFICATION (LDL):  <100     mg/dL   Optimal  899-870  mg/dL   Near or Above                    Optimal   130-159  mg/dL   Borderline  839-810  mg/dL   High  >809     mg/dL   Very High Performed at [facility], [address], [place], [place] [address]    Color, Urine 07/09/2024 YELLOW  YELLOW Final   APPearance 07/09/2024 TURBID (A)  CLEAR Final   Specific Gravity, Urine 07/09/2024 1.017  1.005 - 1.030 Final   pH 07/09/2024 7.0  5.0 - 8.0 Final   Glucose, UA 07/09/2024 NEGATIVE  NEGATIVE mg/dL Final   Hgb urine dipstick 07/09/2024 NEGATIVE  NEGATIVE Final   Bilirubin Urine 07/09/2024 NEGATIVE  NEGATIVE Final   Ketones, ur 07/09/2024 NEGATIVE  NEGATIVE mg/dL Final   Protein, ur 88/87/7974 NEGATIVE  NEGATIVE mg/dL Final   Nitrite 88/87/7974 NEGATIVE  NEGATIVE Final   Leukocytes,Ua 07/09/2024 MODERATE (A)  NEGATIVE Final   RBC / HPF 07/09/2024 0-5  0 - 5 RBC/hpf Final   WBC, UA 07/09/2024 21-50  0 - 5 WBC/hpf Final   Bacteria, UA 07/09/2024 MANY (A)  NONE SEEN Final   Squamous Epithelial / HPF 07/09/2024 0-5  0 - 5 /HPF Final   WBC Clumps 07/09/2024 PRESENT   Final   Performed at [facility], [address], [place], [place] [address]   Preg Test, Ur 07/09/2024 Negative  Negative Final   POC Amphetamine  UR 07/09/2024 None Detected  NONE DETECTED (Cut Off Level 1000 ng/mL) Final   POC Secobarbital (BAR) 07/09/2024 None Detected  NONE DETECTED (Cut Off Level 300 ng/mL) Final   POC Buprenorphine  (BUP) 07/09/2024 None Detected  NONE DETECTED (Cut Off Level 10 ng/mL) Final   POC Oxazepam (BZO) 07/09/2024 None Detected  NONE DETECTED (Cut Off Level 300 ng/mL) Final   POC Cocaine UR 07/09/2024 Positive (A)  NONE DETECTED (Cut Off Level 300 ng/mL) Final   POC Methamphetamine UR 07/09/2024 None Detected  NONE DETECTED (Cut Off Level 1000 ng/mL) Final   POC Morphine  07/09/2024 Positive (A)  NONE DETECTED (Cut Off Level 300 ng/mL) Final   POC Methadone UR 07/09/2024 None Detected  NONE DETECTED (Cut Off Level 300 ng/mL) Final   POC Oxycodone  UR 07/09/2024 None Detected  NONE DETECTED  (Cut Off Level 100 ng/mL) Final   POC Marijuana UR 07/09/2024 Positive (A)  NONE DETECTED (Cut Off Level 50 ng/mL) Final   TSH 07/08/2024 0.112 (L)  0.350 - 4.500 uIU/mL Final   Comment: Performed by a 3rd Generation assay with a functional sensitivity of <=0.01 uIU/mL. Performed at [facility], [address] [place], [place] [address]    Preg Test, Ur 07/09/2024 NEGATIVE  NEGATIVE Final   Comment:        THE SENSITIVITY OF THIS METHODOLOGY IS >20 mIU/mL.   Admission on 05/17/2024, Discharged on 05/18/2024  Component Date Value Ref Range Status   WBC 05/17/2024 9.0  4.0 - 10.5 K/uL Final   RBC 05/17/2024 4.35  3.87 - 5.11 MIL/uL Final   Hemoglobin 05/17/2024 12.8  12.0 -  15.0 g/dL Final   HCT 90/79/7974 39.7  36.0 - 46.0 % Final   MCV 05/17/2024 91.3  80.0 - 100.0 fL Final   MCH 05/17/2024 29.4  26.0 - 34.0 pg Final   MCHC 05/17/2024 32.2  30.0 - 36.0 g/dL Final   RDW 90/79/7974 14.1  11.5 - 15.5 % Final   Platelets 05/17/2024 286  150 - 400 K/uL Final   nRBC 05/17/2024 0.0  0.0 - 0.2 % Final   Neutrophils Relative % 05/17/2024 65  % Final   Neutro Abs 05/17/2024 5.8  1.7 - 7.7 K/uL Final   Lymphocytes Relative 05/17/2024 23  % Final   Lymphs Abs 05/17/2024 2.1  0.7 - 4.0 K/uL Final   Monocytes Relative 05/17/2024 8  % Final   Monocytes Absolute 05/17/2024 0.8  0.1 - 1.0 K/uL Final   Eosinophils Relative 05/17/2024 4  % Final   Eosinophils Absolute 05/17/2024 0.3  0.0 - 0.5 K/uL Final   Basophils Relative 05/17/2024 0  % Final   Basophils Absolute 05/17/2024 0.0  0.0 - 0.1 K/uL Final   Immature Granulocytes 05/17/2024 0  % Final   Abs Immature Granulocytes 05/17/2024 0.04  0.00 - 0.07 K/uL Final   Performed at [facility], [address] [place], [place] [address]   Preg, Serum 05/17/2024 NEGATIVE  NEGATIVE Final   Comment:        THE SENSITIVITY OF THIS METHODOLOGY IS >10 mIU/mL. Performed at [facility], [address] [place], [place] [address]     Sodium 05/17/2024 141  135 - 145 mmol/L Final   Potassium 05/17/2024 3.5  3.5 - 5.1 mmol/L Final   Chloride 05/17/2024 104  98 - 111 mmol/L Final   CO2 05/17/2024 26  22 - 32 mmol/L Final   Glucose, Bld 05/17/2024 118 (H)  70 - 99 mg/dL Final   Glucose reference range applies only to samples taken after fasting for at least 8 hours.   BUN 05/17/2024 11  6 - 20 mg/dL Final   Creatinine, Ser 05/17/2024 0.78  0.44 - 1.00 mg/dL Final   Calcium 90/79/7974 8.8 (L)  8.9 - 10.3 mg/dL Final   Total Protein 90/79/7974 7.0  6.5 - 8.1 g/dL Final   Albumin 90/79/7974 3.0 (L)  3.5 - 5.0 g/dL Final   AST 90/79/7974 115 (H)  15 - 41 U/L Final   ALT 05/17/2024 144 (H)  0 - 44 U/L Final   Alkaline Phosphatase 05/17/2024 71  38 - 126 U/L Final   Total Bilirubin 05/17/2024 0.5  0.0 - 1.2 mg/dL Final   GFR, Estimated 05/17/2024 >60  >60 mL/min Final   Comment: (NOTE) Calculated using the CKD-EPI Creatinine Equation (2021)    Anion gap 05/17/2024 11  5 - 15 Final   Performed at [facility], [address] [place], [place] [address]   Sodium 05/17/2024 142  135 - 145 mmol/L Final   Potassium 05/17/2024 3.5  3.5 - 5.1 mmol/L Final   Chloride 05/17/2024 103  98 - 111 mmol/L Final   BUN 05/17/2024 12  6 - 20 mg/dL Final   Creatinine, Ser 05/17/2024 0.70  0.44 - 1.00 mg/dL Final   Glucose, Bld 90/79/7974 120 (H)  70 - 99 mg/dL Final   Glucose reference range applies only to samples taken after fasting for at least 8 hours.   Calcium, Ion 05/17/2024 1.13 (L)  1.15 - 1.40 mmol/L Final   TCO2 05/17/2024 28  22 -  32 mmol/L Final   Hemoglobin 05/17/2024 11.6 (L)  12.0 - 15.0 g/dL Final   HCT 90/79/7974 34.0 (L)  36.0 - 46.0 % Final  Admission on 03/25/2024, Discharged on 03/25/2024  Component Date Value Ref Range Status   Sodium 03/25/2024 140  135 - 145 mmol/L Final   Potassium 03/25/2024 3.2 (L)  3.5 - 5.1 mmol/L Final   Chloride 03/25/2024 105  98 - 111 mmol/L Final   BUN 03/25/2024 9  6 - 20 mg/dL  Final   Creatinine, Ser 03/25/2024 0.70  0.44 - 1.00 mg/dL Final   Glucose, Bld 92/70/7974 132 (H)  70 - 99 mg/dL Final   Glucose reference range applies only to samples taken after fasting for at least 8 hours.   Calcium, Ion 03/25/2024 0.99 (L)  1.15 - 1.40 mmol/L Final   TCO2 03/25/2024 24  22 - 32 mmol/L Final   Hemoglobin 03/25/2024 12.9  12.0 - 15.0 g/dL Final   HCT 92/70/7974 38.0  36.0 - 46.0 % Final   Lactic Acid, Venous 03/25/2024 3.2 (HH)  0.5 - 1.9 mmol/L Final   Comment 03/25/2024 NOTIFIED PHYSICIAN   Final  Admission on 02/10/2024, Discharged on 02/12/2024  Component Date Value Ref Range Status   WBC 02/10/2024 16.8 (H)  4.0 - 10.5 K/uL Final   RBC 02/10/2024 4.46  3.87 - 5.11 MIL/uL Final   Hemoglobin 02/10/2024 12.7  12.0 - 15.0 g/dL Final   HCT 93/84/7974 39.5  36.0 - 46.0 % Final   MCV 02/10/2024 88.6  80.0 - 100.0 fL Final   MCH 02/10/2024 28.5  26.0 - 34.0 pg Final   MCHC 02/10/2024 32.2  30.0 - 36.0 g/dL Final   RDW 93/84/7974 14.8  11.5 - 15.5 % Final   Platelets 02/10/2024 331  150 - 400 K/uL Final   nRBC 02/10/2024 0.0  0.0 - 0.2 % Final   Neutrophils Relative % 02/10/2024 83  % Final   Neutro Abs 02/10/2024 14.0 (H)  1.7 - 7.7 K/uL Final   Lymphocytes Relative 02/10/2024 9  % Final   Lymphs Abs 02/10/2024 1.6  0.7 - 4.0 K/uL Final   Monocytes Relative 02/10/2024 6  % Final   Monocytes Absolute 02/10/2024 1.0  0.1 - 1.0 K/uL Final   Eosinophils Relative 02/10/2024 1  % Final   Eosinophils Absolute 02/10/2024 0.1  0.0 - 0.5 K/uL Final   Basophils Relative 02/10/2024 0  % Final   Basophils Absolute 02/10/2024 0.1  0.0 - 0.1 K/uL Final   Immature Granulocytes 02/10/2024 1  % Final   Abs Immature Granulocytes 02/10/2024 0.08 (H)  0.00 - 0.07 K/uL Final   Performed at [facility], [address] [place], [place] [address]   Sodium 02/10/2024 136  135 - 145 mmol/L Final   Potassium 02/10/2024 3.9  3.5 - 5.1 mmol/L Final   Chloride 02/10/2024 101  98 -  111 mmol/L Final   CO2 02/10/2024 25  22 - 32 mmol/L Final   Glucose, Bld 02/10/2024 94  70 - 99 mg/dL Final   Glucose reference range applies only to samples taken after fasting for at least 8 hours.   BUN 02/10/2024 7  6 - 20 mg/dL Final   Creatinine, Ser 02/10/2024 0.66  0.44 - 1.00 mg/dL Final   Calcium 93/84/7974 8.7 (L)  8.9 - 10.3 mg/dL Final   Total Protein 93/84/7974 6.8  6.5 - 8.1 g/dL Final   Albumin 93/84/7974 3.2 (L)  3.5 - 5.0 g/dL  Final   AST 02/10/2024 215 (H)  15 - 41 U/L Final   ALT 02/10/2024 143 (H)  0 - 44 U/L Final   Alkaline Phosphatase 02/10/2024 82  38 - 126 U/L Final   Total Bilirubin 02/10/2024 1.0  0.0 - 1.2 mg/dL Final   GFR, Estimated 02/10/2024 >60  >60 mL/min Final   Comment: (NOTE) Calculated using the CKD-EPI Creatinine Equation (2021)    Anion gap 02/10/2024 10  5 - 15 Final   Performed at [facility], [address] [place], [place] [address]   Lipase 02/10/2024 25  11 - 51 U/L Final   Performed at [facility], [address] [place], [place] [address]   Specimen Source 02/10/2024 URINE, CLEAN CATCH   Final   Color, Urine 02/10/2024 YELLOW  YELLOW Final   APPearance 02/10/2024 CLOUDY (A)  CLEAR Final   Specific Gravity, Urine 02/10/2024 1.015  1.005 - 1.030 Final   pH 02/10/2024 7.0  5.0 - 8.0 Final   Glucose, UA 02/10/2024 NEGATIVE  NEGATIVE mg/dL Final   Hgb urine dipstick 02/10/2024 NEGATIVE  NEGATIVE Final   Bilirubin Urine 02/10/2024 NEGATIVE  NEGATIVE Final   Ketones, ur 02/10/2024 NEGATIVE  NEGATIVE mg/dL Final   Protein, ur 93/84/7974 30 (A)  NEGATIVE mg/dL Final   Nitrite 93/84/7974 POSITIVE (A)  NEGATIVE Final   Leukocytes,Ua 02/10/2024 MODERATE (A)  NEGATIVE Final   RBC / HPF 02/10/2024 0-5  0 - 5 RBC/hpf Final   WBC, UA 02/10/2024 21-50  0 - 5 WBC/hpf Final   Comment:        Reflex urine culture not performed if WBC <=10, OR if Squamous epithelial cells >5. If Squamous epithelial cells >5 suggest recollection.     Bacteria, UA 02/10/2024 FEW (A)  NONE SEEN Final   Squamous Epithelial / HPF 02/10/2024 0-5  0 - 5 /HPF Final   Mucus 02/10/2024 PRESENT   Final   Amorphous Crystal 02/10/2024 PRESENT   Final   Performed at [facility], [address], [place], [place] [address]   Preg Test, Ur 02/10/2024 NEGATIVE  NEGATIVE Final   Comment:        THE SENSITIVITY OF THIS METHODOLOGY IS >25 mIU/mL. Performed at [facility], [address], [place], [place] [address]    Specimen Description 02/10/2024 BLOOD RIGHT FOREARM   Final   Special Requests 02/10/2024 BOTTLES DRAWN AEROBIC AND ANAEROBIC Blood Culture adequate volume   Final   Culture 02/10/2024    Final                   Value:NO GROWTH 5 DAYS Performed at [facility], [address] [place], [place] [address]    Report Status 02/10/2024 02/15/2024 FINAL   Final   Specimen Description 02/10/2024 BLOOD LEFT ANTECUBITAL   Final   Special Requests 02/10/2024 BOTTLES DRAWN AEROBIC AND ANAEROBIC Blood Culture results may not be optimal due to an inadequate volume of blood received in culture bottles   Final   Culture 02/10/2024    Final                   Value:NO GROWTH 5 DAYS Performed at [facility], [address], [place], [place] [address]    Report Status 02/10/2024 02/15/2024 FINAL   Final   Neisseria Gonorrhea 02/10/2024 Positive (A)   Final   Chlamydia 02/10/2024 Negative   Final   Comment 02/10/2024 Normal  Reference Ranger Chlamydia - Negative   Final   Comment 02/10/2024 Normal Reference Range Neisseria Gonorrhea - Negative   Final   Yeast Wet Prep HPF POC 02/10/2024 NONE SEEN  NONE SEEN Final   Trich, Wet Prep 02/10/2024 NONE SEEN  NONE SEEN Final   Clue Cells Wet Prep HPF POC 02/10/2024 NONE SEEN  NONE SEEN Final   WBC, Wet Prep HPF POC 02/10/2024 >=10 (A)  <10 Final   Sperm 02/10/2024 NONE SEEN   Final   Performed at [facility], [address] [place], [place] [address]   HIV Screen 4th Generation  wRfx 02/10/2024 Non Reactive  Non Reactive Final   Performed at [facility], [address] [place], [place] [address]   RPR Ser Ql 02/10/2024 NON REACTIVE  NON REACTIVE Final   Performed at [facility], [address] [place], [place] [address]   Hepatitis B Surface Ag 02/10/2024 NON REACTIVE  NON REACTIVE Final   HCV Ab 02/10/2024 Reactive (A)  NON REACTIVE Final   Comment: (NOTE) The CDC recommends that a Reactive HCV antibody result be followed up  with a HCV Nucleic Acid Amplification test.     Hep A IgM 02/10/2024 NON REACTIVE  NON REACTIVE Final   Hep B C IgM 02/10/2024 NON REACTIVE  NON REACTIVE Final   Performed at [facility], [address] [place], [place] [address]   Specimen Description 02/10/2024 URINE, RANDOM   Final   Special Requests 02/10/2024    Final                   Value:NONE Reflexed from [id number] Performed at [facility], [address], [place], [place] [address]    Culture 02/10/2024 MULTIPLE SPECIES PRESENT, SUGGEST RECOLLECTION (A)   Final   Report Status 02/10/2024 02/11/2024 FINAL   Final   WBC 02/11/2024 17.9 (H)  4.0 - 10.5 K/uL Final   RBC 02/11/2024 4.32  3.87 - 5.11 MIL/uL Final   Hemoglobin 02/11/2024 12.4  12.0 - 15.0 g/dL Final   HCT 93/83/7974 38.1  36.0 - 46.0 % Final   MCV 02/11/2024 88.2  80.0 - 100.0 fL Final   MCH 02/11/2024 28.7  26.0 - 34.0 pg Final   MCHC 02/11/2024 32.5  30.0 - 36.0 g/dL Final   RDW 93/83/7974 14.9  11.5 - 15.5 % Final   Platelets 02/11/2024 328  150 - 400 K/uL Final   nRBC 02/11/2024 0.0  0.0 - 0.2 % Final   Performed at [facility], [address] [place], [place] [address]   Sodium 02/11/2024 134 (L)  135 - 145 mmol/L Final   Potassium 02/11/2024 4.1  3.5 - 5.1 mmol/L Final   Chloride 02/11/2024 103  98 - 111 mmol/L Final   CO2 02/11/2024 23  22 - 32 mmol/L Final   Glucose, Bld 02/11/2024 245 (H)  70 - 99 mg/dL Final   Glucose reference range applies only to samples taken  after fasting for at least 8 hours.   BUN 02/11/2024 9  6 - 20 mg/dL Final   Creatinine, Ser 02/11/2024 0.76  0.44 - 1.00 mg/dL Final   Calcium 93/83/7974 7.9 (L)  8.9 - 10.3 mg/dL Final   Total Protein 93/83/7974 5.7 (L)  6.5 - 8.1 g/dL Final   Albumin 93/83/7974 2.3 (L)  3.5 - 5.0 g/dL Final   AST 93/83/7974 109 (H)  15 - 41 U/L Final  ALT 02/11/2024 95 (H)  0 - 44 U/L Final   Alkaline Phosphatase 02/11/2024 72  38 - 126 U/L Final   Total Bilirubin 02/11/2024 0.7  0.0 - 1.2 mg/dL Final   GFR, Estimated 02/11/2024 >60  >60 mL/min Final   Comment: (NOTE) Calculated using the CKD-EPI Creatinine Equation (2021)    Anion gap 02/11/2024 8  5 - 15 Final   Performed at [facility], [address] [place], [place] [address]   HCV RNA Qnt(log copy/mL) 02/11/2024 6.778  log10 IU/mL Corrected   HepC Qn 02/11/2024 6,000,000  IU/mL Final   Test Information 02/11/2024 Comment   Final   Comment: (NOTE) The quantitative range of this assay is 15 IU/mL to 100 million IU/mL.    Hcv Genotype 02/11/2024 Comment   Final   Comment: (NOTE) To be performed on this specimen. Performed At: [facility] [address] [place], [place] [id number] Jennette Shorter MD Ey:[id number]    aPTT 02/11/2024 37 (H)  24 - 36 seconds Final   Comment:        IF BASELINE aPTT IS ELEVATED, SUGGEST PATIENT RISK ASSESSMENT BE USED TO DETERMINE APPROPRIATE ANTICOAGULANT THERAPY. Performed at [facility], [address], [place], [place] [address]    Prothrombin Time 02/11/2024 15.4 (H)  11.4 - 15.2 seconds Final   INR 02/11/2024 1.2  0.8 - 1.2 Final   Comment: (NOTE) INR goal varies based on device and disease states. Performed at [facility], [address], [place], [place] [address]    Fibrinogen  02/11/2024 524 (H)  210 - 475 mg/dL Final   Comment: (NOTE) Fibrinogen  results may be underestimated in patients receiving thrombolytic therapy. Performed at [facility], [address], [place], [place] [address]    TSH 02/11/2024 0.373  0.350 - 4.500 uIU/mL Final   Comment: Performed by a 3rd Generation assay with a functional sensitivity of <=0.01 uIU/mL. Performed at [facility], [address] [place], [place] [address]    WBC 02/12/2024 9.0  4.0 - 10.5 K/uL Final   RBC 02/12/2024 4.32  3.87 - 5.11 MIL/uL Final   Hemoglobin 02/12/2024 12.6  12.0 - 15.0 g/dL Final   HCT 93/82/7974 38.4  36.0 - 46.0 % Final   MCV 02/12/2024 88.9  80.0 - 100.0 fL Final   MCH 02/12/2024 29.2  26.0 - 34.0 pg Final   MCHC 02/12/2024 32.8  30.0 - 36.0 g/dL Final   RDW 93/82/7974 14.9  11.5 - 15.5 % Final   Platelets 02/12/2024 340  150 - 400 K/uL Final   nRBC 02/12/2024 0.0  0.0 - 0.2 % Final   Performed at [facility], [address] [place], [place] [address]   Sodium 02/12/2024 138  135 - 145 mmol/L Final   Potassium 02/12/2024 3.9  3.5 - 5.1 mmol/L Final   Chloride 02/12/2024 106  98 - 111 mmol/L Final   CO2 02/12/2024 22  22 - 32 mmol/L Final   Glucose, Bld 02/12/2024 147 (H)  70 - 99 mg/dL Final   Glucose reference range applies only to samples taken after fasting for at least 8 hours.   BUN 02/12/2024 10  6 - 20 mg/dL Final   Creatinine, Ser 02/12/2024 0.66  0.44 - 1.00 mg/dL Final   Calcium 93/82/7974 8.2 (L)  8.9 - 10.3 mg/dL Final   Total Protein 93/82/7974 5.9 (L)  6.5 - 8.1 g/dL Final   Albumin 93/82/7974  2.2 (L)  3.5 - 5.0 g/dL Final   AST 93/82/7974 133 (H)  15 - 41 U/L Final   ALT 02/12/2024 93 (H)  0 - 44 U/L Final   Alkaline Phosphatase 02/12/2024 63  38 - 126 U/L Final   Total Bilirubin 02/12/2024 0.4  0.0 - 1.2 mg/dL Final   GFR, Estimated 02/12/2024 >60  >60 mL/min Final   Comment: (NOTE) Calculated using the CKD-EPI Creatinine Equation (2021)    Anion gap 02/12/2024 10  5 - 15 Final   Performed at [facility], [address], [place], [place] [address]   Hgb A1c MFr Bld 02/12/2024 5.4  4.8 - 5.6 % Final   Comment:  (NOTE) Diagnosis of Diabetes The following HbA1c ranges recommended by the American Diabetes Association (ADA) may be used as an aid in the diagnosis of diabetes mellitus.  Hemoglobin             Suggested A1C NGSP%              Diagnosis  <5.7                   Non Diabetic  5.7-6.4                Pre-Diabetic  >6.4                   Diabetic  <7.0                   Glycemic control for                       adults with diabetes.     Mean Plasma Glucose 02/12/2024 108.28  mg/dL Final   Performed at [facility], [address] [place], [place] [address]   Hepatitis C Genotype 02/11/2024 1a   Final   Comment: (NOTE) This test was developed and its performance characteristics determined by [facility]. It has not been cleared or approved by the Food and Drug Administration. Performed At: [facility] [address] [place], [place] [id number] Jennette Shorter MD Ey:[id number]     Blood Alcohol level:  Lab Results  Component Value Date   [facility] <15 07/08/2024    Metabolic Disorder Labs: Lab Results  Component Value Date   HGBA1C 5.5 07/08/2024   MPG 111.15 07/08/2024   MPG 108.28 02/12/2024   No results found for: PROLACTIN Lab Results  Component Value Date   CHOL 174 07/08/2024   TRIG 102 07/08/2024   HDL 42 07/08/2024   CHOLHDL 4.1 07/08/2024   VLDL 20 07/08/2024   LDLCALC 112 (H) 07/08/2024   LDLCALC 73 09/22/2020    Therapeutic Lab Levels: No results found for: LITHIUM No results found for: VALPROATE No results found for: CBMZ  Physical Findings   GAD-7    Flowsheet Row Procedure visit from 11/01/2020 in Center for [facility] at [facility] Office Visit from 09/22/2020 in Center for [facility] at [facility] Clinical Support from 06/21/2020 in [facility] for [facility] at [facility] Clinical Support from 04/05/2020 in [facility] for [facility] at [facility] Clinical Support from 01/19/2020 in Center for [facility] at [facility]  Total GAD-7 Score 7 2 10 8  0   PHQ2-9    Flowsheet Row ED from 07/10/2024 in [facility] ED from  07/08/2024 in [facility] Procedure visit from 11/01/2020 in Center for [facility] at [facility] Office Visit from 09/22/2020 in Center for [employer] at [facility] Clinical Support from 06/21/2020 in Center for [facility] at [facility]  PHQ-2 Total Score 6 4 0 0 2  PHQ-9 Total Score 21 12 0 2 10   Flowsheet Row ED from 07/10/2024 in [facility] ED from 07/08/2024 in [facility] ED from 05/17/2024 in [facility] Department at [facility]  C-SSRS RISK CATEGORY No Risk No Risk No Risk     Musculoskeletal  Strength & Muscle Tone: within normal limits Gait & Station: normal Patient leans: N/A  Psychiatric Specialty Exam  Presentation  General Appearance:  Disheveled  Eye Contact: Fair  Speech: Clear and Coherent (edentulous)  Speech Volume: Decreased  Handedness: Right   Mood and Affect  Mood: Anxious; Dysphoric; Depressed  Affect: Congruent   Thought Process  Thought Processes: Coherent  Descriptions of Associations:Intact  Orientation:Full (Time, Place and Person)  Thought Content:Logical  Diagnosis of Schizophrenia or Schizoaffective disorder in past: No    Hallucinations:Hallucinations: None  Ideas of Reference:None  Suicidal Thoughts:Suicidal Thoughts: No  Homicidal Thoughts:Homicidal Thoughts: No   Sensorium  Memory: Immediate Fair; Recent Fair  Judgment: Fair  Insight: Fair   Art Therapist  Concentration: Poor  Attention Span: Poor  Recall: [employer] of  Knowledge: Fair  Language: Fair   Psychomotor Activity  Psychomotor Activity: Psychomotor Activity: Normal   Assets  Assets: Manufacturing Systems Engineer; Desire for Improvement; Resilience   Sleep  Sleep: Sleep: Poor  Estimated Sleeping Duration (Last 24 Hours): 10.75-13.25 hours (Due to Daylight Saving Time, the durations displayed may not accurately represent documentation during the time change interval)  No data recorded  Physical Exam  Physical Exam ROS Blood pressure 115/62, pulse 100, temperature 97.7 F (36.5 C), temperature source Oral, resp. rate 18, SpO2 100%. There is no height or weight on file to calculate BMI.  Treatment Plan Summary: Daily contact with patient to assess and evaluate symptoms and progress in treatment and Medication management Continue multimodal treatment in [facility]. Maintain current medication regimen which has been well tolerated but consider simplification with consolidation of quetiapine  100/300 to quetiapine  ER 400mg +.  Nitrofurantoin  course to be completed. Monitor for s/sxs of infection. Including surveillance U/A today. Update labs to re-evaluate transaminases, albumin, and hematological status. Will trial Suboxone  8/2mg  once daily dosing 1-2hrs after lunch which patient identifies as period of maximal urge to use. Risks/Benefits explained to patient. Suboxone  treatment may have an impact on facilities available for stepdown. Anticipate discharge to transitional placement in coming days followed by anticipated admission to [facility] Treatment on 12/1. Patient at high risk of relapse in absence of secure transition into substance treatment program.  KANDI JAYSON HAHN, MD 07/19/2024 1:44 PM

## 2024-07-19 NOTE — Group Note (Signed)
 Group Topic: Relapse and Recovery  Group Date: 07/19/2024 Start Time: 2000 End Time: 2115 Facilitators: Tanda Ogren D, NT  Department: [facility]  Number of Participants: 5  Group Focus: activities of daily living skills, coping skills, and relapse prevention Treatment Modality:  Psychoeducation Interventions utilized were story telling Purpose: enhance coping skills, express feelings, improve communication skills, regain self-worth, reinforce self-care, and relapse prevention strategies  Name: Morgan Donaldson Date of Birth: [birth year]  MR: [id number]    Level of Participation: moderate Quality of Participation: cooperative Interactions with others: gave feedback Mood/Affect: appropriate Triggers (if applicable): N/A Cognition: coherent/clear Progress: Significant Response: N/A Plan: follow-up needed  Patients Problems:  Patient Active Problem List   Diagnosis Date Noted   Hyperthyroidism 07/16/2024   Opioid dependence with uncomplicated intoxication (HCC) 07/14/2024   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   Elevated liver function tests 07/14/2024   Opioid use with withdrawal (HCC) 07/14/2024   Polysubstance abuse (HCC) 07/10/2024   Pyosalpingitis 02/12/2024   Hepatitis C antibody detected 02/11/2024   PID (acute pelvic inflammatory disease) 02/10/2024   Gonorrhea 12/10/2023   Group A streptococcal infection 12/09/2023   IV drug abuse (HCC) 12/09/2023   Acute cystitis 12/09/2023   GAD (generalized anxiety disorder) 06/21/2020   Attention deficit hyperactivity disorder (ADHD), 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

## 2024-07-19 NOTE — ED Notes (Signed)
 Pt sleeping at this time. Rise and fall of chest noted. Pt in NAD at this time. Will continue to monitor.

## 2024-07-19 NOTE — ED Notes (Addendum)
 No reports of pain. Pt denies SI/HI/AVH. Pt compliant with meds, prn trazodone  and milk of mag given. She was calm and cooperative, appropriate interactions with peers. No acute distress noted. Continue to monitor for safety.

## 2024-07-19 NOTE — ED Notes (Signed)
Patient sleeping with no s/s of distress.

## 2024-07-19 NOTE — Group Note (Signed)
 Group Topic: Relaxation  Group Date: 07/19/2024 Start Time: 1715 End Time: 1755 Facilitators: Liston Cooper PARAS, NT  Department: [facility]  Number of Participants: 7  Group Focus: relaxation Treatment Modality:  Patient-Centered Therapy Interventions utilized were patient education Purpose: reinforce self-care  Name: Morgan Donaldson Date of Birth: [birth year]  MR: [id number]    Level of Participation: active Quality of Participation: attentive Interactions with others: gave feedback Mood/Affect: appropriate Triggers (if applicable):   Cognition: coherent/clear Progress: Gaining insight Response:   Plan: patient will be encouraged to practice relaxation techniques  Patients Problems:  Patient Active Problem List   Diagnosis Date Noted   Hyperthyroidism 07/16/2024   Opioid dependence with uncomplicated intoxication (HCC) 07/14/2024   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   Elevated liver function tests 07/14/2024   Opioid use with withdrawal (HCC) 07/14/2024   Polysubstance abuse (HCC) 07/10/2024   Pyosalpingitis 02/12/2024   Hepatitis C antibody detected 02/11/2024   PID (acute pelvic inflammatory disease) 02/10/2024   Gonorrhea 12/10/2023   Group A streptococcal infection 12/09/2023   IV drug abuse (HCC) 12/09/2023   Acute cystitis 12/09/2023   GAD (generalized anxiety disorder) 06/21/2020   Attention deficit hyperactivity disorder (ADHD), 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

## 2024-07-20 DIAGNOSIS — F1924 Other psychoactive substance dependence with psychoactive substance-induced mood disorder: Secondary | ICD-10-CM | POA: Diagnosis not present

## 2024-07-20 DIAGNOSIS — F1420 Cocaine dependence, uncomplicated: Secondary | ICD-10-CM | POA: Diagnosis not present

## 2024-07-20 DIAGNOSIS — F11220 Opioid dependence with intoxication, uncomplicated: Secondary | ICD-10-CM | POA: Diagnosis not present

## 2024-07-20 DIAGNOSIS — F411 Generalized anxiety disorder: Secondary | ICD-10-CM | POA: Diagnosis not present

## 2024-07-20 MED ORDER — POLYETHYLENE GLYCOL 3350 17 G PO PACK
17.0000 g | PACK | Freq: Every day | ORAL | Status: DC | PRN
Start: 2024-07-20 — End: 2024-07-22
  Administered 2024-07-20: 17 g via ORAL
  Filled 2024-07-20: qty 1
  Filled 2024-07-20: qty 5

## 2024-07-20 MED ORDER — BUPRENORPHINE HCL-NALOXONE HCL 8-2 MG SL SUBL
1.0000 | SUBLINGUAL_TABLET | Freq: Every day | SUBLINGUAL | Status: DC
  Administered 2024-07-20: 1 via SUBLINGUAL
  Filled 2024-07-20: qty 1

## 2024-07-20 MED ORDER — SENNOSIDES-DOCUSATE SODIUM 8.6-50 MG PO TABS
1.0000 | ORAL_TABLET | Freq: Two times a day (BID) | ORAL | Status: DC
  Administered 2024-07-20 – 2024-07-22 (×4): 1 via ORAL
  Filled 2024-07-20 (×3): qty 1
  Filled 2024-07-20: qty 14
  Filled 2024-07-20: qty 1
  Filled 2024-07-20: qty 14

## 2024-07-20 MED ORDER — BUPRENORPHINE HCL-NALOXONE HCL 8-2 MG SL SUBL
1.0000 | SUBLINGUAL_TABLET | Freq: Every day | SUBLINGUAL | Status: DC
  Administered 2024-07-21 – 2024-07-22 (×2): 1 via SUBLINGUAL
  Filled 2024-07-20 (×2): qty 1

## 2024-07-20 MED ORDER — ENSURE PLUS HIGH PROTEIN PO LIQD
237.0000 mL | Freq: Two times a day (BID) | ORAL | Status: DC
  Administered 2024-07-21 (×2): 237 mL via ORAL

## 2024-07-20 NOTE — ED Notes (Signed)
 Patient was upset because she could not have what she wanted for breakfast, the witness explained to her that we were short on supplies and could not give her both cereal and oatmeal, she used profanity, said she had it yesterday, the patient has been here for two weeks she knows what's for breakfast, she was staff splitting. She continued to complain and use profanity. The witness was finally able to give her what she wanted, when one of the other patient's did not want any cereal I was able to give her one because we were short on milk and oatmeal. She also wanted a turkey sandwich, saying she had one yesterday. Explained we do not have any and that is not a part of the food for [facility]. I also told the nurse and the shift supervisor.

## 2024-07-20 NOTE — ED Notes (Signed)
 Pt administered dose of miralax  for constipation. Pt calmly watching tv in dayroom, no signs of distress observed.

## 2024-07-20 NOTE — ED Notes (Addendum)
 Pt watching TV in dayroom, no distress observed. No complaints or concerns reported to RN. No distress observed.

## 2024-07-20 NOTE — ED Notes (Signed)
 Pt is sleeping at the moment. No acute distress noted. Q15 safety checks in place.

## 2024-07-20 NOTE — ED Notes (Signed)
 Pt sleeping in bed. Respirations even and unlabored. Continue to monitor for safety.

## 2024-07-20 NOTE — ED Provider Notes (Signed)
 Behavioral Health Progress Note  Date and Time: 07/20/2024 5:54 PM Name: Morgan Donaldson MRN:  [id number]  Subjective:  Morgan Donaldson reports significant positive response to Suboxone  initiated 11/22. After two doses patient notes total resolution of cravings including fentanyl  and nicotine . Only notable AE is constipation and she requests stool softener but agrees to that approach plus PEG prn cleanout. She would like to move Suboxone  earlier in day. 1000 is acceptable but to avoid po intake interference we will decrease TID Ensure to BID. Patient also experiencing vaginal discomfort/itch unclear if related to undergarment deficiency or post-abx fungal/yeast. Patient would like to stay at [facility] until [facility] 12/1 admission but she was advised this is unlikely.  Diagnosis:  Final diagnoses:  Polysubstance abuse (HCC)  Opioid dependence with uncomplicated intoxication (HCC)  Drug-induced mood disorder (HCC)  Elevated liver function tests  Opioid use with withdrawal (HCC)  Hyperthyroidism  Generalized anxiety disorder   Total Time spent with patient: I personally spent 20 minutes on the unit in direct patient care. The direct patient care time included face-to-face time with the patient, reviewing the patient's chart, communicating with other professionals, and coordinating care. Greater than 50% of this time was spent in counseling or coordinating care with the patient regarding goals of hospitalization, psycho-education, and discharge planning needs.   On my assessment the patient denied SI, HI, AVH, paranoia, ideas of reference, or first rank symptoms. Patient endorses relief of drug cravings with Suboxone  but also tx-emergent constipation.     Provider Admission HPI: Morgan Donaldson is  a 43 year old female who presented  to [facility] with a history of Opioid Use Disorder, severe and Stimulant Use Disorder, cocaine type, severe along with untreated Bipolar Disorder. Pt was transferred to Facility Based  Crisis unit for detox from Fentanyl  and crack cocaine. UDS positive for cocaine, morphine  and THC. BAL upon admission is negative.    Patient reported she has been using 1/2 gram of fentanyl  IV for the past year.  She has also been smoking approximately 1/2 gram of crack cocaine daily for the past year.  Patient stated she is done with using, stating she had this realization when she [place] a bridge this morning.  Patient has been homeless for the past year.  She reported  she has a boyfriend, and things are going well outside of patient's substance use issues.  Patient stated her boyfriend is supportive of her seeking treatment and will allow her to stay with him once she is clean and has completed treatment.  Patient reported  she is also estranged from her sister and other family members due to her ongoing issues with substance use.  She is hopeful that after treatment she can reconnect with family.    Sleep: Good   Appetite:  Fair  Current Medications:  Current Facility-Administered Medications  Medication Dose Route Frequency Provider Last Rate Last Admin   acetaminophen  (TYLENOL ) tablet 650 mg  650 mg Oral Q6H PRN Randall Starlyn HERO, NP   650 mg at 07/13/24 0836   alum & mag hydroxide-simeth (MAALOX/MYLANTA) 200-200-20 MG/5ML suspension 30 mL  30 mL Oral Q4H PRN Onuoha, Chinwendu V, NP   30 mL at 07/15/24 2106   [START ON 07/21/2024] buprenorphine -naloxone  (SUBOXONE ) 8-2 mg per SL tablet 1 tablet  1 tablet Sublingual Q0600 Cole Kandi BROCKS, MD       busPIRone  (BUSPAR ) tablet 20 mg  20 mg Oral BID Franci Oshana C, MD   20 mg at 07/20/24  1618   haloperidol  (HALDOL ) tablet 5 mg  5 mg Oral TID PRN Randall Starlyn HERO, NP   5 mg at 07/18/24 8371   And   diphenhydrAMINE  (BENADRYL ) capsule 50 mg  50 mg Oral TID PRN Randall Starlyn HERO, NP   50 mg at 07/18/24 1628   haloperidol  lactate (HALDOL ) injection 5 mg  5 mg Intramuscular TID PRN Randall Starlyn HERO, NP       And    diphenhydrAMINE  (BENADRYL ) injection 50 mg  50 mg Intramuscular TID PRN Randall Starlyn HERO, NP       And   LORazepam  (ATIVAN ) injection 2 mg  2 mg Intramuscular TID PRN Randall Starlyn HERO, NP       haloperidol  lactate (HALDOL ) injection 10 mg  10 mg Intramuscular TID PRN Randall Starlyn HERO, NP       And   diphenhydrAMINE  (BENADRYL ) injection 50 mg  50 mg Intramuscular TID PRN Randall Starlyn HERO, NP       And   LORazepam  (ATIVAN ) injection 2 mg  2 mg Intramuscular TID PRN Randall Starlyn HERO, NP       [START ON 07/21/2024] feeding supplement (ENSURE PLUS HIGH PROTEIN) liquid 237 mL  237 mL Oral BID BM Dervin Vore C, MD       magnesium  hydroxide (MILK OF MAGNESIA) suspension 30 mL  30 mL Oral Daily PRN Randall, Veronique M, NP   30 mL at 07/19/24 2108   nicotine  (NICODERM CQ  - dosed in mg/24 hours) patch 21 mg  21 mg Transdermal Daily Byungura, Veronique M, NP   21 mg at 07/20/24 9090   nicotine  polacrilex (NICORETTE ) gum 2 mg  2 mg Oral PRN Gottfried, Rhoda J, MD   2 mg at 07/19/24 1801   polyethylene glycol (MIRALAX  / GLYCOLAX ) packet 17 g  17 g Oral Daily PRN Exa Bomba C, MD       QUEtiapine  (SEROQUEL ) tablet 100 mg  100 mg Oral Landrum Hahn, Ines Rebel C, MD   100 mg at 07/20/24 0615   QUEtiapine  (SEROQUEL ) tablet 300 mg  300 mg Oral QHS Biff Rutigliano C, MD   300 mg at 07/19/24 2107   senna-docusate (Senokot-S) tablet 1 tablet  1 tablet Oral BID Hahn Kandi BROCKS, MD       traZODone  (DESYREL ) tablet 100 mg  100 mg Oral QHS PRN Randall Starlyn HERO, NP   100 mg at 07/19/24 2108   No current outpatient medications on file.    Labs  Lab Results:  Admission on 07/10/2024  Component Date Value Ref Range Status   T3, Free 07/11/2024 2.2  2.0 - 4.4 pg/mL Final   Comment: (NOTE) Performed At: [facility] [address] [place], [place] [id number] Jennette Shorter MD Ey:[phone]    Free T4 07/11/2024 0.71  0.61 - 1.12 ng/dL Final   Comment:  (NOTE) Biotin ingestion may interfere with free T4 tests. If the results are inconsistent with the TSH level, previous test results, or the clinical presentation, then consider biotin interference. If needed, order repeat testing after stopping biotin. Performed at [facility], [address] [place], [place] [address]    Neisseria Gonorrhea 07/12/2024 Negative   Final   Chlamydia 07/12/2024 Negative   Final   Comment 07/12/2024 Normal Reference Ranger Chlamydia - Negative   Final   Comment 07/12/2024 Normal Reference Range Neisseria Gonorrhea - Negative   Final   Total Protein 07/12/2024 7.1  6.5 - 8.1 g/dL Final   Albumin 88/84/7974  3.3 (L)  3.5 - 5.0 g/dL Final   AST 88/84/7974 67 (H)  15 - 41 U/L Final   ALT 07/12/2024 95 (H)  0 - 44 U/L Final   Alkaline Phosphatase 07/12/2024 110  38 - 126 U/L Final   Total Bilirubin 07/12/2024 0.6  0.0 - 1.2 mg/dL Final   Bilirubin, Direct 07/12/2024 <0.1  0.0 - 0.2 mg/dL Final   Indirect Bilirubin 07/12/2024 NOT CALCULATED  0.3 - 0.9 mg/dL Final   Performed at [facility], [address] [place], [place] [address]   RPR Ser Ql 07/12/2024 NON REACTIVE  NON REACTIVE Final   Performed at [facility], [address], [place], [place] [address]   [facility] test code 07/12/2024 [id number]   Final   [facility] test name 07/12/2024 [id number]   Final   Source ([facility]) 07/12/2024 SERUM   Final   Performed at [facility], [address], [place], [place] [address]   Misc [facility] result 07/12/2024 COMMENT   Final   Comment: (NOTE) Test Ordered: [id number] HIV Ab/p24 Ag with Reflex HIV Ab/p24 Ag Screen           Note:                     BN     Non Reactive                                                  Reference Range: Non Reactive                          HIV-1/HIV-2 antibodies and HIV-1 p24 antigen were NOT detected. There is no laboratory evidence of HIV infection. HIV Negative Performed At: [facility] [address]  [place], [place] [id number] Jennette Shorter MD Ey:[phone]    Color, Urine 07/19/2024 YELLOW  YELLOW Final   APPearance 07/19/2024 CLOUDY (A)  CLEAR Final   Specific Gravity, Urine 07/19/2024 1.018  1.005 - 1.030 Final   pH 07/19/2024 6.0  5.0 - 8.0 Final   Glucose, UA 07/19/2024 NEGATIVE  NEGATIVE mg/dL Final   Hgb urine dipstick 07/19/2024 NEGATIVE  NEGATIVE Final   Bilirubin Urine 07/19/2024 NEGATIVE  NEGATIVE Final   Ketones, ur 07/19/2024 NEGATIVE  NEGATIVE mg/dL Final   Protein, ur 88/77/7974 NEGATIVE  NEGATIVE mg/dL Final   Nitrite 88/77/7974 NEGATIVE  NEGATIVE Final   Leukocytes,Ua 07/19/2024 LARGE (A)  NEGATIVE Final   RBC / HPF 07/19/2024 0-5  0 - 5 RBC/hpf Final   WBC, UA 07/19/2024 11-20  0 - 5 WBC/hpf Final   Bacteria, UA 07/19/2024 RARE (A)  NONE SEEN Final   Squamous Epithelial / HPF 07/19/2024 0-5  0 - 5 /HPF Final   Mucus 07/19/2024 PRESENT   Final   Performed at [facility], [address], [place], [place] [address]   Sodium 07/19/2024 139  135 - 145 mmol/L Final   Potassium 07/19/2024 4.0  3.5 - 5.1 mmol/L Final   Chloride 07/19/2024 103  98 - 111 mmol/L Final   CO2 07/19/2024 25  22 - 32 mmol/L Final   Glucose, Bld 07/19/2024 99  70 - 99 mg/dL Final   Glucose reference range applies only to samples taken after fasting for at least 8 hours.   BUN  07/19/2024 22 (H)  6 - 20 mg/dL Final   Creatinine, Ser 07/19/2024 0.78  0.44 - 1.00 mg/dL Final   Calcium 88/77/7974 8.8 (L)  8.9 - 10.3 mg/dL Final   Total Protein 88/77/7974 7.0  6.5 - 8.1 g/dL Final   Albumin 88/77/7974 3.3 (L)  3.5 - 5.0 g/dL Final   AST 88/77/7974 115 (H)  15 - 41 U/L Final   ALT 07/19/2024 184 (H)  0 - 44 U/L Final   Alkaline Phosphatase 07/19/2024 72  38 - 126 U/L Final   Total Bilirubin 07/19/2024 0.4  0.0 - 1.2 mg/dL Final   GFR, Estimated 07/19/2024 >60  >60 mL/min Final   Comment: (NOTE) Calculated using the CKD-EPI Creatinine Equation (2021)    Anion gap 07/19/2024 11  5 - 15  Final   Performed at [facility], [address] [place], [place] [address]   WBC 07/19/2024 10.6 (H)  4.0 - 10.5 K/uL Final   RBC 07/19/2024 4.38  3.87 - 5.11 MIL/uL Final   Hemoglobin 07/19/2024 12.7  12.0 - 15.0 g/dL Final   HCT 88/77/7974 39.1  36.0 - 46.0 % Final   MCV 07/19/2024 89.3  80.0 - 100.0 fL Final   MCH 07/19/2024 29.0  26.0 - 34.0 pg Final   MCHC 07/19/2024 32.5  30.0 - 36.0 g/dL Final   RDW 88/77/7974 14.0  11.5 - 15.5 % Final   Platelets 07/19/2024 305  150 - 400 K/uL Final   nRBC 07/19/2024 0.0  0.0 - 0.2 % Final   Neutrophils Relative % 07/19/2024 64  % Final   Neutro Abs 07/19/2024 6.8  1.7 - 7.7 K/uL Final   Lymphocytes Relative 07/19/2024 23  % Final   Lymphs Abs 07/19/2024 2.4  0.7 - 4.0 K/uL Final   Monocytes Relative 07/19/2024 7  % Final   Monocytes Absolute 07/19/2024 0.8  0.1 - 1.0 K/uL Final   Eosinophils Relative 07/19/2024 4  % Final   Eosinophils Absolute 07/19/2024 0.4  0.0 - 0.5 K/uL Final   Basophils Relative 07/19/2024 1  % Final   Basophils Absolute 07/19/2024 0.1  0.0 - 0.1 K/uL Final   Immature Granulocytes 07/19/2024 1  % Final   Abs Immature Granulocytes 07/19/2024 0.07  0.00 - 0.07 K/uL Final   Performed at [facility], [address], [place], [place] [address]  Admission on 07/08/2024, Discharged on 07/10/2024  Component Date Value Ref Range Status   WBC 07/08/2024 10.6 (H)  4.0 - 10.5 K/uL Final   RBC 07/08/2024 5.65 (H)  3.87 - 5.11 MIL/uL Final   Hemoglobin 07/08/2024 16.5 (H)  12.0 - 15.0 g/dL Final   HCT 88/88/7974 49.4 (H)  36.0 - 46.0 % Final   MCV 07/08/2024 87.4  80.0 - 100.0 fL Final   MCH 07/08/2024 29.2  26.0 - 34.0 pg Final   MCHC 07/08/2024 33.4  30.0 - 36.0 g/dL Final   RDW 88/88/7974 13.7  11.5 - 15.5 % Final   Platelets 07/08/2024 422 (H)  150 - 400 K/uL Final   nRBC 07/08/2024 0.0  0.0 - 0.2 % Final   Neutrophils Relative % 07/08/2024 75  % Final   Neutro Abs 07/08/2024 7.9 (H)  1.7 - 7.7 K/uL Final    Lymphocytes Relative 07/08/2024 17  % Final   Lymphs Abs 07/08/2024 1.8  0.7 - 4.0 K/uL Final   Monocytes Relative 07/08/2024 7  % Final   Monocytes Absolute 07/08/2024 0.7  0.1 -  1.0 K/uL Final   Eosinophils Relative 07/08/2024 0  % Final   Eosinophils Absolute 07/08/2024 0.0  0.0 - 0.5 K/uL Final   Basophils Relative 07/08/2024 1  % Final   Basophils Absolute 07/08/2024 0.1  0.0 - 0.1 K/uL Final   Immature Granulocytes 07/08/2024 0  % Final   Abs Immature Granulocytes 07/08/2024 0.04  0.00 - 0.07 K/uL Final   Performed at [facility], [address] [place], [place] [address]   Sodium 07/08/2024 140  135 - 145 mmol/L Final   Potassium 07/08/2024 4.1  3.5 - 5.1 mmol/L Final   Chloride 07/08/2024 100  98 - 111 mmol/L Final   CO2 07/08/2024 27  22 - 32 mmol/L Final   Glucose, Bld 07/08/2024 113 (H)  70 - 99 mg/dL Final   Glucose reference range applies only to samples taken after fasting for at least 8 hours.   BUN 07/08/2024 10  6 - 20 mg/dL Final   Creatinine, Ser 07/08/2024 0.74  0.44 - 1.00 mg/dL Final   Calcium 88/88/7974 9.9  8.9 - 10.3 mg/dL Final   Total Protein 88/88/7974 7.9  6.5 - 8.1 g/dL Final   Albumin 88/88/7974 3.5  3.5 - 5.0 g/dL Final   AST 88/88/7974 109 (H)  15 - 41 U/L Final   ALT 07/08/2024 132 (H)  0 - 44 U/L Final   Alkaline Phosphatase 07/08/2024 74  38 - 126 U/L Final   Total Bilirubin 07/08/2024 0.3  0.0 - 1.2 mg/dL Final   GFR, Estimated 07/08/2024 >60  >60 mL/min Final   Comment: (NOTE) Calculated using the CKD-EPI Creatinine Equation (2021)    Anion gap 07/08/2024 13  5 - 15 Final   Performed at [facility], [address], [place], [place] [address]   Hgb A1c MFr Bld 07/08/2024 5.5  4.8 - 5.6 % Final   Comment: (NOTE) Diagnosis of Diabetes The following HbA1c ranges recommended by the American Diabetes Association (ADA) may be used as an aid in the diagnosis of diabetes mellitus.  Hemoglobin             Suggested A1C NGSP%               Diagnosis  <5.7                   Non Diabetic  5.7-6.4                Pre-Diabetic  >6.4                   Diabetic  <7.0                   Glycemic control for                       adults with diabetes.     Mean Plasma Glucose 07/08/2024 111.15  mg/dL Final   Performed at [facility], [address] [place], [place] [address]   Magnesium  07/08/2024 2.1  1.7 - 2.4 mg/dL Final   Performed at [facility], [address] [place], [place] [address]   Alcohol, Ethyl (B) 07/08/2024 <15  <15 mg/dL Final   Comment: (NOTE) For medical purposes only. Performed at [facility], [address] [place], [place] [address]    Cholesterol 07/08/2024 174  0 - 200 mg/dL Final   Triglycerides 88/88/7974 102  <150 mg/dL Final  HDL 07/08/2024 42  >40 mg/dL Final   Total CHOL/HDL Ratio 07/08/2024 4.1  RATIO Final   VLDL 07/08/2024 20  0 - 40 mg/dL Final   LDL Cholesterol 07/08/2024 112 (H)  0 - 99 mg/dL Final   Comment:        Total Cholesterol/HDL:CHD Risk Coronary Heart Disease Risk Table                     Men   Women  1/2 Average Risk   3.4   3.3  Average Risk       5.0   4.4  2 X Average Risk   9.6   7.1  3 X Average Risk  23.4   11.0        Use the calculated Patient Ratio above and the CHD Risk Table to determine the patient's CHD Risk.        ATP III CLASSIFICATION (LDL):  <100     mg/dL   Optimal  899-870  mg/dL   Near or Above                    Optimal  130-159  mg/dL   Borderline  839-810  mg/dL   High  >809     mg/dL   Very High Performed at [facility], [address], [place], [place] [address]    Color, Urine 07/09/2024 YELLOW  YELLOW Final   APPearance 07/09/2024 TURBID (A)  CLEAR Final   Specific Gravity, Urine 07/09/2024 1.017  1.005 - 1.030 Final   pH 07/09/2024 7.0  5.0 - 8.0 Final   Glucose, UA 07/09/2024 NEGATIVE  NEGATIVE mg/dL Final   Hgb urine dipstick 07/09/2024 NEGATIVE  NEGATIVE Final   Bilirubin Urine 07/09/2024  NEGATIVE  NEGATIVE Final   Ketones, ur 07/09/2024 NEGATIVE  NEGATIVE mg/dL Final   Protein, ur 88/87/7974 NEGATIVE  NEGATIVE mg/dL Final   Nitrite 88/87/7974 NEGATIVE  NEGATIVE Final   Leukocytes,Ua 07/09/2024 MODERATE (A)  NEGATIVE Final   RBC / HPF 07/09/2024 0-5  0 - 5 RBC/hpf Final   WBC, UA 07/09/2024 21-50  0 - 5 WBC/hpf Final   Bacteria, UA 07/09/2024 MANY (A)  NONE SEEN Final   Squamous Epithelial / HPF 07/09/2024 0-5  0 - 5 /HPF Final   WBC Clumps 07/09/2024 PRESENT   Final   Performed at [facility], [address] [place], [place] [address]   Preg Test, Ur 07/09/2024 Negative  Negative Final   POC Amphetamine  UR 07/09/2024 None Detected  NONE DETECTED (Cut Off Level 1000 ng/mL) Final   POC Secobarbital (BAR) 07/09/2024 None Detected  NONE DETECTED (Cut Off Level 300 ng/mL) Final   POC Buprenorphine  (BUP) 07/09/2024 None Detected  NONE DETECTED (Cut Off Level 10 ng/mL) Final   POC Oxazepam (BZO) 07/09/2024 None Detected  NONE DETECTED (Cut Off Level 300 ng/mL) Final   POC Cocaine UR 07/09/2024 Positive (A)  NONE DETECTED (Cut Off Level 300 ng/mL) Final   POC Methamphetamine UR 07/09/2024 None Detected  NONE DETECTED (Cut Off Level 1000 ng/mL) Final   POC Morphine  07/09/2024 Positive (A)  NONE DETECTED (Cut Off Level 300 ng/mL) Final   POC Methadone UR 07/09/2024 None Detected  NONE DETECTED (Cut Off Level 300 ng/mL) Final   POC Oxycodone  UR 07/09/2024 None Detected  NONE DETECTED (Cut Off Level 100 ng/mL) Final   POC Marijuana UR 07/09/2024 Positive (A)  NONE DETECTED (Cut Off Level 50 ng/mL) Final  TSH 07/08/2024 0.112 (L)  0.350 - 4.500 uIU/mL Final   Comment: Performed by a 3rd Generation assay with a functional sensitivity of <=0.01 uIU/mL. Performed at [facility], [address] [place], [place] [address]    Preg Test, Ur 07/09/2024 NEGATIVE  NEGATIVE Final   Comment:        THE SENSITIVITY OF THIS METHODOLOGY IS >20 mIU/mL.   Admission on 05/17/2024,  Discharged on 05/18/2024  Component Date Value Ref Range Status   WBC 05/17/2024 9.0  4.0 - 10.5 K/uL Final   RBC 05/17/2024 4.35  3.87 - 5.11 MIL/uL Final   Hemoglobin 05/17/2024 12.8  12.0 - 15.0 g/dL Final   HCT 90/79/7974 39.7  36.0 - 46.0 % Final   MCV 05/17/2024 91.3  80.0 - 100.0 fL Final   MCH 05/17/2024 29.4  26.0 - 34.0 pg Final   MCHC 05/17/2024 32.2  30.0 - 36.0 g/dL Final   RDW 90/79/7974 14.1  11.5 - 15.5 % Final   Platelets 05/17/2024 286  150 - 400 K/uL Final   nRBC 05/17/2024 0.0  0.0 - 0.2 % Final   Neutrophils Relative % 05/17/2024 65  % Final   Neutro Abs 05/17/2024 5.8  1.7 - 7.7 K/uL Final   Lymphocytes Relative 05/17/2024 23  % Final   Lymphs Abs 05/17/2024 2.1  0.7 - 4.0 K/uL Final   Monocytes Relative 05/17/2024 8  % Final   Monocytes Absolute 05/17/2024 0.8  0.1 - 1.0 K/uL Final   Eosinophils Relative 05/17/2024 4  % Final   Eosinophils Absolute 05/17/2024 0.3  0.0 - 0.5 K/uL Final   Basophils Relative 05/17/2024 0  % Final   Basophils Absolute 05/17/2024 0.0  0.0 - 0.1 K/uL Final   Immature Granulocytes 05/17/2024 0  % Final   Abs Immature Granulocytes 05/17/2024 0.04  0.00 - 0.07 K/uL Final   Performed at [facility] Lab, [address], [place], [place] [address]   Preg, Serum 05/17/2024 NEGATIVE  NEGATIVE Final   Comment:        THE SENSITIVITY OF THIS METHODOLOGY IS >10 mIU/mL. Performed at [facility], [address] [place], [place] [address]    Sodium 05/17/2024 141  135 - 145 mmol/L Final   Potassium 05/17/2024 3.5  3.5 - 5.1 mmol/L Final   Chloride 05/17/2024 104  98 - 111 mmol/L Final   CO2 05/17/2024 26  22 - 32 mmol/L Final   Glucose, Bld 05/17/2024 118 (H)  70 - 99 mg/dL Final   Glucose reference range applies only to samples taken after fasting for at least 8 hours.   BUN 05/17/2024 11  6 - 20 mg/dL Final   Creatinine, Ser 05/17/2024 0.78  0.44 - 1.00 mg/dL Final   Calcium 90/79/7974 8.8 (L)  8.9 - 10.3 mg/dL Final   Total  Protein 05/17/2024 7.0  6.5 - 8.1 g/dL Final   Albumin 90/79/7974 3.0 (L)  3.5 - 5.0 g/dL Final   AST 90/79/7974 115 (H)  15 - 41 U/L Final   ALT 05/17/2024 144 (H)  0 - 44 U/L Final   Alkaline Phosphatase 05/17/2024 71  38 - 126 U/L Final   Total Bilirubin 05/17/2024 0.5  0.0 - 1.2 mg/dL Final   GFR, Estimated 05/17/2024 >60  >60 mL/min Final   Comment: (NOTE) Calculated using the CKD-EPI Creatinine Equation (2021)    Anion gap 05/17/2024 11  5 - 15 Final   Performed at [facility]  Lab, [address] [place], [place] [address]   Sodium 05/17/2024 142  135 - 145 mmol/L Final   Potassium 05/17/2024 3.5  3.5 - 5.1 mmol/L Final   Chloride 05/17/2024 103  98 - 111 mmol/L Final   BUN 05/17/2024 12  6 - 20 mg/dL Final   Creatinine, Ser 05/17/2024 0.70  0.44 - 1.00 mg/dL Final   Glucose, Bld 90/79/7974 120 (H)  70 - 99 mg/dL Final   Glucose reference range applies only to samples taken after fasting for at least 8 hours.   Calcium, Ion 05/17/2024 1.13 (L)  1.15 - 1.40 mmol/L Final   TCO2 05/17/2024 28  22 - 32 mmol/L Final   Hemoglobin 05/17/2024 11.6 (L)  12.0 - 15.0 g/dL Final   HCT 90/79/7974 34.0 (L)  36.0 - 46.0 % Final  Admission on 03/25/2024, Discharged on 03/25/2024  Component Date Value Ref Range Status   Sodium 03/25/2024 140  135 - 145 mmol/L Final   Potassium 03/25/2024 3.2 (L)  3.5 - 5.1 mmol/L Final   Chloride 03/25/2024 105  98 - 111 mmol/L Final   BUN 03/25/2024 9  6 - 20 mg/dL Final   Creatinine, Ser 03/25/2024 0.70  0.44 - 1.00 mg/dL Final   Glucose, Bld 92/70/7974 132 (H)  70 - 99 mg/dL Final   Glucose reference range applies only to samples taken after fasting for at least 8 hours.   Calcium, Ion 03/25/2024 0.99 (L)  1.15 - 1.40 mmol/L Final   TCO2 03/25/2024 24  22 - 32 mmol/L Final   Hemoglobin 03/25/2024 12.9  12.0 - 15.0 g/dL Final   HCT 92/70/7974 38.0  36.0 - 46.0 % Final   Lactic Acid, Venous 03/25/2024 3.2 (HH)  0.5 - 1.9 mmol/L Final   Comment  03/25/2024 NOTIFIED PHYSICIAN   Final  Admission on 02/10/2024, Discharged on 02/12/2024  Component Date Value Ref Range Status   WBC 02/10/2024 16.8 (H)  4.0 - 10.5 K/uL Final   RBC 02/10/2024 4.46  3.87 - 5.11 MIL/uL Final   Hemoglobin 02/10/2024 12.7  12.0 - 15.0 g/dL Final   HCT 93/84/7974 39.5  36.0 - 46.0 % Final   MCV 02/10/2024 88.6  80.0 - 100.0 fL Final   MCH 02/10/2024 28.5  26.0 - 34.0 pg Final   MCHC 02/10/2024 32.2  30.0 - 36.0 g/dL Final   RDW 93/84/7974 14.8  11.5 - 15.5 % Final   Platelets 02/10/2024 331  150 - 400 K/uL Final   nRBC 02/10/2024 0.0  0.0 - 0.2 % Final   Neutrophils Relative % 02/10/2024 83  % Final   Neutro Abs 02/10/2024 14.0 (H)  1.7 - 7.7 K/uL Final   Lymphocytes Relative 02/10/2024 9  % Final   Lymphs Abs 02/10/2024 1.6  0.7 - 4.0 K/uL Final   Monocytes Relative 02/10/2024 6  % Final   Monocytes Absolute 02/10/2024 1.0  0.1 - 1.0 K/uL Final   Eosinophils Relative 02/10/2024 1  % Final   Eosinophils Absolute 02/10/2024 0.1  0.0 - 0.5 K/uL Final   Basophils Relative 02/10/2024 0  % Final   Basophils Absolute 02/10/2024 0.1  0.0 - 0.1 K/uL Final   Immature Granulocytes 02/10/2024 1  % Final   Abs Immature Granulocytes 02/10/2024 0.08 (H)  0.00 - 0.07 K/uL Final   Performed at [facility], [address] [place], [place] [address]   Sodium 02/10/2024 136  135 - 145 mmol/L Final   Potassium 02/10/2024 3.9  3.5 - 5.1 mmol/L Final   Chloride 02/10/2024 101  98 - 111 mmol/L Final   CO2 02/10/2024 25  22 - 32 mmol/L Final   Glucose, Bld 02/10/2024 94  70 - 99 mg/dL Final   Glucose reference range applies only to samples taken after fasting for at least 8 hours.   BUN 02/10/2024 7  6 - 20 mg/dL Final   Creatinine, Ser 02/10/2024 0.66  0.44 - 1.00 mg/dL Final   Calcium 93/84/7974 8.7 (L)  8.9 - 10.3 mg/dL Final   Total Protein 93/84/7974 6.8  6.5 - 8.1 g/dL Final   Albumin 93/84/7974 3.2 (L)  3.5 - 5.0 g/dL Final   AST 93/84/7974 215 (H)  15 - 41  U/L Final   ALT 02/10/2024 143 (H)  0 - 44 U/L Final   Alkaline Phosphatase 02/10/2024 82  38 - 126 U/L Final   Total Bilirubin 02/10/2024 1.0  0.0 - 1.2 mg/dL Final   GFR, Estimated 02/10/2024 >60  >60 mL/min Final   Comment: (NOTE) Calculated using the CKD-EPI Creatinine Equation (2021)    Anion gap 02/10/2024 10  5 - 15 Final   Performed at [facility], [address] [place], [place] [address]   Lipase 02/10/2024 25  11 - 51 U/L Final   Performed at [facility], [address], [place], [place] [address]   Specimen Source 02/10/2024 URINE, CLEAN CATCH   Final   Color, Urine 02/10/2024 YELLOW  YELLOW Final   APPearance 02/10/2024 CLOUDY (A)  CLEAR Final   Specific Gravity, Urine 02/10/2024 1.015  1.005 - 1.030 Final   pH 02/10/2024 7.0  5.0 - 8.0 Final   Glucose, UA 02/10/2024 NEGATIVE  NEGATIVE mg/dL Final   Hgb urine dipstick 02/10/2024 NEGATIVE  NEGATIVE Final   Bilirubin Urine 02/10/2024 NEGATIVE  NEGATIVE Final   Ketones, ur 02/10/2024 NEGATIVE  NEGATIVE mg/dL Final   Protein, ur 93/84/7974 30 (A)  NEGATIVE mg/dL Final   Nitrite 93/84/7974 POSITIVE (A)  NEGATIVE Final   Leukocytes,Ua 02/10/2024 MODERATE (A)  NEGATIVE Final   RBC / HPF 02/10/2024 0-5  0 - 5 RBC/hpf Final   WBC, UA 02/10/2024 21-50  0 - 5 WBC/hpf Final   Comment:        Reflex urine culture not performed if WBC <=10, OR if Squamous epithelial cells >5. If Squamous epithelial cells >5 suggest recollection.    Bacteria, UA 02/10/2024 FEW (A)  NONE SEEN Final   Squamous Epithelial / HPF 02/10/2024 0-5  0 - 5 /HPF Final   Mucus 02/10/2024 PRESENT   Final   Amorphous Crystal 02/10/2024 PRESENT   Final   Performed at [facility], [address], [place], [place] [address]   Preg Test, Ur 02/10/2024 NEGATIVE  NEGATIVE Final   Comment:        THE SENSITIVITY OF THIS METHODOLOGY IS >25 mIU/mL. Performed at [facility], [address] [place], [place] [address]    Specimen  Description 02/10/2024 BLOOD RIGHT FOREARM   Final   Special Requests 02/10/2024 BOTTLES DRAWN AEROBIC AND ANAEROBIC Blood Culture adequate volume   Final   Culture 02/10/2024    Final                   Value:NO GROWTH 5 DAYS Performed at [facility], [address] [place], [place] [address]    Report Status 02/10/2024 02/15/2024 FINAL   Final   Specimen Description  02/10/2024 BLOOD LEFT ANTECUBITAL   Final   Special Requests 02/10/2024 BOTTLES DRAWN AEROBIC AND ANAEROBIC Blood Culture results may not be optimal due to an inadequate volume of blood received in culture bottles   Final   Culture 02/10/2024    Final                   Value:NO GROWTH 5 DAYS Performed at [facility], [address] [place], [place] [address]    Report Status 02/10/2024 02/15/2024 FINAL   Final   Neisseria Gonorrhea 02/10/2024 Positive (A)   Final   Chlamydia 02/10/2024 Negative   Final   Comment 02/10/2024 Normal Reference Ranger Chlamydia - Negative   Final   Comment 02/10/2024 Normal Reference Range Neisseria Gonorrhea - Negative   Final   Yeast Wet Prep HPF POC 02/10/2024 NONE SEEN  NONE SEEN Final   Trich, Wet Prep 02/10/2024 NONE SEEN  NONE SEEN Final   Clue Cells Wet Prep HPF POC 02/10/2024 NONE SEEN  NONE SEEN Final   WBC, Wet Prep HPF POC 02/10/2024 >=10 (A)  <10 Final   Sperm 02/10/2024 NONE SEEN   Final   Performed at [facility], [address] [place], [place] [address]   HIV Screen 4th Generation wRfx 02/10/2024 Non Reactive  Non Reactive Final   Performed at [facility], [address] [place], [place] [address]   RPR Ser Ql 02/10/2024 NON REACTIVE  NON REACTIVE Final   Performed at [facility], [address], [place], [place] [address]   Hepatitis B Surface Ag 02/10/2024 NON REACTIVE  NON REACTIVE Final   HCV Ab 02/10/2024 Reactive (A)  NON REACTIVE Final   Comment: (NOTE) The CDC recommends that a Reactive HCV antibody result be followed up  with a  HCV Nucleic Acid Amplification test.     Hep A IgM 02/10/2024 NON REACTIVE  NON REACTIVE Final   Hep B C IgM 02/10/2024 NON REACTIVE  NON REACTIVE Final   Performed at [facility], [address] [place], [place] [address]   Specimen Description 02/10/2024 URINE, RANDOM   Final   Special Requests 02/10/2024    Final                   Value:NONE Reflexed from [id number] Performed at [facility], [address], [place], [place] [address]    Culture 02/10/2024 MULTIPLE SPECIES PRESENT, SUGGEST RECOLLECTION (A)   Final   Report Status 02/10/2024 02/11/2024 FINAL   Final   WBC 02/11/2024 17.9 (H)  4.0 - 10.5 K/uL Final   RBC 02/11/2024 4.32  3.87 - 5.11 MIL/uL Final   Hemoglobin 02/11/2024 12.4  12.0 - 15.0 g/dL Final   HCT 93/83/7974 38.1  36.0 - 46.0 % Final   MCV 02/11/2024 88.2  80.0 - 100.0 fL Final   MCH 02/11/2024 28.7  26.0 - 34.0 pg Final   MCHC 02/11/2024 32.5  30.0 - 36.0 g/dL Final   RDW 93/83/7974 14.9  11.5 - 15.5 % Final   Platelets 02/11/2024 328  150 - 400 K/uL Final   nRBC 02/11/2024 0.0  0.0 - 0.2 % Final   Performed at [facility], [address] [place], [place] [address]   Sodium 02/11/2024 134 (L)  135 - 145 mmol/L Final   Potassium 02/11/2024 4.1  3.5 - 5.1 mmol/L Final   Chloride 02/11/2024 103  98 - 111 mmol/L Final  CO2 02/11/2024 23  22 - 32 mmol/L Final   Glucose, Bld 02/11/2024 245 (H)  70 - 99 mg/dL Final   Glucose reference range applies only to samples taken after fasting for at least 8 hours.   BUN 02/11/2024 9  6 - 20 mg/dL Final   Creatinine, Ser 02/11/2024 0.76  0.44 - 1.00 mg/dL Final   Calcium 93/83/7974 7.9 (L)  8.9 - 10.3 mg/dL Final   Total Protein 93/83/7974 5.7 (L)  6.5 - 8.1 g/dL Final   Albumin 93/83/7974 2.3 (L)  3.5 - 5.0 g/dL Final   AST 93/83/7974 109 (H)  15 - 41 U/L Final   ALT 02/11/2024 95 (H)  0 - 44 U/L Final   Alkaline Phosphatase 02/11/2024 72  38 - 126 U/L Final   Total Bilirubin 02/11/2024 0.7  0.0 - 1.2  mg/dL Final   GFR, Estimated 02/11/2024 >60  >60 mL/min Final   Comment: (NOTE) Calculated using the CKD-EPI Creatinine Equation (2021)    Anion gap 02/11/2024 8  5 - 15 Final   Performed at [facility], [address] [place], [place] [address]   HCV RNA Qnt(log copy/mL) 02/11/2024 6.778  log10 IU/mL Corrected   HepC Qn 02/11/2024 6,000,000  IU/mL Final   Test Information 02/11/2024 Comment   Final   Comment: (NOTE) The quantitative range of this assay is 15 IU/mL to 100 million IU/mL.    Hcv Genotype 02/11/2024 Comment   Final   Comment: (NOTE) To be performed on this specimen. Performed At: [facility] [address] [place], [place] [id number] Jennette Shorter MD Ey:[id number]    aPTT 02/11/2024 37 (H)  24 - 36 seconds Final   Comment:        IF BASELINE aPTT IS ELEVATED, SUGGEST PATIENT RISK ASSESSMENT BE USED TO DETERMINE APPROPRIATE ANTICOAGULANT THERAPY. Performed at [facility], [address] [place], [place] [address]    Prothrombin Time 02/11/2024 15.4 (H)  11.4 - 15.2 seconds Final   INR 02/11/2024 1.2  0.8 - 1.2 Final   Comment: (NOTE) INR goal varies based on device and disease states. Performed at [facility], [address] [place], [place] [address]    Fibrinogen  02/11/2024 524 (H)  210 - 475 mg/dL Final   Comment: (NOTE) Fibrinogen  results may be underestimated in patients receiving thrombolytic therapy. Performed at [facility], [address], [place], [place] [address]    TSH 02/11/2024 0.373  0.350 - 4.500 uIU/mL Final   Comment: Performed by a 3rd Generation assay with a functional sensitivity of <=0.01 uIU/mL. Performed at [facility], [address] [place], [place] [address]    WBC 02/12/2024 9.0  4.0 - 10.5 K/uL Final   RBC 02/12/2024 4.32  3.87 - 5.11 MIL/uL Final   Hemoglobin 02/12/2024 12.6  12.0 - 15.0 g/dL Final   HCT 93/82/7974 38.4  36.0 - 46.0 % Final   MCV 02/12/2024 88.9  80.0 -  100.0 fL Final   MCH 02/12/2024 29.2  26.0 - 34.0 pg Final   MCHC 02/12/2024 32.8  30.0 - 36.0 g/dL Final   RDW 93/82/7974 14.9  11.5 - 15.5 % Final   Platelets 02/12/2024 340  150 - 400 K/uL Final   nRBC 02/12/2024 0.0  0.0 - 0.2 % Final   Performed at [facility], [address], [place], [place] [address]   Sodium 02/12/2024 138  135 - 145 mmol/L Final  Potassium 02/12/2024 3.9  3.5 - 5.1 mmol/L Final   Chloride 02/12/2024 106  98 - 111 mmol/L Final   CO2 02/12/2024 22  22 - 32 mmol/L Final   Glucose, Bld 02/12/2024 147 (H)  70 - 99 mg/dL Final   Glucose reference range applies only to samples taken after fasting for at least 8 hours.   BUN 02/12/2024 10  6 - 20 mg/dL Final   Creatinine, Ser 02/12/2024 0.66  0.44 - 1.00 mg/dL Final   Calcium 93/82/7974 8.2 (L)  8.9 - 10.3 mg/dL Final   Total Protein 93/82/7974 5.9 (L)  6.5 - 8.1 g/dL Final   Albumin 93/82/7974 2.2 (L)  3.5 - 5.0 g/dL Final   AST 93/82/7974 133 (H)  15 - 41 U/L Final   ALT 02/12/2024 93 (H)  0 - 44 U/L Final   Alkaline Phosphatase 02/12/2024 63  38 - 126 U/L Final   Total Bilirubin 02/12/2024 0.4  0.0 - 1.2 mg/dL Final   GFR, Estimated 02/12/2024 >60  >60 mL/min Final   Comment: (NOTE) Calculated using the CKD-EPI Creatinine Equation (2021)    Anion gap 02/12/2024 10  5 - 15 Final   Performed at [facility], [address], [place], [place] [address]   Hgb A1c MFr Bld 02/12/2024 5.4  4.8 - 5.6 % Final   Comment: (NOTE) Diagnosis of Diabetes The following HbA1c ranges recommended by the American Diabetes Association (ADA) may be used as an aid in the diagnosis of diabetes mellitus.  Hemoglobin             Suggested A1C NGSP%              Diagnosis  <5.7                   Non Diabetic  5.7-6.4                Pre-Diabetic  >6.4                   Diabetic  <7.0                   Glycemic control for                       adults with diabetes.     Mean Plasma Glucose 02/12/2024 108.28   mg/dL Final   Performed at [facility], [address], [place], [place] [address]   Hepatitis C Genotype 02/11/2024 1a   Final   Comment: (NOTE) This test was developed and its performance characteristics determined by [facility]. It has not been cleared or approved by the Food and Drug Administration. Performed At: [facility] [address] [place], [place] [id number] Jennette Shorter MD Ey:[id number]     Blood Alcohol level:  Lab Results  Component Value Date   [facility] <15 07/08/2024    Metabolic Disorder Labs: Lab Results  Component Value Date   HGBA1C 5.5 07/08/2024   MPG 111.15 07/08/2024   MPG 108.28 02/12/2024   No results found for: PROLACTIN Lab Results  Component Value Date   CHOL 174 07/08/2024   TRIG 102 07/08/2024   HDL 42 07/08/2024   CHOLHDL 4.1 07/08/2024   VLDL 20 07/08/2024   LDLCALC 112 (H) 07/08/2024   LDLCALC 73 09/22/2020    Therapeutic Lab Levels: No results found for: LITHIUM No results found for: VALPROATE No results found for: CBMZ  Physical  Findings   GAD-7    Flowsheet Row Procedure visit from 11/01/2020 in Center for [facility] at [facility] Office Visit from 09/22/2020 in Center for [employer] at [facility] for Women Clinical Support from 06/21/2020 in [place] for [employer] at [facility] for Women Clinical Support from 04/05/2020 in [place] for [employer] at [facility] for [facility] Clinical Support from 01/19/2020 in Center for [employer] [facility] at [facility]  Total GAD-7 Score 7 2 10 8  0   PHQ2-9    Flowsheet Row ED from 07/10/2024 in [facility] ED from 07/08/2024 in [facility] Procedure visit from 11/01/2020 in Center for [employer] [facility] at [facility] for Women Office Visit from 09/22/2020 in Center for [employer] at [facility] for  Women Clinical Support from 06/21/2020 in Center for [employer] [facility] at [facility]  PHQ-2 Total Score 6 4 0 0 2  PHQ-9 Total Score 21 12 0 2 10   Flowsheet Row ED from 07/10/2024 in [facility] ED from 07/08/2024 in [facility] ED from 05/17/2024 in [facility] Emergency Department at [facility]  C-SSRS RISK CATEGORY No Risk No Risk No Risk     Musculoskeletal  Strength & Muscle Tone: within normal limits Gait & Station: normal Patient leans: N/A  Psychiatric Specialty Exam  Presentation  General Appearance:  Disheveled  Eye Contact: Good  Speech: Clear and Coherent  Speech Volume: Normal  Handedness: Right   Mood and Affect  Mood: Anxious  Affect: Congruent   Thought Process  Thought Processes: Coherent  Descriptions of Associations:Intact  Orientation:Full (Time, Place and Person)  Thought Content:Logical  Diagnosis of Schizophrenia or Schizoaffective disorder in past: No    Hallucinations:Hallucinations: None  Ideas of Reference:None  Suicidal Thoughts:Suicidal Thoughts: No  Homicidal Thoughts:Homicidal Thoughts: No   Sensorium  Memory: Immediate Fair; Recent Fair  Judgment: Fair  Insight: Fair   Chartered Certified Accountant: Fair  Attention Span: Fair  Recall: [employer] of Knowledge: Fair  Language: Fair   Psychomotor Activity  Psychomotor Activity: Psychomotor Activity: Normal   Assets  Assets: Communication Skills; Desire for Improvement; Resilience   Sleep  Sleep: Sleep: Good  Estimated Sleeping Duration (Last 24 Hours): 10.00-10.75 hours (Due to Daylight Saving Time, the durations displayed may not accurately represent documentation during the time change interval)  No data recorded  Physical Exam  Physical Exam ROS Blood pressure (!) 102/58, pulse 85, temperature 97.6 F (36.4 C), temperature source  Oral, resp. rate 13, SpO2 99%. There is no height or weight on file to calculate BMI.  Treatment Plan Summary: Daily contact with patient to assess and evaluate symptoms and progress in treatment and Medication management Continue multimodal treatment in [facility]. Maintain current medication regimen which has been well tolerated but consider simplification with consolidation of quetiapine  100/300 to quetiapine  ER 400mg . Unclear if transaminase elevation related to current regimen. Nitrofurantoin  course to be completed. Monitor for s/sxs of infection. Including repeat U/A with microscopy for hyphae versus empiric treatment with Diflucan for presumptive post-abx candida/yeast. Continue Suboxone  8/2mg  once daily dosing but patient would like treatment moved to mid-morning. Reasonable approach given once daily dosing. PEG prn and SenokotS BID ordered for tx-emergent constipation. Suboxone  treatment may have an impact on facilities available for stepdown. Anticipate discharge to transitional placement in coming days followed by anticipated  admission to [facility] on 12/1. Patient at high risk of relapse in absence of secure transition into substance treatment program.  KANDI JAYSON HAHN, MD 07/20/2024 5:54 PM

## 2024-07-20 NOTE — ED Notes (Signed)
 Pt agitated about Ensure nutrition supplement discontinued. She started screaming her voice out. Agitation medication administered.

## 2024-07-20 NOTE — Group Note (Signed)
 Group Topic: Wellness  Group Date: 07/20/2024 Start Time: 1200 End Time: 1230 Facilitators: Daved Tinnie HERO, RN  Department: [facility]  Number of Participants: 5  Group Focus: nursing group Treatment Modality:  Psychoeducation Interventions utilized were patient education Purpose: increase insight  Name: Morgan Donaldson Date of Birth: September 30, 1980  MR: [id number]    Level of Participation: moderate Quality of Participation: cooperative Interactions with others: gave feedback Mood/Affect: appropriate Triggers (if applicable): n/a Cognition: coherent/clear Progress: Gaining insight Response: pt says they will talk to psychiatrist to help cope with grief  Plan: patient will be encouraged to attend future RN education groups    Patients Problems:  Patient Active Problem List   Diagnosis Date Noted   Hyperthyroidism 07/16/2024   Opioid dependence with uncomplicated intoxication (HCC) 07/14/2024   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   Elevated liver function tests 07/14/2024   Opioid use with withdrawal (HCC) 07/14/2024   Polysubstance abuse (HCC) 07/10/2024   Pyosalpingitis 02/12/2024   Hepatitis C antibody detected 02/11/2024   PID (acute pelvic inflammatory disease) 02/10/2024   Gonorrhea 12/10/2023   Group A streptococcal infection 12/09/2023   IV drug abuse (HCC) 12/09/2023   Acute cystitis 12/09/2023   GAD (generalized anxiety disorder) 06/21/2020   Attention deficit hyperactivity disorder (ADHD), 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

## 2024-07-20 NOTE — ED Notes (Signed)
 Pt somewhat irritable, expressed dissatisfaction over not having preferred food choice for breakfast. Pt otherwise says she feel 'okay'. Pt reports last BM Friday, declined MOM. Pt denies si hi and avh-  verbal contract for safety provided. Medications reviewed, questions denied.

## 2024-07-20 NOTE — Group Note (Signed)
 Group Topic: Positive Affirmations  Group Date: 07/20/2024 Start Time: 0130 End Time: 0200 Facilitators: Lonzell Dwayne RAMAN, NT  Department: [facility]  Number of Participants: 4  Group Focus: chemical dependency education/ Anger Management Treatment Modality:  Individual Therapy Interventions utilized were clarification and support Purpose: explore maladaptive thinking, increase insight, and reinforce self-care  Name: Morgan Donaldson Date of Birth: 1980/10/30  MR: [id number]    Level of Participation: Patient did attend group Quality of Participation: supportive Interactions with others: gave feedback Mood/Affect: positive Triggers (if applicable): N/A Cognition: coherent/clear Progress: Moderate Response: Appropriate  Plan: follow-up needed  Patients Problems:  Patient Active Problem List   Diagnosis Date Noted   Hyperthyroidism 07/16/2024   Opioid dependence with uncomplicated intoxication (HCC) 07/14/2024   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   Elevated liver function tests 07/14/2024   Opioid use with withdrawal (HCC) 07/14/2024   Polysubstance abuse (HCC) 07/10/2024   Pyosalpingitis 02/12/2024   Hepatitis C antibody detected 02/11/2024   PID (acute pelvic inflammatory disease) 02/10/2024   Gonorrhea 12/10/2023   Group A streptococcal infection 12/09/2023   IV drug abuse (HCC) 12/09/2023   Acute cystitis 12/09/2023   GAD (generalized anxiety disorder) 06/21/2020   Attention deficit hyperactivity disorder (ADHD), 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

## 2024-07-20 NOTE — Group Note (Signed)
 Group Topic: Wellness  Group Date: 07/20/2024 Start Time: 2030 End Time: 2100 Facilitators: Anice Benton LABOR, NT  Department: [facility]  Number of Participants: 4  Group Focus: anxiety and check in Treatment Modality:  Individual Therapy Interventions utilized were support Purpose: express feelings and increase insight  Name: Morgan Donaldson Date of Birth: [birth year]  MR: [id number]    Level of Participation: active Quality of Participation: attentive and cooperative Interactions with others: gave feedback Mood/Affect: appropriate Triggers (if applicable): N/A Cognition: insightful Progress: Moderate Response: good Plan: follow-up needed  Patients Problems:  Patient Active Problem List   Diagnosis Date Noted   Hyperthyroidism 07/16/2024   Opioid dependence with uncomplicated intoxication (HCC) 07/14/2024   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   Elevated liver function tests 07/14/2024   Opioid use with withdrawal (HCC) 07/14/2024   Polysubstance abuse (HCC) 07/10/2024   Pyosalpingitis 02/12/2024   Hepatitis C antibody detected 02/11/2024   PID (acute pelvic inflammatory disease) 02/10/2024   Gonorrhea 12/10/2023   Group A streptococcal infection 12/09/2023   IV drug abuse (HCC) 12/09/2023   Acute cystitis 12/09/2023   GAD (generalized anxiety disorder) 06/21/2020   Attention deficit hyperactivity disorder (ADHD), 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

## 2024-07-21 DIAGNOSIS — F411 Generalized anxiety disorder: Secondary | ICD-10-CM | POA: Diagnosis not present

## 2024-07-21 DIAGNOSIS — F1924 Other psychoactive substance dependence with psychoactive substance-induced mood disorder: Secondary | ICD-10-CM | POA: Diagnosis not present

## 2024-07-21 DIAGNOSIS — F1420 Cocaine dependence, uncomplicated: Secondary | ICD-10-CM | POA: Diagnosis not present

## 2024-07-21 DIAGNOSIS — F11220 Opioid dependence with intoxication, uncomplicated: Secondary | ICD-10-CM | POA: Diagnosis not present

## 2024-07-21 NOTE — Group Note (Signed)
 Group Topic: Communication  Group Date: 07/21/2024 Start Time: 1930 End Time: 2000 Facilitators: Verdon Jacqualyn BRAVO, NT  Department: [facility]  Number of Participants: 10  Group Focus: communication Treatment Modality:  Individual Therapy Interventions utilized were clarification Purpose: express feelings  Name: Morgan Donaldson Date of Birth: 01-Mar-1981  MR: [id number]    Level of Participation: active Quality of Participation: cooperative Interactions with others: gave feedback Mood/Affect: appropriate Triggers (if applicable): n/a Cognition: coherent/clear Progress: Moderate Response: n/a Plan: follow-up needed  Patients Problems:  Patient Active Problem List   Diagnosis Date Noted   Hyperthyroidism 07/16/2024   Opioid dependence with uncomplicated intoxication (HCC) 07/14/2024   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   Elevated liver function tests 07/14/2024   Opioid use with withdrawal (HCC) 07/14/2024   Polysubstance abuse (HCC) 07/10/2024   Pyosalpingitis 02/12/2024   Hepatitis C antibody detected 02/11/2024   PID (acute pelvic inflammatory disease) 02/10/2024   Gonorrhea 12/10/2023   Group A streptococcal infection 12/09/2023   IV drug abuse (HCC) 12/09/2023   Acute cystitis 12/09/2023   GAD (generalized anxiety disorder) 06/21/2020   Attention deficit hyperactivity disorder (ADHD), 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

## 2024-07-21 NOTE — ED Notes (Signed)
 Pt is sleeping. No acute distress noted. Respirations are even and labored. Q15 safety checks in place.

## 2024-07-21 NOTE — ED Notes (Signed)
 Pt presents as somewhat flat, but cooperative and pleasant interaction. Pt currently attending AA group, currently appearing drowsy. Pt denied si hi and avh- verbal contract for safety provided. Pt reported feeling sleepy after breakfast this morning, pt denied physical pain and discomforts. Medications reviewed, questions denied.

## 2024-07-21 NOTE — ED Provider Notes (Signed)
 FBC/OBS ASAP Discharge Summary  Date and Time: 07/21/2024 5:08 PM  Name: Morgan Donaldson  MRN:  [id number]   Discharge Diagnoses:  Final diagnoses:  Polysubstance abuse (HCC)  Opioid dependence with uncomplicated intoxication (HCC)  Drug-induced mood disorder (HCC)  Elevated liver function tests  Opioid use with withdrawal (HCC)  Hyperthyroidism  Generalized anxiety disorder    Subjective: Morgan Donaldson is  a 43 year old female who presented  to [facility] with a history of Opioid Use Disorder, severe and Stimulant Use Disorder, cocaine type, severe along with untreated Bipolar Disorder.  She presents  presents voluntarily for assessment and treatment. She is unaccompanied. . She states she is here to detox from crack cocaine and fentanyl .  Per chart review, she  presented in May 2025 for the same purpose, and she stated  she left before being admitted I just wasn't ready then. Reported  that  she is ready now.  Patient reported she has been using 1/2 gram of fentanyl  IV for the past year.  She has also been smoking approximately 1/2 gram of crack cocaine daily for the past year.  Patient stated she is done with using, stating she had this realization when she woke up [place] a bridge this morning.  Patient has been homeless for the past year.  She reported  she has a boyfriend, and things are going well outside of patient's substance use issues.  Patient stated her boyfriend is supportive of her seeking treatment and will allow her to stay with him once she is clean and has completed treatment.  Patient reported  she is also estranged from her sister and other family members due to her ongoing issues with substance use.  She is hopeful that after treatment she can reconnect with family.      Patient reported a psychiatric hx of Bipolar Disorder and she can remember that she was prescribed Seroquel  in the far past. She was not taking any  medications prior to coming here. She presented  with  withdrawal symptoms including  cramps, runny nose, increased fatigue, dry mouth, nausea.  Patient reported  she is looking for rehab services. Has a boyfriend who told her she can come home when she is sober.      Stay Summary: Patient  was admitted to Observation unit  and Clonidine  detox protocol initiated. Plan was to  transfer to [facility] when bed available.  Per nursing, last night, patient complained of nausea and vomiting and was medicated. She otherwise has been getting rest and her appetite is good.    Patient is evaluated face-to-face by this provider. Chart reviewed today 07/10/2024. Morgan Donaldson is seen in bed, awake. She continues to appear disheveled, anxious, restless, unkept. She reports that last night was horrible. Reports she had episodes of abdominal discomfort involving nausea and vomiting. She reports no additional complications. She denies SI/HI/AVH. Her thought process is coherent and goal-directed: reports she still wants to go to rehab upon detox completion. She reports that her appetite is good, and sleep is improving.  Patient  reported that she wants to get back on Seroquel , that it was helping when she was taking it.  No additional concerns reported.    Patient continues to express motivation for treatment and rehab. She is admitted to [facility] by Dr Lawrnce to continue treatment-detox protocol. She is encouraged to discuss her after care plan with the social worker.   Stay Summary: ***  Total Time spent with patient: {Time; 15 min - 8  hours:17441}  Past Psychiatric History: *** Past Medical History: *** Family History: *** Family Psychiatric History: *** Social History: *** Tobacco Cessation:  {Discharge tobacco cessation prescription:304700209}  Current Medications:  Current Facility-Administered Medications  Medication Dose Route Frequency Provider Last Rate Last Admin   acetaminophen  (TYLENOL ) tablet 650 mg  650 mg Oral Q6H PRN Randall Starlyn HERO, NP   650 mg at  07/13/24 0836   alum & mag hydroxide-simeth (MAALOX/MYLANTA) 200-200-20 MG/5ML suspension 30 mL  30 mL Oral Q4H PRN Onuoha, Chinwendu V, NP   30 mL at 07/15/24 2106   buprenorphine -naloxone  (SUBOXONE ) 8-2 mg per SL tablet 1 tablet  1 tablet Sublingual Q0600 Cole Kandi BROCKS, MD   1 tablet at 07/21/24 9074   busPIRone  (BUSPAR ) tablet 20 mg  20 mg Oral BID Cole Kandi BROCKS, MD   20 mg at 07/21/24 1511   haloperidol  (HALDOL ) tablet 5 mg  5 mg Oral TID PRN Randall Starlyn HERO, NP   5 mg at 07/20/24 2019   And   diphenhydrAMINE  (BENADRYL ) capsule 50 mg  50 mg Oral TID PRN Randall Starlyn HERO, NP   50 mg at 07/20/24 2019   haloperidol  lactate (HALDOL ) injection 5 mg  5 mg Intramuscular TID PRN Randall Starlyn HERO, NP       And   diphenhydrAMINE  (BENADRYL ) injection 50 mg  50 mg Intramuscular TID PRN Randall Starlyn HERO, NP       And   LORazepam  (ATIVAN ) injection 2 mg  2 mg Intramuscular TID PRN Randall Starlyn HERO, NP       haloperidol  lactate (HALDOL ) injection 10 mg  10 mg Intramuscular TID PRN Randall Starlyn HERO, NP       And   diphenhydrAMINE  (BENADRYL ) injection 50 mg  50 mg Intramuscular TID PRN Randall Starlyn HERO, NP       And   LORazepam  (ATIVAN ) injection 2 mg  2 mg Intramuscular TID PRN Randall, Damya Comley M, NP       feeding supplement (ENSURE PLUS HIGH PROTEIN) liquid 237 mL  237 mL Oral BID BM Cole Kandi BROCKS, MD   237 mL at 07/21/24 1511   magnesium  hydroxide (MILK OF MAGNESIA) suspension 30 mL  30 mL Oral Daily PRN Randall Starlyn HERO, NP   30 mL at 07/19/24 2108   nicotine  (NICODERM CQ  - dosed in mg/24 hours) patch 21 mg  21 mg Transdermal Daily Randall, Camira Geidel M, NP   21 mg at 07/21/24 9074   nicotine  polacrilex (NICORETTE ) gum 2 mg  2 mg Oral PRN Gottfried, Rhoda J, MD   2 mg at 07/19/24 1801   polyethylene glycol (MIRALAX  / GLYCOLAX ) packet 17 g  17 g Oral Daily PRN Bethea, Terrence C, MD   17 g at 07/20/24 1818   QUEtiapine  (SEROQUEL ) tablet 100 mg   100 mg Oral BH-q7a Bethea, Terrence C, MD   100 mg at 07/21/24 9177   QUEtiapine  (SEROQUEL ) tablet 300 mg  300 mg Oral QHS Bethea, Terrence C, MD   300 mg at 07/20/24 2102   senna-docusate (Senokot-S) tablet 1 tablet  1 tablet Oral BID Cole Kandi BROCKS, MD   1 tablet at 07/21/24 9073   traZODone  (DESYREL ) tablet 100 mg  100 mg Oral QHS PRN Randall Starlyn HERO, NP   100 mg at 07/20/24 2102   No current outpatient medications on file.    PTA Medications:  Facility Ordered Medications  Medication   [COMPLETED] ondansetron  (ZOFRAN ) tablet 4 mg   acetaminophen  (TYLENOL ) tablet  650 mg   magnesium  hydroxide (MILK OF MAGNESIA) suspension 30 mL   traZODone  (DESYREL ) tablet 100 mg   [COMPLETED] cloNIDine  (CATAPRES ) tablet 0.1 mg   Followed by   [COMPLETED] cloNIDine  (CATAPRES ) tablet 0.1 mg   Followed by   [COMPLETED] cloNIDine  (CATAPRES ) tablet 0.1 mg   [EXPIRED] dicyclomine  (BENTYL ) tablet 20 mg   [EXPIRED] loperamide  (IMODIUM ) capsule 2-4 mg   [EXPIRED] methocarbamol  (ROBAXIN ) tablet 500 mg   [EXPIRED] naproxen  (NAPROSYN ) tablet 500 mg   [EXPIRED] ondansetron  (ZOFRAN -ODT) disintegrating tablet 4 mg   nicotine  (NICODERM CQ  - dosed in mg/24 hours) patch 21 mg   haloperidol  (HALDOL ) tablet 5 mg   And   diphenhydrAMINE  (BENADRYL ) capsule 50 mg   haloperidol  lactate (HALDOL ) injection 5 mg   And   diphenhydrAMINE  (BENADRYL ) injection 50 mg   And   LORazepam  (ATIVAN ) injection 2 mg   haloperidol  lactate (HALDOL ) injection 10 mg   And   diphenhydrAMINE  (BENADRYL ) injection 50 mg   And   LORazepam  (ATIVAN ) injection 2 mg   nicotine  polacrilex (NICORETTE ) gum 2 mg   [EXPIRED] hydrOXYzine  (ATARAX ) tablet 50 mg   [COMPLETED] medroxyPROGESTERone  (DEPO-PROVERA ) injection 150 mg   [COMPLETED] nitrofurantoin  (macrocrystal-monohydrate) (MACROBID ) capsule 100 mg   QUEtiapine  (SEROQUEL ) tablet 100 mg   alum & mag hydroxide-simeth (MAALOX/MYLANTA) 200-200-20 MG/5ML suspension 30 mL   busPIRone   (BUSPAR ) tablet 20 mg   QUEtiapine  (SEROQUEL ) tablet 300 mg   [COMPLETED] buprenorphine -naloxone  (SUBOXONE ) 8-2 mg per SL tablet 1 tablet   buprenorphine -naloxone  (SUBOXONE ) 8-2 mg per SL tablet 1 tablet   feeding supplement (ENSURE PLUS HIGH PROTEIN) liquid 237 mL   polyethylene glycol (MIRALAX  / GLYCOLAX ) packet 17 g   senna-docusate (Senokot-S) tablet 1 tablet       07/21/2024    5:07 PM 07/13/2024    8:23 AM 07/11/2024   11:23 AM  Depression screen PHQ 2/9  Decreased Interest 1 3 1   Down, Depressed, Hopeless 1 3 1   PHQ - 2 Score 2 6 2   Altered sleeping 1 3 2   Tired, decreased energy 1 3 1   Change in appetite 0 3 0  Feeling bad or failure about yourself  1 2 1   Trouble concentrating 1 2 1   Moving slowly or fidgety/restless 1 2 0  Suicidal thoughts 0 0 0  PHQ-9 Score 7 21 7   Difficult doing work/chores Very difficult  Very difficult    Flowsheet Row ED from 07/10/2024 in [facility] ED from 07/08/2024 in [facility] ED from [facility]/17/2024 in [facility] Department at [facility]  C-SSRS RISK CATEGORY No Risk No Risk No Risk    Musculoskeletal  Strength & Muscle Tone: {desc; muscle tone:32375} Gait & Station: {PE GAIT ED WJUO:77474} Patient leans: {Patient Leans:21022755}  Psychiatric Specialty Exam  Presentation  General Appearance:  Casual  Eye Contact: Fair  Speech: Clear and Coherent  Speech Volume: Normal  Handedness: Right   Mood and Affect  Mood: Anxious  Affect: Congruent   Thought Process  Thought Processes: Coherent  Descriptions of Associations:Intact  Orientation:Full (Time, Place and Person)  Thought Content:WDL  Diagnosis of Schizophrenia or Schizoaffective disorder in past: No    Hallucinations:Hallucinations: None  Ideas of Reference:None  Suicidal Thoughts:Suicidal Thoughts: No  Homicidal Thoughts:Homicidal Thoughts: No   Sensorium   Memory: Immediate Fair; Recent Fair  Judgment: Fair  Insight: Fair   Art Therapist  Concentration: Fair  Attention Span: Fair  Recall: [employer] of  Knowledge: Fair  Language: Fair   Psychomotor Activity  Psychomotor Activity: Psychomotor Activity: Normal   Assets  Assets: Communication Skills; Desire for Improvement; Physical Health   Sleep  Sleep: Sleep: Fair  Estimated Sleeping Duration (Last 24 Hours): 10.00-10.50 hours (Due to Daylight Saving Time, the durations displayed may not accurately represent documentation during the time change interval)  Nutritional Assessment (For OBS and [facility] admissions only) Has the patient had a weight loss or gain of 10 pounds or more in the last 3 months?: No Does the patient have dental problems?: No Does the patient have eating habits or behaviors that may be indicators of an eating disorder including binging or inducing vomiting?: No Has the patient recently lost weight without trying?: 0 Has the patient been eating poorly because of a decreased appetite?: 0 Malnutrition Screening Tool Score: 0    Physical Exam  Physical Exam ROS Blood pressure (!) 106/56, pulse 78, temperature 98.6 F (37 C), temperature source Oral, resp. rate 17, height 5' 7 (1.702 m), weight 142 lb (64.4 kg), SpO2 98%. Body mass index is 22.24 kg/m.  Demographic Factors:  {Demographic Factors:20662}  Loss Factors: {Loss Factors:20659}  Historical Factors: {Historical Factors:20660}  Risk Reduction Factors:   {Risk Reduction Factors:20661}  Continued Clinical Symptoms:  {Clinical Factors:22706}  Cognitive Features That Contribute To Risk:  {chl bhh Cognitive Features:304700251}    Suicide Risk:  {BHH SUICIDE MPDX:77295}  Plan Of Care/Follow-up recommendations:  {BHH DC FU RECOMMENDATIONS:22620}  Disposition: PIERRETTE Randall Bouquet, NP 07/21/2024, 5:08 PM

## 2024-07-21 NOTE — ED Notes (Signed)
 Pt eating dinner in dayroom, no signs of distress observed.

## 2024-07-21 NOTE — ED Notes (Signed)
Pt did attend AA group  

## 2024-07-21 NOTE — Group Note (Signed)
 Group Topic: Wellness  Group Date: 07/21/2024 Start Time: 1200 End Time: 1230 Facilitators: Daved Tinnie HERO, RN  Department: [facility]  Number of Participants: 8  Group Focus: psychiatric education Treatment Modality:  Psychoeducation Interventions utilized were patient education Purpose: improve communication skills  Name: Morgan Donaldson Date of Birth: 1981-06-11  MR: [id number]    Level of Participation: active Quality of Participation: cooperative, attentive Interactions with others: gave feedback Mood/Affect: appropriate Triggers (if applicable): n/a Cognition: coherent/clear Progress: Gaining insight Response: to improve and maintain healthy boundaries, pt says they will evaluate her feelings Plan: patient will be encouraged to attend future RN education groups  Patients Problems:  Patient Active Problem List   Diagnosis Date Noted   Hyperthyroidism 07/16/2024   Opioid dependence with uncomplicated intoxication (HCC) 07/14/2024   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   Elevated liver function tests 07/14/2024   Opioid use with withdrawal (HCC) 07/14/2024   Polysubstance abuse (HCC) 07/10/2024   Pyosalpingitis 02/12/2024   Hepatitis C antibody detected 02/11/2024   PID (acute pelvic inflammatory disease) 02/10/2024   Gonorrhea 12/10/2023   Group A streptococcal infection 12/09/2023   IV drug abuse (HCC) 12/09/2023   Acute cystitis 12/09/2023   GAD (generalized anxiety disorder) 06/21/2020   Attention deficit hyperactivity disorder (ADHD), 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

## 2024-07-21 NOTE — Group Note (Signed)
 Group Topic: Overcoming Obstacles  Group Date: 07/21/2024 Start Time: 1300 End Time: 1345 Facilitators: Alyse Leilani LABOR, NT  Department: [facility]  Number of Participants: 9  Group Focus: chemical dependency education, chemical dependency issues, and goals/reality orientation Treatment Modality:  Skills Training Interventions utilized were group exercise, patient education, and problem solving Purpose: enhance coping skills, improve communication skills, increase insight, and relapse prevention strategies  Name: Morgan Donaldson Date of Birth: Jun 09, 1981  MR: [id number]    Pt did not attend group Patients Problems:  Patient Active Problem List   Diagnosis Date Noted   Hyperthyroidism 07/16/2024   Opioid dependence with uncomplicated intoxication (HCC) 07/14/2024   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   Elevated liver function tests 07/14/2024   Opioid use with withdrawal (HCC) 07/14/2024   Polysubstance abuse (HCC) 07/10/2024   Pyosalpingitis 02/12/2024   Hepatitis C antibody detected 02/11/2024   PID (acute pelvic inflammatory disease) 02/10/2024   Gonorrhea 12/10/2023   Group A streptococcal infection 12/09/2023   IV drug abuse (HCC) 12/09/2023   Acute cystitis 12/09/2023   GAD (generalized anxiety disorder) 06/21/2020   Attention deficit hyperactivity disorder (ADHD), 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

## 2024-07-21 NOTE — Care Management (Signed)
 FBC Care Management ...  PSS and Writer were unable to secure housing in the interim before patients scheduled appoint with [facility] 07/28/2024  Patient meeting with peer support at present moment  Patient will discharge to community [facility] on 07/22/2024  7 day supply and scripts

## 2024-07-21 NOTE — ED Notes (Signed)
Patient sleeping with no s/s of distress.

## 2024-07-21 NOTE — ED Notes (Signed)
 RN spoke with patient A&Ox4. Denies intent to harm self/others when asked. Denies A/VH or any physical complaints when asked. No acute distress noted. Active listening, support and encouragement provided. Routine safety checks conducted according to facility protocol. Encouraged patient to notify staff if thoughts of harm toward self or others arise. Patient verbalize understanding and agreement.

## 2024-07-22 DIAGNOSIS — F11220 Opioid dependence with intoxication, uncomplicated: Secondary | ICD-10-CM | POA: Diagnosis not present

## 2024-07-22 DIAGNOSIS — F1924 Other psychoactive substance dependence with psychoactive substance-induced mood disorder: Secondary | ICD-10-CM | POA: Diagnosis not present

## 2024-07-22 DIAGNOSIS — F1420 Cocaine dependence, uncomplicated: Secondary | ICD-10-CM | POA: Diagnosis not present

## 2024-07-22 DIAGNOSIS — F411 Generalized anxiety disorder: Secondary | ICD-10-CM | POA: Diagnosis not present

## 2024-07-22 MED ORDER — SENNOSIDES-DOCUSATE SODIUM 8.6-50 MG PO TABS: 1.0000 | ORAL_TABLET | Freq: Two times a day (BID) | ORAL | 0 refills | Status: DC

## 2024-07-22 MED ORDER — BUSPIRONE HCL 10 MG PO TABS: 20.0000 mg | ORAL_TABLET | Freq: Two times a day (BID) | ORAL | 0 refills | Status: DC

## 2024-07-22 MED ORDER — POLYETHYLENE GLYCOL 3350 17 G PO PACK: 17.0000 g | PACK | Freq: Every day | ORAL | 0 refills | Status: DC | PRN

## 2024-07-22 MED ORDER — ENSURE PLUS HIGH PROTEIN PO LIQD: 237.0000 mL | Freq: Two times a day (BID) | ORAL | 0 refills | Status: DC

## 2024-07-22 MED ORDER — TRAZODONE HCL 100 MG PO TABS: 100.0000 mg | ORAL_TABLET | Freq: Every evening | ORAL | 0 refills | Status: DC | PRN

## 2024-07-22 MED ORDER — QUETIAPINE FUMARATE 100 MG PO TABS: 100.0000 mg | ORAL_TABLET | ORAL | 0 refills | Status: DC

## 2024-07-22 MED ORDER — NICOTINE 21 MG/24HR TD PT24: 21.0000 mg | MEDICATED_PATCH | Freq: Every day | TRANSDERMAL | 0 refills | Status: DC

## 2024-07-22 MED ORDER — BUPRENORPHINE HCL-NALOXONE HCL 8-2 MG SL SUBL: 1.0000 | SUBLINGUAL_TABLET | Freq: Every day | SUBLINGUAL | 0 refills | Status: DC

## 2024-07-22 MED ORDER — QUETIAPINE FUMARATE 300 MG PO TABS: 300.0000 mg | ORAL_TABLET | Freq: Every day | ORAL | 0 refills | Status: DC

## 2024-07-22 NOTE — ED Notes (Signed)
 Patient discharged home per provider order. After Visit Summary (AVS) printed and given to patient, as well as printed prescriptions. AVS reviewed with patient and all questions fully answered. Patient discharged in no acute distress, A& O x4 and ambulatory. Patient denied SI/HI, A/VH upon discharge. Patient verbalized understanding of all discharge instructions explained by staff, including follow up appointments, RX's and safety plan. Patient mood fair. Patient belongings returned to patient from locker #27 complete and intact. Patient escorted to lobby via staff for transport to destination. Safety maintained.

## 2024-07-22 NOTE — ED Notes (Signed)
 Patient alert & oriented x4. Denies intent to harm self or others when asked. Denies A/VH. Patient denies any physical complaints when asked. No acute distress noted. Support and encouragement provided. Patient observed in milieu. No inappropriate behaviors observed or reported. Routine safety checks conducted per facility protocol. Encouraged patient to notify staff if any thoughts of harm towards self or others arise. Patient verbalizes understanding and agreement.

## 2024-07-22 NOTE — Care Management (Addendum)
 FBC Care Management...   Addendum 8:44 am...  Writer met with patient...  Per patient she has worked out her Suboxone  with Harlene (PSS) to get re-connected with [facility] Stops  Writer coordinated inpatient treatment ...  Patient scheduled to discharge 11/25/205  Patient will discharge to...  [facility] [address] [place], [place] [address] Phone: [phone]   RN will arrange transportation  Patient has been accepted to [facility] appointment scheduled for 07/28/2024 @ 9:00 am  [facility] [address] [place], [place] [address] [phone]  Writer coordinated appointment with Harlene, Peer Support Specialist to transport patient to [facility]   7 day samples and scripts

## 2024-07-22 NOTE — ED Notes (Signed)
Patient sleeping; no s/s of distress.

## 2024-07-28 ENCOUNTER — Other Ambulatory Visit (HOSPITAL_COMMUNITY)
Admission: EM | Admit: 2024-07-28 | Discharge: 2024-08-01 | Disposition: A | Source: Intra-hospital | Attending: Psychiatry | Admitting: Psychiatry

## 2024-07-28 ENCOUNTER — Ambulatory Visit (HOSPITAL_COMMUNITY)
Admission: EM | Admit: 2024-07-28 | Discharge: 2024-07-28 | Disposition: A | Discharge status: Other Acute Inpatient Hospital | Attending: Licensed Clinical Social Worker | Admitting: Licensed Clinical Social Worker

## 2024-07-28 DIAGNOSIS — Z5900 Homelessness unspecified: Secondary | ICD-10-CM | POA: Diagnosis not present

## 2024-07-28 DIAGNOSIS — E0590 Thyrotoxicosis, unspecified without thyrotoxic crisis or storm: Secondary | ICD-10-CM | POA: Diagnosis not present

## 2024-07-28 DIAGNOSIS — F11229 Opioid dependence with intoxication, unspecified: Secondary | ICD-10-CM | POA: Insufficient documentation

## 2024-07-28 DIAGNOSIS — R748 Abnormal levels of other serum enzymes: Secondary | ICD-10-CM

## 2024-07-28 DIAGNOSIS — Z205 Contact with and (suspected) exposure to viral hepatitis: Secondary | ICD-10-CM | POA: Diagnosis not present

## 2024-07-28 DIAGNOSIS — F1914 Other psychoactive substance abuse with psychoactive substance-induced mood disorder: Secondary | ICD-10-CM | POA: Insufficient documentation

## 2024-07-28 DIAGNOSIS — F1990 Other psychoactive substance use, unspecified, uncomplicated: Secondary | ICD-10-CM

## 2024-07-28 DIAGNOSIS — F1124 Opioid dependence with opioid-induced mood disorder: Secondary | ICD-10-CM | POA: Diagnosis not present

## 2024-07-28 DIAGNOSIS — F331 Major depressive disorder, recurrent, moderate: Secondary | ICD-10-CM | POA: Diagnosis not present

## 2024-07-28 DIAGNOSIS — F909 Attention-deficit hyperactivity disorder, unspecified type: Secondary | ICD-10-CM | POA: Diagnosis not present

## 2024-07-28 DIAGNOSIS — F411 Generalized anxiety disorder: Secondary | ICD-10-CM | POA: Diagnosis not present

## 2024-07-28 DIAGNOSIS — F1123 Opioid dependence with withdrawal: Secondary | ICD-10-CM | POA: Diagnosis not present

## 2024-07-28 DIAGNOSIS — F1410 Cocaine abuse, uncomplicated: Secondary | ICD-10-CM | POA: Diagnosis not present

## 2024-07-28 DIAGNOSIS — R7989 Other specified abnormal findings of blood chemistry: Secondary | ICD-10-CM | POA: Insufficient documentation

## 2024-07-28 DIAGNOSIS — F1910 Other psychoactive substance abuse, uncomplicated: Secondary | ICD-10-CM | POA: Diagnosis not present

## 2024-07-28 DIAGNOSIS — F1490 Cocaine use, unspecified, uncomplicated: Secondary | ICD-10-CM

## 2024-07-28 DIAGNOSIS — F1290 Cannabis use, unspecified, uncomplicated: Secondary | ICD-10-CM

## 2024-07-28 DIAGNOSIS — F1120 Opioid dependence, uncomplicated: Secondary | ICD-10-CM

## 2024-07-28 DIAGNOSIS — R7401 Elevation of levels of liver transaminase levels: Secondary | ICD-10-CM | POA: Diagnosis not present

## 2024-07-28 MED ORDER — NICOTINE 21 MG/24HR TD PT24
21.0000 mg | MEDICATED_PATCH | Freq: Every day | TRANSDERMAL | Status: DC
  Administered 2024-07-29 – 2024-08-01 (×4): 21 mg via TRANSDERMAL
  Filled 2024-07-28 (×5): qty 1

## 2024-07-28 MED ORDER — HYDROXYZINE HCL 25 MG PO TABS
25.0000 mg | ORAL_TABLET | Freq: Four times a day (QID) | ORAL | Status: DC | PRN
  Administered 2024-07-30: 25 mg via ORAL
  Filled 2024-07-28: qty 1

## 2024-07-28 MED ORDER — CLONIDINE HCL 0.1 MG PO TABS: 0.1000 mg | ORAL_TABLET | Freq: Every day | ORAL | Status: DC

## 2024-07-28 MED ORDER — DIPHENHYDRAMINE HCL 50 MG/ML IJ SOLN: 50.0000 mg | Freq: Three times a day (TID) | INTRAMUSCULAR | Status: DC | PRN

## 2024-07-28 MED ORDER — LORAZEPAM 2 MG/ML IJ SOLN: 2.0000 mg | Freq: Three times a day (TID) | INTRAMUSCULAR | Status: DC | PRN

## 2024-07-28 MED ORDER — DIPHENHYDRAMINE HCL 50 MG PO CAPS: 50.0000 mg | ORAL_CAPSULE | Freq: Three times a day (TID) | ORAL | Status: DC | PRN

## 2024-07-28 MED ORDER — ACETAMINOPHEN 325 MG PO TABS: 650.0000 mg | ORAL_TABLET | Freq: Four times a day (QID) | ORAL | Status: DC | PRN

## 2024-07-28 MED ORDER — METHOCARBAMOL 500 MG PO TABS
500.0000 mg | ORAL_TABLET | Freq: Three times a day (TID) | ORAL | Status: DC | PRN
  Administered 2024-07-28 – 2024-07-29 (×2): 500 mg via ORAL
  Filled 2024-07-28 (×2): qty 1

## 2024-07-28 MED ORDER — HALOPERIDOL LACTATE 5 MG/ML IJ SOLN: 10.0000 mg | Freq: Three times a day (TID) | INTRAMUSCULAR | Status: DC | PRN

## 2024-07-28 MED ORDER — NAPROXEN 500 MG PO TABS
500.0000 mg | ORAL_TABLET | Freq: Two times a day (BID) | ORAL | Status: DC | PRN
  Administered 2024-07-29 – 2024-07-31 (×3): 500 mg via ORAL
  Filled 2024-07-28 (×3): qty 1

## 2024-07-28 MED ORDER — ENSURE PLUS HIGH PROTEIN PO LIQD
237.0000 mL | Freq: Two times a day (BID) | ORAL | Status: DC
  Administered 2024-07-29 – 2024-08-01 (×7): 237 mL via ORAL

## 2024-07-28 MED ORDER — MAGNESIUM HYDROXIDE 400 MG/5ML PO SUSP: 30.0000 mL | Freq: Every day | ORAL | Status: DC | PRN

## 2024-07-28 MED ORDER — DICYCLOMINE HCL 20 MG PO TABS: 20.0000 mg | ORAL_TABLET | Freq: Four times a day (QID) | ORAL | Status: DC | PRN

## 2024-07-28 MED ORDER — LOPERAMIDE HCL 2 MG PO CAPS: 2.0000 mg | ORAL_CAPSULE | ORAL | Status: DC | PRN

## 2024-07-28 MED ORDER — HALOPERIDOL LACTATE 5 MG/ML IJ SOLN: 5.0000 mg | Freq: Three times a day (TID) | INTRAMUSCULAR | Status: DC | PRN

## 2024-07-28 MED ORDER — ALUM & MAG HYDROXIDE-SIMETH 200-200-20 MG/5ML PO SUSP: 30.0000 mL | ORAL | Status: DC | PRN

## 2024-07-28 MED ORDER — CLONIDINE HCL 0.1 MG PO TABS
0.1000 mg | ORAL_TABLET | ORAL | Status: AC
  Administered 2024-07-31 – 2024-08-01 (×2): 0.1 mg via ORAL
  Filled 2024-07-28 (×2): qty 1

## 2024-07-28 MED ORDER — ONDANSETRON 4 MG PO TBDP: 4.0000 mg | ORAL_TABLET | Freq: Four times a day (QID) | ORAL | Status: DC | PRN

## 2024-07-28 MED ORDER — CLONIDINE HCL 0.1 MG PO TABS
0.1000 mg | ORAL_TABLET | Freq: Four times a day (QID) | ORAL | Status: AC
  Administered 2024-07-28 – 2024-07-31 (×10): 0.1 mg via ORAL
  Filled 2024-07-28 (×10): qty 1

## 2024-07-28 MED ORDER — TRAZODONE HCL 50 MG PO TABS
50.0000 mg | ORAL_TABLET | Freq: Every evening | ORAL | Status: DC | PRN
  Administered 2024-07-28 – 2024-07-30 (×3): 50 mg via ORAL
  Filled 2024-07-28 (×3): qty 1

## 2024-07-28 MED ORDER — HALOPERIDOL 5 MG PO TABS: 5.0000 mg | ORAL_TABLET | Freq: Three times a day (TID) | ORAL | Status: DC | PRN

## 2024-07-28 NOTE — ED Provider Notes (Incomplete)
 Facility Based Crisis Admission H&P  Date: 07/28/24 Patient Name: Morgan Donaldson MRN: [id number] Chief Complaint: ***  Diagnoses:  Final diagnoses:  Polysubstance abuse (HCC)  Fentanyl  use disorder, severe, dependence (HCC)  Crack cocaine use  Marijuana user  IV drug user    HPI: Lou L. Vitiello is a 43 year old female with psychiatric history of   PHQ 2-9:  Flowsheet Row ED from 07/10/2024 in [facility] ED from 07/08/2024 in [facility] Procedure visit from 11/01/[facility]  Thoughts that you would be better off dead, or of hurting yourself in some way Not at all Not at all Not at all  PHQ-9 Total Score 7 12 0    Flowsheet Row ED from 07/10/2024 in [facility] ED from 07/08/2024 in [facility] ED from 05/17/2024 in [facility] Emergency Department at [facility]  C-SSRS RISK CATEGORY No Risk No Risk No Risk      Total Time spent with patient: {Time; 15 min - 8 hours:17441}  Musculoskeletal  Strength & Muscle Tone: {desc; muscle tone:32375} Gait & Station: {PE GAIT ED WJUO:77474} Patient leans: {Patient Leans:[id number]}  Psychiatric Specialty Exam  Presentation General Appearance:  Disheveled  Eye Contact: Poor  Speech: Clear and Coherent  Speech Volume: Decreased  Handedness: Right   Mood and Affect  Mood: Anxious  Affect: Congruent   Thought Process  Thought Processes: Coherent  Descriptions of Associations:Intact  Orientation:Partial  Thought Content:WDL  Diagnosis of Schizophrenia or Schizoaffective disorder in past: No   Hallucinations:Hallucinations: None  Ideas of Reference:None  Suicidal Thoughts:Suicidal Thoughts: No  Homicidal Thoughts:Homicidal Thoughts: No   Sensorium  Memory: Immediate Fair  Judgment: Poor  Insight: Poor   Executive  Functions  Concentration: Poor  Attention Span: Poor  Recall: Fair  Fund of Knowledge: Poor  Language: Poor   Psychomotor Activity  Psychomotor Activity: Psychomotor Activity: Normal   Assets  Assets: Manufacturing Systems Engineer; Desire for Improvement   Sleep  Sleep: Sleep: Fair   Nutritional Assessment (For OBS and FBC admissions only) Has the patient had a weight loss or gain of 10 pounds or more in the last 3 months?: No Has the patient had a decrease in food intake/or appetite?: No Does the patient have dental problems?: No Does the patient have eating habits or behaviors that may be indicators of an eating disorder including binging or inducing vomiting?: No Has the patient recently lost weight without trying?: 0 Has the patient been eating poorly because of a decreased appetite?: 0 Malnutrition Screening Tool Score: 0    Physical Exam Constitutional:      General: She is not in acute distress.    Appearance: She is not diaphoretic.  HENT:     Nose: No congestion.  Pulmonary:     Effort: No respiratory distress.  Chest:     Chest wall: No tenderness.  Neurological:     Mental Status: She is alert. Mental status is at baseline.  Psychiatric:        Attention and Perception: Attention and perception normal.        Mood and Affect: Mood is depressed. Affect is flat.        Speech: Speech normal.        Behavior: Behavior is cooperative.        Thought Content: Thought content normal.    Review of Systems  Constitutional:  Negative for chills, diaphoresis and fever.  Eyes:  Negative for discharge.  Respiratory:  Negative for cough, shortness of breath and wheezing.   Cardiovascular:  Negative for chest pain and palpitations.  Gastrointestinal:  Negative for diarrhea, nausea and vomiting.  Neurological:  Negative for speech change and seizures.  Psychiatric/Behavioral:  Positive for depression and substance abuse.     Past Psychiatric History: See H  & P   Is the patient at risk to self? Yes  Has the patient been a risk to self in the past 6 months? Yes .    Has the patient been a risk to self within the distant past? Yes   Is the patient a risk to others? No   Has the patient been a risk to others in the past 6 months? No   Has the patient been a risk to others within the distant past? No   Past Medical History: See Chart Family History: N/A Social History: N/A  Last Labs:  Admission on 07/10/2024, Discharged on 07/22/2024  Component Date Value Ref Range Status  . T3, Free 07/11/2024 2.2  2.0 - 4.4 pg/mL Final   Comment: (NOTE) Performed At: [facility] [address] [place], [place] [id number] Jennette Shorter MD Ey:[id number]   . Free T4 07/11/2024 0.71  0.61 - 1.12 ng/dL Final   Comment: (NOTE) Biotin ingestion may interfere with free T4 tests. If the results are inconsistent with the TSH level, previous test results, or the clinical presentation, then consider biotin interference. If needed, order repeat testing after stopping biotin. Performed at [facility], [address], [place], [place] [address]   . Neisseria Gonorrhea 07/12/2024 Negative   Final  . Chlamydia 07/12/2024 Negative   Final  . Comment 07/12/2024 Normal Reference Ranger Chlamydia - Negative   Final  . Comment 07/12/2024 Normal Reference Range Neisseria Gonorrhea - Negative   Final  . Total Protein 07/12/2024 7.1  6.5 - 8.1 g/dL Final  . Albumin 88/84/7974 3.3 (L)  3.5 - 5.0 g/dL Final  . AST 88/84/7974 67 (H)  15 - 41 U/L Final  . ALT 07/12/2024 95 (H)  0 - 44 U/L Final  . Alkaline Phosphatase 07/12/2024 110  38 - 126 U/L Final  . Total Bilirubin 07/12/2024 0.6  0.0 - 1.2 mg/dL Final  . Bilirubin, Direct 07/12/2024 <0.1  0.0 - 0.2 mg/dL Final  . Indirect Bilirubin 07/12/2024 NOT CALCULATED  0.3 - 0.9 mg/dL Final   Performed at [facility], [address] [place], [place] [address]  . RPR Ser Ql 07/12/2024 NON REACTIVE  NON  REACTIVE Final   Performed at [facility], [address], [place], [place] [address]  . [facility] test code 07/12/2024 [id number]   Final  . [facility] test name 07/12/2024 [id number]   Final  . Source ([facility]) 07/12/2024 SERUM   Final   Performed at [facility], [address] [place], [place] [address]  . Misc [facility] result 07/12/2024 COMMENT   Final   Comment: (NOTE) Test Ordered: [id number] HIV Ab/p24 Ag with Reflex HIV Ab/p24 Ag Screen           Note:                     BN     Non Reactive  Reference Range: Non Reactive                          HIV-1/HIV-2 antibodies and HIV-1 p24 antigen were NOT detected. There is no laboratory evidence of HIV infection. HIV Negative Performed At: [facility] [address] [place], [place] [id number] Jennette Shorter MD Ey:[id number]   . Color, Urine 07/19/2024 YELLOW  YELLOW Final  . APPearance 07/19/2024 CLOUDY (A)  CLEAR Final  . Specific Gravity, Urine 07/19/2024 1.018  1.005 - 1.030 Final  . pH 07/19/2024 6.0  5.0 - 8.0 Final  . Glucose, UA 07/19/2024 NEGATIVE  NEGATIVE mg/dL Final  . Hgb urine dipstick 07/19/2024 NEGATIVE  NEGATIVE Final  . Bilirubin Urine 07/19/2024 NEGATIVE  NEGATIVE Final  . Ketones, ur 07/19/2024 NEGATIVE  NEGATIVE mg/dL Final  . Protein, ur 88/77/7974 NEGATIVE  NEGATIVE mg/dL Final  . Nitrite 88/77/7974 NEGATIVE  NEGATIVE Final  . Ave Mcmurray 07/19/2024 LARGE (A)  NEGATIVE Final  . RBC / HPF 07/19/2024 0-5  0 - 5 RBC/hpf Final  . WBC, UA 07/19/2024 11-20  0 - 5 WBC/hpf Final  . Bacteria, UA 07/19/2024 RARE (A)  NONE SEEN Final  . Squamous Epithelial / HPF 07/19/2024 0-5  0 - 5 /HPF Final  . Mucus 07/19/2024 PRESENT   Final   Performed at [facility], [address], [place], [place] [address]  . Sodium 07/19/2024 139  135 - 145 mmol/L Final  . Potassium 07/19/2024 4.0  3.5 - 5.1 mmol/L Final  . Chloride 07/19/2024 103  98 - 111 mmol/L Final  .  CO2 07/19/2024 25  22 - 32 mmol/L Final  . Glucose, Bld 07/19/2024 99  70 - 99 mg/dL Final   Glucose reference range applies only to samples taken after fasting for at least 8 hours.  . BUN 07/19/2024 22 (H)  6 - 20 mg/dL Final  . Creatinine, Ser 07/19/2024 0.78  0.44 - 1.00 mg/dL Final  . Calcium 88/77/7974 8.8 (L)  8.9 - 10.3 mg/dL Final  . Total Protein 07/19/2024 7.0  6.5 - 8.1 g/dL Final  . Albumin 88/77/7974 3.3 (L)  3.5 - 5.0 g/dL Final  . AST 88/77/7974 115 (H)  15 - 41 U/L Final  . ALT 07/19/2024 184 (H)  0 - 44 U/L Final  . Alkaline Phosphatase 07/19/2024 72  38 - 126 U/L Final  . Total Bilirubin 07/19/2024 0.4  0.0 - 1.2 mg/dL Final  . GFR, Estimated 07/19/2024 >60  >60 mL/min Final   Comment: (NOTE) Calculated using the CKD-EPI Creatinine Equation (2021)   . Anion gap 07/19/2024 11  5 - 15 Final   Performed at [facility], [address] [place], [place] [address]  . WBC 07/19/2024 10.6 (H)  4.0 - 10.5 K/uL Final  . RBC 07/19/2024 4.38  3.87 - 5.11 MIL/uL Final  . Hemoglobin 07/19/2024 12.7  12.0 - 15.0 g/dL Final  . HCT 88/77/7974 39.1  36.0 - 46.0 % Final  . MCV 07/19/2024 89.3  80.0 - 100.0 fL Final  . MCH 07/19/2024 29.0  26.0 - 34.0 pg Final  . MCHC 07/19/2024 32.5  30.0 - 36.0 g/dL Final  . RDW 88/77/7974 14.0  11.5 - 15.5 % Final  . Platelets 07/19/2024 305  150 - 400 K/uL Final  . nRBC 07/19/2024 0.0  0.0 - 0.2 % Final  . Neutrophils Relative % 07/19/2024 64  % Final  . Neutro Abs 07/19/2024 6.8  1.7 - 7.7 K/uL Final  . Lymphocytes Relative 07/19/2024 23  % Final  . Lymphs Abs 07/19/2024 2.4  0.7 - 4.0 K/uL Final  . Monocytes Relative 07/19/2024 7  % Final  . Monocytes Absolute 07/19/2024 0.8  0.1 - 1.0 K/uL Final  . Eosinophils Relative 07/19/2024 4  % Final  . Eosinophils Absolute 07/19/2024 0.4  0.0 - 0.5 K/uL Final  . Basophils Relative 07/19/2024 1  % Final  . Basophils Absolute 07/19/2024 0.1  0.0 - 0.1 K/uL Final  . Immature Granulocytes  07/19/2024 1  % Final  . Abs Immature Granulocytes 07/19/2024 0.07  0.00 - 0.07 K/uL Final   Performed at [facility], [address], [place], [place] [address]  Admission on 07/08/2024, Discharged on 07/10/2024  Component Date Value Ref Range Status  . WBC 07/08/2024 10.6 (H)  4.0 - 10.5 K/uL Final  . RBC 07/08/2024 5.65 (H)  3.87 - 5.11 MIL/uL Final  . Hemoglobin 07/08/2024 16.5 (H)  12.0 - 15.0 g/dL Final  . HCT 88/88/7974 49.4 (H)  36.0 - 46.0 % Final  . MCV 07/08/2024 87.4  80.0 - 100.0 fL Final  . MCH 07/08/2024 29.2  26.0 - 34.0 pg Final  . MCHC 07/08/2024 33.4  30.0 - 36.0 g/dL Final  . RDW 88/88/7974 13.7  11.5 - 15.5 % Final  . Platelets 07/08/2024 422 (H)  150 - 400 K/uL Final  . nRBC 07/08/2024 0.0  0.0 - 0.2 % Final  . Neutrophils Relative % 07/08/2024 75  % Final  . Neutro Abs 07/08/2024 7.9 (H)  1.7 - 7.7 K/uL Final  . Lymphocytes Relative 07/08/2024 17  % Final  . Lymphs Abs 07/08/2024 1.8  0.7 - 4.0 K/uL Final  . Monocytes Relative 07/08/2024 7  % Final  . Monocytes Absolute 07/08/2024 0.7  0.1 - 1.0 K/uL Final  . Eosinophils Relative 07/08/2024 0  % Final  . Eosinophils Absolute 07/08/2024 0.0  0.0 - 0.5 K/uL Final  . Basophils Relative 07/08/2024 1  % Final  . Basophils Absolute 07/08/2024 0.1  0.0 - 0.1 K/uL Final  . Immature Granulocytes 07/08/2024 0  % Final  . Abs Immature Granulocytes 07/08/2024 0.04  0.00 - 0.07 K/uL Final   Performed at [facility], [address], [place], [place] [address]  . Sodium 07/08/2024 140  135 - 145 mmol/L Final  . Potassium 07/08/2024 4.1  3.5 - 5.1 mmol/L Final  . Chloride 07/08/2024 100  98 - 111 mmol/L Final  . CO2 07/08/2024 27  22 - 32 mmol/L Final  . Glucose, Bld 07/08/2024 113 (H)  70 - 99 mg/dL Final   Glucose reference range applies only to samples taken after fasting for at least 8 hours.  . BUN 07/08/2024 10  6 - 20 mg/dL Final  . Creatinine, Ser 07/08/2024 0.74  0.44 - 1.00 mg/dL Final  . Calcium  88/88/7974 9.9  8.9 - 10.3 mg/dL Final  . Total Protein 07/08/2024 7.9  6.5 - 8.1 g/dL Final  . Albumin 88/88/7974 3.5  3.5 - 5.0 g/dL Final  . AST 88/88/7974 109 (H)  15 - 41 U/L Final  . ALT 07/08/2024 132 (H)  0 - 44 U/L Final  . Alkaline Phosphatase 07/08/2024 74  38 - 126 U/L Final  . Total Bilirubin 07/08/2024 0.3  0.0 - 1.2 mg/dL Final  . GFR, Estimated 07/08/2024 >60  >60 mL/min Final   Comment: (NOTE) Calculated using the CKD-EPI Creatinine Equation (  2021)   . Anion gap 07/08/2024 13  5 - 15 Final   Performed at [facility], [address], [place], [place] [address]  . Hgb A1c MFr Bld 07/08/2024 5.5  4.8 - 5.6 % Final   Comment: (NOTE) Diagnosis of Diabetes The following HbA1c ranges recommended by the American Diabetes Association (ADA) may be used as an aid in the diagnosis of diabetes mellitus.  Hemoglobin             Suggested A1C NGSP%              Diagnosis  <5.7                   Non Diabetic  5.7-6.4                Pre-Diabetic  >6.4                   Diabetic  <7.0                   Glycemic control for                       adults with diabetes.    . Mean Plasma Glucose 07/08/2024 111.15  mg/dL Final   Performed at [facility], [address] [place], [place] [address]  . Magnesium  07/08/2024 2.1  1.7 - 2.4 mg/dL Final   Performed at [facility], [address] [place], [place] [address]  . Alcohol, Ethyl (B) 07/08/2024 <15  <15 mg/dL Final   Comment: (NOTE) For medical purposes only. Performed at [facility], [address] [place], [place] [address]   . Cholesterol 07/08/2024 174  0 - 200 mg/dL Final  . Triglycerides 07/08/2024 102  <150 mg/dL Final  . HDL 88/88/7974 42  >40 mg/dL Final  . Total CHOL/HDL Ratio 07/08/2024 4.1  RATIO Final  . VLDL 07/08/2024 20  0 - 40 mg/dL Final  . LDL Cholesterol 07/08/2024 112 (H)  0 - 99 mg/dL Final   Comment:        Total Cholesterol/HDL:CHD Risk Coronary Heart Disease Risk  Table                     Men   Women  1/2 Average Risk   3.4   3.3  Average Risk       5.0   4.4  2 X Average Risk   9.6   7.1  3 X Average Risk  23.4   11.0        Use the calculated Patient Ratio above and the CHD Risk Table to determine the patient's CHD Risk.        ATP III CLASSIFICATION (LDL):  <100     mg/dL   Optimal  899-870  mg/dL   Near or Above                    Optimal  130-159  mg/dL   Borderline  839-810  mg/dL   High  >809     mg/dL   Very High Performed at [facility], [address] [place], [place] [address]   . Color, Urine 07/09/2024 YELLOW  YELLOW Final  . APPearance 07/09/2024 TURBID (A)  CLEAR Final  . Specific Gravity, Urine 07/09/2024 1.017  1.005 - 1.030 Final  . pH 07/09/2024 7.0  5.0 - 8.0  Final  . Glucose, UA 07/09/2024 NEGATIVE  NEGATIVE mg/dL Final  . Hgb urine dipstick 07/09/2024 NEGATIVE  NEGATIVE Final  . Bilirubin Urine 07/09/2024 NEGATIVE  NEGATIVE Final  . Ketones, ur 07/09/2024 NEGATIVE  NEGATIVE mg/dL Final  . Protein, ur 88/87/7974 NEGATIVE  NEGATIVE mg/dL Final  . Nitrite 88/87/7974 NEGATIVE  NEGATIVE Final  . Leukocytes,Ua 07/09/2024 MODERATE (A)  NEGATIVE Final  . RBC / HPF 07/09/2024 0-5  0 - 5 RBC/hpf Final  . WBC, UA 07/09/2024 21-50  0 - 5 WBC/hpf Final  . Bacteria, UA 07/09/2024 MANY (A)  NONE SEEN Final  . Squamous Epithelial / HPF 07/09/2024 0-5  0 - 5 /HPF Final  . WBC Clumps 07/09/2024 PRESENT   Final   Performed at [facility], [address], [place], [place] [address]  . Preg Test, Ur 07/09/2024 Negative  Negative Final  . POC Amphetamine  UR 07/09/2024 None Detected  NONE DETECTED (Cut Off Level 1000 ng/mL) Final  . POC Secobarbital (BAR) 07/09/2024 None Detected  NONE DETECTED (Cut Off Level 300 ng/mL) Final  . POC Buprenorphine  (BUP) 07/09/2024 None Detected  NONE DETECTED (Cut Off Level 10 ng/mL) Final  . POC Oxazepam (BZO) 07/09/2024 None Detected  NONE DETECTED (Cut Off Level 300 ng/mL) Final  .  POC Cocaine UR 07/09/2024 Positive (A)  NONE DETECTED (Cut Off Level 300 ng/mL) Final  . POC Methamphetamine UR 07/09/2024 None Detected  NONE DETECTED (Cut Off Level 1000 ng/mL) Final  . POC Morphine  07/09/2024 Positive (A)  NONE DETECTED (Cut Off Level 300 ng/mL) Final  . POC Methadone UR 07/09/2024 None Detected  NONE DETECTED (Cut Off Level 300 ng/mL) Final  . POC Oxycodone  UR 07/09/2024 None Detected  NONE DETECTED (Cut Off Level 100 ng/mL) Final  . POC Marijuana UR 07/09/2024 Positive (A)  NONE DETECTED (Cut Off Level 50 ng/mL) Final  . TSH 07/08/2024 0.112 (L)  0.350 - 4.500 uIU/mL Final   Comment: Performed by a 3rd Generation assay with a functional sensitivity of <=0.01 uIU/mL. Performed at [facility], [address], [place], [place] [address]   . Preg Test, Ur 07/09/2024 NEGATIVE  NEGATIVE Final   Comment:        THE SENSITIVITY OF THIS METHODOLOGY IS >20 mIU/mL.   Admission on 05/17/2024, Discharged on 05/18/2024  Component Date Value Ref Range Status  . WBC 05/17/2024 9.0  4.0 - 10.5 K/uL Final  . RBC 05/17/2024 4.35  3.87 - 5.11 MIL/uL Final  . Hemoglobin 05/17/2024 12.8  12.0 - 15.0 g/dL Final  . HCT 90/79/7974 39.7  36.0 - 46.0 % Final  . MCV 05/17/2024 91.3  80.0 - 100.0 fL Final  . MCH 05/17/2024 29.4  26.0 - 34.0 pg Final  . MCHC 05/17/2024 32.2  30.0 - 36.0 g/dL Final  . RDW 90/79/7974 14.1  11.5 - 15.5 % Final  . Platelets 05/17/2024 286  150 - 400 K/uL Final  . nRBC 05/17/2024 0.0  0.0 - 0.2 % Final  . Neutrophils Relative % 05/17/2024 65  % Final  . Neutro Abs 05/17/2024 5.8  1.7 - 7.7 K/uL Final  . Lymphocytes Relative 05/17/2024 23  % Final  . Lymphs Abs 05/17/2024 2.1  0.7 - 4.0 K/uL Final  . Monocytes Relative 05/17/2024 8  % Final  . Monocytes Absolute 05/17/2024 0.8  0.1 - 1.0 K/uL Final  . Eosinophils Relative 05/17/2024 4  % Final  . Eosinophils Absolute 05/17/2024 0.3  0.0 - 0.5 K/uL  Final  . Basophils Relative 05/17/2024 0  % Final  .  Basophils Absolute 05/17/2024 0.0  0.0 - 0.1 K/uL Final  . Immature Granulocytes 05/17/2024 0  % Final  . Abs Immature Granulocytes 05/17/2024 0.04  0.00 - 0.07 K/uL Final   Performed at [facility], [address], [place], [place] [address]  . Preg, Serum 05/17/2024 NEGATIVE  NEGATIVE Final   Comment:        THE SENSITIVITY OF THIS METHODOLOGY IS >10 mIU/mL. Performed at [facility], [address] [place], [place] [address]   . Sodium 05/17/2024 141  135 - 145 mmol/L Final  . Potassium 05/17/2024 3.5  3.5 - 5.1 mmol/L Final  . Chloride 05/17/2024 104  98 - 111 mmol/L Final  . CO2 05/17/2024 26  22 - 32 mmol/L Final  . Glucose, Bld 05/17/2024 118 (H)  70 - 99 mg/dL Final   Glucose reference range applies only to samples taken after fasting for at least 8 hours.  . BUN 05/17/2024 11  6 - 20 mg/dL Final  . Creatinine, Ser 05/17/2024 0.78  0.44 - 1.00 mg/dL Final  . Calcium 90/79/7974 8.8 (L)  8.9 - 10.3 mg/dL Final  . Total Protein 05/17/2024 7.0  6.5 - 8.1 g/dL Final  . Albumin 90/79/7974 3.0 (L)  3.5 - 5.0 g/dL Final  . AST 90/79/7974 115 (H)  15 - 41 U/L Final  . ALT 05/17/2024 144 (H)  0 - 44 U/L Final  . Alkaline Phosphatase 05/17/2024 71  38 - 126 U/L Final  . Total Bilirubin 05/17/2024 0.5  0.0 - 1.2 mg/dL Final  . GFR, Estimated 05/17/2024 >60  >60 mL/min Final   Comment: (NOTE) Calculated using the CKD-EPI Creatinine Equation (2021)   . Anion gap 05/17/2024 11  5 - 15 Final   Performed at [facility], [address] [place], [place] [address]  . Sodium 05/17/2024 142  135 - 145 mmol/L Final  . Potassium 05/17/2024 3.5  3.5 - 5.1 mmol/L Final  . Chloride 05/17/2024 103  98 - 111 mmol/L Final  . BUN 05/17/2024 12  6 - 20 mg/dL Final  . Creatinine, Ser 05/17/2024 0.70  0.44 - 1.00 mg/dL Final  . Glucose, Bld 90/79/7974 120 (H)  70 - 99 mg/dL Final   Glucose reference range applies only to samples taken after fasting for at least 8 hours.  . Calcium,  Ion 05/17/2024 1.13 (L)  1.15 - 1.40 mmol/L Final  . TCO2 05/17/2024 28  22 - 32 mmol/L Final  . Hemoglobin 05/17/2024 11.6 (L)  12.0 - 15.0 g/dL Final  . HCT 90/79/7974 34.0 (L)  36.0 - 46.0 % Final  Admission on 03/25/2024, Discharged on 03/25/2024  Component Date Value Ref Range Status  . Sodium 03/25/2024 140  135 - 145 mmol/L Final  . Potassium 03/25/2024 3.2 (L)  3.5 - 5.1 mmol/L Final  . Chloride 03/25/2024 105  98 - 111 mmol/L Final  . BUN 03/25/2024 9  6 - 20 mg/dL Final  . Creatinine, Ser 03/25/2024 0.70  0.44 - 1.00 mg/dL Final  . Glucose, Bld 92/70/7974 132 (H)  70 - 99 mg/dL Final   Glucose reference range applies only to samples taken after fasting for at least 8 hours.  . Calcium, Ion 03/25/2024 0.99 (L)  1.15 - 1.40 mmol/L Final  . TCO2 03/25/2024 24  22 - 32 mmol/L Final  . Hemoglobin 03/25/2024 12.9  12.0 - 15.0 g/dL  Final  . HCT 03/25/2024 38.0  36.0 - 46.0 % Final  . Lactic Acid, Venous 03/25/2024 3.2 (HH)  0.5 - 1.9 mmol/L Final  . Comment 03/25/2024 NOTIFIED PHYSICIAN   Final  Admission on 02/10/2024, Discharged on 02/12/2024  Component Date Value Ref Range Status  . WBC 02/10/2024 16.8 (H)  4.0 - 10.5 K/uL Final  . RBC 02/10/2024 4.46  3.87 - 5.11 MIL/uL Final  . Hemoglobin 02/10/2024 12.7  12.0 - 15.0 g/dL Final  . HCT 93/84/7974 39.5  36.0 - 46.0 % Final  . MCV 02/10/2024 88.6  80.0 - 100.0 fL Final  . MCH 02/10/2024 28.5  26.0 - 34.0 pg Final  . MCHC 02/10/2024 32.2  30.0 - 36.0 g/dL Final  . RDW 93/84/7974 14.8  11.5 - 15.5 % Final  . Platelets 02/10/2024 331  150 - 400 K/uL Final  . nRBC 02/10/2024 0.0  0.0 - 0.2 % Final  . Neutrophils Relative % 02/10/2024 83  % Final  . Neutro Abs 02/10/2024 14.0 (H)  1.7 - 7.7 K/uL Final  . Lymphocytes Relative 02/10/2024 9  % Final  . Lymphs Abs 02/10/2024 1.6  0.7 - 4.0 K/uL Final  . Monocytes Relative 02/10/2024 6  % Final  . Monocytes Absolute 02/10/2024 1.0  0.1 - 1.0 K/uL Final  . Eosinophils Relative  02/10/2024 1  % Final  . Eosinophils Absolute 02/10/2024 0.1  0.0 - 0.5 K/uL Final  . Basophils Relative 02/10/2024 0  % Final  . Basophils Absolute 02/10/2024 0.1  0.0 - 0.1 K/uL Final  . Immature Granulocytes 02/10/2024 1  % Final  . Abs Immature Granulocytes 02/10/2024 0.08 (H)  0.00 - 0.07 K/uL Final   Performed at [facility] Lab, [address], [place], [place] [address]  . Sodium 02/10/2024 136  135 - 145 mmol/L Final  . Potassium 02/10/2024 3.9  3.5 - 5.1 mmol/L Final  . Chloride 02/10/2024 101  98 - 111 mmol/L Final  . CO2 02/10/2024 25  22 - 32 mmol/L Final  . Glucose, Bld 02/10/2024 94  70 - 99 mg/dL Final   Glucose reference range applies only to samples taken after fasting for at least 8 hours.  . BUN 02/10/2024 7  6 - 20 mg/dL Final  . Creatinine, Ser 02/10/2024 0.66  0.44 - 1.00 mg/dL Final  . Calcium 93/84/7974 8.7 (L)  8.9 - 10.3 mg/dL Final  . Total Protein 02/10/2024 6.8  6.5 - 8.1 g/dL Final  . Albumin 93/84/7974 3.2 (L)  3.5 - 5.0 g/dL Final  . AST 93/84/7974 215 (H)  15 - 41 U/L Final  . ALT 02/10/2024 143 (H)  0 - 44 U/L Final  . Alkaline Phosphatase 02/10/2024 82  38 - 126 U/L Final  . Total Bilirubin 02/10/2024 1.0  0.0 - 1.2 mg/dL Final  . GFR, Estimated 02/10/2024 >60  >60 mL/min Final   Comment: (NOTE) Calculated using the CKD-EPI Creatinine Equation (2021)   . Anion gap 02/10/2024 10  5 - 15 Final   Performed at [facility], [address] [place], [place] [address]  . Lipase 02/10/2024 25  11 - 51 U/L Final   Performed at [facility], [address], [place], [place] [address]  . Specimen Source 02/10/2024 URINE, CLEAN CATCH   Final  . Color, Urine 02/10/2024 YELLOW  YELLOW Final  . APPearance 02/10/2024 CLOUDY (A)  CLEAR Final  . Specific Gravity, Urine 02/10/2024 1.015  1.005 - 1.030  Final  . pH 02/10/2024 7.0  5.0 - 8.0 Final  . Glucose, UA 02/10/2024 NEGATIVE  NEGATIVE mg/dL Final  . Hgb urine dipstick 02/10/2024 NEGATIVE   NEGATIVE Final  . Bilirubin Urine 02/10/2024 NEGATIVE  NEGATIVE Final  . Ketones, ur 02/10/2024 NEGATIVE  NEGATIVE mg/dL Final  . Protein, ur 93/84/7974 30 (A)  NEGATIVE mg/dL Final  . Nitrite 93/84/7974 POSITIVE (A)  NEGATIVE Final  . Leukocytes,Ua 02/10/2024 MODERATE (A)  NEGATIVE Final  . RBC / HPF 02/10/2024 0-5  0 - 5 RBC/hpf Final  . WBC, UA 02/10/2024 21-50  0 - 5 WBC/hpf Final   Comment:        Reflex urine culture not performed if WBC <=10, OR if Squamous epithelial cells >5. If Squamous epithelial cells >5 suggest recollection.   . Bacteria, UA 02/10/2024 FEW (A)  NONE SEEN Final  . Squamous Epithelial / HPF 02/10/2024 0-5  0 - 5 /HPF Final  . Mucus 02/10/2024 PRESENT   Final  . Amorphous Crystal 02/10/2024 PRESENT   Final   Performed at [facility], [address] [place], [place] [address]  . Preg Test, Ur 02/10/2024 NEGATIVE  NEGATIVE Final   Comment:        THE SENSITIVITY OF THIS METHODOLOGY IS >25 mIU/mL. Performed at [facility], [address], [place], [place] [address]   . Specimen Description 02/10/2024 BLOOD RIGHT FOREARM   Final  . Special Requests 02/10/2024 BOTTLES DRAWN AEROBIC AND ANAEROBIC Blood Culture adequate volume   Final  . Culture 02/10/2024    Final                   Value:NO GROWTH 5 DAYS Performed at [facility], [address] [place], [place] [address]   . Report Status 02/10/2024 02/15/2024 FINAL   Final  . Specimen Description 02/10/2024 BLOOD LEFT ANTECUBITAL   Final  . Special Requests 02/10/2024 BOTTLES DRAWN AEROBIC AND ANAEROBIC Blood Culture results may not be optimal due to an inadequate volume of blood received in culture bottles   Final  . Culture 02/10/2024    Final                   Value:NO GROWTH 5 DAYS Performed at [facility], [address] [place], [place] [address]   . Report Status 02/10/2024 02/15/2024 FINAL   Final  . Neisseria Gonorrhea 02/10/2024 Positive (A)   Final  . Chlamydia  02/10/2024 Negative   Final  . Comment 02/10/2024 Normal Reference Ranger Chlamydia - Negative   Final  . Comment 02/10/2024 Normal Reference Range Neisseria Gonorrhea - Negative   Final  . Yeast Wet Prep HPF POC 02/10/2024 NONE SEEN  NONE SEEN Final  . Trich, Wet Prep 02/10/2024 NONE SEEN  NONE SEEN Final  . Clue Cells Wet Prep HPF POC 02/10/2024 NONE SEEN  NONE SEEN Final  . WBC, Wet Prep HPF POC 02/10/2024 >=10 (A)  <10 Final  . Sperm 02/10/2024 NONE SEEN   Final   Performed at [facility], [address] [place], [place] [address]  . HIV Screen 4th Generation wRfx 02/10/2024 Non Reactive  Non Reactive Final   Performed at [facility], [address], [place], [place] [address]  . RPR Ser Ql 02/10/2024 NON REACTIVE  NON REACTIVE Final   Performed at [facility], [address] [place], [place] [address]  . Hepatitis B Surface Ag  02/10/2024 NON REACTIVE  NON REACTIVE Final  . HCV Ab 02/10/2024 Reactive (A)  NON REACTIVE Final   Comment: (NOTE) The CDC recommends that a Reactive HCV antibody result be followed up  with a HCV Nucleic Acid Amplification test.    . Hep A IgM 02/10/2024 NON REACTIVE  NON REACTIVE Final  . Hep B C IgM 02/10/2024 NON REACTIVE  NON REACTIVE Final   Performed at [facility], [address], [place], [place] [address]  . Specimen Description 02/10/2024 URINE, RANDOM   Final  . Special Requests 02/10/2024    Final                   Value:NONE Reflexed from [id number] Performed at [facility], [address] [place], [place] [address]   . Culture 02/10/2024 MULTIPLE SPECIES PRESENT, SUGGEST RECOLLECTION (A)   Final  . Report Status 02/10/2024 02/11/2024 FINAL   Final  . WBC 02/11/2024 17.9 (H)  4.0 - 10.5 K/uL Final  . RBC 02/11/2024 4.32  3.87 - 5.11 MIL/uL Final  . Hemoglobin 02/11/2024 12.4  12.0 - 15.0 g/dL Final  . HCT 93/83/7974 38.1  36.0 - 46.0 % Final  . MCV 02/11/2024 88.2  80.0 - 100.0 fL Final  . MCH 02/11/2024  28.7  26.0 - 34.0 pg Final  . MCHC 02/11/2024 32.5  30.0 - 36.0 g/dL Final  . RDW 93/83/7974 14.9  11.5 - 15.5 % Final  . Platelets 02/11/2024 328  150 - 400 K/uL Final  . nRBC 02/11/2024 0.0  0.0 - 0.2 % Final   Performed at [facility], [address], [place], [place] [address]  . Sodium 02/11/2024 134 (L)  135 - 145 mmol/L Final  . Potassium 02/11/2024 4.1  3.5 - 5.1 mmol/L Final  . Chloride 02/11/2024 103  98 - 111 mmol/L Final  . CO2 02/11/2024 23  22 - 32 mmol/L Final  . Glucose, Bld 02/11/2024 245 (H)  70 - 99 mg/dL Final   Glucose reference range applies only to samples taken after fasting for at least 8 hours.  . BUN 02/11/2024 9  6 - 20 mg/dL Final  . Creatinine, Ser 02/11/2024 0.76  0.44 - 1.00 mg/dL Final  . Calcium 93/83/7974 7.9 (L)  8.9 - 10.3 mg/dL Final  . Total Protein 02/11/2024 5.7 (L)  6.5 - 8.1 g/dL Final  . Albumin 93/83/7974 2.3 (L)  3.5 - 5.0 g/dL Final  . AST 93/83/7974 109 (H)  15 - 41 U/L Final  . ALT 02/11/2024 95 (H)  0 - 44 U/L Final  . Alkaline Phosphatase 02/11/2024 72  38 - 126 U/L Final  . Total Bilirubin 02/11/2024 0.7  0.0 - 1.2 mg/dL Final  . GFR, Estimated 02/11/2024 >60  >60 mL/min Final   Comment: (NOTE) Calculated using the CKD-EPI Creatinine Equation (2021)   . Anion gap 02/11/2024 8  5 - 15 Final   Performed at [facility], [address] [place], [place] [address]  . HCV RNA Qnt(log copy/mL) 02/11/2024 6.778  log10 IU/mL Corrected  . HepC Qn 02/11/2024 6,000,000  IU/mL Final  . Test Information 02/11/2024 Comment   Final   Comment: (NOTE) The quantitative range of this assay is 15 IU/mL to 100 million IU/mL.   SABRA Hcv Genotype 02/11/2024 Comment   Final   Comment: (NOTE) To be performed on this specimen. Performed At: [facility] [address], [place] [id number] Nagendra Sanjai  MD Ey:[phone]   . aPTT 02/11/2024 37 (H)  24 - 36 seconds Final   Comment:        IF BASELINE aPTT IS  ELEVATED, SUGGEST PATIENT RISK ASSESSMENT BE USED TO DETERMINE APPROPRIATE ANTICOAGULANT THERAPY. Performed at [facility], [address], [place], [place] [address]   . Prothrombin Time 02/11/2024 15.4 (H)  11.4 - 15.2 seconds Final  . INR 02/11/2024 1.2  0.8 - 1.2 Final   Comment: (NOTE) INR goal varies based on device and disease states. Performed at [facility], [address] [place], [place] [address]   . Fibrinogen  02/11/2024 524 (H)  210 - 475 mg/dL Final   Comment: (NOTE) Fibrinogen  results may be underestimated in patients receiving thrombolytic therapy. Performed at [facility], [address] [place], [place] [address]   . TSH 02/11/2024 0.373  0.350 - 4.500 uIU/mL Final   Comment: Performed by a 3rd Generation assay with a functional sensitivity of <=0.01 uIU/mL. Performed at [facility], [address], [place], [place] [address]   . WBC 02/12/2024 9.0  4.0 - 10.5 K/uL Final  . RBC 02/12/2024 4.32  3.87 - 5.11 MIL/uL Final  . Hemoglobin 02/12/2024 12.6  12.0 - 15.0 g/dL Final  . HCT 93/82/7974 38.4  36.0 - 46.0 % Final  . MCV 02/12/2024 88.9  80.0 - 100.0 fL Final  . MCH 02/12/2024 29.2  26.0 - 34.0 pg Final  . MCHC 02/12/2024 32.8  30.0 - 36.0 g/dL Final  . RDW 93/82/7974 14.9  11.5 - 15.5 % Final  . Platelets 02/12/2024 340  150 - 400 K/uL Final  . nRBC 02/12/2024 0.0  0.0 - 0.2 % Final   Performed at [facility], [address], [place], [place] [address]  . Sodium 02/12/2024 138  135 - 145 mmol/L Final  . Potassium 02/12/2024 3.9  3.5 - 5.1 mmol/L Final  . Chloride 02/12/2024 106  98 - 111 mmol/L Final  . CO2 02/12/2024 22  22 - 32 mmol/L Final  . Glucose, Bld 02/12/2024 147 (H)  70 - 99 mg/dL Final   Glucose reference range applies only to samples taken after fasting for at least 8 hours.  . BUN 02/12/2024 10  6 - 20 mg/dL Final  . Creatinine, Ser 02/12/2024 0.66  0.44 - 1.00 mg/dL Final  . Calcium 93/82/7974 8.2 (L)   8.9 - 10.3 mg/dL Final  . Total Protein 02/12/2024 5.9 (L)  6.5 - 8.1 g/dL Final  . Albumin 93/82/7974 2.2 (L)  3.5 - 5.0 g/dL Final  . AST 93/82/7974 133 (H)  15 - 41 U/L Final  . ALT 02/12/2024 93 (H)  0 - 44 U/L Final  . Alkaline Phosphatase 02/12/2024 63  38 - 126 U/L Final  . Total Bilirubin 02/12/2024 0.4  0.0 - 1.2 mg/dL Final  . GFR, Estimated 02/12/2024 >60  >60 mL/min Final   Comment: (NOTE) Calculated using the CKD-EPI Creatinine Equation (2021)   . Anion gap 02/12/2024 10  5 - 15 Final   Performed at [facility], [address] [place], [place] [address]  . Hgb A1c MFr Bld 02/12/2024 5.4  4.8 - 5.6 % Final   Comment: (NOTE) Diagnosis of Diabetes The following HbA1c ranges recommended by the American Diabetes Association (ADA) may be used as an aid in the diagnosis of diabetes mellitus.  Hemoglobin  Suggested A1C NGSP%              Diagnosis  <5.7                   Non Diabetic  5.7-6.4                Pre-Diabetic  >6.4                   Diabetic  <7.0                   Glycemic control for                       adults with diabetes.    . Mean Plasma Glucose 02/12/2024 108.28  mg/dL Final   Performed at [facility], [address] [place], [place] [address]  . Hepatitis C Genotype 02/11/2024 1a   Final   Comment: (NOTE) This test was developed and its performance characteristics determined by [facility]. It has not been cleared or approved by the Food and Drug Administration. Performed At: [facility] [address] [place], [place] [id number] Jennette Shorter MD Ey:[phone]     Allergies: Patient has no known allergies.  Medications:  PTA Medications  Medication Sig  . buprenorphine -naloxone  (SUBOXONE ) 8-2 mg SUBL SL tablet Place 1 tablet under the tongue daily at 6 (six) AM.  . busPIRone  (BUSPAR ) 10 MG tablet Take 2 tablets (20 mg total) by mouth 2 (two) times daily.  . nicotine  (NICODERM CQ  - DOSED IN MG/24 HOURS) 21  mg/24hr patch Place 1 patch (21 mg total) onto the skin daily.  . QUEtiapine  (SEROQUEL ) 100 MG tablet Take 1 tablet (100 mg total) by mouth every morning.  . QUEtiapine  (SEROQUEL ) 300 MG tablet Take 1 tablet (300 mg total) by mouth at bedtime.  . traZODone  (DESYREL ) 100 MG tablet Take 1 tablet (100 mg total) by mouth at bedtime as needed for sleep.  . polyethylene glycol (MIRALAX  / GLYCOLAX ) 17 g packet Take 17 g by mouth daily as needed for mild constipation or moderate constipation.  . senna-docusate (SENOKOT-S) 8.6-50 MG tablet Take 1 tablet by mouth 2 (two) times daily.  . feeding supplement (ENSURE PLUS HIGH PROTEIN) LIQD Take 237 mLs by mouth 2 (two) times daily between meals.    Long Term Goals: Improvement in symptoms so as ready for discharge  Short Term Goals: Patient will verbalize feelings in meetings with treatment team members., Patient will attend at least of 50% of the groups daily., Pt will complete the PHQ9 on admission, day 3 and discharge., Patient will participate in completing the [place] Suicide Severity Rating Scale, Patient will score a low risk of violence for 24 hours prior to discharge, and Patient will take medications as prescribed daily.  Medical Decision Making  ***    Recommendations  Based on my evaluation the patient does not appear to have an emergency medical condition.  Thurman LULLA Ivans, NP 07/28/24  11:07 PM

## 2024-07-28 NOTE — ED Notes (Signed)
 Patient is calm and cooperative, only complaint is that she is hungry, no distress noted, will continue to monitor for safety

## 2024-07-28 NOTE — ED Provider Notes (Incomplete)
 Facility Based Crisis Admission H&P  Date: 07/29/24 Patient Name: Morgan Donaldson MRN: [id number] Chief Complaint:  I need detox  Diagnoses:  Final diagnoses:  Polysubstance abuse (HCC)  Fentanyl  use disorder, severe, dependence (HCC)  Marijuana user  IV drug user  Homelessness  Cocaine abuse (HCC)    HPI: Morgan Donaldson is a 43 year old female with psychiatric history of drug-induced mood disorder, opioid dependence with uncomplicated intoxication, IV drug abuse, ADHD, GAD, and polysubstance abuse, who presented voluntarily as a walk-in to [facility] requesting detox from fentanyl , and crack cocaine.  Patient was seen face to face by this provider and chart reviewed.  Per chart review, patient was recently admitted to the [facility] between 11/13-11/25 with similar complaint.   Patient reports she did not follow up with her discharge recommendations because I relapsed.  She states they sent me to rehab and I had to wait for a week, then I smoked a blunt which was laced with fentanyl  and I also had cocaine and weed and later I went to the rehab facility but I had fentanyl  in my system and I was rejected.  Tonight, patient reports I need detox from fentanyl , I've been using for a while, I quit then I started again, I last used 2 days ago. Patient endorses withdrawal symptoms which she describes as aching, nausea, and malaise.  Patient reports she is not established with an outpatient psychiatrist or therapist and currently not taking any medications.  Patient reports homelessness ongoing for a year. She is seeking detox.  Patient completed the PHQ-9 questionnaire and obtained a total score of 19, indicating moderately severe depression.  On evaluation, patient is alert, oriented at baseline, and cooperative. Speech is clear and coherent. Pt appears disheveled. Eye contact is poor. Mood is anxious, affect is congruent with mood. Thought process is coherent and thought content is WDL. Pt  denies SI/HI/AVH. There is no objective indication that the patient is responding to internal stimuli. No delusions elicited during this assessment.    Discussed recommendation for admission to the [facility] for substance abuse treatment/detox.   Patient is provided with opportunity for questions.  She verbalized understanding and is in agreement.   PHQ 2-9:  Flowsheet Row ED from 07/28/2024 in [facility] ED from 07/10/2024 in [facility] ED from 07/08/2024 in [facility]  Thoughts that you would be better off dead, or of hurting yourself in some way Not at all Not at all Not at all  PHQ-9 Total Score 19 7 12     Flowsheet Row ED from 07/28/2024 in [facility] ED from 07/10/2024 in [facility] ED from 07/08/2024 in [facility]  C-SSRS RISK CATEGORY No Risk No Risk No Risk      Total Time spent with patient: 30 minutes  Musculoskeletal  Strength & Muscle Tone: within normal limits Gait & Station: normal Patient leans: N/A  Psychiatric Specialty Exam  Presentation General Appearance:  Disheveled  Eye Contact: Poor  Speech: Clear and Coherent  Speech Volume: Decreased  Handedness: Right   Mood and Affect  Mood: Anxious  Affect: Congruent   Thought Process  Thought Processes: Coherent  Descriptions of Associations:Intact  Orientation:Partial  Thought Content:WDL  Diagnosis of Schizophrenia or Schizoaffective disorder in past: No   Hallucinations:Hallucinations: None  Ideas of Reference:None  Suicidal Thoughts:Suicidal Thoughts: No  Homicidal Thoughts:Homicidal Thoughts: No   Sensorium  Memory:  Immediate Fair  Judgment: Poor  Insight: Poor   Executive Functions  Concentration: Poor  Attention Span: Poor  Recall: Fair  Fund of  Knowledge: Poor  Language: Poor   Psychomotor Activity  Psychomotor Activity: Psychomotor Activity: Normal   Assets  Assets: Communication Skills; Desire for Improvement   Sleep  Sleep: Sleep: Fair   Nutritional Assessment (For OBS and FBC admissions only) Has the patient had a weight loss or gain of 10 pounds or more in the last 3 months?: No Has the patient had a decrease in food intake/or appetite?: No Does the patient have dental problems?: No Does the patient have eating habits or behaviors that may be indicators of an eating disorder including binging or inducing vomiting?: No Has the patient recently lost weight without trying?: 0 Has the patient been eating poorly because of a decreased appetite?: 0 Malnutrition Screening Tool Score: 0    Physical Exam Constitutional:      General: She is not in acute distress.    Appearance: She is not diaphoretic.  HENT:     Nose: No congestion.  Pulmonary:     Effort: No respiratory distress.  Chest:     Chest wall: No tenderness.  Neurological:     Mental Status: She is alert. Mental status is at baseline.  Psychiatric:        Attention and Perception: Attention and perception normal.        Mood and Affect: Mood is depressed. Affect is flat.        Speech: Speech normal.        Behavior: Behavior is cooperative.        Thought Content: Thought content normal.    Review of Systems  Constitutional:  Negative for chills, diaphoresis and fever.  Eyes:  Negative for discharge.  Respiratory:  Negative for cough, shortness of breath and wheezing.   Cardiovascular:  Negative for chest pain and palpitations.  Gastrointestinal:  Negative for diarrhea, nausea and vomiting.  Neurological:  Negative for speech change and seizures.  Psychiatric/Behavioral:  Positive for depression and substance abuse.     Past Psychiatric History: See H & P   Is the patient at risk to self? Yes  Has the patient been a risk to self in  the past 6 months? Yes .    Has the patient been a risk to self within the distant past? Yes   Is the patient a risk to others? No   Has the patient been a risk to others in the past 6 months? No   Has the patient been a risk to others within the distant past? No   Past Medical History: See Chart Family History: N/A Social History: N/A  Last Labs:  Admission on 07/10/2024, Discharged on 07/22/2024  Component Date Value Ref Range Status   T3, Free 07/11/2024 2.2  2.0 - 4.4 pg/mL Final   Comment: (NOTE) Performed At: [facility] [address] [place], [place] [id number] Jennette Shorter MD Ey:[id number]    Free T4 07/11/2024 0.71  0.61 - 1.12 ng/dL Final   Comment: (NOTE) Biotin ingestion may interfere with free T4 tests. If the results are inconsistent with the TSH level, previous test results, or the clinical presentation, then consider biotin interference. If needed, order repeat testing after stopping biotin. Performed at [facility], [address] [place], [place] [address]    Neisseria Gonorrhea 07/12/2024 Negative   Final   Chlamydia 07/12/2024 Negative   Final  Comment 07/12/2024 Normal Reference Ranger Chlamydia - Negative   Final   Comment 07/12/2024 Normal Reference Range Neisseria Gonorrhea - Negative   Final   Total Protein 07/12/2024 7.1  6.5 - 8.1 g/dL Final   Albumin 88/84/7974 3.3 (L)  3.5 - 5.0 g/dL Final   AST 88/84/7974 67 (H)  15 - 41 U/L Final   ALT 07/12/2024 95 (H)  0 - 44 U/L Final   Alkaline Phosphatase 07/12/2024 110  38 - 126 U/L Final   Total Bilirubin 07/12/2024 0.6  0.0 - 1.2 mg/dL Final   Bilirubin, Direct 07/12/2024 <0.1  0.0 - 0.2 mg/dL Final   Indirect Bilirubin 07/12/2024 NOT CALCULATED  0.3 - 0.9 mg/dL Final   Performed at [facility], [address] [place], [place] [address]   RPR Ser Ql 07/12/2024 NON REACTIVE  NON REACTIVE Final   Performed at [facility], [address] [place], [place] [address]    [facility] test code 07/12/2024 [id number]   Final   [facility] test name 07/12/2024 [id number]   Final   Source ([facility]) 07/12/2024 SERUM   Final   Performed at [facility], [address] [place], [place] [address]   Misc [facility] result 07/12/2024 COMMENT   Final   Comment: (NOTE) Test Ordered: [id number] HIV Ab/p24 Ag with Reflex HIV Ab/p24 Ag Screen           Note:                     BN     Non Reactive                                                  Reference Range: Non Reactive                          HIV-1/HIV-2 antibodies and HIV-1 p24 antigen were NOT detected. There is no laboratory evidence of HIV infection. HIV Negative Performed At: [facility] [address] [place], [place] [id number] Jennette Shorter MD Ey:[id number]    Color, Urine 07/19/2024 YELLOW  YELLOW Final   APPearance 07/19/2024 CLOUDY (A)  CLEAR Final   Specific Gravity, Urine 07/19/2024 1.018  1.005 - 1.030 Final   pH 07/19/2024 6.0  5.0 - 8.0 Final   Glucose, UA 07/19/2024 NEGATIVE  NEGATIVE mg/dL Final   Hgb urine dipstick 07/19/2024 NEGATIVE  NEGATIVE Final   Bilirubin Urine 07/19/2024 NEGATIVE  NEGATIVE Final   Ketones, ur 07/19/2024 NEGATIVE  NEGATIVE mg/dL Final   Protein, ur 88/77/7974 NEGATIVE  NEGATIVE mg/dL Final   Nitrite 88/77/7974 NEGATIVE  NEGATIVE Final   Leukocytes,Ua 07/19/2024 LARGE (A)  NEGATIVE Final   RBC / HPF 07/19/2024 0-5  0 - 5 RBC/hpf Final   WBC, UA 07/19/2024 11-20  0 - 5 WBC/hpf Final   Bacteria, UA 07/19/2024 RARE (A)  NONE SEEN Final   Squamous Epithelial / HPF 07/19/2024 0-5  0 - 5 /HPF Final   Mucus 07/19/2024 PRESENT   Final   Performed at [facility] Lab, [address], [place], [place] [address]   Sodium 07/19/2024 139  135 - 145 mmol/L Final   Potassium 07/19/2024 4.0  3.5 - 5.1 mmol/L Final   Chloride 07/19/2024 103  98 - 111 mmol/L Final  CO2 07/19/2024 25  22 - 32 mmol/L Final   Glucose, Bld 07/19/2024 99  70 - 99 mg/dL Final   Glucose reference range  applies only to samples taken after fasting for at least 8 hours.   BUN 07/19/2024 22 (H)  6 - 20 mg/dL Final   Creatinine, Ser 07/19/2024 0.78  0.44 - 1.00 mg/dL Final   Calcium 88/77/7974 8.8 (L)  8.9 - 10.3 mg/dL Final   Total Protein 88/77/7974 7.0  6.5 - 8.1 g/dL Final   Albumin 88/77/7974 3.3 (L)  3.5 - 5.0 g/dL Final   AST 88/77/7974 115 (H)  15 - 41 U/L Final   ALT 07/19/2024 184 (H)  0 - 44 U/L Final   Alkaline Phosphatase 07/19/2024 72  38 - 126 U/L Final   Total Bilirubin 07/19/2024 0.4  0.0 - 1.2 mg/dL Final   GFR, Estimated 07/19/2024 >60  >60 mL/min Final   Comment: (NOTE) Calculated using the CKD-EPI Creatinine Equation (2021)    Anion gap 07/19/2024 11  5 - 15 Final   Performed at [facility], [address], [place], [place] [address]   WBC 07/19/2024 10.6 (H)  4.0 - 10.5 K/uL Final   RBC 07/19/2024 4.38  3.87 - 5.11 MIL/uL Final   Hemoglobin 07/19/2024 12.7  12.0 - 15.0 g/dL Final   HCT 88/77/7974 39.1  36.0 - 46.0 % Final   MCV 07/19/2024 89.3  80.0 - 100.0 fL Final   MCH 07/19/2024 29.0  26.0 - 34.0 pg Final   MCHC 07/19/2024 32.5  30.0 - 36.0 g/dL Final   RDW 88/77/7974 14.0  11.5 - 15.5 % Final   Platelets 07/19/2024 305  150 - 400 K/uL Final   nRBC 07/19/2024 0.0  0.0 - 0.2 % Final   Neutrophils Relative % 07/19/2024 64  % Final   Neutro Abs 07/19/2024 6.8  1.7 - 7.7 K/uL Final   Lymphocytes Relative 07/19/2024 23  % Final   Lymphs Abs 07/19/2024 2.4  0.7 - 4.0 K/uL Final   Monocytes Relative 07/19/2024 7  % Final   Monocytes Absolute 07/19/2024 0.8  0.1 - 1.0 K/uL Final   Eosinophils Relative 07/19/2024 4  % Final   Eosinophils Absolute 07/19/2024 0.4  0.0 - 0.5 K/uL Final   Basophils Relative 07/19/2024 1  % Final   Basophils Absolute 07/19/2024 0.1  0.0 - 0.1 K/uL Final   Immature Granulocytes 07/19/2024 1  % Final   Abs Immature Granulocytes 07/19/2024 0.07  0.00 - 0.07 K/uL Final   Performed at [facility], [address],  [place], [place] [address]  Admission on 07/08/2024, Discharged on 07/10/2024  Component Date Value Ref Range Status   WBC 07/08/2024 10.6 (H)  4.0 - 10.5 K/uL Final   RBC 07/08/2024 5.65 (H)  3.87 - 5.11 MIL/uL Final   Hemoglobin 07/08/2024 16.5 (H)  12.0 - 15.0 g/dL Final   HCT 88/88/7974 49.4 (H)  36.0 - 46.0 % Final   MCV 07/08/2024 87.4  80.0 - 100.0 fL Final   MCH 07/08/2024 29.2  26.0 - 34.0 pg Final   MCHC 07/08/2024 33.4  30.0 - 36.0 g/dL Final   RDW 88/88/7974 13.7  11.5 - 15.5 % Final   Platelets 07/08/2024 422 (H)  150 - 400 K/uL Final   nRBC 07/08/2024 0.0  0.0 - 0.2 % Final   Neutrophils Relative % 07/08/2024 75  % Final   Neutro Abs 07/08/2024 7.9 (H)  1.7 - 7.7  K/uL Final   Lymphocytes Relative 07/08/2024 17  % Final   Lymphs Abs 07/08/2024 1.8  0.7 - 4.0 K/uL Final   Monocytes Relative 07/08/2024 7  % Final   Monocytes Absolute 07/08/2024 0.7  0.1 - 1.0 K/uL Final   Eosinophils Relative 07/08/2024 0  % Final   Eosinophils Absolute 07/08/2024 0.0  0.0 - 0.5 K/uL Final   Basophils Relative 07/08/2024 1  % Final   Basophils Absolute 07/08/2024 0.1  0.0 - 0.1 K/uL Final   Immature Granulocytes 07/08/2024 0  % Final   Abs Immature Granulocytes 07/08/2024 0.04  0.00 - 0.07 K/uL Final   Performed at [facility], [address], [place], [place] [address]   Sodium 07/08/2024 140  135 - 145 mmol/L Final   Potassium 07/08/2024 4.1  3.5 - 5.1 mmol/L Final   Chloride 07/08/2024 100  98 - 111 mmol/L Final   CO2 07/08/2024 27  22 - 32 mmol/L Final   Glucose, Bld 07/08/2024 113 (H)  70 - 99 mg/dL Final   Glucose reference range applies only to samples taken after fasting for at least 8 hours.   BUN 07/08/2024 10  6 - 20 mg/dL Final   Creatinine, Ser 07/08/2024 0.74  0.44 - 1.00 mg/dL Final   Calcium 88/88/7974 9.9  8.9 - 10.3 mg/dL Final   Total Protein 88/88/7974 7.9  6.5 - 8.1 g/dL Final   Albumin 88/88/7974 3.5  3.5 - 5.0 g/dL Final   AST 88/88/7974 109 (H)  15 - 41 U/L  Final   ALT 07/08/2024 132 (H)  0 - 44 U/L Final   Alkaline Phosphatase 07/08/2024 74  38 - 126 U/L Final   Total Bilirubin 07/08/2024 0.3  0.0 - 1.2 mg/dL Final   GFR, Estimated 07/08/2024 >60  >60 mL/min Final   Comment: (NOTE) Calculated using the CKD-EPI Creatinine Equation (2021)    Anion gap 07/08/2024 13  5 - 15 Final   Performed at [facility], [address] [place], [place] [address]   Hgb A1c MFr Bld 07/08/2024 5.5  4.8 - 5.6 % Final   Comment: (NOTE) Diagnosis of Diabetes The following HbA1c ranges recommended by the American Diabetes Association (ADA) may be used as an aid in the diagnosis of diabetes mellitus.  Hemoglobin             Suggested A1C NGSP%              Diagnosis  <5.7                   Non Diabetic  5.7-6.4                Pre-Diabetic  >6.4                   Diabetic  <7.0                   Glycemic control for                       adults with diabetes.     Mean Plasma Glucose 07/08/2024 111.15  mg/dL Final   Performed at [facility], [address], [place], [place] [address]   Magnesium  07/08/2024 2.1  1.7 - 2.4 mg/dL Final   Performed at [facility], [address] [place], [place] [address]   Alcohol, Ethyl (B) 07/08/2024 <15  <15 mg/dL Final  Comment: (NOTE) For medical purposes only. Performed at [facility], [address], [place], [place] [address]    Cholesterol 07/08/2024 174  0 - 200 mg/dL Final   Triglycerides 88/88/7974 102  <150 mg/dL Final   HDL 88/88/7974 42  >40 mg/dL Final   Total CHOL/HDL Ratio 07/08/2024 4.1  RATIO Final   VLDL 07/08/2024 20  0 - 40 mg/dL Final   LDL Cholesterol 07/08/2024 112 (H)  0 - 99 mg/dL Final   Comment:        Total Cholesterol/HDL:CHD Risk Coronary Heart Disease Risk Table                     Men   Women  1/2 Average Risk   3.4   3.3  Average Risk       5.0   4.4  2 X Average Risk   9.6   7.1  3 X Average Risk  23.4   11.0        Use the calculated Patient  Ratio above and the CHD Risk Table to determine the patient's CHD Risk.        ATP III CLASSIFICATION (LDL):  <100     mg/dL   Optimal  899-870  mg/dL   Near or Above                    Optimal  130-159  mg/dL   Borderline  839-810  mg/dL   High  >809     mg/dL   Very High Performed at [facility], [address], [place], [place] [address]    Color, Urine 07/09/2024 YELLOW  YELLOW Final   APPearance 07/09/2024 TURBID (A)  CLEAR Final   Specific Gravity, Urine 07/09/2024 1.017  1.005 - 1.030 Final   pH 07/09/2024 7.0  5.0 - 8.0 Final   Glucose, UA 07/09/2024 NEGATIVE  NEGATIVE mg/dL Final   Hgb urine dipstick 07/09/2024 NEGATIVE  NEGATIVE Final   Bilirubin Urine 07/09/2024 NEGATIVE  NEGATIVE Final   Ketones, ur 07/09/2024 NEGATIVE  NEGATIVE mg/dL Final   Protein, ur 88/87/7974 NEGATIVE  NEGATIVE mg/dL Final   Nitrite 88/87/7974 NEGATIVE  NEGATIVE Final   Leukocytes,Ua 07/09/2024 MODERATE (A)  NEGATIVE Final   RBC / HPF 07/09/2024 0-5  0 - 5 RBC/hpf Final   WBC, UA 07/09/2024 21-50  0 - 5 WBC/hpf Final   Bacteria, UA 07/09/2024 MANY (A)  NONE SEEN Final   Squamous Epithelial / HPF 07/09/2024 0-5  0 - 5 /HPF Final   WBC Clumps 07/09/2024 PRESENT   Final   Performed at [facility], [address], [place], [place] [address]   Preg Test, Ur 07/09/2024 Negative  Negative Final   POC Amphetamine  UR 07/09/2024 None Detected  NONE DETECTED (Cut Off Level 1000 ng/mL) Final   POC Secobarbital (BAR) 07/09/2024 None Detected  NONE DETECTED (Cut Off Level 300 ng/mL) Final   POC Buprenorphine  (BUP) 07/09/2024 None Detected  NONE DETECTED (Cut Off Level 10 ng/mL) Final   POC Oxazepam (BZO) 07/09/2024 None Detected  NONE DETECTED (Cut Off Level 300 ng/mL) Final   POC Cocaine UR 07/09/2024 Positive (A)  NONE DETECTED (Cut Off Level 300 ng/mL) Final   POC Methamphetamine UR 07/09/2024 None Detected  NONE DETECTED (Cut Off Level 1000 ng/mL) Final   POC Morphine  07/09/2024 Positive (A)   NONE DETECTED (Cut Off Level 300 ng/mL) Final   POC Methadone UR 07/09/2024 None Detected  NONE DETECTED (Cut Off Level 300 ng/mL) Final   POC Oxycodone  UR 07/09/2024 None Detected  NONE DETECTED (Cut Off Level 100 ng/mL) Final   POC Marijuana UR 07/09/2024 Positive (A)  NONE DETECTED (Cut Off Level 50 ng/mL) Final   TSH 07/08/2024 0.112 (L)  0.350 - 4.500 uIU/mL Final   Comment: Performed by a 3rd Generation assay with a functional sensitivity of <=0.01 uIU/mL. Performed at [facility] Lab, [address], [place], [place] [address]    Preg Test, Ur 07/09/2024 NEGATIVE  NEGATIVE Final   Comment:        THE SENSITIVITY OF THIS METHODOLOGY IS >20 mIU/mL.   Admission on 05/17/2024, Discharged on 05/18/2024  Component Date Value Ref Range Status   WBC 05/17/2024 9.0  4.0 - 10.5 K/uL Final   RBC 05/17/2024 4.35  3.87 - 5.11 MIL/uL Final   Hemoglobin 05/17/2024 12.8  12.0 - 15.0 g/dL Final   HCT 90/79/7974 39.7  36.0 - 46.0 % Final   MCV 05/17/2024 91.3  80.0 - 100.0 fL Final   MCH 05/17/2024 29.4  26.0 - 34.0 pg Final   MCHC 05/17/2024 32.2  30.0 - 36.0 g/dL Final   RDW 90/79/7974 14.1  11.5 - 15.5 % Final   Platelets 05/17/2024 286  150 - 400 K/uL Final   nRBC 05/17/2024 0.0  0.0 - 0.2 % Final   Neutrophils Relative % 05/17/2024 65  % Final   Neutro Abs 05/17/2024 5.8  1.7 - 7.7 K/uL Final   Lymphocytes Relative 05/17/2024 23  % Final   Lymphs Abs 05/17/2024 2.1  0.7 - 4.0 K/uL Final   Monocytes Relative 05/17/2024 8  % Final   Monocytes Absolute 05/17/2024 0.8  0.1 - 1.0 K/uL Final   Eosinophils Relative 05/17/2024 4  % Final   Eosinophils Absolute 05/17/2024 0.3  0.0 - 0.5 K/uL Final   Basophils Relative 05/17/2024 0  % Final   Basophils Absolute 05/17/2024 0.0  0.0 - 0.1 K/uL Final   Immature Granulocytes 05/17/2024 0  % Final   Abs Immature Granulocytes 05/17/2024 0.04  0.00 - 0.07 K/uL Final   Performed at [facility], [address], [place], [place] [address]    Preg, Serum 05/17/2024 NEGATIVE  NEGATIVE Final   Comment:        THE SENSITIVITY OF THIS METHODOLOGY IS >10 mIU/mL. Performed at [facility], [address], [place], [place] [address]    Sodium 05/17/2024 141  135 - 145 mmol/L Final   Potassium 05/17/2024 3.5  3.5 - 5.1 mmol/L Final   Chloride 05/17/2024 104  98 - 111 mmol/L Final   CO2 05/17/2024 26  22 - 32 mmol/L Final   Glucose, Bld 05/17/2024 118 (H)  70 - 99 mg/dL Final   Glucose reference range applies only to samples taken after fasting for at least 8 hours.   BUN 05/17/2024 11  6 - 20 mg/dL Final   Creatinine, Ser 05/17/2024 0.78  0.44 - 1.00 mg/dL Final   Calcium 90/79/7974 8.8 (L)  8.9 - 10.3 mg/dL Final   Total Protein 90/79/7974 7.0  6.5 - 8.1 g/dL Final   Albumin 90/79/7974 3.0 (L)  3.5 - 5.0 g/dL Final   AST 90/79/7974 115 (H)  15 - 41 U/L Final   ALT 05/17/2024 144 (H)  0 - 44 U/L Final   Alkaline Phosphatase 05/17/2024 71  38 - 126 U/L Final   Total Bilirubin 05/17/2024 0.5  0.0 -  1.2 mg/dL Final   GFR, Estimated 05/17/2024 >60  >60 mL/min Final   Comment: (NOTE) Calculated using the CKD-EPI Creatinine Equation (2021)    Anion gap 05/17/2024 11  5 - 15 Final   Performed at [facility], [address], [place], [place] [address]   Sodium 05/17/2024 142  135 - 145 mmol/L Final   Potassium 05/17/2024 3.5  3.5 - 5.1 mmol/L Final   Chloride 05/17/2024 103  98 - 111 mmol/L Final   BUN 05/17/2024 12  6 - 20 mg/dL Final   Creatinine, Ser 05/17/2024 0.70  0.44 - 1.00 mg/dL Final   Glucose, Bld 90/79/7974 120 (H)  70 - 99 mg/dL Final   Glucose reference range applies only to samples taken after fasting for at least 8 hours.   Calcium, Ion 05/17/2024 1.13 (L)  1.15 - 1.40 mmol/L Final   TCO2 05/17/2024 28  22 - 32 mmol/L Final   Hemoglobin 05/17/2024 11.6 (L)  12.0 - 15.0 g/dL Final   HCT 90/79/7974 34.0 (L)  36.0 - 46.0 % Final  Admission on 03/25/2024, Discharged on 03/25/2024  Component Date Value Ref  Range Status   Sodium 03/25/2024 140  135 - 145 mmol/L Final   Potassium 03/25/2024 3.2 (L)  3.5 - 5.1 mmol/L Final   Chloride 03/25/2024 105  98 - 111 mmol/L Final   BUN 03/25/2024 9  6 - 20 mg/dL Final   Creatinine, Ser 03/25/2024 0.70  0.44 - 1.00 mg/dL Final   Glucose, Bld 92/70/7974 132 (H)  70 - 99 mg/dL Final   Glucose reference range applies only to samples taken after fasting for at least 8 hours.   Calcium, Ion 03/25/2024 0.99 (L)  1.15 - 1.40 mmol/L Final   TCO2 03/25/2024 24  22 - 32 mmol/L Final   Hemoglobin 03/25/2024 12.9  12.0 - 15.0 g/dL Final   HCT 92/70/7974 38.0  36.0 - 46.0 % Final   Lactic Acid, Venous 03/25/2024 3.2 (HH)  0.5 - 1.9 mmol/L Final   Comment 03/25/2024 NOTIFIED PHYSICIAN   Final  Admission on 02/10/2024, Discharged on 02/12/2024  Component Date Value Ref Range Status   WBC 02/10/2024 16.8 (H)  4.0 - 10.5 K/uL Final   RBC 02/10/2024 4.46  3.87 - 5.11 MIL/uL Final   Hemoglobin 02/10/2024 12.7  12.0 - 15.0 g/dL Final   HCT 93/84/7974 39.5  36.0 - 46.0 % Final   MCV 02/10/2024 88.6  80.0 - 100.0 fL Final   MCH 02/10/2024 28.5  26.0 - 34.0 pg Final   MCHC 02/10/2024 32.2  30.0 - 36.0 g/dL Final   RDW 93/84/7974 14.8  11.5 - 15.5 % Final   Platelets 02/10/2024 331  150 - 400 K/uL Final   nRBC 02/10/2024 0.0  0.0 - 0.2 % Final   Neutrophils Relative % 02/10/2024 83  % Final   Neutro Abs 02/10/2024 14.0 (H)  1.7 - 7.7 K/uL Final   Lymphocytes Relative 02/10/2024 9  % Final   Lymphs Abs 02/10/2024 1.6  0.7 - 4.0 K/uL Final   Monocytes Relative 02/10/2024 6  % Final   Monocytes Absolute 02/10/2024 1.0  0.1 - 1.0 K/uL Final   Eosinophils Relative 02/10/2024 1  % Final   Eosinophils Absolute 02/10/2024 0.1  0.0 - 0.5 K/uL Final   Basophils Relative 02/10/2024 0  % Final   Basophils Absolute 02/10/2024 0.1  0.0 - 0.1 K/uL Final   Immature Granulocytes 02/10/2024 1  % Final   Abs  Immature Granulocytes 02/10/2024 0.08 (H)  0.00 - 0.07 K/uL Final   Performed  at [facility], [address] [place], [place] [address]   Sodium 02/10/2024 136  135 - 145 mmol/L Final   Potassium 02/10/2024 3.9  3.5 - 5.1 mmol/L Final   Chloride 02/10/2024 101  98 - 111 mmol/L Final   CO2 02/10/2024 25  22 - 32 mmol/L Final   Glucose, Bld 02/10/2024 94  70 - 99 mg/dL Final   Glucose reference range applies only to samples taken after fasting for at least 8 hours.   BUN 02/10/2024 7  6 - 20 mg/dL Final   Creatinine, Ser 02/10/2024 0.66  0.44 - 1.00 mg/dL Final   Calcium 93/84/7974 8.7 (L)  8.9 - 10.3 mg/dL Final   Total Protein 93/84/7974 6.8  6.5 - 8.1 g/dL Final   Albumin 93/84/7974 3.2 (L)  3.5 - 5.0 g/dL Final   AST 93/84/7974 215 (H)  15 - 41 U/L Final   ALT 02/10/2024 143 (H)  0 - 44 U/L Final   Alkaline Phosphatase 02/10/2024 82  38 - 126 U/L Final   Total Bilirubin 02/10/2024 1.0  0.0 - 1.2 mg/dL Final   GFR, Estimated 02/10/2024 >60  >60 mL/min Final   Comment: (NOTE) Calculated using the CKD-EPI Creatinine Equation (2021)    Anion gap 02/10/2024 10  5 - 15 Final   Performed at [facility], [address] [place], [place] [address]   Lipase 02/10/2024 25  11 - 51 U/L Final   Performed at [facility], [address], [place], [place] [address]   Specimen Source 02/10/2024 URINE, CLEAN CATCH   Final   Color, Urine 02/10/2024 YELLOW  YELLOW Final   APPearance 02/10/2024 CLOUDY (A)  CLEAR Final   Specific Gravity, Urine 02/10/2024 1.015  1.005 - 1.030 Final   pH 02/10/2024 7.0  5.0 - 8.0 Final   Glucose, UA 02/10/2024 NEGATIVE  NEGATIVE mg/dL Final   Hgb urine dipstick 02/10/2024 NEGATIVE  NEGATIVE Final   Bilirubin Urine 02/10/2024 NEGATIVE  NEGATIVE Final   Ketones, ur 02/10/2024 NEGATIVE  NEGATIVE mg/dL Final   Protein, ur 93/84/7974 30 (A)  NEGATIVE mg/dL Final   Nitrite 93/84/7974 POSITIVE (A)  NEGATIVE Final   Leukocytes,Ua 02/10/2024 MODERATE (A)  NEGATIVE Final   RBC / HPF 02/10/2024 0-5  0 - 5 RBC/hpf Final   WBC, UA  02/10/2024 21-50  0 - 5 WBC/hpf Final   Comment:        Reflex urine culture not performed if WBC <=10, OR if Squamous epithelial cells >5. If Squamous epithelial cells >5 suggest recollection.    Bacteria, UA 02/10/2024 FEW (A)  NONE SEEN Final   Squamous Epithelial / HPF 02/10/2024 0-5  0 - 5 /HPF Final   Mucus 02/10/2024 PRESENT   Final   Amorphous Crystal 02/10/2024 PRESENT   Final   Performed at [facility], [address], [place], [place] [address]   Preg Test, Ur 02/10/2024 NEGATIVE  NEGATIVE Final   Comment:        THE SENSITIVITY OF THIS METHODOLOGY IS >25 mIU/mL. Performed at [facility], [address] [place], [place] [address]    Specimen Description 02/10/2024 BLOOD RIGHT FOREARM   Final   Special Requests 02/10/2024 BOTTLES DRAWN AEROBIC AND ANAEROBIC Blood Culture adequate volume   Final   Culture 02/10/2024    Final  Value:NO GROWTH 5 DAYS Performed at [facility], [address], [place], [place] [address]    Report Status 02/10/2024 02/15/2024 FINAL   Final   Specimen Description 02/10/2024 BLOOD LEFT ANTECUBITAL   Final   Special Requests 02/10/2024 BOTTLES DRAWN AEROBIC AND ANAEROBIC Blood Culture results may not be optimal due to an inadequate volume of blood received in culture bottles   Final   Culture 02/10/2024    Final                   Value:NO GROWTH 5 DAYS Performed at [facility], [address], [place], [place] [address]    Report Status 02/10/2024 02/15/2024 FINAL   Final   Neisseria Gonorrhea 02/10/2024 Positive (A)   Final   Chlamydia 02/10/2024 Negative   Final   Comment 02/10/2024 Normal Reference Ranger Chlamydia - Negative   Final   Comment 02/10/2024 Normal Reference Range Neisseria Gonorrhea - Negative   Final   Yeast Wet Prep HPF POC 02/10/2024 NONE SEEN  NONE SEEN Final   Trich, Wet Prep 02/10/2024 NONE SEEN  NONE SEEN Final   Clue Cells Wet Prep HPF POC 02/10/2024 NONE SEEN  NONE SEEN  Final   WBC, Wet Prep HPF POC 02/10/2024 >=10 (A)  <10 Final   Sperm 02/10/2024 NONE SEEN   Final   Performed at [facility], [address], [place], [place] [address]   HIV Screen 4th Generation wRfx 02/10/2024 Non Reactive  Non Reactive Final   Performed at [facility], [address] [place], [place] [address]   RPR Ser Ql 02/10/2024 NON REACTIVE  NON REACTIVE Final   Performed at [facility], [address] [place], [place] [address]   Hepatitis B Surface Ag 02/10/2024 NON REACTIVE  NON REACTIVE Final   HCV Ab 02/10/2024 Reactive (A)  NON REACTIVE Final   Comment: (NOTE) The CDC recommends that a Reactive HCV antibody result be followed up  with a HCV Nucleic Acid Amplification test.     Hep A IgM 02/10/2024 NON REACTIVE  NON REACTIVE Final   Hep B C IgM 02/10/2024 NON REACTIVE  NON REACTIVE Final   Performed at [facility], [address] [place], [place] [address]   Specimen Description 02/10/2024 URINE, RANDOM   Final   Special Requests 02/10/2024    Final                   Value:NONE Reflexed from [id number] Performed at [facility], [address], [place], [place] [address]    Culture 02/10/2024 MULTIPLE SPECIES PRESENT, SUGGEST RECOLLECTION (A)   Final   Report Status 02/10/2024 02/11/2024 FINAL   Final   WBC 02/11/2024 17.9 (H)  4.0 - 10.5 K/uL Final   RBC 02/11/2024 4.32  3.87 - 5.11 MIL/uL Final   Hemoglobin 02/11/2024 12.4  12.0 - 15.0 g/dL Final   HCT 93/83/7974 38.1  36.0 - 46.0 % Final   MCV 02/11/2024 88.2  80.0 - 100.0 fL Final   MCH 02/11/2024 28.7  26.0 - 34.0 pg Final   MCHC 02/11/2024 32.5  30.0 - 36.0 g/dL Final   RDW 93/83/7974 14.9  11.5 - 15.5 % Final   Platelets 02/11/2024 328  150 - 400 K/uL Final   nRBC 02/11/2024 0.0  0.0 - 0.2 % Final   Performed at [facility], [address] [place], [place] [address]  Sodium 02/11/2024 134 (L)  135 - 145 mmol/L Final   Potassium 02/11/2024 4.1  3.5 - 5.1 mmol/L Final    Chloride 02/11/2024 103  98 - 111 mmol/L Final   CO2 02/11/2024 23  22 - 32 mmol/L Final   Glucose, Bld 02/11/2024 245 (H)  70 - 99 mg/dL Final   Glucose reference range applies only to samples taken after fasting for at least 8 hours.   BUN 02/11/2024 9  6 - 20 mg/dL Final   Creatinine, Ser 02/11/2024 0.76  0.44 - 1.00 mg/dL Final   Calcium 93/83/7974 7.9 (L)  8.9 - 10.3 mg/dL Final   Total Protein 93/83/7974 5.7 (L)  6.5 - 8.1 g/dL Final   Albumin 93/83/7974 2.3 (L)  3.5 - 5.0 g/dL Final   AST 93/83/7974 109 (H)  15 - 41 U/L Final   ALT 02/11/2024 95 (H)  0 - 44 U/L Final   Alkaline Phosphatase 02/11/2024 72  38 - 126 U/L Final   Total Bilirubin 02/11/2024 0.7  0.0 - 1.2 mg/dL Final   GFR, Estimated 02/11/2024 >60  >60 mL/min Final   Comment: (NOTE) Calculated using the CKD-EPI Creatinine Equation (2021)    Anion gap 02/11/2024 8  5 - 15 Final   Performed at [facility], [address], [place], [place] [address]   HCV RNA Qnt(log copy/mL) 02/11/2024 6.778  log10 IU/mL Corrected   HepC Qn 02/11/2024 6,000,000  IU/mL Final   Test Information 02/11/2024 Comment   Final   Comment: (NOTE) The quantitative range of this assay is 15 IU/mL to 100 million IU/mL.    Hcv Genotype 02/11/2024 Comment   Final   Comment: (NOTE) To be performed on this specimen. Performed At: [facility] [address] [place], [place] [id number] Jennette Shorter MD Ey:[id number]    aPTT 02/11/2024 37 (H)  24 - 36 seconds Final   Comment:        IF BASELINE aPTT IS ELEVATED, SUGGEST PATIENT RISK ASSESSMENT BE USED TO DETERMINE APPROPRIATE ANTICOAGULANT THERAPY. Performed at [facility], [address] [place], [place] [address]    Prothrombin Time 02/11/2024 15.4 (H)  11.4 - 15.2 seconds Final   INR 02/11/2024 1.2  0.8 - 1.2 Final   Comment: (NOTE) INR goal varies based on device and disease states. Performed at [facility], [address], [place], [place] [address]     Fibrinogen  02/11/2024 524 (H)  210 - 475 mg/dL Final   Comment: (NOTE) Fibrinogen  results may be underestimated in patients receiving thrombolytic therapy. Performed at [facility], [address], [place], [place] [address]    TSH 02/11/2024 0.373  0.350 - 4.500 uIU/mL Final   Comment: Performed by a 3rd Generation assay with a functional sensitivity of <=0.01 uIU/mL. Performed at [facility], [address], [place], [place] [address]    WBC 02/12/2024 9.0  4.0 - 10.5 K/uL Final   RBC 02/12/2024 4.32  3.87 - 5.11 MIL/uL Final   Hemoglobin 02/12/2024 12.6  12.0 - 15.0 g/dL Final   HCT 93/82/7974 38.4  36.0 - 46.0 % Final   MCV 02/12/2024 88.9  80.0 - 100.0 fL Final   MCH 02/12/2024 29.2  26.0 - 34.0 pg Final   MCHC 02/12/2024 32.8  30.0 - 36.0 g/dL Final   RDW 93/82/7974 14.9  11.5 - 15.5 % Final   Platelets 02/12/2024 340  150 - 400 K/uL Final   nRBC 02/12/2024  0.0  0.0 - 0.2 % Final   Performed at [facility], [address] [place], [place] [address]   Sodium 02/12/2024 138  135 - 145 mmol/L Final   Potassium 02/12/2024 3.9  3.5 - 5.1 mmol/L Final   Chloride 02/12/2024 106  98 - 111 mmol/L Final   CO2 02/12/2024 22  22 - 32 mmol/L Final   Glucose, Bld 02/12/2024 147 (H)  70 - 99 mg/dL Final   Glucose reference range applies only to samples taken after fasting for at least 8 hours.   BUN 02/12/2024 10  6 - 20 mg/dL Final   Creatinine, Ser 02/12/2024 0.66  0.44 - 1.00 mg/dL Final   Calcium 93/82/7974 8.2 (L)  8.9 - 10.3 mg/dL Final   Total Protein 93/82/7974 5.9 (L)  6.5 - 8.1 g/dL Final   Albumin 93/82/7974 2.2 (L)  3.5 - 5.0 g/dL Final   AST 93/82/7974 133 (H)  15 - 41 U/L Final   ALT 02/12/2024 93 (H)  0 - 44 U/L Final   Alkaline Phosphatase 02/12/2024 63  38 - 126 U/L Final   Total Bilirubin 02/12/2024 0.4  0.0 - 1.2 mg/dL Final   GFR, Estimated 02/12/2024 >60  >60 mL/min Final   Comment: (NOTE) Calculated using the CKD-EPI Creatinine Equation  (2021)    Anion gap 02/12/2024 10  5 - 15 Final   Performed at [facility], [address], [place], [place] [address]   Hgb A1c MFr Bld 02/12/2024 5.4  4.8 - 5.6 % Final   Comment: (NOTE) Diagnosis of Diabetes The following HbA1c ranges recommended by the American Diabetes Association (ADA) may be used as an aid in the diagnosis of diabetes mellitus.  Hemoglobin             Suggested A1C NGSP%              Diagnosis  <5.7                   Non Diabetic  5.7-6.4                Pre-Diabetic  >6.4                   Diabetic  <7.0                   Glycemic control for                       adults with diabetes.     Mean Plasma Glucose 02/12/2024 108.28  mg/dL Final   Performed at [facility], [address] [place], [place] [address]   Hepatitis C Genotype 02/11/2024 1a   Final   Comment: (NOTE) This test was developed and its performance characteristics determined by [facility]. It has not been cleared or approved by the Food and Drug Administration. Performed At: [facility] [address] [place], [place] [id number] Jennette Shorter MD Ey:[id number]     Allergies: Patient has no known allergies.  Medications:  Facility Ordered Medications  Medication   alum & mag hydroxide-simeth (MAALOX/MYLANTA) 200-200-20 MG/5ML suspension 30 mL   magnesium  hydroxide (MILK OF MAGNESIA) suspension 30 mL   dicyclomine  (BENTYL ) tablet 20 mg   hydrOXYzine  (ATARAX ) tablet 25 mg   loperamide  (IMODIUM ) capsule 2-4 mg   methocarbamol  (ROBAXIN ) tablet 500 mg   naproxen  (NAPROSYN ) tablet 500 mg   ondansetron  (ZOFRAN -ODT) disintegrating tablet 4 mg  haloperidol  (HALDOL ) tablet 5 mg   And   diphenhydrAMINE  (BENADRYL ) capsule 50 mg   haloperidol  lactate (HALDOL ) injection 5 mg   And   diphenhydrAMINE  (BENADRYL ) injection 50 mg   And   LORazepam  (ATIVAN ) injection 2 mg   haloperidol  lactate (HALDOL ) injection 10 mg   And   diphenhydrAMINE  (BENADRYL ) injection 50 mg    And   LORazepam  (ATIVAN ) injection 2 mg   traZODone  (DESYREL ) tablet 50 mg   cloNIDine  (CATAPRES ) tablet 0.1 mg   Followed by   NOREEN ON 07/31/2024] cloNIDine  (CATAPRES ) tablet 0.1 mg   Followed by   NOREEN ON 08/03/2024] cloNIDine  (CATAPRES ) tablet 0.1 mg   feeding supplement (ENSURE PLUS HIGH PROTEIN) liquid 237 mL   nicotine  (NICODERM CQ  - dosed in mg/24 hours) patch 21 mg   PTA Medications  Medication Sig   buprenorphine -naloxone  (SUBOXONE ) 8-2 mg SUBL SL tablet Place 1 tablet under the tongue daily at 6 (six) AM.   busPIRone  (BUSPAR ) 10 MG tablet Take 2 tablets (20 mg total) by mouth 2 (two) times daily.   nicotine  (NICODERM CQ  - DOSED IN MG/24 HOURS) 21 mg/24hr patch Place 1 patch (21 mg total) onto the skin daily.   QUEtiapine  (SEROQUEL ) 100 MG tablet Take 1 tablet (100 mg total) by mouth every morning.   QUEtiapine  (SEROQUEL ) 300 MG tablet Take 1 tablet (300 mg total) by mouth at bedtime.   traZODone  (DESYREL ) 100 MG tablet Take 1 tablet (100 mg total) by mouth at bedtime as needed for sleep.   polyethylene glycol (MIRALAX  / GLYCOLAX ) 17 g packet Take 17 g by mouth daily as needed for mild constipation or moderate constipation.   senna-docusate (SENOKOT-S) 8.6-50 MG tablet Take 1 tablet by mouth 2 (two) times daily.   feeding supplement (ENSURE PLUS HIGH PROTEIN) LIQD Take 237 mLs by mouth 2 (two) times daily between meals.    Long Term Goals: Improvement in symptoms so as ready for discharge  Short Term Goals: Patient will verbalize feelings in meetings with treatment team members., Patient will attend at least of 50% of the groups daily., Pt will complete the PHQ9 on admission, day 3 and discharge., Patient will participate in completing the [place] Suicide Severity Rating Scale, Patient will score a low risk of violence for 24 hours prior to discharge, and Patient will take medications as prescribed daily.  Medical Decision Making  Recommend admission to the [facility] for  substance abuse treatment.   Recommend COWS protocol.  Lab Orders         CBC with Differential/Platelet         Comprehensive metabolic panel         POC urine preg, ED         POCT Urine Drug Screen - (I-Screen)     EKG  -Nicoderm CQ  21 mg Transdermal patch for nicotine  dependence -Ensure Plus 237 ml PO BID for nutritional supplement  Prn Meds -Maalox, Trazodone  -Agitation Protocol Medications  Recommendations  Based on my evaluation the patient does not appear to have an emergency medical condition.  Recommend admission to the [facility] for substance abuse treatment. Recommend COWS protocol   Thurman LULLA Ivans, NP 07/29/24  2:31 AM

## 2024-07-29 ENCOUNTER — Other Ambulatory Visit: Payer: Self-pay

## 2024-07-29 DIAGNOSIS — F1123 Opioid dependence with withdrawal: Secondary | ICD-10-CM | POA: Diagnosis not present

## 2024-07-29 DIAGNOSIS — F1124 Opioid dependence with opioid-induced mood disorder: Secondary | ICD-10-CM | POA: Diagnosis not present

## 2024-07-29 DIAGNOSIS — F1410 Cocaine abuse, uncomplicated: Secondary | ICD-10-CM | POA: Diagnosis not present

## 2024-07-29 DIAGNOSIS — F331 Major depressive disorder, recurrent, moderate: Secondary | ICD-10-CM | POA: Diagnosis not present

## 2024-07-29 LAB — CBC WITH DIFFERENTIAL/PLATELET
Abs Immature Granulocytes: 0.02 K/uL (ref 0.00–0.07)
Basophils Absolute: 0 K/uL (ref 0.0–0.1)
Basophils Relative: 1 %
Eosinophils Absolute: 0.6 K/uL — ABNORMAL HIGH (ref 0.0–0.5)
Eosinophils Relative: 10 %
HCT: 35.4 % — ABNORMAL LOW (ref 36.0–46.0)
Hemoglobin: 11.7 g/dL — ABNORMAL LOW (ref 12.0–15.0)
Immature Granulocytes: 0 %
Lymphocytes Relative: 35 %
Lymphs Abs: 2.3 K/uL (ref 0.7–4.0)
MCH: 29.5 pg (ref 26.0–34.0)
MCHC: 33.1 g/dL (ref 30.0–36.0)
MCV: 89.4 fL (ref 80.0–100.0)
Monocytes Absolute: 0.6 K/uL (ref 0.1–1.0)
Monocytes Relative: 9 %
Neutro Abs: 3 K/uL (ref 1.7–7.7)
Neutrophils Relative %: 45 %
Platelets: 247 K/uL (ref 150–400)
RBC: 3.96 MIL/uL (ref 3.87–5.11)
RDW: 14.1 % (ref 11.5–15.5)
WBC: 6.5 K/uL (ref 4.0–10.5)
nRBC: 0 % (ref 0.0–0.2)

## 2024-07-29 LAB — COMPREHENSIVE METABOLIC PANEL WITH GFR
ALT: 247 U/L — ABNORMAL HIGH (ref 0–44)
AST: 139 U/L — ABNORMAL HIGH (ref 15–41)
Albumin: 2.9 g/dL — ABNORMAL LOW (ref 3.5–5.0)
Alkaline Phosphatase: 90 U/L (ref 38–126)
Anion gap: 6 (ref 5–15)
BUN: 15 mg/dL (ref 6–20)
CO2: 25 mmol/L (ref 22–32)
Calcium: 9.1 mg/dL (ref 8.9–10.3)
Chloride: 109 mmol/L (ref 98–111)
Creatinine, Ser: 0.71 mg/dL (ref 0.44–1.00)
GFR, Estimated: >60 mL/min (ref >60)
Glucose, Bld: 98 mg/dL (ref 70–99)
Potassium: 3.8 mmol/L (ref 3.5–5.1)
Sodium: 140 mmol/L (ref 135–145)
Total Bilirubin: 0.5 mg/dL (ref 0.0–1.2)
Total Protein: 6.2 g/dL — ABNORMAL LOW (ref 6.5–8.1)

## 2024-07-29 LAB — POCT URINE DRUG SCREEN - MANUAL ENTRY (I-SCREEN)
POC Amphetamine UR: POSITIVE — AB
POC Buprenorphine (BUP): POSITIVE — AB
POC Cocaine UR: POSITIVE — AB
POC Marijuana UR: POSITIVE — AB
POC Methadone UR: NOT DETECTED
POC Methamphetamine UR: POSITIVE — AB
POC Morphine: NOT DETECTED
POC Oxazepam (BZO): NOT DETECTED
POC Oxycodone UR: NOT DETECTED
POC Secobarbital (BAR): NOT DETECTED

## 2024-07-29 LAB — URINALYSIS, ROUTINE W REFLEX MICROSCOPIC
Bilirubin Urine: NEGATIVE
Glucose, UA: NEGATIVE mg/dL
Hgb urine dipstick: NEGATIVE
Ketones, ur: NEGATIVE mg/dL
Nitrite: NEGATIVE
Protein, ur: NEGATIVE mg/dL
Specific Gravity, Urine: 1.018 (ref 1.005–1.030)
pH: 7 (ref 5.0–8.0)

## 2024-07-29 LAB — POC URINE PREG, ED: Preg Test, Ur: NEGATIVE

## 2024-07-29 NOTE — ED Notes (Signed)
 Pt denies SI/HI/AVH. No reports of pain. Compliant with scheduled meds; prn trazodone  given. Pt spent shift in her room and watching TV in the dayroom with peers, appropriate interactions. She was anxious and irritable with staff. No acute distress noted. Pt is currently sleeping in bed. Respirations even and unlabored. Continue to monitor for safety.

## 2024-07-29 NOTE — Group Note (Signed)
 Group Topic: Recovery Basics  Group Date: 07/29/2024 Start Time: 1600 End Time: 1630 Facilitators: Byrd, Shawntia Mangal, RN  Department: [facility]  Number of Participants: 6  Group Focus: check in, communication, and nursing group Treatment Modality:  Psychoeducation Interventions utilized were clarification and patient education Purpose: express feelings, improve communication skills, and increase insight  Name: Morgan Donaldson Date of Birth: 20-Nov-1980  MR: [id number]    Level of Participation: Did not attend Quality of Participation:  Interactions with others:  Mood/Affect:  Triggers (if applicable):  Cognition:  Progress:  Response:  Plan:   Patients Problems:  Patient Active Problem List   Diagnosis Date Noted   Hyperthyroidism 07/16/2024   Opioid dependence with uncomplicated intoxication (HCC) 07/14/2024   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   Elevated liver function tests 07/14/2024   Opioid use with withdrawal (HCC) 07/14/2024   Polysubstance abuse (HCC) 07/10/2024   Pyosalpingitis 02/12/2024   Hepatitis C antibody detected 02/11/2024   PID (acute pelvic inflammatory disease) 02/10/2024   Gonorrhea 12/10/2023   Group A streptococcal infection 12/09/2023   IV drug abuse (HCC) 12/09/2023   Acute cystitis 12/09/2023   GAD (generalized anxiety disorder) 06/21/2020   Attention deficit hyperactivity disorder (ADHD), 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

## 2024-07-29 NOTE — ED Notes (Signed)
 Pt asked for creamer and sugar MHT explained that we don't have any at the moment and she got upset started screaming saying this place is bullshit.

## 2024-07-29 NOTE — ED Notes (Signed)
 Patient noticeably irritable this morning especially regarding medication stating these wont work for me. Patient compliant with medications either way and denies SI,HI, and A/V/H with no plan or intent. Patient ate breakfast and remains overall cooperative in unit.

## 2024-07-29 NOTE — Care Management (Signed)
 [facility] Care Management:  Writer called [facility] to verify if they have accepted her insurance.  [facility] reports she's in network with her insurance and she needs to call at 11:30am to complete a phone interview.  Writer provided the client with [facility] contact information to complete the phone assessment.  Client responded I don't want to go [facility], I prefer [facility]. Writer informed the Client [facility] can be a second option if [facility] does not have immediate openings.  Writer reminded the client her stay here is only 3-5 days.

## 2024-07-29 NOTE — ED Notes (Signed)
 Pt resting in bed, no acute distress noted. Respirations even and unlabored. Continue to monitor for safety.
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
## 2024-07-29 NOTE — Group Note (Signed)
 Group Topic: Recovery Basics  Group Date: 07/29/2024 Start Time: 1100 End Time: 1150 Facilitators: Gerome Jolly, NT  Department: [facility]  Number of Participants: 3  Group Focus: acceptance Treatment Modality:  Cognitive Behavioral Therapy Interventions utilized were exploration Purpose: explore maladaptive thinking, regain self-worth, and relapse prevention strategies  Name: Morgan Donaldson Date of Birth: July 03, 1981  MR: [id number]    Level of Participation: Did not attend Quality of Participation: na Interactions with others: na Mood/Affect: na Triggers (if applicable): na Cognition: na Progress: na Response: Patient did not attend group Plan: patient will be encouraged to participate and attend groups   Patients Problems:  Patient Active Problem List   Diagnosis Date Noted   Hyperthyroidism 07/16/2024   Opioid dependence with uncomplicated intoxication (HCC) 07/14/2024   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   Elevated liver function tests 07/14/2024   Opioid use with withdrawal (HCC) 07/14/2024   Polysubstance abuse (HCC) 07/10/2024   Pyosalpingitis 02/12/2024   Hepatitis C antibody detected 02/11/2024   PID (acute pelvic inflammatory disease) 02/10/2024   Gonorrhea 12/10/2023   Group A streptococcal infection 12/09/2023   IV drug abuse (HCC) 12/09/2023   Acute cystitis 12/09/2023   GAD (generalized anxiety disorder) 06/21/2020   Attention deficit hyperactivity disorder (ADHD), 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

## 2024-07-29 NOTE — Group Note (Signed)
 Group Topic: Social Support  Group Date: 07/29/2024 Start Time: 2000 End Time: 2030 Facilitators: Joan Plowman B  Department: [facility]  Number of Participants: 1  Group Focus: check in Treatment Modality:  Individual Therapy Interventions utilized were support Purpose: express feelings  Name: Morgan Donaldson Date of Birth: June 12, 1981  MR: [id number]    Level of Participation: PT DID NOT ATTEND GROUP  Patients Problems:  Patient Active Problem List   Diagnosis Date Noted   Hyperthyroidism 07/16/2024   Opioid dependence with uncomplicated intoxication (HCC) 07/14/2024   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   Elevated liver function tests 07/14/2024   Opioid use with withdrawal (HCC) 07/14/2024   Polysubstance abuse (HCC) 07/10/2024   Pyosalpingitis 02/12/2024   Hepatitis C antibody detected 02/11/2024   PID (acute pelvic inflammatory disease) 02/10/2024   Gonorrhea 12/10/2023   Group A streptococcal infection 12/09/2023   IV drug abuse (HCC) 12/09/2023   Acute cystitis 12/09/2023   GAD (generalized anxiety disorder) 06/21/2020   Attention deficit hyperactivity disorder (ADHD), 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

## 2024-07-29 NOTE — ED Provider Notes (Signed)
 Behavioral Health Progress Note  Date and Time: 07/29/2024 9:40 AM Name: Morgan Donaldson MRN:  [id number]  Subjective: During evaluation today, patient is lying on her bed in no acute distress. She is alert and oriented x 4. She is calm and cooperative during her assessment. She reports that she has been doing okay. She reports that she smoked a blunt yesterday, but didn't know that it was laced with fentanyl . So when she went to [facility] to start her rehab program yesterday, they did a UDS on her and she tested positive for fentanyl . Therefore, she was declined acceptance to start the program and decided to come here to detox. She would like to detox and then return to [facility] program for rehab. She reports that she last used weed, cocaine, and unintentionally fentanyl  4 days ago. She denies experiencing any withdrawal symptoms today. She said that she isn't going to take the clonidine  taper because she feels like she doesn't need it. Encouraged patient to take it to help with opioid withdrawal although she can refuse it. She reports her appetite and sleep as being good. Her goal is to stay clean. She expressed that she would like to restart taking her suboxone . She also said that her buspar  has not been effective for her, will not reorder at this time. She said that she last took her suboxone  5 days ago. Informed patient that this provider would need to verify that she was taking her suboxone  as prescribed upon discharge. Her mood is depressed and her affect is congruent with her mood. Objectively, there is no evidence of psychosis, mania, or delusional thinking. She does not appear to be responding to internal stimuli. Patient is able to converse coherently, goal-directed thoughts with no distractibility or pre-occupation. She denies suicidal and homicidal ideations. She denies auditory and visual hallucinations.   Asked pharmacist, Almarie Pines to verify her suboxone . She contacted [facility] who  reported that she didn't go there after her discharge from [facility] previously. Tobias, pharmacy technician reports that the patient took her dose of suboxone  a couple of days ago.   Her UDS from 07/29/2024 is positive for amphetamines, buprenorphine , cocaine, methamphetamines, and marijuana. Hepatitis C antibody was detected on 02/11/2024. No documented history of hepatitis C treatment upon chart review. Her CMP reveals that her liver enzymes (AST and ALT) have been trending upwards. Her AST was 139 and ALT was 247 from 07/29/2024 which has previously increased from her AST of 115 and ALT of 184 on 07/19/2024. She was prescribed Seroquel  300 mg by mouth at bedtime and Seroquel  100 mg by mouth every morning during her last stay on [facility] from November 13th-25th, 2025. Case reviewed with Dr. Cole. Current plan to not resume previously prescribed Seroquel  dosages to rule out if Seroquel  has been causing transient LFT elevations. HCV ab w Reflex to [facility] to determine if she has active hepatitis C. Will consider referral to infectious disease for evaluation and treatment if she has active hepatitis C. Order for CMP to completed in 3 days to monitor trend of liver enzymes without seroquel  and suboxone  being resumed. Not restarting suboxone  at this time due to elevation in liver enzymes at this time. Suboxone  treatment will be reconsidered if her liver enzymes trend downwards and if she continues to desire suboxone  treatment with a plan in place for her to attend a residential treatment facility that also offers continued MAT.   Diagnosis:  Final diagnoses:  Polysubstance abuse (HCC)  Fentanyl  use disorder, severe, dependence (  HCC)  Marijuana user  IV drug user  Homelessness  Cocaine abuse (HCC)    Total Time spent with patient: 15 minutes  Past Psychiatric History: Bipolar disorder, PTSD, and polysubstance use Past Medical History:   Past Medical History:  Diagnosis Date   Bronchitis     Hydrosalpinx    bilateral   Palpitations    Physiological ovarian cysts   -polysubstance abuse -pyosalpingitis -hepatitis C antibody detected (02/11/2024) -acute pelvic inflammatory disease -gonorrhea -group A streptococcal infection -IV drug abuse -acute cystitis -generalized anxiety disorder -ADHD, combined type -breakthrough bleeding on depo provera  -pap smear abnormality of cervix/HPV positive -hemorrhagic cyst of left ovary -tobacco abuse -history of drug abuse in remission  Family History: not on file Family Psychiatric  History: not on file Social History: Has been homeless for the past year. Unemployed. Fentanyl , cocaine, and weed use.     Sleep: Good  Appetite:  Good  Current Medications:  Current Facility-Administered Medications  Medication Dose Route Frequency Provider Last Rate Last Admin   alum & mag hydroxide-simeth (MAALOX/MYLANTA) 200-200-20 MG/5ML suspension 30 mL  30 mL Oral Q4H PRN Onuoha, Chinwendu V, NP       cloNIDine  (CATAPRES ) tablet 0.1 mg  0.1 mg Oral QID Onuoha, Chinwendu V, NP   0.1 mg at 07/28/24 2347   Followed by   NOREEN ON 07/31/2024] cloNIDine  (CATAPRES ) tablet 0.1 mg  0.1 mg Oral BH-qamhs Onuoha, Chinwendu V, NP       Followed by   NOREEN ON 08/03/2024] cloNIDine  (CATAPRES ) tablet 0.1 mg  0.1 mg Oral QAC breakfast Onuoha, Chinwendu V, NP       dicyclomine  (BENTYL ) tablet 20 mg  20 mg Oral Q6H PRN Onuoha, Chinwendu V, NP       haloperidol  (HALDOL ) tablet 5 mg  5 mg Oral TID PRN Onuoha, Chinwendu V, NP       And   diphenhydrAMINE  (BENADRYL ) capsule 50 mg  50 mg Oral TID PRN Onuoha, Chinwendu V, NP       haloperidol  lactate (HALDOL ) injection 5 mg  5 mg Intramuscular TID PRN Onuoha, Chinwendu V, NP       And   diphenhydrAMINE  (BENADRYL ) injection 50 mg  50 mg Intramuscular TID PRN Onuoha, Chinwendu V, NP       And   LORazepam  (ATIVAN ) injection 2 mg  2 mg Intramuscular TID PRN Onuoha, Chinwendu V, NP       haloperidol  lactate (HALDOL )  injection 10 mg  10 mg Intramuscular TID PRN Onuoha, Chinwendu V, NP       And   diphenhydrAMINE  (BENADRYL ) injection 50 mg  50 mg Intramuscular TID PRN Onuoha, Chinwendu V, NP       And   LORazepam  (ATIVAN ) injection 2 mg  2 mg Intramuscular TID PRN Onuoha, Chinwendu V, NP       feeding supplement (ENSURE PLUS HIGH PROTEIN) liquid 237 mL  237 mL Oral BID BM Onuoha, Chinwendu V, NP       hydrOXYzine  (ATARAX ) tablet 25 mg  25 mg Oral Q6H PRN Onuoha, Chinwendu V, NP       loperamide  (IMODIUM ) capsule 2-4 mg  2-4 mg Oral PRN Onuoha, Chinwendu V, NP       magnesium  hydroxide (MILK OF MAGNESIA) suspension 30 mL  30 mL Oral Daily PRN Onuoha, Chinwendu V, NP       methocarbamol  (ROBAXIN ) tablet 500 mg  500 mg Oral Q8H PRN Onuoha, Chinwendu V, NP   500 mg at 07/28/24 2347  naproxen  (NAPROSYN ) tablet 500 mg  500 mg Oral BID PRN Onuoha, Chinwendu V, NP       nicotine  (NICODERM CQ  - dosed in mg/24 hours) patch 21 mg  21 mg Transdermal Daily Onuoha, Chinwendu V, NP       ondansetron  (ZOFRAN -ODT) disintegrating tablet 4 mg  4 mg Oral Q6H PRN Onuoha, Chinwendu V, NP       traZODone  (DESYREL ) tablet 50 mg  50 mg Oral QHS PRN Onuoha, Chinwendu V, NP   50 mg at 07/28/24 2347   Current Outpatient Medications  Medication Sig Dispense Refill   buprenorphine -naloxone  (SUBOXONE ) 8-2 mg SUBL SL tablet Place 1 tablet under the tongue daily at 6 (six) AM. 30 tablet 0   busPIRone  (BUSPAR ) 10 MG tablet Take 2 tablets (20 mg total) by mouth 2 (two) times daily. 30 tablet 0   feeding supplement (ENSURE PLUS HIGH PROTEIN) LIQD Take 237 mLs by mouth 2 (two) times daily between meals. 14220 mL 0   nicotine  (NICODERM CQ  - DOSED IN MG/24 HOURS) 21 mg/24hr patch Place 1 patch (21 mg total) onto the skin daily. 28 patch 0   polyethylene glycol (MIRALAX  / GLYCOLAX ) 17 g packet Take 17 g by mouth daily as needed for mild constipation or moderate constipation. 24 each 0   QUEtiapine  (SEROQUEL ) 100 MG tablet Take 1 tablet (100 mg  total) by mouth every morning. 30 tablet 0   QUEtiapine  (SEROQUEL ) 300 MG tablet Take 1 tablet (300 mg total) by mouth at bedtime. 30 tablet 0   senna-docusate (SENOKOT-S) 8.6-50 MG tablet Take 1 tablet by mouth 2 (two) times daily. 60 tablet 0   traZODone  (DESYREL ) 100 MG tablet Take 1 tablet (100 mg total) by mouth at bedtime as needed for sleep. 30 tablet 0    Labs  Lab Results:  Admission on 07/28/2024  Component Date Value Ref Range Status   WBC 07/29/2024 6.5  4.0 - 10.5 K/uL Final   RBC 07/29/2024 3.96  3.87 - 5.11 MIL/uL Final   Hemoglobin 07/29/2024 11.7 (L)  12.0 - 15.0 g/dL Final   HCT 87/97/7974 35.4 (L)  36.0 - 46.0 % Final   MCV 07/29/2024 89.4  80.0 - 100.0 fL Final   MCH 07/29/2024 29.5  26.0 - 34.0 pg Final   MCHC 07/29/2024 33.1  30.0 - 36.0 g/dL Final   RDW 87/97/7974 14.1  11.5 - 15.5 % Final   Platelets 07/29/2024 247  150 - 400 K/uL Final   nRBC 07/29/2024 0.0  0.0 - 0.2 % Final   Neutrophils Relative % 07/29/2024 45  % Final   Neutro Abs 07/29/2024 3.0  1.7 - 7.7 K/uL Final   Lymphocytes Relative 07/29/2024 35  % Final   Lymphs Abs 07/29/2024 2.3  0.7 - 4.0 K/uL Final   Monocytes Relative 07/29/2024 9  % Final   Monocytes Absolute 07/29/2024 0.6  0.1 - 1.0 K/uL Final   Eosinophils Relative 07/29/2024 10  % Final   Eosinophils Absolute 07/29/2024 0.6 (H)  0.0 - 0.5 K/uL Final   Basophils Relative 07/29/2024 1  % Final   Basophils Absolute 07/29/2024 0.0  0.0 - 0.1 K/uL Final   Immature Granulocytes 07/29/2024 0  % Final   Abs Immature Granulocytes 07/29/2024 0.02  0.00 - 0.07 K/uL Final   Performed at [facility], [address], [place], [place] [address]   Sodium 07/29/2024 140  135 - 145 mmol/L Final   Potassium 07/29/2024 3.8  3.5 - 5.1  mmol/L Final   Chloride 07/29/2024 109  98 - 111 mmol/L Final   CO2 07/29/2024 25  22 - 32 mmol/L Final   Glucose, Bld 07/29/2024 98  70 - 99 mg/dL Final   Glucose reference range applies only to samples taken  after fasting for at least 8 hours.   BUN 07/29/2024 15  6 - 20 mg/dL Final   Creatinine, Ser 07/29/2024 0.71  0.44 - 1.00 mg/dL Final   Calcium 87/97/7974 9.1  8.9 - 10.3 mg/dL Final   Total Protein 87/97/7974 6.2 (L)  6.5 - 8.1 g/dL Final   Albumin 87/97/7974 2.9 (L)  3.5 - 5.0 g/dL Final   AST 87/97/7974 139 (H)  15 - 41 U/L Final   ALT 07/29/2024 247 (H)  0 - 44 U/L Final   Alkaline Phosphatase 07/29/2024 90  38 - 126 U/L Final   Total Bilirubin 07/29/2024 0.5  0.0 - 1.2 mg/dL Final   GFR, Estimated 07/29/2024 >60  >60 mL/min Final   Comment: (NOTE) Calculated using the CKD-EPI Creatinine Equation (2021)    Anion gap 07/29/2024 6  5 - 15 Final   Performed at [facility], [address], [place], [place] [address]   Preg Test, Ur 07/29/2024 Negative  Negative Final   POC Amphetamine  UR 07/29/2024 Positive (A)  NONE DETECTED (Cut Off Level 1000 ng/mL) Final   POC Secobarbital (BAR) 07/29/2024 None Detected  NONE DETECTED (Cut Off Level 300 ng/mL) Final   POC Buprenorphine  (BUP) 07/29/2024 Positive (A)  NONE DETECTED (Cut Off Level 10 ng/mL) Final   POC Oxazepam (BZO) 07/29/2024 None Detected  NONE DETECTED (Cut Off Level 300 ng/mL) Final   POC Cocaine UR 07/29/2024 Positive (A)  NONE DETECTED (Cut Off Level 300 ng/mL) Final   POC Methamphetamine UR 07/29/2024 Positive (A)  NONE DETECTED (Cut Off Level 1000 ng/mL) Final   POC Morphine  07/29/2024 None Detected  NONE DETECTED (Cut Off Level 300 ng/mL) Final   POC Methadone UR 07/29/2024 None Detected  NONE DETECTED (Cut Off Level 300 ng/mL) Final   POC Oxycodone  UR 07/29/2024 None Detected  NONE DETECTED (Cut Off Level 100 ng/mL) Final   POC Marijuana UR 07/29/2024 Positive (A)  NONE DETECTED (Cut Off Level 50 ng/mL) Final  Admission on 07/10/2024, Discharged on 07/22/2024  Component Date Value Ref Range Status   T3, Free 07/11/2024 2.2  2.0 - 4.4 pg/mL Final   Comment: (NOTE) Performed At: [facility] [address]  [place], [place] [id number] Jennette Shorter MD Ey:[id number]    Free T4 07/11/2024 0.71  0.61 - 1.12 ng/dL Final   Comment: (NOTE) Biotin ingestion may interfere with free T4 tests. If the results are inconsistent with the TSH level, previous test results, or the clinical presentation, then consider biotin interference. If needed, order repeat testing after stopping biotin. Performed at [facility], [address] [place], [place] [address]    Neisseria Gonorrhea 07/12/2024 Negative   Final   Chlamydia 07/12/2024 Negative   Final   Comment 07/12/2024 Normal Reference Ranger Chlamydia - Negative   Final   Comment 07/12/2024 Normal Reference Range Neisseria Gonorrhea - Negative   Final   Total Protein 07/12/2024 7.1  6.5 - 8.1 g/dL Final   Albumin 88/84/7974 3.3 (L)  3.5 - 5.0 g/dL Final   AST 88/84/7974 67 (H)  15 - 41 U/L Final   ALT 07/12/2024 95 (H)  0 - 44 U/L Final   Alkaline Phosphatase 07/12/2024 110  38 - 126 U/L Final   Total Bilirubin 07/12/2024 0.6  0.0 - 1.2 mg/dL Final   Bilirubin, Direct 07/12/2024 <0.1  0.0 - 0.2 mg/dL Final   Indirect Bilirubin 07/12/2024 NOT CALCULATED  0.3 - 0.9 mg/dL Final   Performed at [facility], [address] [place], [place] [address]   RPR Ser Ql 07/12/2024 NON REACTIVE  NON REACTIVE Final   Performed at [facility], [address], [place], [place] [address]   [facility] test code 07/12/2024 [id number]   Final   [facility] test name 07/12/2024 [id number]   Final   Source ([facility]) 07/12/2024 SERUM   Final   Performed at [facility], [address], [place], [place] [address]   Misc [facility] result 07/12/2024 COMMENT   Final   Comment: (NOTE) Test Ordered: [id number] HIV Ab/p24 Ag with Reflex HIV Ab/p24 Ag Screen           Note:                     BN     Non Reactive                                                  Reference Range: Non Reactive                          HIV-1/HIV-2 antibodies and HIV-1 p24 antigen were NOT  detected. There is no laboratory evidence of HIV infection. HIV Negative Performed At: [facility] [address] [place], [place] [id number] Jennette Shorter MD Ey:[id number]    Color, Urine 07/19/2024 YELLOW  YELLOW Final   APPearance 07/19/2024 CLOUDY (A)  CLEAR Final   Specific Gravity, Urine 07/19/2024 1.018  1.005 - 1.030 Final   pH 07/19/2024 6.0  5.0 - 8.0 Final   Glucose, UA 07/19/2024 NEGATIVE  NEGATIVE mg/dL Final   Hgb urine dipstick 07/19/2024 NEGATIVE  NEGATIVE Final   Bilirubin Urine 07/19/2024 NEGATIVE  NEGATIVE Final   Ketones, ur 07/19/2024 NEGATIVE  NEGATIVE mg/dL Final   Protein, ur 88/77/7974 NEGATIVE  NEGATIVE mg/dL Final   Nitrite 88/77/7974 NEGATIVE  NEGATIVE Final   Leukocytes,Ua 07/19/2024 LARGE (A)  NEGATIVE Final   RBC / HPF 07/19/2024 0-5  0 - 5 RBC/hpf Final   WBC, UA 07/19/2024 11-20  0 - 5 WBC/hpf Final   Bacteria, UA 07/19/2024 RARE (A)  NONE SEEN Final   Squamous Epithelial / HPF 07/19/2024 0-5  0 - 5 /HPF Final   Mucus 07/19/2024 PRESENT   Final   Performed at [facility], [address], [place], [place] [address]   Sodium 07/19/2024 139  135 - 145 mmol/L Final   Potassium 07/19/2024 4.0  3.5 - 5.1 mmol/L Final   Chloride 07/19/2024 103  98 - 111 mmol/L Final   CO2 07/19/2024 25  22 - 32 mmol/L Final   Glucose, Bld 07/19/2024 99  70 - 99 mg/dL Final   Glucose reference range applies only to samples taken after fasting for at least 8 hours.   BUN 07/19/2024 22 (H)  6 - 20 mg/dL Final   Creatinine, Ser 07/19/2024 0.78  0.44 - 1.00 mg/dL Final   Calcium 88/77/7974 8.8 (L)  8.9 - 10.3 mg/dL Final   Total Protein 88/77/7974 7.0  6.5 - 8.1 g/dL Final   Albumin 88/77/7974 3.3 (L)  3.5 - 5.0 g/dL Final   AST 88/77/7974 115 (H)  15 - 41 U/L Final   ALT 07/19/2024 184 (H)  0 - 44 U/L Final   Alkaline Phosphatase 07/19/2024 72  38 - 126 U/L Final   Total Bilirubin 07/19/2024 0.4  0.0 - 1.2 mg/dL Final   GFR, Estimated 07/19/2024 >60  >60  mL/min Final   Comment: (NOTE) Calculated using the CKD-EPI Creatinine Equation (2021)    Anion gap 07/19/2024 11  5 - 15 Final   Performed at [facility], [address], [place], [place] [address]   WBC 07/19/2024 10.6 (H)  4.0 - 10.5 K/uL Final   RBC 07/19/2024 4.38  3.87 - 5.11 MIL/uL Final   Hemoglobin 07/19/2024 12.7  12.0 - 15.0 g/dL Final   HCT 88/77/7974 39.1  36.0 - 46.0 % Final   MCV 07/19/2024 89.3  80.0 - 100.0 fL Final   MCH 07/19/2024 29.0  26.0 - 34.0 pg Final   MCHC 07/19/2024 32.5  30.0 - 36.0 g/dL Final   RDW 88/77/7974 14.0  11.5 - 15.5 % Final   Platelets 07/19/2024 305  150 - 400 K/uL Final   nRBC 07/19/2024 0.0  0.0 - 0.2 % Final   Neutrophils Relative % 07/19/2024 64  % Final   Neutro Abs 07/19/2024 6.8  1.7 - 7.7 K/uL Final   Lymphocytes Relative 07/19/2024 23  % Final   Lymphs Abs 07/19/2024 2.4  0.7 - 4.0 K/uL Final   Monocytes Relative 07/19/2024 7  % Final   Monocytes Absolute 07/19/2024 0.8  0.1 - 1.0 K/uL Final   Eosinophils Relative 07/19/2024 4  % Final   Eosinophils Absolute 07/19/2024 0.4  0.0 - 0.5 K/uL Final   Basophils Relative 07/19/2024 1  % Final   Basophils Absolute 07/19/2024 0.1  0.0 - 0.1 K/uL Final   Immature Granulocytes 07/19/2024 1  % Final   Abs Immature Granulocytes 07/19/2024 0.07  0.00 - 0.07 K/uL Final   Performed at [facility], [address] [place], [place] [address]  Admission on 07/08/2024, Discharged on 07/10/2024  Component Date Value Ref Range Status   WBC 07/08/2024 10.6 (H)  4.0 - 10.5 K/uL Final   RBC 07/08/2024 5.65 (H)  3.87 - 5.11 MIL/uL Final   Hemoglobin 07/08/2024 16.5 (H)  12.0 - 15.0 g/dL Final   HCT 88/88/7974 49.4 (H)  36.0 - 46.0 % Final   MCV 07/08/2024 87.4  80.0 - 100.0 fL Final   MCH 07/08/2024 29.2  26.0 - 34.0 pg Final   MCHC 07/08/2024 33.4  30.0 - 36.0 g/dL Final   RDW 88/88/7974 13.7  11.5 - 15.5 % Final   Platelets 07/08/2024 422 (H)  150 - 400 K/uL Final   nRBC 07/08/2024 0.0   0.0 - 0.2 % Final   Neutrophils Relative % 07/08/2024 75  % Final   Neutro Abs 07/08/2024 7.9 (H)  1.7 - 7.7 K/uL Final   Lymphocytes Relative 07/08/2024 17  % Final   Lymphs Abs 07/08/2024 1.8  0.7 - 4.0 K/uL Final   Monocytes Relative 07/08/2024 7  % Final   Monocytes Absolute 07/08/2024 0.7  0.1 - 1.0 K/uL Final   Eosinophils Relative 07/08/2024 0  % Final   Eosinophils Absolute 07/08/2024 0.0  0.0 - 0.5 K/uL Final   Basophils Relative 07/08/2024 1  % Final   Basophils Absolute 07/08/2024 0.1  0.0 - 0.1 K/uL Final   Immature Granulocytes 07/08/2024 0  % Final   Abs Immature Granulocytes 07/08/2024 0.04  0.00 - 0.07 K/uL Final   Performed at [facility], [address], [place], [place] [address]   Sodium 07/08/2024 140  135 - 145 mmol/L Final   Potassium 07/08/2024 4.1  3.5 - 5.1 mmol/L Final   Chloride 07/08/2024 100  98 - 111 mmol/L Final   CO2 07/08/2024 27  22 - 32 mmol/L Final   Glucose, Bld 07/08/2024 113 (H)  70 - 99 mg/dL Final   Glucose reference range applies only to samples taken after fasting for at least 8 hours.   BUN 07/08/2024 10  6 - 20 mg/dL Final   Creatinine, Ser 07/08/2024 0.74  0.44 - 1.00 mg/dL Final   Calcium 88/88/7974 9.9  8.9 - 10.3 mg/dL Final   Total Protein 88/88/7974 7.9  6.5 - 8.1 g/dL Final   Albumin 88/88/7974 3.5  3.5 - 5.0 g/dL Final   AST 88/88/7974 109 (H)  15 - 41 U/L Final   ALT 07/08/2024 132 (H)  0 - 44 U/L Final   Alkaline Phosphatase 07/08/2024 74  38 - 126 U/L Final   Total Bilirubin 07/08/2024 0.3  0.0 - 1.2 mg/dL Final   GFR, Estimated 07/08/2024 >60  >60 mL/min Final   Comment: (NOTE) Calculated using the CKD-EPI Creatinine Equation (2021)    Anion gap 07/08/2024 13  5 - 15 Final   Performed at [facility], [address] [place], [place] [address]   Hgb A1c MFr Bld 07/08/2024 5.5  4.8 - 5.6 % Final   Comment: (NOTE) Diagnosis of Diabetes The following HbA1c ranges recommended by the American Diabetes  Association (ADA) may be used as an aid in the diagnosis of diabetes mellitus.  Hemoglobin             Suggested A1C NGSP%              Diagnosis  <5.7                   Non Diabetic  5.7-6.4                Pre-Diabetic  >6.4                   Diabetic  <7.0                   Glycemic control for                       adults with diabetes.     Mean Plasma Glucose 07/08/2024 111.15  mg/dL Final   Performed at [facility], [address] [place], [place] [address]   Magnesium  07/08/2024 2.1  1.7 - 2.4 mg/dL Final   Performed at [facility], [address], [place], [place] [address]   Alcohol, Ethyl (B) 07/08/2024 <15  <15 mg/dL Final   Comment: (NOTE) For medical purposes only. Performed at [facility], [address] [place], [place] [address]    Cholesterol 07/08/2024 174  0 - 200 mg/dL Final   Triglycerides 88/88/7974 102  <150 mg/dL Final   HDL 88/88/7974 42  >40 mg/dL Final   Total CHOL/HDL Ratio 07/08/2024 4.1  RATIO Final   VLDL 07/08/2024 20  0 - 40 mg/dL Final   LDL Cholesterol 07/08/2024 112 (H)  0 -  99 mg/dL Final   Comment:        Total Cholesterol/HDL:CHD Risk Coronary Heart Disease Risk Table                     Men   Women  1/2 Average Risk   3.4   3.3  Average Risk       5.0   4.4  2 X Average Risk   9.6   7.1  3 X Average Risk  23.4   11.0        Use the calculated Patient Ratio above and the CHD Risk Table to determine the patient's CHD Risk.        ATP III CLASSIFICATION (LDL):  <100     mg/dL   Optimal  899-870  mg/dL   Near or Above                    Optimal  130-159  mg/dL   Borderline  839-810  mg/dL   High  >809     mg/dL   Very High Performed at [facility], [address] [place], [place] [address]    Color, Urine 07/09/2024 YELLOW  YELLOW Final   APPearance 07/09/2024 TURBID (A)  CLEAR Final   Specific Gravity, Urine 07/09/2024 1.017  1.005 - 1.030 Final   pH 07/09/2024 7.0  5.0 - 8.0 Final   Glucose,  UA 07/09/2024 NEGATIVE  NEGATIVE mg/dL Final   Hgb urine dipstick 07/09/2024 NEGATIVE  NEGATIVE Final   Bilirubin Urine 07/09/2024 NEGATIVE  NEGATIVE Final   Ketones, ur 07/09/2024 NEGATIVE  NEGATIVE mg/dL Final   Protein, ur 88/87/7974 NEGATIVE  NEGATIVE mg/dL Final   Nitrite 88/87/7974 NEGATIVE  NEGATIVE Final   Leukocytes,Ua 07/09/2024 MODERATE (A)  NEGATIVE Final   RBC / HPF 07/09/2024 0-5  0 - 5 RBC/hpf Final   WBC, UA 07/09/2024 21-50  0 - 5 WBC/hpf Final   Bacteria, UA 07/09/2024 MANY (A)  NONE SEEN Final   Squamous Epithelial / HPF 07/09/2024 0-5  0 - 5 /HPF Final   WBC Clumps 07/09/2024 PRESENT   Final   Performed at [facility], [address], [place], [place] [address]   Preg Test, Ur 07/09/2024 Negative  Negative Final   POC Amphetamine  UR 07/09/2024 None Detected  NONE DETECTED (Cut Off Level 1000 ng/mL) Final   POC Secobarbital (BAR) 07/09/2024 None Detected  NONE DETECTED (Cut Off Level 300 ng/mL) Final   POC Buprenorphine  (BUP) 07/09/2024 None Detected  NONE DETECTED (Cut Off Level 10 ng/mL) Final   POC Oxazepam (BZO) 07/09/2024 None Detected  NONE DETECTED (Cut Off Level 300 ng/mL) Final   POC Cocaine UR 07/09/2024 Positive (A)  NONE DETECTED (Cut Off Level 300 ng/mL) Final   POC Methamphetamine UR 07/09/2024 None Detected  NONE DETECTED (Cut Off Level 1000 ng/mL) Final   POC Morphine  07/09/2024 Positive (A)  NONE DETECTED (Cut Off Level 300 ng/mL) Final   POC Methadone UR 07/09/2024 None Detected  NONE DETECTED (Cut Off Level 300 ng/mL) Final   POC Oxycodone  UR 07/09/2024 None Detected  NONE DETECTED (Cut Off Level 100 ng/mL) Final   POC Marijuana UR 07/09/2024 Positive (A)  NONE DETECTED (Cut Off Level 50 ng/mL) Final   TSH 07/08/2024 0.112 (L)  0.350 - 4.500 uIU/mL Final   Comment: Performed by a 3rd Generation assay with a functional sensitivity of <=0.01 uIU/mL. Performed at [facility], [address]  [place], [place] [address]    Preg Test, Ur  07/09/2024 NEGATIVE  NEGATIVE Final   Comment:        THE SENSITIVITY OF THIS METHODOLOGY IS >20 mIU/mL.   Admission on 05/17/2024, Discharged on 05/18/2024  Component Date Value Ref Range Status   WBC 05/17/2024 9.0  4.0 - 10.5 K/uL Final   RBC 05/17/2024 4.35  3.87 - 5.11 MIL/uL Final   Hemoglobin 05/17/2024 12.8  12.0 - 15.0 g/dL Final   HCT 90/79/7974 39.7  36.0 - 46.0 % Final   MCV 05/17/2024 91.3  80.0 - 100.0 fL Final   MCH 05/17/2024 29.4  26.0 - 34.0 pg Final   MCHC 05/17/2024 32.2  30.0 - 36.0 g/dL Final   RDW 90/79/7974 14.1  11.5 - 15.5 % Final   Platelets 05/17/2024 286  150 - 400 K/uL Final   nRBC 05/17/2024 0.0  0.0 - 0.2 % Final   Neutrophils Relative % 05/17/2024 65  % Final   Neutro Abs 05/17/2024 5.8  1.7 - 7.7 K/uL Final   Lymphocytes Relative 05/17/2024 23  % Final   Lymphs Abs 05/17/2024 2.1  0.7 - 4.0 K/uL Final   Monocytes Relative 05/17/2024 8  % Final   Monocytes Absolute 05/17/2024 0.8  0.1 - 1.0 K/uL Final   Eosinophils Relative 05/17/2024 4  % Final   Eosinophils Absolute 05/17/2024 0.3  0.0 - 0.5 K/uL Final   Basophils Relative 05/17/2024 0  % Final   Basophils Absolute 05/17/2024 0.0  0.0 - 0.1 K/uL Final   Immature Granulocytes 05/17/2024 0  % Final   Abs Immature Granulocytes 05/17/2024 0.04  0.00 - 0.07 K/uL Final   Performed at [facility], [address] [place], [place] [address]   Preg, Serum 05/17/2024 NEGATIVE  NEGATIVE Final   Comment:        THE SENSITIVITY OF THIS METHODOLOGY IS >10 mIU/mL. Performed at [facility], [address], [place], [place] [address]    Sodium 05/17/2024 141  135 - 145 mmol/L Final   Potassium 05/17/2024 3.5  3.5 - 5.1 mmol/L Final   Chloride 05/17/2024 104  98 - 111 mmol/L Final   CO2 05/17/2024 26  22 - 32 mmol/L Final   Glucose, Bld 05/17/2024 118 (H)  70 - 99 mg/dL Final   Glucose reference range applies only to samples taken after fasting for at least 8 hours.   BUN 05/17/2024 11  6 - 20  mg/dL Final   Creatinine, Ser 05/17/2024 0.78  0.44 - 1.00 mg/dL Final   Calcium 90/79/7974 8.8 (L)  8.9 - 10.3 mg/dL Final   Total Protein 90/79/7974 7.0  6.5 - 8.1 g/dL Final   Albumin 90/79/7974 3.0 (L)  3.5 - 5.0 g/dL Final   AST 90/79/7974 115 (H)  15 - 41 U/L Final   ALT 05/17/2024 144 (H)  0 - 44 U/L Final   Alkaline Phosphatase 05/17/2024 71  38 - 126 U/L Final   Total Bilirubin 05/17/2024 0.5  0.0 - 1.2 mg/dL Final   GFR, Estimated 05/17/2024 >60  >60 mL/min Final   Comment: (NOTE) Calculated using the CKD-EPI Creatinine Equation (2021)    Anion gap 05/17/2024 11  5 - 15 Final   Performed at [facility], [address], [place], [place] [address]   Sodium 05/17/2024 142  135 - 145 mmol/L Final   Potassium 05/17/2024 3.5  3.5 - 5.1 mmol/L Final   Chloride 05/17/2024 103  98 - 111 mmol/L Final   BUN 05/17/2024 12  6 - 20 mg/dL Final   Creatinine, Ser 05/17/2024 0.70  0.44 - 1.00 mg/dL Final   Glucose, Bld 90/79/7974 120 (H)  70 - 99 mg/dL Final   Glucose reference range applies only to samples taken after fasting for at least 8 hours.   Calcium, Ion 05/17/2024 1.13 (L)  1.15 - 1.40 mmol/L Final   TCO2 05/17/2024 28  22 - 32 mmol/L Final   Hemoglobin 05/17/2024 11.6 (L)  12.0 - 15.0 g/dL Final   HCT 90/79/7974 34.0 (L)  36.0 - 46.0 % Final  Admission on 03/25/2024, Discharged on 03/25/2024  Component Date Value Ref Range Status   Sodium 03/25/2024 140  135 - 145 mmol/L Final   Potassium 03/25/2024 3.2 (L)  3.5 - 5.1 mmol/L Final   Chloride 03/25/2024 105  98 - 111 mmol/L Final   BUN 03/25/2024 9  6 - 20 mg/dL Final   Creatinine, Ser 03/25/2024 0.70  0.44 - 1.00 mg/dL Final   Glucose, Bld 92/70/7974 132 (H)  70 - 99 mg/dL Final   Glucose reference range applies only to samples taken after fasting for at least 8 hours.   Calcium, Ion 03/25/2024 0.99 (L)  1.15 - 1.40 mmol/L Final   TCO2 03/25/2024 24  22 - 32 mmol/L Final   Hemoglobin 03/25/2024 12.9  12.0 - 15.0 g/dL  Final   HCT 92/70/7974 38.0  36.0 - 46.0 % Final   Lactic Acid, Venous 03/25/2024 3.2 (HH)  0.5 - 1.9 mmol/L Final   Comment 03/25/2024 NOTIFIED PHYSICIAN   Final  Admission on 02/10/2024, Discharged on 02/12/2024  Component Date Value Ref Range Status   WBC 02/10/2024 16.8 (H)  4.0 - 10.5 K/uL Final   RBC 02/10/2024 4.46  3.87 - 5.11 MIL/uL Final   Hemoglobin 02/10/2024 12.7  12.0 - 15.0 g/dL Final   HCT 93/84/7974 39.5  36.0 - 46.0 % Final   MCV 02/10/2024 88.6  80.0 - 100.0 fL Final   MCH 02/10/2024 28.5  26.0 - 34.0 pg Final   MCHC 02/10/2024 32.2  30.0 - 36.0 g/dL Final   RDW 93/84/7974 14.8  11.5 - 15.5 % Final   Platelets 02/10/2024 331  150 - 400 K/uL Final   nRBC 02/10/2024 0.0  0.0 - 0.2 % Final   Neutrophils Relative % 02/10/2024 83  % Final   Neutro Abs 02/10/2024 14.0 (H)  1.7 - 7.7 K/uL Final   Lymphocytes Relative 02/10/2024 9  % Final   Lymphs Abs 02/10/2024 1.6  0.7 - 4.0 K/uL Final   Monocytes Relative 02/10/2024 6  % Final   Monocytes Absolute 02/10/2024 1.0  0.1 - 1.0 K/uL Final   Eosinophils Relative 02/10/2024 1  % Final   Eosinophils Absolute 02/10/2024 0.1  0.0 - 0.5 K/uL Final   Basophils Relative 02/10/2024 0  % Final   Basophils Absolute 02/10/2024 0.1  0.0 - 0.1 K/uL Final   Immature Granulocytes 02/10/2024 1  % Final   Abs Immature Granulocytes 02/10/2024 0.08 (H)  0.00 - 0.07 K/uL Final   Performed at [facility], [address] [place], [place] [address]   Sodium 02/10/2024 136  135 - 145 mmol/L Final   Potassium 02/10/2024 3.9  3.5 - 5.1 mmol/L Final   Chloride 02/10/2024 101  98 - 111 mmol/L Final   CO2 02/10/2024 25  22 - 32 mmol/L Final   Glucose, Bld 02/10/2024 94  70 -  99 mg/dL Final   Glucose reference range applies only to samples taken after fasting for at least 8 hours.   BUN 02/10/2024 7  6 - 20 mg/dL Final   Creatinine, Ser 02/10/2024 0.66  0.44 - 1.00 mg/dL Final   Calcium 93/84/7974 8.7 (L)  8.9 - 10.3 mg/dL Final   Total  Protein 02/10/2024 6.8  6.5 - 8.1 g/dL Final   Albumin 93/84/7974 3.2 (L)  3.5 - 5.0 g/dL Final   AST 93/84/7974 215 (H)  15 - 41 U/L Final   ALT 02/10/2024 143 (H)  0 - 44 U/L Final   Alkaline Phosphatase 02/10/2024 82  38 - 126 U/L Final   Total Bilirubin 02/10/2024 1.0  0.0 - 1.2 mg/dL Final   GFR, Estimated 02/10/2024 >60  >60 mL/min Final   Comment: (NOTE) Calculated using the CKD-EPI Creatinine Equation (2021)    Anion gap 02/10/2024 10  5 - 15 Final   Performed at [facility], [address], [place], [place] [address]   Lipase 02/10/2024 25  11 - 51 U/L Final   Performed at [facility], [address], [place], [place] [address]   Specimen Source 02/10/2024 URINE, CLEAN CATCH   Final   Color, Urine 02/10/2024 YELLOW  YELLOW Final   APPearance 02/10/2024 CLOUDY (A)  CLEAR Final   Specific Gravity, Urine 02/10/2024 1.015  1.005 - 1.030 Final   pH 02/10/2024 7.0  5.0 - 8.0 Final   Glucose, UA 02/10/2024 NEGATIVE  NEGATIVE mg/dL Final   Hgb urine dipstick 02/10/2024 NEGATIVE  NEGATIVE Final   Bilirubin Urine 02/10/2024 NEGATIVE  NEGATIVE Final   Ketones, ur 02/10/2024 NEGATIVE  NEGATIVE mg/dL Final   Protein, ur 93/84/7974 30 (A)  NEGATIVE mg/dL Final   Nitrite 93/84/7974 POSITIVE (A)  NEGATIVE Final   Leukocytes,Ua 02/10/2024 MODERATE (A)  NEGATIVE Final   RBC / HPF 02/10/2024 0-5  0 - 5 RBC/hpf Final   WBC, UA 02/10/2024 21-50  0 - 5 WBC/hpf Final   Comment:        Reflex urine culture not performed if WBC <=10, OR if Squamous epithelial cells >5. If Squamous epithelial cells >5 suggest recollection.    Bacteria, UA 02/10/2024 FEW (A)  NONE SEEN Final   Squamous Epithelial / HPF 02/10/2024 0-5  0 - 5 /HPF Final   Mucus 02/10/2024 PRESENT   Final   Amorphous Crystal 02/10/2024 PRESENT   Final   Performed at [facility], [address] [place], [place] [address]   Preg Test, Ur 02/10/2024 NEGATIVE  NEGATIVE Final   Comment:        THE SENSITIVITY OF  THIS METHODOLOGY IS >25 mIU/mL. Performed at [facility], [address] [place], [place] [address]    Specimen Description 02/10/2024 BLOOD RIGHT FOREARM   Final   Special Requests 02/10/2024 BOTTLES DRAWN AEROBIC AND ANAEROBIC Blood Culture adequate volume   Final   Culture 02/10/2024    Final                   Value:NO GROWTH 5 DAYS Performed at [facility], [address], [place], [place] [address]    Report Status 02/10/2024 02/15/2024 FINAL   Final   Specimen Description 02/10/2024 BLOOD LEFT ANTECUBITAL   Final   Special Requests 02/10/2024 BOTTLES DRAWN AEROBIC AND ANAEROBIC Blood Culture results may not be optimal due to an inadequate volume of blood received in culture bottles  Final   Culture 02/10/2024    Final                   Value:NO GROWTH 5 DAYS Performed at [facility], [address] [place], [place] [address]    Report Status 02/10/2024 02/15/2024 FINAL   Final   Neisseria Gonorrhea 02/10/2024 Positive (A)   Final   Chlamydia 02/10/2024 Negative   Final   Comment 02/10/2024 Normal Reference Ranger Chlamydia - Negative   Final   Comment 02/10/2024 Normal Reference Range Neisseria Gonorrhea - Negative   Final   Yeast Wet Prep HPF POC 02/10/2024 NONE SEEN  NONE SEEN Final   Trich, Wet Prep 02/10/2024 NONE SEEN  NONE SEEN Final   Clue Cells Wet Prep HPF POC 02/10/2024 NONE SEEN  NONE SEEN Final   WBC, Wet Prep HPF POC 02/10/2024 >=10 (A)  <10 Final   Sperm 02/10/2024 NONE SEEN   Final   Performed at [facility], [address] [place], [place] [address]   HIV Screen 4th Generation wRfx 02/10/2024 Non Reactive  Non Reactive Final   Performed at [facility], [address] [place], [place] [address]   RPR Ser Ql 02/10/2024 NON REACTIVE  NON REACTIVE Final   Performed at [facility], [address] [place], [place] [address]   Hepatitis B Surface Ag 02/10/2024 NON REACTIVE  NON REACTIVE Final   HCV Ab 02/10/2024 Reactive  (A)  NON REACTIVE Final   Comment: (NOTE) The CDC recommends that a Reactive HCV antibody result be followed up  with a HCV Nucleic Acid Amplification test.     Hep A IgM 02/10/2024 NON REACTIVE  NON REACTIVE Final   Hep B C IgM 02/10/2024 NON REACTIVE  NON REACTIVE Final   Performed at [facility], [address], [place], [place] [address]   Specimen Description 02/10/2024 URINE, RANDOM   Final   Special Requests 02/10/2024    Final                   Value:NONE Reflexed from [id number] Performed at [facility], [address], [place], [place] [address]    Culture 02/10/2024 MULTIPLE SPECIES PRESENT, SUGGEST RECOLLECTION (A)   Final   Report Status 02/10/2024 02/11/2024 FINAL   Final   WBC 02/11/2024 17.9 (H)  4.0 - 10.5 K/uL Final   RBC 02/11/2024 4.32  3.87 - 5.11 MIL/uL Final   Hemoglobin 02/11/2024 12.4  12.0 - 15.0 g/dL Final   HCT 93/83/7974 38.1  36.0 - 46.0 % Final   MCV 02/11/2024 88.2  80.0 - 100.0 fL Final   MCH 02/11/2024 28.7  26.0 - 34.0 pg Final   MCHC 02/11/2024 32.5  30.0 - 36.0 g/dL Final   RDW 93/83/7974 14.9  11.5 - 15.5 % Final   Platelets 02/11/2024 328  150 - 400 K/uL Final   nRBC 02/11/2024 0.0  0.0 - 0.2 % Final   Performed at [facility], [address] [place], [place] [address]   Sodium 02/11/2024 134 (L)  135 - 145 mmol/L Final   Potassium 02/11/2024 4.1  3.5 - 5.1 mmol/L Final   Chloride 02/11/2024 103  98 - 111 mmol/L Final   CO2 02/11/2024 23  22 - 32 mmol/L Final   Glucose, Bld 02/11/2024 245 (H)  70 - 99 mg/dL Final   Glucose reference range applies only to samples taken after fasting for  at least 8 hours.   BUN 02/11/2024 9  6 - 20 mg/dL Final   Creatinine, Ser 02/11/2024 0.76  0.44 - 1.00 mg/dL Final   Calcium 93/83/7974 7.9 (L)  8.9 - 10.3 mg/dL Final   Total Protein 93/83/7974 5.7 (L)  6.5 - 8.1 g/dL Final   Albumin 93/83/7974 2.3 (L)  3.5 - 5.0 g/dL Final   AST 93/83/7974 109 (H)  15 - 41 U/L Final   ALT 02/11/2024 95  (H)  0 - 44 U/L Final   Alkaline Phosphatase 02/11/2024 72  38 - 126 U/L Final   Total Bilirubin 02/11/2024 0.7  0.0 - 1.2 mg/dL Final   GFR, Estimated 02/11/2024 >60  >60 mL/min Final   Comment: (NOTE) Calculated using the CKD-EPI Creatinine Equation (2021)    Anion gap 02/11/2024 8  5 - 15 Final   Performed at [facility], [address], [place], [place] [address]   HCV RNA Qnt(log copy/mL) 02/11/2024 6.778  log10 IU/mL Corrected   HepC Qn 02/11/2024 6,000,000  IU/mL Final   Test Information 02/11/2024 Comment   Final   Comment: (NOTE) The quantitative range of this assay is 15 IU/mL to 100 million IU/mL.    Hcv Genotype 02/11/2024 Comment   Final   Comment: (NOTE) To be performed on this specimen. Performed At: [facility] [address] [place], [place] [id number] Jennette Shorter MD Ey:[id number]    aPTT 02/11/2024 37 (H)  24 - 36 seconds Final   Comment:        IF BASELINE aPTT IS ELEVATED, SUGGEST PATIENT RISK ASSESSMENT BE USED TO DETERMINE APPROPRIATE ANTICOAGULANT THERAPY. Performed at [facility], [address], [place], [place] [address]    Prothrombin Time 02/11/2024 15.4 (H)  11.4 - 15.2 seconds Final   INR 02/11/2024 1.2  0.8 - 1.2 Final   Comment: (NOTE) INR goal varies based on device and disease states. Performed at [facility], [address] [place], [place] [address]    Fibrinogen  02/11/2024 524 (H)  210 - 475 mg/dL Final   Comment: (NOTE) Fibrinogen  results may be underestimated in patients receiving thrombolytic therapy. Performed at [facility], [address] [place], [place] [address]    TSH 02/11/2024 0.373  0.350 - 4.500 uIU/mL Final   Comment: Performed by a 3rd Generation assay with a functional sensitivity of <=0.01 uIU/mL. Performed at [facility], [address] [place], [place] [address]    WBC 02/12/2024 9.0  4.0 - 10.5 K/uL Final   RBC 02/12/2024 4.32  3.87 - 5.11 MIL/uL Final    Hemoglobin 02/12/2024 12.6  12.0 - 15.0 g/dL Final   HCT 93/82/7974 38.4  36.0 - 46.0 % Final   MCV 02/12/2024 88.9  80.0 - 100.0 fL Final   MCH 02/12/2024 29.2  26.0 - 34.0 pg Final   MCHC 02/12/2024 32.8  30.0 - 36.0 g/dL Final   RDW 93/82/7974 14.9  11.5 - 15.5 % Final   Platelets 02/12/2024 340  150 - 400 K/uL Final   nRBC 02/12/2024 0.0  0.0 - 0.2 % Final   Performed at [facility], [address], [place], [place] [address]   Sodium 02/12/2024 138  135 - 145 mmol/L Final   Potassium 02/12/2024 3.9  3.5 - 5.1 mmol/L Final   Chloride 02/12/2024 106  98 - 111 mmol/L Final   CO2 02/12/2024 22  22 - 32 mmol/L Final   Glucose,  Bld 02/12/2024 147 (H)  70 - 99 mg/dL Final   Glucose reference range applies only to samples taken after fasting for at least 8 hours.   BUN 02/12/2024 10  6 - 20 mg/dL Final   Creatinine, Ser 02/12/2024 0.66  0.44 - 1.00 mg/dL Final   Calcium 93/82/7974 8.2 (L)  8.9 - 10.3 mg/dL Final   Total Protein 93/82/7974 5.9 (L)  6.5 - 8.1 g/dL Final   Albumin 93/82/7974 2.2 (L)  3.5 - 5.0 g/dL Final   AST 93/82/7974 133 (H)  15 - 41 U/L Final   ALT 02/12/2024 93 (H)  0 - 44 U/L Final   Alkaline Phosphatase 02/12/2024 63  38 - 126 U/L Final   Total Bilirubin 02/12/2024 0.4  0.0 - 1.2 mg/dL Final   GFR, Estimated 02/12/2024 >60  >60 mL/min Final   Comment: (NOTE) Calculated using the CKD-EPI Creatinine Equation (2021)    Anion gap 02/12/2024 10  5 - 15 Final   Performed at [facility], [address], [place], [place] [address]   Hgb A1c MFr Bld 02/12/2024 5.4  4.8 - 5.6 % Final   Comment: (NOTE) Diagnosis of Diabetes The following HbA1c ranges recommended by the American Diabetes Association (ADA) may be used as an aid in the diagnosis of diabetes mellitus.  Hemoglobin             Suggested A1C NGSP%              Diagnosis  <5.7                   Non Diabetic  5.7-6.4                Pre-Diabetic  >6.4                   Diabetic  <7.0                    Glycemic control for                       adults with diabetes.     Mean Plasma Glucose 02/12/2024 108.28  mg/dL Final   Performed at [facility], [address], [place], [place] [address]   Hepatitis C Genotype 02/11/2024 1a   Final   Comment: (NOTE) This test was developed and its performance characteristics determined by [facility]. It has not been cleared or approved by the Food and Drug Administration. Performed At: [facility] [address] [place], [place] [id number] Jennette Shorter MD Ey:[id number]     Blood Alcohol level:  Lab Results  Component Value Date   [facility] <15 07/08/2024    Metabolic Disorder Labs: Lab Results  Component Value Date   HGBA1C 5.5 07/08/2024   MPG 111.15 07/08/2024   MPG 108.28 02/12/2024   No results found for: PROLACTIN Lab Results  Component Value Date   CHOL 174 07/08/2024   TRIG 102 07/08/2024   HDL 42 07/08/2024   CHOLHDL 4.1 07/08/2024   VLDL 20 07/08/2024   LDLCALC 112 (H) 07/08/2024   LDLCALC 73 09/22/2020    Therapeutic Lab Levels: No results found for: LITHIUM No results found for: VALPROATE No results found for: CBMZ  Physical Findings   GAD-7    Flowsheet Row Procedure visit from 11/01/2020 in Center for [facility] at [facility] Office Visit from 09/22/2020 in Center for [employer] at  [facility] Clinical Support from 06/21/2020 in Center for [facility] at [facility] Clinical Support from 04/05/2020 in Center for [employer] at [facility] Clinical Support from 01/19/2020 in Center for [facility] at [facility]  Total GAD-7 Score 7 2 10 8  0   PHQ2-9    Flowsheet Row ED from 07/28/2024 in [facility] ED from 07/10/2024 in [facility] ED from 07/08/2024 in [facility]  Procedure visit from 11/01/2020 in Center for [employer] [facility] at [facility] Office Visit from 09/22/2020 in Center for [employer] [facility] at [facility]  PHQ-2 Total Score 6 2 4  0 0  PHQ-9 Total Score 19 7 12  0 2   Flowsheet Row ED from 07/28/2024 in [facility] ED from 07/10/2024 in [facility] ED from 07/08/2024 in [facility]  C-SSRS RISK CATEGORY No Risk No Risk No Risk     Musculoskeletal  Strength & Muscle Tone: within normal limits Gait & Station: normal Patient leans: N/A  Psychiatric Specialty Exam  Presentation  General Appearance:  Disheveled  Eye Contact: Fair  Speech: Clear and Coherent  Speech Volume: Normal  Handedness: Right   Mood and Affect  Mood: Depressed  Affect: Appropriate   Thought Process  Thought Processes: Coherent  Descriptions of Associations:Intact  Orientation:Full (Time, Place and Person)  Thought Content:WDL  Diagnosis of Schizophrenia or Schizoaffective disorder in past: No    Hallucinations:Hallucinations: None  Ideas of Reference:None  Suicidal Thoughts:Suicidal Thoughts: No  Homicidal Thoughts:Homicidal Thoughts: No   Sensorium  Memory: Immediate Fair  Judgment: Poor  Insight: Fair   Chartered Certified Accountant: Fair  Attention Span: Fair  Recall: [employer] of Knowledge: Fair  Language: Fair   Psychomotor Activity  Psychomotor Activity: Psychomotor Activity: Normal   Assets  Assets: Manufacturing Systems Engineer; Desire for Improvement   Sleep  Sleep: Sleep: Fair  Estimated Sleeping Duration (Last 24 Hours): 5.50-7.00 hours  Nutritional Assessment (For OBS and FBC admissions only) Has the patient had a weight loss or gain of 10 pounds or more in the last 3 months?: No Has the patient had a decrease in food intake/or appetite?: No Does the patient have  dental problems?: No Does the patient have eating habits or behaviors that may be indicators of an eating disorder including binging or inducing vomiting?: No Has the patient recently lost weight without trying?: 0 Has the patient been eating poorly because of a decreased appetite?: 0 Malnutrition Screening Tool Score: 0    Physical Exam  Physical Exam Vitals and nursing note reviewed.  HENT:     Nose: Nose normal.  Cardiovascular:     Rate and Rhythm: Normal rate.  Pulmonary:     Effort: Pulmonary effort is normal.  Musculoskeletal:        General: Normal range of motion.  Neurological:     Mental Status: She is alert and oriented to person, place, and time.  Psychiatric:        Attention and Perception: Attention normal. She does not perceive auditory or visual hallucinations.        Mood and Affect: Mood is depressed.        Speech: Speech normal.        Behavior: Behavior normal. Behavior is cooperative.  Thought Content: Thought content normal. Thought content is not paranoid. Thought content does not include homicidal or suicidal ideation. Thought content does not include homicidal or suicidal plan.        Cognition and Memory: Cognition normal.    Review of Systems  Constitutional: Negative.   HENT: Negative.    Eyes: Negative.   Respiratory: Negative.    Cardiovascular: Negative.   Gastrointestinal: Negative.   Genitourinary: Negative.   Musculoskeletal: Negative.   Skin: Negative.   Neurological: Negative.   Endo/Heme/Allergies: Negative.   Psychiatric/Behavioral:  Positive for substance abuse. Negative for hallucinations and suicidal ideas. The patient is not nervous/anxious and does not have insomnia.    Blood pressure 109/63, pulse 75, temperature 98.2 F (36.8 C), temperature source Oral, resp. rate 20, SpO2 97%. There is no height or weight on file to calculate BMI.  Treatment Plan Summary: Daily contact with patient to assess and evaluate symptoms  and progress in treatment and Medication management  Continue opioid detox treatment in facility based crisis unit.  Continue current medication regimen of:   Clonidine  Detox protocol: -Clonidine  0.1 mg 4 times daily by mouth x10 doses, clonidine  0.1 mg by mouth every morning and nightly x4 doses, clonidine  0.1 mg by mouth daily before breakfast x2 doses  -maalox/mylanta 30 mL by mouth every 4 hours PRN for indigestion -hydroxyzine  (atarax ) tablet 25 mg by mouth every 6 hours PRN for anxiety -Dicyclomine  20 mg by mouth every 6 hours as needed for muscle spasms or abdominal cramping -feeding supplement (ENSURE) 237 mL by mouth bid between meals -Loperamide  2 to 4 mg by mouth as needed for diarrhea or loose stools -Methocarbamol  500 mg every 8 hours as needed for muscle spasms -Naproxen  500 mg twice daily as needed for aching, pain or discomfort -Ondansetron  disintegrating tablet 4mg  every 6 hours as needed/nausea or vomiting -milk of magnesia 30 mL by mouth daily as needed for mild constipation -nicotine  patch 21 mg transdermal over 24 hours daily  -trazodone  tablet 50 mg by mouth at bedtime PRN for sleep  Agitation protocol initiated: Mild agitation: -Haloperidol  5 mg oral 3 times daily as needed -Diphenhydramine  50 mg oral 3 times daily as needed  Moderate agitation: -Haloperidol  lactate 5 mg IM 3 times daily as needed -Diphenhydramine  injection 50 mg IM 3 times daily as needed -Lorazepam  2 mg IM 3 times daily as needed  Severe agitation: -Haloperidol  lactate 10 mg IM 3 times daily as needed -Diphenhydramine  injection 50 mg IM 3 times daily as needed -Lorazepam  2 mg IM 3 times daily as needed   Patient is currently interested in transitioning to residential substance abuse treatment at [facility] upon discharge from [facility]. Social work will follow-up with referrals and resources. Per care management note, she is going to be referred to [facility] today.   Keviana Guida,  NP 07/29/2024 9:40 AM

## 2024-07-29 NOTE — ED Notes (Signed)
 Patient is currently eating dinner with latest Cow score of 2. Patient endorses anxiety and was given naproxen  and robaxin  po prn earlier this shift along with scheduled clonidine . Patient continues to endorse anxiety stating hydroxyzine  doesn't work for me, give me seroquel . Provider notified and patient made aware of patient request to speak with provider. Patient remains overall redirectable and cooperative.

## 2024-07-29 NOTE — ED Notes (Signed)
 Pts. Guardian notified of Pts., Discharge and transfer To [facility] Adolescent verbalizes with understanding and gratitude.

## 2024-07-29 NOTE — ED Notes (Signed)
 Patient is in the bedroom calm and sleeping. NAD, Will monitor for safety.

## 2024-07-29 NOTE — Group Note (Signed)
 Group Topic: Positive Affirmations  Group Date: 07/29/2024 Start Time: 1500 End Time: 1540 Facilitators: Elnor Keven SAILOR  Department: [facility]  Number of Participants: 6  Group Focus: affirmation Treatment Modality:  Psychoeducation Interventions utilized were support Purpose: reinforce self-care  Name: Morgan Donaldson Date of Birth: 1980-10-25  MR: [id number]    Level of Participation: active Quality of Participation: engaged Interactions with others: gave feedback Mood/Affect: appropriate Triggers (if applicable): NA Cognition: coherent/clear Progress: Moderate Response: Discussed importance and effectiveness of positive self affirmations, Shared with others personal experiences of recovery and relapse. Shared positive self affirmations about healing and new chapters.  Plan: follow-up needed  Patients Problems:  Patient Active Problem List   Diagnosis Date Noted   Hyperthyroidism 07/16/2024   Opioid dependence with uncomplicated intoxication (HCC) 07/14/2024   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   Elevated liver function tests 07/14/2024   Opioid use with withdrawal (HCC) 07/14/2024   Polysubstance abuse (HCC) 07/10/2024   Pyosalpingitis 02/12/2024   Hepatitis C antibody detected 02/11/2024   PID (acute pelvic inflammatory disease) 02/10/2024   Gonorrhea 12/10/2023   Group A streptococcal infection 12/09/2023   IV drug abuse (HCC) 12/09/2023   Acute cystitis 12/09/2023   GAD (generalized anxiety disorder) 06/21/2020   Attention deficit hyperactivity disorder (ADHD), 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

## 2024-07-29 NOTE — Care Management (Signed)
 [facility] Care Management...  Writer met with patient to discuss discharge planning.  Patient reported showing up for scheduled appointment at [facility]  Patient reported being declined at [facility] due to testing positive.  Patient stated she like to be referred back to [facility] will refer to [facility] and [facility]

## 2024-07-29 NOTE — ED Provider Notes (Signed)
 Discussed with patient that medication assisted treatment for her opiate use disorder will be reconsidered once a decrease in her liver enzymes have been noted with a repeat CMP in 3 days. She demonstrates understanding. Reports that she had tried subutex  1-2 months ago and it also helped a little bit with her cravings. She would like to go back to [facility] after discharge because it is closer for her. Her fiance is in [place]. Discussed that there are other residential treatment programs in [place] as well. Reports anxiety, encouraged patient to request her nurse for the atarax  that has been ordered for her as needed. She wasn't aware that she had a reactive hepatitis C test in the past. Reports that she never shared her needles for drug use. Informed her that a hepatitis C antibody test has been ordered and is agreeable to this plan.

## 2024-07-30 DIAGNOSIS — F1124 Opioid dependence with opioid-induced mood disorder: Secondary | ICD-10-CM | POA: Diagnosis not present

## 2024-07-30 DIAGNOSIS — F1123 Opioid dependence with withdrawal: Secondary | ICD-10-CM | POA: Diagnosis not present

## 2024-07-30 DIAGNOSIS — F331 Major depressive disorder, recurrent, moderate: Secondary | ICD-10-CM | POA: Diagnosis not present

## 2024-07-30 DIAGNOSIS — F1410 Cocaine abuse, uncomplicated: Secondary | ICD-10-CM | POA: Diagnosis not present

## 2024-07-30 LAB — HEPATIC FUNCTION PANEL
ALT: 245 U/L — ABNORMAL HIGH (ref 0–44)
AST: 139 U/L — ABNORMAL HIGH (ref 15–41)
Albumin: 3 g/dL — ABNORMAL LOW (ref 3.5–5.0)
Alkaline Phosphatase: 91 U/L (ref 38–126)
Bilirubin, Direct: <0.1 mg/dL (ref 0.0–0.2)
Total Bilirubin: 0.3 mg/dL (ref 0.0–1.2)
Total Protein: 6.2 g/dL — ABNORMAL LOW (ref 6.5–8.1)

## 2024-07-30 MED ORDER — HYDROXYZINE HCL 25 MG PO TABS
50.0000 mg | ORAL_TABLET | Freq: Four times a day (QID) | ORAL | Status: DC | PRN
  Administered 2024-07-30 – 2024-07-31 (×2): 50 mg via ORAL
  Filled 2024-07-30 (×2): qty 2
  Filled 2024-07-30: qty 20

## 2024-07-30 NOTE — ED Notes (Signed)
 Patient resting with eyes closed in no apparent acute distress. Respirations even and unlabored. Environment secured. Safety checks in place according to facility policy.

## 2024-07-30 NOTE — Group Note (Signed)
 Group Topic: Recovery Basics  Group Date: 07/30/2024 Start Time: 1400 End Time: 1500 Facilitators: Elnor Keven SAILOR  Department: [facility]  Number of Participants: 5  Group Focus: abuse issues Treatment Modality:  Psychoeducation Interventions utilized were support Purpose: increase insight  Name: Morgan Donaldson Date of Birth: 1981/07/02  MR: [id number]    Level of Participation: Pt did not attend group Quality of Participation: NA Interactions with others: NA Mood/Affect: NA Triggers (if applicable): NA Cognition: NA Progress: None Response: NA Plan: follow-up needed  Patients Problems:  Patient Active Problem List   Diagnosis Date Noted   Hyperthyroidism 07/16/2024   Opioid dependence with uncomplicated intoxication (HCC) 07/14/2024   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   Elevated liver function tests 07/14/2024   Opioid use with withdrawal (HCC) 07/14/2024   Polysubstance abuse (HCC) 07/10/2024   Pyosalpingitis 02/12/2024   Hepatitis C antibody detected 02/11/2024   PID (acute pelvic inflammatory disease) 02/10/2024   Gonorrhea 12/10/2023   Group A streptococcal infection 12/09/2023   IV drug abuse (HCC) 12/09/2023   Acute cystitis 12/09/2023   GAD (generalized anxiety disorder) 06/21/2020   Attention deficit hyperactivity disorder (ADHD), 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

## 2024-07-30 NOTE — ED Notes (Signed)
 Patient alert & oriented x4. Denies intent to harm self or others when asked. Denies A/VH. Patient denies any physical complaints when asked. No acute distress noted. Support and encouragement provided. Patient observed in milieu. No inappropriate behaviors observed or reported. Patient irritable and anxious, upset regarding care at this time and states she needs to be here for 7 days. Patient has also voiced concern regarding her hep c results which are currently pending, becoming tearful when speaking to writer regarding this lab test. Writer has informed patient as soon as the lab results I will inform her of the results, patient voices understanding. Routine safety checks conducted per facility protocol. Encouraged patient to notify staff if any thoughts of harm towards self or others arise. Patient verbalizes understanding and agreement.

## 2024-07-30 NOTE — ED Notes (Signed)
 Patient sitting in dayroom, calm and composed. No acute distress noted. No concerns voiced. No inappropriate behaviors observed or reported at this time. Informed patient to notify staff with any needs or assistance. Patient verbalized understanding or agreement. Safety checks in place per facility policy.

## 2024-07-30 NOTE — Group Note (Unsigned)
 Group Topic: Relapse and Recovery  Group Date: 07/30/2024 Start Time: 2000 End Time: 2100 Facilitators: Joan Plowman B  Department: [facility]  Number of Participants: 2  Group Focus: abuse issues Treatment Modality:  Psychoeducation Interventions utilized were leisure development Purpose: express feelings  Name: Morgan Donaldson Date of Birth: 07/18/1981  MR: [id number]    Level of Participation: {THERAPIES; PSYCH GROUP PARTICIPATION OZCZO:76008} Quality of Participation: {THERAPIES; PSYCH QUALITY OF PARTICIPATION:23992} Interactions with others: {THERAPIES; PSYCH INTERACTIONS:23993} Mood/Affect: {THERAPIES; PSYCH MOOD/AFFECT:23994} Triggers (if applicable): *** Cognition: {THERAPIES; PSYCH COGNITION:23995} Progress: {THERAPIES; PSYCH PROGRESS:23997} Response: *** Plan: {THERAPIES; PSYCH EOJW:76003}  Patients Problems:  Patient Active Problem List   Diagnosis Date Noted  . Marijuana user 08/01/2024  . Homelessness 08/01/2024  . Cocaine abuse (HCC) 08/01/2024  . Elevated liver enzymes 08/01/2024  . Hyperthyroidism 07/16/2024  . Opioid dependence with uncomplicated intoxication (HCC) 07/14/2024  . 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  . Elevated liver function tests 07/14/2024  . Opioid use with withdrawal (HCC) 07/14/2024  . Polysubstance abuse (HCC) 07/10/2024  . Pyosalpingitis 02/12/2024  . Hepatitis C antibody detected 02/11/2024  . PID (acute pelvic inflammatory disease) 02/10/2024  . Gonorrhea 12/10/2023  . Group A streptococcal infection 12/09/2023  . IV drug abuse (HCC) 12/09/2023  . Acute cystitis 12/09/2023  . GAD (generalized anxiety disorder) 06/21/2020  . Attention deficit hyperactivity disorder (ADHD), combined type 06/21/2020  . Breakthrough bleeding on depo provera  05/12/2020  . Pap smear abnormality of cervix/human papillomavirus (HPV) positive 08/17/2019  . Hemorrhagic cyst of left ovary 08/11/2019  . Tobacco abuse  05/19/2019  . History of drug abuse in remission (HCC) 05/19/2019

## 2024-07-30 NOTE — ED Provider Notes (Signed)
 Behavioral Health Progress Note  Date and Time: 07/30/2024 1:40 PM Name: Morgan Donaldson MRN:  [id number]  Subjective: Morgan Donaldson is a 43 year-old female who originally presented to [facility] on 07/28/2024 requesting detox from fentanyl  and crack cocaine. On evaluation today, patient reports that I can tell I'm off my medicine because my anxiety has increased. She is afraid that she is going to curse someone out, so she reports staying in her room. Encouraged patient to use her coping skills. Reminded patient of our discussion yesterday of her past medications of suboxone , seroquel , and buspirone  currently being not resumed due to an elevation in her liver enzymes. Also that these psychiatric medications in combination with her polysubstance use and hx of hepatitis C are likely contributing to her elevated liver enzymes. Discussed with the patient that she can take hydroxyzine  as needed for her anxiety, but she reports that it doesn't help. Yesterday, she was reporting that her buspirone  wasn't working and today she is requesting to be restarted on it. Informed patient that this medication is also metabolized by the liver and that it isn't being resumed until we notice a decrease in the elevation of her liver enzymes. HCV ab w Reflex to [facility] P[facility] test result still pending. Urinalysis shows cloudy appearance, moderate leukocytes, and rare bacteria. Patient remains asymptomatic and denies urinary symptoms of pain/burning during urination, itching, and chills.   Case discussed with Dr. Cole who is agreeable to having a hepatic function panel ordered and consideration of restarting buspirone  for her anxiety if her liver enzymes are improving. Ordered a hepatic function today, which doesn't show much improvement from her previous results. Today, her AST remains unchanged at 139 and ALT mildly improved at 245 from 247 on 07/29/2024. Total protein remains unchanged at 6.2, mildly low. Albumin at 3.0, mildly  low. Current plan to redraw hepatic function levels on 08/01/2024 to monitor trend in her liver enzymes. Plan for her to follow-up with infectious disease and GI for her hx of hepatitis C.    She reports that her sleep has been okay and her appetite is fair. She is interested in going to the [facility] program even though we've discussed that there is no guarantee she will be accepted there. She denies any withdrawal symptoms. She attended 1 group yesterday and said that she isn't going to attend the AA one because she doesn't drink. She learned positive affirmations from the group yesterday. She is alert and oriented x 4. During evaluation, she is lying in bed with her face turned towards the wall. Her mood is irritable and her affect is congruent with her mood. Her speech is clear, coherent, and logical. Objectively, there is no evidence of responding to internal stimuli. She is able to converse coherently, goal-directed thoughts with no distractibility or pre-occupation. She denies suicidal and homicidal ideations. She denies auditory and visual hallucinations.   Patient is currently interested in transitioning to residential substance abuse treatment at [facility] upon discharge from [facility]. Per child psychotherapist, she has been accepted into [facility] on 08/06/2024 and the social worker has been in contact with her Peer Support Specialist to help with finding her a shelter until then.   Diagnosis:  Final diagnoses:  Polysubstance abuse (HCC)  Fentanyl  use disorder, severe, dependence (HCC)  Marijuana user  IV drug user  Homelessness  Cocaine abuse (HCC)  Elevated liver enzymes    Total Time spent with patient: 15 minutes  Past Psychiatric History: Bipolar disorder, PTSD, and polysubstance use  disorder Past Medical History:  Past Medical History:  Diagnosis Date   Bronchitis    Hydrosalpinx    bilateral   Palpitations    Physiological ovarian cysts   -polysubstance  abuse -pyosalpingitis -hepatitis C antibody detected (02/11/2024) -acute pelvic inflammatory disease -gonorrhea -group A streptococcal infection -IV drug abuse -acute cystitis -generalized anxiety disorder -ADHD, combined type -breakthrough bleeding on depo provera  -pap smear abnormality of cervix/HPV positive -hemorrhagic cyst of left ovary -tobacco abuse -history of drug abuse in remission  Family History: not on file Family Psychiatric  History: not on file Social History: Has been homeless for the past year. Unemployed. Fentanyl , cocaine, and weed use.    Sleep: Good  Appetite:  Fair  Current Medications:  Current Facility-Administered Medications  Medication Dose Route Frequency Provider Last Rate Last Admin   alum & mag hydroxide-simeth (MAALOX/MYLANTA) 200-200-20 MG/5ML suspension 30 mL  30 mL Oral Q4H PRN Onuoha, Chinwendu V, NP       cloNIDine  (CATAPRES ) tablet 0.1 mg  0.1 mg Oral QID Onuoha, Chinwendu V, NP   0.1 mg at 07/30/24 9083   Followed by   NOREEN ON 07/31/2024] cloNIDine  (CATAPRES ) tablet 0.1 mg  0.1 mg Oral BH-qamhs Onuoha, Chinwendu V, NP       Followed by   NOREEN ON 08/03/2024] cloNIDine  (CATAPRES ) tablet 0.1 mg  0.1 mg Oral QAC breakfast Onuoha, Chinwendu V, NP       dicyclomine  (BENTYL ) tablet 20 mg  20 mg Oral Q6H PRN Onuoha, Chinwendu V, NP       haloperidol  (HALDOL ) tablet 5 mg  5 mg Oral TID PRN Onuoha, Chinwendu V, NP       And   diphenhydrAMINE  (BENADRYL ) capsule 50 mg  50 mg Oral TID PRN Onuoha, Chinwendu V, NP       haloperidol  lactate (HALDOL ) injection 5 mg  5 mg Intramuscular TID PRN Onuoha, Chinwendu V, NP       And   diphenhydrAMINE  (BENADRYL ) injection 50 mg  50 mg Intramuscular TID PRN Onuoha, Chinwendu V, NP       And   LORazepam  (ATIVAN ) injection 2 mg  2 mg Intramuscular TID PRN Onuoha, Chinwendu V, NP       haloperidol  lactate (HALDOL ) injection 10 mg  10 mg Intramuscular TID PRN Onuoha, Chinwendu V, NP       And   diphenhydrAMINE   (BENADRYL ) injection 50 mg  50 mg Intramuscular TID PRN Onuoha, Chinwendu V, NP       And   LORazepam  (ATIVAN ) injection 2 mg  2 mg Intramuscular TID PRN Onuoha, Chinwendu V, NP       feeding supplement (ENSURE PLUS HIGH PROTEIN) liquid 237 mL  237 mL Oral BID BM Onuoha, Chinwendu V, NP   237 mL at 07/30/24 0917   hydrOXYzine  (ATARAX ) tablet 25 mg  25 mg Oral Q6H PRN Onuoha, Chinwendu V, NP   25 mg at 07/30/24 1254   loperamide  (IMODIUM ) capsule 2-4 mg  2-4 mg Oral PRN Onuoha, Chinwendu V, NP       magnesium  hydroxide (MILK OF MAGNESIA) suspension 30 mL  30 mL Oral Daily PRN Onuoha, Chinwendu V, NP       methocarbamol  (ROBAXIN ) tablet 500 mg  500 mg Oral Q8H PRN Onuoha, Chinwendu V, NP   500 mg at 07/29/24 1456   naproxen  (NAPROSYN ) tablet 500 mg  500 mg Oral BID PRN Onuoha, Chinwendu V, NP   500 mg at 07/29/24 1456   nicotine  (NICODERM CQ  -  dosed in mg/24 hours) patch 21 mg  21 mg Transdermal Daily Onuoha, Chinwendu V, NP   21 mg at 07/30/24 9082   ondansetron  (ZOFRAN -ODT) disintegrating tablet 4 mg  4 mg Oral Q6H PRN Onuoha, Chinwendu V, NP       traZODone  (DESYREL ) tablet 50 mg  50 mg Oral QHS PRN Onuoha, Chinwendu V, NP   50 mg at 07/29/24 2140   Current Outpatient Medications  Medication Sig Dispense Refill   buprenorphine -naloxone  (SUBOXONE ) 8-2 mg SUBL SL tablet Place 1 tablet under the tongue daily at 6 (six) AM. 30 tablet 0   busPIRone  (BUSPAR ) 10 MG tablet Take 2 tablets (20 mg total) by mouth 2 (two) times daily. 30 tablet 0   feeding supplement (ENSURE PLUS HIGH PROTEIN) LIQD Take 237 mLs by mouth 2 (two) times daily between meals. 14220 mL 0   nicotine  (NICODERM CQ  - DOSED IN MG/24 HOURS) 21 mg/24hr patch Place 1 patch (21 mg total) onto the skin daily. 28 patch 0   polyethylene glycol (MIRALAX  / GLYCOLAX ) 17 g packet Take 17 g by mouth daily as needed for mild constipation or moderate constipation. 24 each 0   QUEtiapine  (SEROQUEL ) 100 MG tablet Take 1 tablet (100 mg total) by mouth  every morning. 30 tablet 0   QUEtiapine  (SEROQUEL ) 300 MG tablet Take 1 tablet (300 mg total) by mouth at bedtime. 30 tablet 0   senna-docusate (SENOKOT-S) 8.6-50 MG tablet Take 1 tablet by mouth 2 (two) times daily. 60 tablet 0   traZODone  (DESYREL ) 100 MG tablet Take 1 tablet (100 mg total) by mouth at bedtime as needed for sleep. 30 tablet 0    Labs  Lab Results:  Admission on 07/28/2024  Component Date Value Ref Range Status   WBC 07/29/2024 6.5  4.0 - 10.5 K/uL Final   RBC 07/29/2024 3.96  3.87 - 5.11 MIL/uL Final   Hemoglobin 07/29/2024 11.7 (L)  12.0 - 15.0 g/dL Final   HCT 87/97/7974 35.4 (L)  36.0 - 46.0 % Final   MCV 07/29/2024 89.4  80.0 - 100.0 fL Final   MCH 07/29/2024 29.5  26.0 - 34.0 pg Final   MCHC 07/29/2024 33.1  30.0 - 36.0 g/dL Final   RDW 87/97/7974 14.1  11.5 - 15.5 % Final   Platelets 07/29/2024 247  150 - 400 K/uL Final   nRBC 07/29/2024 0.0  0.0 - 0.2 % Final   Neutrophils Relative % 07/29/2024 45  % Final   Neutro Abs 07/29/2024 3.0  1.7 - 7.7 K/uL Final   Lymphocytes Relative 07/29/2024 35  % Final   Lymphs Abs 07/29/2024 2.3  0.7 - 4.0 K/uL Final   Monocytes Relative 07/29/2024 9  % Final   Monocytes Absolute 07/29/2024 0.6  0.1 - 1.0 K/uL Final   Eosinophils Relative 07/29/2024 10  % Final   Eosinophils Absolute 07/29/2024 0.6 (H)  0.0 - 0.5 K/uL Final   Basophils Relative 07/29/2024 1  % Final   Basophils Absolute 07/29/2024 0.0  0.0 - 0.1 K/uL Final   Immature Granulocytes 07/29/2024 0  % Final   Abs Immature Granulocytes 07/29/2024 0.02  0.00 - 0.07 K/uL Final   Performed at [facility], [address], [place], [place] [address]   Sodium 07/29/2024 140  135 - 145 mmol/L Final   Potassium 07/29/2024 3.8  3.5 - 5.1 mmol/L Final   Chloride 07/29/2024 109  98 - 111 mmol/L Final   CO2 07/29/2024 25  22 - 32  mmol/L Final   Glucose, Bld 07/29/2024 98  70 - 99 mg/dL Final   Glucose reference range applies only to samples taken after fasting for  at least 8 hours.   BUN 07/29/2024 15  6 - 20 mg/dL Final   Creatinine, Ser 07/29/2024 0.71  0.44 - 1.00 mg/dL Final   Calcium 87/97/7974 9.1  8.9 - 10.3 mg/dL Final   Total Protein 87/97/7974 6.2 (L)  6.5 - 8.1 g/dL Final   Albumin 87/97/7974 2.9 (L)  3.5 - 5.0 g/dL Final   AST 87/97/7974 139 (H)  15 - 41 U/L Final   ALT 07/29/2024 247 (H)  0 - 44 U/L Final   Alkaline Phosphatase 07/29/2024 90  38 - 126 U/L Final   Total Bilirubin 07/29/2024 0.5  0.0 - 1.2 mg/dL Final   GFR, Estimated 07/29/2024 >60  >60 mL/min Final   Comment: (NOTE) Calculated using the CKD-EPI Creatinine Equation (2021)    Anion gap 07/29/2024 6  5 - 15 Final   Performed at [facility], [address] [place], [place] [address]   Preg Test, Ur 07/29/2024 Negative  Negative Final   POC Amphetamine  UR 07/29/2024 Positive (A)  NONE DETECTED (Cut Off Level 1000 ng/mL) Final   POC Secobarbital (BAR) 07/29/2024 None Detected  NONE DETECTED (Cut Off Level 300 ng/mL) Final   POC Buprenorphine  (BUP) 07/29/2024 Positive (A)  NONE DETECTED (Cut Off Level 10 ng/mL) Final   POC Oxazepam (BZO) 07/29/2024 None Detected  NONE DETECTED (Cut Off Level 300 ng/mL) Final   POC Cocaine UR 07/29/2024 Positive (A)  NONE DETECTED (Cut Off Level 300 ng/mL) Final   POC Methamphetamine UR 07/29/2024 Positive (A)  NONE DETECTED (Cut Off Level 1000 ng/mL) Final   POC Morphine  07/29/2024 None Detected  NONE DETECTED (Cut Off Level 300 ng/mL) Final   POC Methadone UR 07/29/2024 None Detected  NONE DETECTED (Cut Off Level 300 ng/mL) Final   POC Oxycodone  UR 07/29/2024 None Detected  NONE DETECTED (Cut Off Level 100 ng/mL) Final   POC Marijuana UR 07/29/2024 Positive (A)  NONE DETECTED (Cut Off Level 50 ng/mL) Final   Color, Urine 07/29/2024 YELLOW  YELLOW Final   APPearance 07/29/2024 CLOUDY (A)  CLEAR Final   Specific Gravity, Urine 07/29/2024 1.018  1.005 - 1.030 Final   pH 07/29/2024 7.0  5.0 - 8.0 Final   Glucose, UA 07/29/2024  NEGATIVE  NEGATIVE mg/dL Final   Hgb urine dipstick 07/29/2024 NEGATIVE  NEGATIVE Final   Bilirubin Urine 07/29/2024 NEGATIVE  NEGATIVE Final   Ketones, ur 07/29/2024 NEGATIVE  NEGATIVE mg/dL Final   Protein, ur 87/97/7974 NEGATIVE  NEGATIVE mg/dL Final   Nitrite 87/97/7974 NEGATIVE  NEGATIVE Final   Leukocytes,Ua 07/29/2024 MODERATE (A)  NEGATIVE Final   RBC / HPF 07/29/2024 0-5  0 - 5 RBC/hpf Final   WBC, UA 07/29/2024 6-10  0 - 5 WBC/hpf Final   Bacteria, UA 07/29/2024 RARE (A)  NONE SEEN Final   Squamous Epithelial / HPF 07/29/2024 0-5  0 - 5 /HPF Final   Amorphous Crystal 07/29/2024 PRESENT   Final   Performed at [facility], [address], [place], [place] [address]   Total Protein 07/29/2024 6.2 (L)  6.5 - 8.1 g/dL Final   Albumin 87/97/7974 3.0 (L)  3.5 - 5.0 g/dL Final   AST 87/97/7974 139 (H)  15 - 41 U/L Final   ALT 07/29/2024 245 (H)  0 - 44 U/L Final   Alkaline Phosphatase  07/29/2024 91  38 - 126 U/L Final   Total Bilirubin 07/29/2024 0.3  0.0 - 1.2 mg/dL Final   Bilirubin, Direct 07/29/2024 <0.1  0.0 - 0.2 mg/dL Final   Indirect Bilirubin 07/29/2024 NOT CALCULATED  0.3 - 0.9 mg/dL Final   Performed at [facility], [address] [place], [place] [address]  Admission on 07/10/2024, Discharged on 07/22/2024  Component Date Value Ref Range Status   T3, Free 07/11/2024 2.2  2.0 - 4.4 pg/mL Final   Comment: (NOTE) Performed At: [facility] [address] [place], [place] [id number] Jennette Shorter MD Ey:[id number]    Free T4 07/11/2024 0.71  0.61 - 1.12 ng/dL Final   Comment: (NOTE) Biotin ingestion may interfere with free T4 tests. If the results are inconsistent with the TSH level, previous test results, or the clinical presentation, then consider biotin interference. If needed, order repeat testing after stopping biotin. Performed at [facility], [address] [place], [place] [address]    Neisseria Gonorrhea 07/12/2024 Negative    Final   Chlamydia 07/12/2024 Negative   Final   Comment 07/12/2024 Normal Reference Ranger Chlamydia - Negative   Final   Comment 07/12/2024 Normal Reference Range Neisseria Gonorrhea - Negative   Final   Total Protein 07/12/2024 7.1  6.5 - 8.1 g/dL Final   Albumin 88/84/7974 3.3 (L)  3.5 - 5.0 g/dL Final   AST 88/84/7974 67 (H)  15 - 41 U/L Final   ALT 07/12/2024 95 (H)  0 - 44 U/L Final   Alkaline Phosphatase 07/12/2024 110  38 - 126 U/L Final   Total Bilirubin 07/12/2024 0.6  0.0 - 1.2 mg/dL Final   Bilirubin, Direct 07/12/2024 <0.1  0.0 - 0.2 mg/dL Final   Indirect Bilirubin 07/12/2024 NOT CALCULATED  0.3 - 0.9 mg/dL Final   Performed at [facility], [address] [place], [place] [address]   RPR Ser Ql 07/12/2024 NON REACTIVE  NON REACTIVE Final   Performed at [facility], [address], [place], [place] [address]   [facility] test code 07/12/2024 [id number]   Final   [facility] test name 07/12/2024 [id number]   Final   Source ([facility]) 07/12/2024 SERUM   Final   Performed at [facility], [address] [place], [place] [address]   Misc [facility] result 07/12/2024 COMMENT   Final   Comment: (NOTE) Test Ordered: [id number] HIV Ab/p24 Ag with Reflex HIV Ab/p24 Ag Screen           Note:                     BN     Non Reactive                                                  Reference Range: Non Reactive                          HIV-1/HIV-2 antibodies and HIV-1 p24 antigen were NOT detected. There is no laboratory evidence of HIV infection. HIV Negative Performed At: [facility] [address] [place], [place] [id number] Jennette Shorter MD Ey:[id number]    Color, Urine 07/19/2024 YELLOW  YELLOW Final   APPearance 07/19/2024 CLOUDY (A)  CLEAR Final   Specific Gravity, Urine  07/19/2024 1.018  1.005 - 1.030 Final   pH 07/19/2024 6.0  5.0 - 8.0 Final   Glucose, UA 07/19/2024 NEGATIVE  NEGATIVE mg/dL Final   Hgb urine dipstick 07/19/2024 NEGATIVE  NEGATIVE Final    Bilirubin Urine 07/19/2024 NEGATIVE  NEGATIVE Final   Ketones, ur 07/19/2024 NEGATIVE  NEGATIVE mg/dL Final   Protein, ur 88/77/7974 NEGATIVE  NEGATIVE mg/dL Final   Nitrite 88/77/7974 NEGATIVE  NEGATIVE Final   Leukocytes,Ua 07/19/2024 LARGE (A)  NEGATIVE Final   RBC / HPF 07/19/2024 0-5  0 - 5 RBC/hpf Final   WBC, UA 07/19/2024 11-20  0 - 5 WBC/hpf Final   Bacteria, UA 07/19/2024 RARE (A)  NONE SEEN Final   Squamous Epithelial / HPF 07/19/2024 0-5  0 - 5 /HPF Final   Mucus 07/19/2024 PRESENT   Final   Performed at [facility], [address], [place], [place] [address]   Sodium 07/19/2024 139  135 - 145 mmol/L Final   Potassium 07/19/2024 4.0  3.5 - 5.1 mmol/L Final   Chloride 07/19/2024 103  98 - 111 mmol/L Final   CO2 07/19/2024 25  22 - 32 mmol/L Final   Glucose, Bld 07/19/2024 99  70 - 99 mg/dL Final   Glucose reference range applies only to samples taken after fasting for at least 8 hours.   BUN 07/19/2024 22 (H)  6 - 20 mg/dL Final   Creatinine, Ser 07/19/2024 0.78  0.44 - 1.00 mg/dL Final   Calcium 88/77/7974 8.8 (L)  8.9 - 10.3 mg/dL Final   Total Protein 88/77/7974 7.0  6.5 - 8.1 g/dL Final   Albumin 88/77/7974 3.3 (L)  3.5 - 5.0 g/dL Final   AST 88/77/7974 115 (H)  15 - 41 U/L Final   ALT 07/19/2024 184 (H)  0 - 44 U/L Final   Alkaline Phosphatase 07/19/2024 72  38 - 126 U/L Final   Total Bilirubin 07/19/2024 0.4  0.0 - 1.2 mg/dL Final   GFR, Estimated 07/19/2024 >60  >60 mL/min Final   Comment: (NOTE) Calculated using the CKD-EPI Creatinine Equation (2021)    Anion gap 07/19/2024 11  5 - 15 Final   Performed at [facility], [address] [place], [place] [address]   WBC 07/19/2024 10.6 (H)  4.0 - 10.5 K/uL Final   RBC 07/19/2024 4.38  3.87 - 5.11 MIL/uL Final   Hemoglobin 07/19/2024 12.7  12.0 - 15.0 g/dL Final   HCT 88/77/7974 39.1  36.0 - 46.0 % Final   MCV 07/19/2024 89.3  80.0 - 100.0 fL Final   MCH 07/19/2024 29.0  26.0 - 34.0 pg Final   MCHC  07/19/2024 32.5  30.0 - 36.0 g/dL Final   RDW 88/77/7974 14.0  11.5 - 15.5 % Final   Platelets 07/19/2024 305  150 - 400 K/uL Final   nRBC 07/19/2024 0.0  0.0 - 0.2 % Final   Neutrophils Relative % 07/19/2024 64  % Final   Neutro Abs 07/19/2024 6.8  1.7 - 7.7 K/uL Final   Lymphocytes Relative 07/19/2024 23  % Final   Lymphs Abs 07/19/2024 2.4  0.7 - 4.0 K/uL Final   Monocytes Relative 07/19/2024 7  % Final   Monocytes Absolute 07/19/2024 0.8  0.1 - 1.0 K/uL Final   Eosinophils Relative 07/19/2024 4  % Final   Eosinophils Absolute 07/19/2024 0.4  0.0 - 0.5 K/uL Final   Basophils Relative 07/19/2024 1  % Final   Basophils Absolute 07/19/2024 0.1  0.0 -  0.1 K/uL Final   Immature Granulocytes 07/19/2024 1  % Final   Abs Immature Granulocytes 07/19/2024 0.07  0.00 - 0.07 K/uL Final   Performed at [facility], [address] [place], [place] [address]  Admission on 07/08/2024, Discharged on 07/10/2024  Component Date Value Ref Range Status   WBC 07/08/2024 10.6 (H)  4.0 - 10.5 K/uL Final   RBC 07/08/2024 5.65 (H)  3.87 - 5.11 MIL/uL Final   Hemoglobin 07/08/2024 16.5 (H)  12.0 - 15.0 g/dL Final   HCT 88/88/7974 49.4 (H)  36.0 - 46.0 % Final   MCV 07/08/2024 87.4  80.0 - 100.0 fL Final   MCH 07/08/2024 29.2  26.0 - 34.0 pg Final   MCHC 07/08/2024 33.4  30.0 - 36.0 g/dL Final   RDW 88/88/7974 13.7  11.5 - 15.5 % Final   Platelets 07/08/2024 422 (H)  150 - 400 K/uL Final   nRBC 07/08/2024 0.0  0.0 - 0.2 % Final   Neutrophils Relative % 07/08/2024 75  % Final   Neutro Abs 07/08/2024 7.9 (H)  1.7 - 7.7 K/uL Final   Lymphocytes Relative 07/08/2024 17  % Final   Lymphs Abs 07/08/2024 1.8  0.7 - 4.0 K/uL Final   Monocytes Relative 07/08/2024 7  % Final   Monocytes Absolute 07/08/2024 0.7  0.1 - 1.0 K/uL Final   Eosinophils Relative 07/08/2024 0  % Final   Eosinophils Absolute 07/08/2024 0.0  0.0 - 0.5 K/uL Final   Basophils Relative 07/08/2024 1  % Final   Basophils Absolute  07/08/2024 0.1  0.0 - 0.1 K/uL Final   Immature Granulocytes 07/08/2024 0  % Final   Abs Immature Granulocytes 07/08/2024 0.04  0.00 - 0.07 K/uL Final   Performed at [facility], [address] [place], [place] [address]   Sodium 07/08/2024 140  135 - 145 mmol/L Final   Potassium 07/08/2024 4.1  3.5 - 5.1 mmol/L Final   Chloride 07/08/2024 100  98 - 111 mmol/L Final   CO2 07/08/2024 27  22 - 32 mmol/L Final   Glucose, Bld 07/08/2024 113 (H)  70 - 99 mg/dL Final   Glucose reference range applies only to samples taken after fasting for at least 8 hours.   BUN 07/08/2024 10  6 - 20 mg/dL Final   Creatinine, Ser 07/08/2024 0.74  0.44 - 1.00 mg/dL Final   Calcium 88/88/7974 9.9  8.9 - 10.3 mg/dL Final   Total Protein 88/88/7974 7.9  6.5 - 8.1 g/dL Final   Albumin 88/88/7974 3.5  3.5 - 5.0 g/dL Final   AST 88/88/7974 109 (H)  15 - 41 U/L Final   ALT 07/08/2024 132 (H)  0 - 44 U/L Final   Alkaline Phosphatase 07/08/2024 74  38 - 126 U/L Final   Total Bilirubin 07/08/2024 0.3  0.0 - 1.2 mg/dL Final   GFR, Estimated 07/08/2024 >60  >60 mL/min Final   Comment: (NOTE) Calculated using the CKD-EPI Creatinine Equation (2021)    Anion gap 07/08/2024 13  5 - 15 Final   Performed at [facility], [address] [place], [place] [address]   Hgb A1c MFr Bld 07/08/2024 5.5  4.8 - 5.6 % Final   Comment: (NOTE) Diagnosis of Diabetes The following HbA1c ranges recommended by the American Diabetes Association (ADA) may be used as an aid in the diagnosis of diabetes mellitus.  Hemoglobin             Suggested  A1C NGSP%              Diagnosis  <5.7                   Non Diabetic  5.7-6.4                Pre-Diabetic  >6.4                   Diabetic  <7.0                   Glycemic control for                       adults with diabetes.     Mean Plasma Glucose 07/08/2024 111.15  mg/dL Final   Performed at [facility], [address], [place], [place] [address]   Magnesium   07/08/2024 2.1  1.7 - 2.4 mg/dL Final   Performed at [facility], [address] [place], [place] [address]   Alcohol, Ethyl (B) 07/08/2024 <15  <15 mg/dL Final   Comment: (NOTE) For medical purposes only. Performed at [facility], [address] [place], [place] [address]    Cholesterol 07/08/2024 174  0 - 200 mg/dL Final   Triglycerides 88/88/7974 102  <150 mg/dL Final   HDL 88/88/7974 42  >40 mg/dL Final   Total CHOL/HDL Ratio 07/08/2024 4.1  RATIO Final   VLDL 07/08/2024 20  0 - 40 mg/dL Final   LDL Cholesterol 07/08/2024 112 (H)  0 - 99 mg/dL Final   Comment:        Total Cholesterol/HDL:CHD Risk Coronary Heart Disease Risk Table                     Men   Women  1/2 Average Risk   3.4   3.3  Average Risk       5.0   4.4  2 X Average Risk   9.6   7.1  3 X Average Risk  23.4   11.0        Use the calculated Patient Ratio above and the CHD Risk Table to determine the patient's CHD Risk.        ATP III CLASSIFICATION (LDL):  <100     mg/dL   Optimal  899-870  mg/dL   Near or Above                    Optimal  130-159  mg/dL   Borderline  839-810  mg/dL   High  >809     mg/dL   Very High Performed at [facility], [address] [place], [place] [address]    Color, Urine 07/09/2024 YELLOW  YELLOW Final   APPearance 07/09/2024 TURBID (A)  CLEAR Final   Specific Gravity, Urine 07/09/2024 1.017  1.005 - 1.030 Final   pH 07/09/2024 7.0  5.0 - 8.0 Final   Glucose, UA 07/09/2024 NEGATIVE  NEGATIVE mg/dL Final   Hgb urine dipstick 07/09/2024 NEGATIVE  NEGATIVE Final   Bilirubin Urine 07/09/2024 NEGATIVE  NEGATIVE Final   Ketones, ur 07/09/2024 NEGATIVE  NEGATIVE mg/dL Final   Protein, ur 88/87/7974 NEGATIVE  NEGATIVE mg/dL Final   Nitrite 88/87/7974 NEGATIVE  NEGATIVE Final   Leukocytes,Ua 07/09/2024 MODERATE (A)  NEGATIVE Final   RBC / HPF 07/09/2024 0-5  0 - 5 RBC/hpf Final   WBC, UA 07/09/2024 21-50  0 - 5 WBC/hpf Final   Bacteria, UA 07/09/2024 MANY  (A)  NONE SEEN Final   Squamous Epithelial / HPF 07/09/2024 0-5  0 - 5 /HPF Final   WBC Clumps 07/09/2024 PRESENT   Final   Performed at [facility], [address] [place], [place] [address]   Preg Test, Ur 07/09/2024 Negative  Negative Final   POC Amphetamine  UR 07/09/2024 None Detected  NONE DETECTED (Cut Off Level 1000 ng/mL) Final   POC Secobarbital (BAR) 07/09/2024 None Detected  NONE DETECTED (Cut Off Level 300 ng/mL) Final   POC Buprenorphine  (BUP) 07/09/2024 None Detected  NONE DETECTED (Cut Off Level 10 ng/mL) Final   POC Oxazepam (BZO) 07/09/2024 None Detected  NONE DETECTED (Cut Off Level 300 ng/mL) Final   POC Cocaine UR 07/09/2024 Positive (A)  NONE DETECTED (Cut Off Level 300 ng/mL) Final   POC Methamphetamine UR 07/09/2024 None Detected  NONE DETECTED (Cut Off Level 1000 ng/mL) Final   POC Morphine  07/09/2024 Positive (A)  NONE DETECTED (Cut Off Level 300 ng/mL) Final   POC Methadone UR 07/09/2024 None Detected  NONE DETECTED (Cut Off Level 300 ng/mL) Final   POC Oxycodone  UR 07/09/2024 None Detected  NONE DETECTED (Cut Off Level 100 ng/mL) Final   POC Marijuana UR 07/09/2024 Positive (A)  NONE DETECTED (Cut Off Level 50 ng/mL) Final   TSH 07/08/2024 0.112 (L)  0.350 - 4.500 uIU/mL Final   Comment: Performed by a 3rd Generation assay with a functional sensitivity of <=0.01 uIU/mL. Performed at [facility], [address] [place], [place] [address]    Preg Test, Ur 07/09/2024 NEGATIVE  NEGATIVE Final   Comment:        THE SENSITIVITY OF THIS METHODOLOGY IS >20 mIU/mL.   Admission on 05/17/2024, Discharged on 05/18/2024  Component Date Value Ref Range Status   WBC 05/17/2024 9.0  4.0 - 10.5 K/uL Final   RBC 05/17/2024 4.35  3.87 - 5.11 MIL/uL Final   Hemoglobin 05/17/2024 12.8  12.0 - 15.0 g/dL Final   HCT 90/79/7974 39.7  36.0 - 46.0 % Final   MCV 05/17/2024 91.3  80.0 - 100.0 fL Final   MCH 05/17/2024 29.4  26.0 - 34.0 pg Final   MCHC 05/17/2024 32.2   30.0 - 36.0 g/dL Final   RDW 90/79/7974 14.1  11.5 - 15.5 % Final   Platelets 05/17/2024 286  150 - 400 K/uL Final   nRBC 05/17/2024 0.0  0.0 - 0.2 % Final   Neutrophils Relative % 05/17/2024 65  % Final   Neutro Abs 05/17/2024 5.8  1.7 - 7.7 K/uL Final   Lymphocytes Relative 05/17/2024 23  % Final   Lymphs Abs 05/17/2024 2.1  0.7 - 4.0 K/uL Final   Monocytes Relative 05/17/2024 8  % Final   Monocytes Absolute 05/17/2024 0.8  0.1 - 1.0 K/uL Final   Eosinophils Relative 05/17/2024 4  % Final   Eosinophils Absolute 05/17/2024 0.3  0.0 - 0.5 K/uL Final   Basophils Relative 05/17/2024 0  % Final   Basophils Absolute 05/17/2024 0.0  0.0 - 0.1 K/uL Final   Immature Granulocytes 05/17/2024 0  % Final   Abs Immature Granulocytes 05/17/2024 0.04  0.00 - 0.07 K/uL Final   Performed at [facility], [address], [place], [place] [address]   Preg, Serum 05/17/2024 NEGATIVE  NEGATIVE Final   Comment:        THE SENSITIVITY OF THIS METHODOLOGY IS >10 mIU/mL.  Performed at [facility], [address] [place], [place] [address]    Sodium 05/17/2024 141  135 - 145 mmol/L Final   Potassium 05/17/2024 3.5  3.5 - 5.1 mmol/L Final   Chloride 05/17/2024 104  98 - 111 mmol/L Final   CO2 05/17/2024 26  22 - 32 mmol/L Final   Glucose, Bld 05/17/2024 118 (H)  70 - 99 mg/dL Final   Glucose reference range applies only to samples taken after fasting for at least 8 hours.   BUN 05/17/2024 11  6 - 20 mg/dL Final   Creatinine, Ser 05/17/2024 0.78  0.44 - 1.00 mg/dL Final   Calcium 90/79/7974 8.8 (L)  8.9 - 10.3 mg/dL Final   Total Protein 90/79/7974 7.0  6.5 - 8.1 g/dL Final   Albumin 90/79/7974 3.0 (L)  3.5 - 5.0 g/dL Final   AST 90/79/7974 115 (H)  15 - 41 U/L Final   ALT 05/17/2024 144 (H)  0 - 44 U/L Final   Alkaline Phosphatase 05/17/2024 71  38 - 126 U/L Final   Total Bilirubin 05/17/2024 0.5  0.0 - 1.2 mg/dL Final   GFR, Estimated 05/17/2024 >60  >60 mL/min Final   Comment:  (NOTE) Calculated using the CKD-EPI Creatinine Equation (2021)    Anion gap 05/17/2024 11  5 - 15 Final   Performed at [facility], [address] [place], [place] [address]   Sodium 05/17/2024 142  135 - 145 mmol/L Final   Potassium 05/17/2024 3.5  3.5 - 5.1 mmol/L Final   Chloride 05/17/2024 103  98 - 111 mmol/L Final   BUN 05/17/2024 12  6 - 20 mg/dL Final   Creatinine, Ser 05/17/2024 0.70  0.44 - 1.00 mg/dL Final   Glucose, Bld 90/79/7974 120 (H)  70 - 99 mg/dL Final   Glucose reference range applies only to samples taken after fasting for at least 8 hours.   Calcium, Ion 05/17/2024 1.13 (L)  1.15 - 1.40 mmol/L Final   TCO2 05/17/2024 28  22 - 32 mmol/L Final   Hemoglobin 05/17/2024 11.6 (L)  12.0 - 15.0 g/dL Final   HCT 90/79/7974 34.0 (L)  36.0 - 46.0 % Final  Admission on 03/25/2024, Discharged on 03/25/2024  Component Date Value Ref Range Status   Sodium 03/25/2024 140  135 - 145 mmol/L Final   Potassium 03/25/2024 3.2 (L)  3.5 - 5.1 mmol/L Final   Chloride 03/25/2024 105  98 - 111 mmol/L Final   BUN 03/25/2024 9  6 - 20 mg/dL Final   Creatinine, Ser 03/25/2024 0.70  0.44 - 1.00 mg/dL Final   Glucose, Bld 92/70/7974 132 (H)  70 - 99 mg/dL Final   Glucose reference range applies only to samples taken after fasting for at least 8 hours.   Calcium, Ion 03/25/2024 0.99 (L)  1.15 - 1.40 mmol/L Final   TCO2 03/25/2024 24  22 - 32 mmol/L Final   Hemoglobin 03/25/2024 12.9  12.0 - 15.0 g/dL Final   HCT 92/70/7974 38.0  36.0 - 46.0 % Final   Lactic Acid, Venous 03/25/2024 3.2 (HH)  0.5 - 1.9 mmol/L Final   Comment 03/25/2024 NOTIFIED PHYSICIAN   Final  Admission on 02/10/2024, Discharged on 02/12/2024  Component Date Value Ref Range Status   WBC 02/10/2024 16.8 (H)  4.0 - 10.5 K/uL Final   RBC 02/10/2024 4.46  3.87 - 5.11 MIL/uL Final   Hemoglobin 02/10/2024 12.7  12.0 - 15.0 g/dL Final   HCT 93/84/7974  39.5  36.0 - 46.0 % Final   MCV 02/10/2024 88.6  80.0 - 100.0 fL Final    MCH 02/10/2024 28.5  26.0 - 34.0 pg Final   MCHC 02/10/2024 32.2  30.0 - 36.0 g/dL Final   RDW 93/84/7974 14.8  11.5 - 15.5 % Final   Platelets 02/10/2024 331  150 - 400 K/uL Final   nRBC 02/10/2024 0.0  0.0 - 0.2 % Final   Neutrophils Relative % 02/10/2024 83  % Final   Neutro Abs 02/10/2024 14.0 (H)  1.7 - 7.7 K/uL Final   Lymphocytes Relative 02/10/2024 9  % Final   Lymphs Abs 02/10/2024 1.6  0.7 - 4.0 K/uL Final   Monocytes Relative 02/10/2024 6  % Final   Monocytes Absolute 02/10/2024 1.0  0.1 - 1.0 K/uL Final   Eosinophils Relative 02/10/2024 1  % Final   Eosinophils Absolute 02/10/2024 0.1  0.0 - 0.5 K/uL Final   Basophils Relative 02/10/2024 0  % Final   Basophils Absolute 02/10/2024 0.1  0.0 - 0.1 K/uL Final   Immature Granulocytes 02/10/2024 1  % Final   Abs Immature Granulocytes 02/10/2024 0.08 (H)  0.00 - 0.07 K/uL Final   Performed at [facility], [address] [place], [place] [address]   Sodium 02/10/2024 136  135 - 145 mmol/L Final   Potassium 02/10/2024 3.9  3.5 - 5.1 mmol/L Final   Chloride 02/10/2024 101  98 - 111 mmol/L Final   CO2 02/10/2024 25  22 - 32 mmol/L Final   Glucose, Bld 02/10/2024 94  70 - 99 mg/dL Final   Glucose reference range applies only to samples taken after fasting for at least 8 hours.   BUN 02/10/2024 7  6 - 20 mg/dL Final   Creatinine, Ser 02/10/2024 0.66  0.44 - 1.00 mg/dL Final   Calcium 93/84/7974 8.7 (L)  8.9 - 10.3 mg/dL Final   Total Protein 93/84/7974 6.8  6.5 - 8.1 g/dL Final   Albumin 93/84/7974 3.2 (L)  3.5 - 5.0 g/dL Final   AST 93/84/7974 215 (H)  15 - 41 U/L Final   ALT 02/10/2024 143 (H)  0 - 44 U/L Final   Alkaline Phosphatase 02/10/2024 82  38 - 126 U/L Final   Total Bilirubin 02/10/2024 1.0  0.0 - 1.2 mg/dL Final   GFR, Estimated 02/10/2024 >60  >60 mL/min Final   Comment: (NOTE) Calculated using the CKD-EPI Creatinine Equation (2021)    Anion gap 02/10/2024 10  5 - 15 Final   Performed at [facility], [address], [place], [place] [address]   Lipase 02/10/2024 25  11 - 51 U/L Final   Performed at [facility], [address], [place], [place] [address]   Specimen Source 02/10/2024 URINE, CLEAN CATCH   Final   Color, Urine 02/10/2024 YELLOW  YELLOW Final   APPearance 02/10/2024 CLOUDY (A)  CLEAR Final   Specific Gravity, Urine 02/10/2024 1.015  1.005 - 1.030 Final   pH 02/10/2024 7.0  5.0 - 8.0 Final   Glucose, UA 02/10/2024 NEGATIVE  NEGATIVE mg/dL Final   Hgb urine dipstick 02/10/2024 NEGATIVE  NEGATIVE Final   Bilirubin Urine 02/10/2024 NEGATIVE  NEGATIVE Final   Ketones, ur 02/10/2024 NEGATIVE  NEGATIVE mg/dL Final   Protein, ur 93/84/7974 30 (A)  NEGATIVE mg/dL Final   Nitrite 93/84/7974 POSITIVE (A)  NEGATIVE Final   Leukocytes,Ua 02/10/2024 MODERATE (A)  NEGATIVE Final   RBC / HPF 02/10/2024 0-5  0 - 5 RBC/hpf Final   WBC, UA 02/10/2024 21-50  0 - 5 WBC/hpf Final   Comment:        Reflex urine culture not performed if WBC <=10, OR if Squamous epithelial cells >5. If Squamous epithelial cells >5 suggest recollection.    Bacteria, UA 02/10/2024 FEW (A)  NONE SEEN Final   Squamous Epithelial / HPF 02/10/2024 0-5  0 - 5 /HPF Final   Mucus 02/10/2024 PRESENT   Final   Amorphous Crystal 02/10/2024 PRESENT   Final   Performed at [facility], [address] [place], [place] [address]   Preg Test, Ur 02/10/2024 NEGATIVE  NEGATIVE Final   Comment:        THE SENSITIVITY OF THIS METHODOLOGY IS >25 mIU/mL. Performed at [facility], [address], [place], [place] [address]    Specimen Description 02/10/2024 BLOOD RIGHT FOREARM   Final   Special Requests 02/10/2024 BOTTLES DRAWN AEROBIC AND ANAEROBIC Blood Culture adequate volume   Final   Culture 02/10/2024    Final                   Value:NO GROWTH 5 DAYS Performed at [facility], [address] [place], [place] [address]    Report Status 02/10/2024 02/15/2024 FINAL   Final   Specimen  Description 02/10/2024 BLOOD LEFT ANTECUBITAL   Final   Special Requests 02/10/2024 BOTTLES DRAWN AEROBIC AND ANAEROBIC Blood Culture results may not be optimal due to an inadequate volume of blood received in culture bottles   Final   Culture 02/10/2024    Final                   Value:NO GROWTH 5 DAYS Performed at [facility], [address], [place], [place] [address]    Report Status 02/10/2024 02/15/2024 FINAL   Final   Neisseria Gonorrhea 02/10/2024 Positive (A)   Final   Chlamydia 02/10/2024 Negative   Final   Comment 02/10/2024 Normal Reference Ranger Chlamydia - Negative   Final   Comment 02/10/2024 Normal Reference Range Neisseria Gonorrhea - Negative   Final   Yeast Wet Prep HPF POC 02/10/2024 NONE SEEN  NONE SEEN Final   Trich, Wet Prep 02/10/2024 NONE SEEN  NONE SEEN Final   Clue Cells Wet Prep HPF POC 02/10/2024 NONE SEEN  NONE SEEN Final   WBC, Wet Prep HPF POC 02/10/2024 >=10 (A)  <10 Final   Sperm 02/10/2024 NONE SEEN   Final   Performed at [facility], [address] [place], [place] [address]   HIV Screen 4th Generation wRfx 02/10/2024 Non Reactive  Non Reactive Final   Performed at [facility], [address], [place], [place] [address]   RPR Ser Ql 02/10/2024 NON REACTIVE  NON REACTIVE Final   Performed at [facility], [address] [place], [place] [address]   Hepatitis B Surface Ag 02/10/2024 NON REACTIVE  NON REACTIVE Final   HCV Ab 02/10/2024 Reactive (A)  NON REACTIVE Final   Comment: (NOTE) The CDC recommends that a Reactive HCV antibody result be followed up  with a HCV Nucleic Acid Amplification test.     Hep A IgM 02/10/2024 NON REACTIVE  NON REACTIVE Final   Hep B C IgM 02/10/2024 NON REACTIVE  NON REACTIVE Final   Performed at [facility], [address], [place], [place] [address]   Specimen Description  02/10/2024 URINE, RANDOM   Final   Special Requests 02/10/2024    Final                   Value:NONE Reflexed  from [id number] Performed at [facility], [address] [place], [place] [address]    Culture 02/10/2024 MULTIPLE SPECIES PRESENT, SUGGEST RECOLLECTION (A)   Final   Report Status 02/10/2024 02/11/2024 FINAL   Final   WBC 02/11/2024 17.9 (H)  4.0 - 10.5 K/uL Final   RBC 02/11/2024 4.32  3.87 - 5.11 MIL/uL Final   Hemoglobin 02/11/2024 12.4  12.0 - 15.0 g/dL Final   HCT 93/83/7974 38.1  36.0 - 46.0 % Final   MCV 02/11/2024 88.2  80.0 - 100.0 fL Final   MCH 02/11/2024 28.7  26.0 - 34.0 pg Final   MCHC 02/11/2024 32.5  30.0 - 36.0 g/dL Final   RDW 93/83/7974 14.9  11.5 - 15.5 % Final   Platelets 02/11/2024 328  150 - 400 K/uL Final   nRBC 02/11/2024 0.0  0.0 - 0.2 % Final   Performed at [facility], [address] [place], [place] [address]   Sodium 02/11/2024 134 (L)  135 - 145 mmol/L Final   Potassium 02/11/2024 4.1  3.5 - 5.1 mmol/L Final   Chloride 02/11/2024 103  98 - 111 mmol/L Final   CO2 02/11/2024 23  22 - 32 mmol/L Final   Glucose, Bld 02/11/2024 245 (H)  70 - 99 mg/dL Final   Glucose reference range applies only to samples taken after fasting for at least 8 hours.   BUN 02/11/2024 9  6 - 20 mg/dL Final   Creatinine, Ser 02/11/2024 0.76  0.44 - 1.00 mg/dL Final   Calcium 93/83/7974 7.9 (L)  8.9 - 10.3 mg/dL Final   Total Protein 93/83/7974 5.7 (L)  6.5 - 8.1 g/dL Final   Albumin 93/83/7974 2.3 (L)  3.5 - 5.0 g/dL Final   AST 93/83/7974 109 (H)  15 - 41 U/L Final   ALT 02/11/2024 95 (H)  0 - 44 U/L Final   Alkaline Phosphatase 02/11/2024 72  38 - 126 U/L Final   Total Bilirubin 02/11/2024 0.7  0.0 - 1.2 mg/dL Final   GFR, Estimated 02/11/2024 >60  >60 mL/min Final   Comment: (NOTE) Calculated using the CKD-EPI Creatinine Equation (2021)    Anion gap 02/11/2024 8  5 - 15 Final   Performed at [facility], [address], [place], [place] [address]   HCV RNA Qnt(log copy/mL) 02/11/2024 6.778  log10 IU/mL Corrected   HepC Qn 02/11/2024 6,000,000  IU/mL  Final   Test Information 02/11/2024 Comment   Final   Comment: (NOTE) The quantitative range of this assay is 15 IU/mL to 100 million IU/mL.    Hcv Genotype 02/11/2024 Comment   Final   Comment: (NOTE) To be performed on this specimen. Performed At: [facility] [address] [place], [place] [id number] Jennette Shorter MD Ey:[id number]    aPTT 02/11/2024 37 (H)  24 - 36 seconds Final   Comment:        IF BASELINE aPTT IS ELEVATED, SUGGEST PATIENT RISK ASSESSMENT BE USED TO DETERMINE APPROPRIATE ANTICOAGULANT THERAPY. Performed at [facility], [address] [place], [place] [address]    Prothrombin Time 02/11/2024 15.4 (H)  11.4 - 15.2 seconds Final   INR 02/11/2024 1.2  0.8 - 1.2 Final   Comment: (NOTE) INR goal varies based on  device and disease states. Performed at [facility], [address] [place], [place] [address]    Fibrinogen  02/11/2024 524 (H)  210 - 475 mg/dL Final   Comment: (NOTE) Fibrinogen  results may be underestimated in patients receiving thrombolytic therapy. Performed at [facility], [address] [place], [place] [address]    TSH 02/11/2024 0.373  0.350 - 4.500 uIU/mL Final   Comment: Performed by a 3rd Generation assay with a functional sensitivity of <=0.01 uIU/mL. Performed at [facility], [address] [place], [place] [address]    WBC 02/12/2024 9.0  4.0 - 10.5 K/uL Final   RBC 02/12/2024 4.32  3.87 - 5.11 MIL/uL Final   Hemoglobin 02/12/2024 12.6  12.0 - 15.0 g/dL Final   HCT 93/82/7974 38.4  36.0 - 46.0 % Final   MCV 02/12/2024 88.9  80.0 - 100.0 fL Final   MCH 02/12/2024 29.2  26.0 - 34.0 pg Final   MCHC 02/12/2024 32.8  30.0 - 36.0 g/dL Final   RDW 93/82/7974 14.9  11.5 - 15.5 % Final   Platelets 02/12/2024 340  150 - 400 K/uL Final   nRBC 02/12/2024 0.0  0.0 - 0.2 % Final   Performed at [facility] Lab, [address] [place], [place] [address]   Sodium 02/12/2024 138  135 - 145 mmol/L Final    Potassium 02/12/2024 3.9  3.5 - 5.1 mmol/L Final   Chloride 02/12/2024 106  98 - 111 mmol/L Final   CO2 02/12/2024 22  22 - 32 mmol/L Final   Glucose, Bld 02/12/2024 147 (H)  70 - 99 mg/dL Final   Glucose reference range applies only to samples taken after fasting for at least 8 hours.   BUN 02/12/2024 10  6 - 20 mg/dL Final   Creatinine, Ser 02/12/2024 0.66  0.44 - 1.00 mg/dL Final   Calcium 93/82/7974 8.2 (L)  8.9 - 10.3 mg/dL Final   Total Protein 93/82/7974 5.9 (L)  6.5 - 8.1 g/dL Final   Albumin 93/82/7974 2.2 (L)  3.5 - 5.0 g/dL Final   AST 93/82/7974 133 (H)  15 - 41 U/L Final   ALT 02/12/2024 93 (H)  0 - 44 U/L Final   Alkaline Phosphatase 02/12/2024 63  38 - 126 U/L Final   Total Bilirubin 02/12/2024 0.4  0.0 - 1.2 mg/dL Final   GFR, Estimated 02/12/2024 >60  >60 mL/min Final   Comment: (NOTE) Calculated using the CKD-EPI Creatinine Equation (2021)    Anion gap 02/12/2024 10  5 - 15 Final   Performed at [facility], [address] [place], [place] [address]   Hgb A1c MFr Bld 02/12/2024 5.4  4.8 - 5.6 % Final   Comment: (NOTE) Diagnosis of Diabetes The following HbA1c ranges recommended by the American Diabetes Association (ADA) may be used as an aid in the diagnosis of diabetes mellitus.  Hemoglobin             Suggested A1C NGSP%              Diagnosis  <5.7                   Non Diabetic  5.7-6.4                Pre-Diabetic  >6.4                   Diabetic  <7.0  Glycemic control for                       adults with diabetes.     Mean Plasma Glucose 02/12/2024 108.28  mg/dL Final   Performed at [facility], [address], [place], [place] [address]   Hepatitis C Genotype 02/11/2024 1a   Final   Comment: (NOTE) This test was developed and its performance characteristics determined by [facility]. It has not been cleared or approved by the Food and Drug Administration. Performed At: [facility] [address]  [place], [place] [id number] Jennette Shorter MD Ey:[id number]     Blood Alcohol level:  Lab Results  Component Value Date   [facility] <15 07/08/2024    Metabolic Disorder Labs: Lab Results  Component Value Date   HGBA1C 5.5 07/08/2024   MPG 111.15 07/08/2024   MPG 108.28 02/12/2024   No results found for: PROLACTIN Lab Results  Component Value Date   CHOL 174 07/08/2024   TRIG 102 07/08/2024   HDL 42 07/08/2024   CHOLHDL 4.1 07/08/2024   VLDL 20 07/08/2024   LDLCALC 112 (H) 07/08/2024   LDLCALC 73 09/22/2020    Therapeutic Lab Levels: No results found for: LITHIUM No results found for: VALPROATE No results found for: CBMZ  Physical Findings   GAD-7    Flowsheet Row Procedure visit from 11/01/2020 in [facility] Office Visit from 09/22/2020 in [facility] at [facility] Clinical Support from 06/21/2020 in [facility] [employer] at [facility] Clinical Support from 04/05/2020 in [facility] for [employer] at [facility] Clinical Support from 01/19/2020 in [facility]  Total GAD-7 Score 7 2 10 8  0   PHQ2-9    Flowsheet Row ED from 07/28/2024 in [facility] ED from 07/10/2024 in [facility] ED from 07/08/2024 in [facility] Procedure visit from 11/01/2020 in [facility] at [facility] Office Visit from 09/22/2020 in [facility]  PHQ-2 Total Score 6 2 4  0 0  PHQ-9 Total Score 19 7 12  0 2   Flowsheet Row ED from 07/28/2024 in [facility] ED from 07/10/2024 in [facility] ED from 07/08/2024 in [facility]  C-SSRS RISK CATEGORY No Risk No  Risk No Risk     Musculoskeletal  Strength & Muscle Tone: within normal limits Gait & Station: normal Patient leans: N/A  Psychiatric Specialty Exam  Presentation  General Appearance:  Disheveled  Eye Contact: Poor  Speech: Clear and Coherent  Speech Volume: Normal  Handedness: Right   Mood and Affect  Mood: Irritable  Affect: Congruent   Thought Process  Thought Processes: Coherent  Descriptions of Associations:Intact  Orientation:Full (Time, Place and Person)  Thought Content:WDL  Diagnosis of Schizophrenia or Schizoaffective disorder in past: No    Hallucinations:Hallucinations: None  Ideas of Reference:None  Suicidal Thoughts:Suicidal Thoughts: No  Homicidal Thoughts:Homicidal Thoughts: No   Sensorium  Memory: Immediate Fair  Judgment: Poor  Insight: Poor   Executive Functions  Concentration: Fair  Attention Span: Fair  Recall: [employer] of Knowledge: Fair  Language: Fair   Psychomotor Activity  Psychomotor Activity: Psychomotor Activity: Normal  Assets  Assets: Manufacturing Systems Engineer; Desire for Improvement; Intimacy   Sleep  Sleep: Sleep: Fair  Estimated Sleeping Duration (Last 24 Hours): 10.75-12.25 hours  No data recorded  Physical Exam  Physical Exam Vitals and nursing note reviewed.  HENT:     Nose: Nose normal.  Cardiovascular:     Rate and Rhythm: Normal rate.  Pulmonary:     Effort: Pulmonary effort is normal.  Musculoskeletal:        General: Normal range of motion.  Neurological:     Mental Status: She is alert and oriented to person, place, and time.  Psychiatric:        Attention and Perception: Attention normal. She does not perceive auditory or visual hallucinations.        Mood and Affect: Mood and affect normal.        Speech: Speech normal.        Behavior: Behavior normal.        Thought Content: Thought content normal. Thought content is not paranoid. Thought content does not  include homicidal or suicidal ideation. Thought content does not include homicidal or suicidal plan.        Judgment: Judgment normal.    Review of Systems  Constitutional: Negative.   HENT: Negative.    Eyes: Negative.   Respiratory: Negative.    Cardiovascular: Negative.   Gastrointestinal: Negative.   Genitourinary: Negative.   Musculoskeletal: Negative.   Skin: Negative.   Neurological: Negative.   Endo/Heme/Allergies: Negative.   Psychiatric/Behavioral:  Positive for substance abuse. Negative for depression, hallucinations and suicidal ideas. The patient is nervous/anxious. The patient does not have insomnia.    Blood pressure 129/88, pulse 73, temperature 97.7 F (36.5 C), temperature source Oral, resp. rate 16, weight 147 lb (66.7 kg), SpO2 98%. Body mass index is 23.02 kg/m.  Treatment Plan Summary: Daily contact with patient to assess and evaluate symptoms and progress in treatment and Medication management  Continue opioid detox treatment in facility based crisis unit.   Continue current medication regimen of:    Clonidine  Detox protocol: -Clonidine  0.1 mg 4 times daily by mouth x10 doses, clonidine  0.1 mg by mouth every morning and nightly x4 doses, clonidine  0.1 mg by mouth daily before breakfast x2 doses   -maalox/mylanta 30 mL by mouth every 4 hours PRN for indigestion -hydroxyzine  (atarax ) tablet 25 mg by mouth every 6 hours PRN for anxiety -Dicyclomine  20 mg by mouth every 6 hours as needed for muscle spasms or abdominal cramping -feeding supplement (ENSURE) 237 mL by mouth bid between meals -Loperamide  2 to 4 mg by mouth as needed for diarrhea or loose stools -Methocarbamol  500 mg every 8 hours as needed for muscle spasms -Naproxen  500 mg twice daily as needed for aching, pain or discomfort -Ondansetron  disintegrating tablet 4mg  every 6 hours as needed/nausea or vomiting -milk of magnesia 30 mL by mouth daily as needed for mild constipation -nicotine  patch 21  mg transdermal over 24 hours daily  -trazodone  tablet 50 mg by mouth at bedtime PRN for sleep   Agitation protocol initiated: Mild agitation: -Haloperidol  5 mg oral 3 times daily as needed -Diphenhydramine  50 mg oral 3 times daily as needed   Moderate agitation: -Haloperidol  lactate 5 mg IM 3 times daily as needed -Diphenhydramine  injection 50 mg IM 3 times daily as needed -Lorazepam  2 mg IM 3 times daily as needed   Severe agitation: -Haloperidol  lactate 10 mg IM 3 times daily as needed -Diphenhydramine  injection 50 mg IM 3  times daily as needed -Lorazepam  2 mg IM 3 times daily as needed    Patient is currently interested in transitioning to residential substance abuse treatment at [facility] upon discharge from [facility]. Per child psychotherapist, she has been accepted into [facility] on 08/06/2024 and the social worker has been in contact with her Peer Support Specialist to help with finding her a shelter until then.   Mayah Urquidi, NP 07/30/2024 1:40 PM

## 2024-07-30 NOTE — Group Note (Signed)
 Group Topic: Relapse and Recovery  Group Date: 07/30/2024 Start Time: 1100 End Time: 1200 Facilitators: Eimy Plaza, LCSW  Department: [facility]  Number of Participants: 4  Group Focus: self-awareness, self-esteem, and substance abuse education Treatment Modality:  Cognitive Behavioral Therapy Interventions utilized were patient education and problem solving Purpose: enhance coping skills, express feelings, express irrational fears, improve communication skills, relapse prevention strategies, and trigger / craving management  Writer explored with each client triggers and coping skills they would like to incorporate moving forward.  Writer explored past substance use and what brought them to [facility].  Writer utilized CBT and MI to explore future goals and progress.  Name: Morgan Donaldson Date of Birth: 09-Dec-1980  MR: [id number]    Level of Participation: active Quality of Participation: cooperative and engaged Interactions with others: gave feedback Mood/Affect: appropriate and brightens with interaction Triggers (if applicable): pepople and environment.  Cognition: goal directed, insightful, and logical Progress: Gaining insight Response: Client will attend residential treatment upon discharge and or remain clean until she can enter a new facility  Plan: follow-up as needed  Patients Problems:  Patient Active Problem List   Diagnosis Date Noted   Hyperthyroidism 07/16/2024   Opioid dependence with uncomplicated intoxication (HCC) 07/14/2024   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   Elevated liver function tests 07/14/2024   Opioid use with withdrawal (HCC) 07/14/2024   Polysubstance abuse (HCC) 07/10/2024   Pyosalpingitis 02/12/2024   Hepatitis C antibody detected 02/11/2024   PID (acute pelvic inflammatory disease) 02/10/2024   Gonorrhea 12/10/2023   Group A streptococcal infection 12/09/2023   IV drug abuse (HCC) 12/09/2023   Acute  cystitis 12/09/2023   GAD (generalized anxiety disorder) 06/21/2020   Attention deficit hyperactivity disorder (ADHD), 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

## 2024-07-30 NOTE — Care Management (Signed)
 [facility] Care Management:  Writer met with the client to inform her she's been accepted into [facility] on 08/06/24.  Writer has been in contact with Peer Support Specialist to help assist with finding a shelter until 08/16/24.    Writer will also explore shelter options on the client's behalf.

## 2024-07-30 NOTE — Care Management (Signed)
 [facility] Care management..  Client previously had not called [facility] due to her not wanting to go there.  Client completed a phone interview but they declined her due to her insurance.  Client has been accepted into [facility].  Peer Support specialist has confirmed a bed for Friday at a shelter that she can stay at until her appointment on 08/06/24.

## 2024-07-30 NOTE — Care Management (Addendum)
 [facility] Care Management...   ADDENDUM 2:27  Patient can discharge Friday 08/02/24  Writer spoke with Harlene (PSS)  Writer and Harlene was able to secure placement at [facility] for Friday 08/02/2024 Patient will be able to reside at [facility] until her appointment on Wednesday 08/06/2024 @ 9:00 am  Harlene will transport patient to [facility] appointment  Writer met with patient to discuss discharge planning..  Patient is scheduled to discharge tomorrow 07/31/2024  Patient reported that possibly she could stay with [facility] [phone].  Writer contacted Dick to verify.  Per Dick, he is not in the position to allow patient to stay at his place   Patient reported possibly contacting Rosaline Berlin will obtain number from patients belongings

## 2024-07-30 NOTE — ED Notes (Signed)
 Pt sleeping in bed, no acute distress noted. Respirations even and unlabored. Continue to monitor for safety.

## 2024-07-31 DIAGNOSIS — F1123 Opioid dependence with withdrawal: Secondary | ICD-10-CM | POA: Diagnosis not present

## 2024-07-31 DIAGNOSIS — F1410 Cocaine abuse, uncomplicated: Secondary | ICD-10-CM | POA: Diagnosis not present

## 2024-07-31 DIAGNOSIS — F331 Major depressive disorder, recurrent, moderate: Secondary | ICD-10-CM | POA: Diagnosis not present

## 2024-07-31 DIAGNOSIS — F1124 Opioid dependence with opioid-induced mood disorder: Secondary | ICD-10-CM | POA: Diagnosis not present

## 2024-07-31 LAB — HEPATIC FUNCTION PANEL
ALT: 235 U/L — ABNORMAL HIGH (ref 0–44)
AST: 140 U/L — ABNORMAL HIGH (ref 15–41)
Albumin: 3.2 g/dL — ABNORMAL LOW (ref 3.5–5.0)
Alkaline Phosphatase: 73 U/L (ref 38–126)
Bilirubin, Direct: <0.1 mg/dL (ref 0.0–0.2)
Total Bilirubin: 0.5 mg/dL (ref 0.0–1.2)
Total Protein: 6.7 g/dL (ref 6.5–8.1)

## 2024-07-31 MED ORDER — MELATONIN 3 MG PO TABS: 3.0000 mg | ORAL_TABLET | Freq: Once | ORAL | Status: DC

## 2024-07-31 MED ORDER — TRAZODONE HCL 100 MG PO TABS
100.0000 mg | ORAL_TABLET | Freq: Every evening | ORAL | Status: DC | PRN
  Administered 2024-07-31: 100 mg via ORAL
  Filled 2024-07-31: qty 7
  Filled 2024-07-31: qty 1

## 2024-07-31 MED ORDER — BUSPIRONE HCL 10 MG PO TABS
20.0000 mg | ORAL_TABLET | Freq: Two times a day (BID) | ORAL | Status: DC
  Administered 2024-07-31 – 2024-08-01 (×3): 20 mg via ORAL
  Filled 2024-07-31 (×2): qty 2
  Filled 2024-07-31: qty 28
  Filled 2024-07-31: qty 2

## 2024-07-31 NOTE — Group Note (Signed)
 Group Topic: Healthy Self Image and Positive Change  Group Date: 07/31/2024 Start Time: 1300 End Time: 1350 Facilitators: Gerome Jolly, NT  Department: [facility]  Number of Participants: 4  Group Focus: acceptance, chemical dependency education, chemical dependency issues, and other intrusive thoughts Treatment Modality:  Behavior Modification Therapy, Cognitive Behavioral Therapy, and Spiritual Interventions utilized were exploration and problem solving Purpose: explore maladaptive thinking, improve communication skills, regain self-worth, relapse prevention strategies, and trigger / craving management  Name: Morgan Donaldson Date of Birth: 1981-06-23  MR: [id number]    Level of Participation: active Quality of Participation: attentive, cooperative, and engaged Interactions with others: gave feedback Mood/Affect: appropriate and positive Triggers (if applicable): na Cognition: fearful and goal directed Progress: Gaining insight Response: Patient was able to identify her irrational thought processes as related to her substance use and develop a plan for to combat irrational behaviors and thinking   Plan: referral / recommendations patient has been referred for inpatient treatment at [facility], appointment scheduled for 08/06/2024 @ 9:00 am    Patients Problems:  Patient Active Problem List   Diagnosis Date Noted   Hyperthyroidism 07/16/2024   Opioid dependence with uncomplicated intoxication (HCC) 07/14/2024   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   Elevated liver function tests 07/14/2024   Opioid use with withdrawal (HCC) 07/14/2024   Polysubstance abuse (HCC) 07/10/2024   Pyosalpingitis 02/12/2024   Hepatitis C antibody detected 02/11/2024   PID (acute pelvic inflammatory disease) 02/10/2024   Gonorrhea 12/10/2023   Group A streptococcal infection 12/09/2023   IV drug abuse (HCC) 12/09/2023   Acute cystitis 12/09/2023   GAD  (generalized anxiety disorder) 06/21/2020   Attention deficit hyperactivity disorder (ADHD), 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

## 2024-07-31 NOTE — ED Notes (Signed)
 Patient resting in room with eyes closed. Breathing unlabored with even rise and fall of chest. No s/s of current distress.

## 2024-07-31 NOTE — Discharge Instructions (Addendum)
 [facility] Care Management...  Patient requested in patient treatment  Client was informed she's been accepted into [facility] shelter starting on 08/01/24.    Peer Support Specialist will transport to the [facility] .     Client will remain at [facility] until her appointment with [facility] on 08/06/24 @ 9:00 am.  [facility] [address] [place], [place] [address] Phone: [phone]  7 day samples and 30d Rx   Activity: Daily physical activity; reduce sedentary behaviors   Diet: Portion-limited diet rich in produce, whole (minimally processed) grains, nuts/seeds, eggs, beans/legumes, seafood, lowfat dairy (if tolerated), fermented food, lean meat. Copious water intake. Limit (ultra)processed foods and sugar-sweetened beverages.    Other: -Follow-up with your outpatient psychiatric provider -instructions on appointment date, time, and address (location) are provided to you in discharge paperwork. Please keep intake appointment with [facility] 08/06/24.   -Take your psychiatric medications as prescribed at discharge - instructions are provided to you in the discharge paperwork. Scheduled buspirone . Use trazodone  and hydroxyzine  as needed.   -Follow-up with outpatient primary care doctor to optimize health maintenance/preventative care and other specialists -for management of chronic medical disease. Please establish with primary care, gastroenterology (GI),  infectious disease for treatment of Hepatitis C.   -Testing: Follow-up with outpatient provider for abnormal lab results: elevated liver enzymes  -Recommend total abstinence from alcohol, tobacco, and other illicit drug use at discharge.    -If your psychiatric symptoms recur, worsen, or if you have side effects to your psychiatric medications, call your outpatient psychiatric provider, [phone] or go to the nearest emergency department.   -If suicidal thoughts occur, immediately call your outpatient  psychiatric provider, 911, [phone] or go to the nearest emergency department.

## 2024-07-31 NOTE — ED Notes (Signed)
 Pt sleeping in bed, no acute distress noted. Respirations even and unlabored. Continue to monitor for safety.

## 2024-07-31 NOTE — Group Note (Signed)
 Group Topic: Wellness  Group Date: 07/31/2024 Start Time: 2030 End Time: 2100 Facilitators: Anice Benton LABOR, NT  Department: [facility]  Number of Participants: 6  Group Focus: check in and clarity of thought Treatment Modality:  Individual Therapy Interventions utilized were support Purpose: express feelings  Name: Morgan Donaldson Date of Birth: [birth year]  MR: [id number]    Level of Participation: active Quality of Participation: cooperative Interactions with others: gave feedback Mood/Affect: appropriate Triggers (if applicable): N/A Cognition: coherent/clear Progress: Minimum  Response: good Plan: follow-up needed  Patients Problems:  Patient Active Problem List   Diagnosis Date Noted   Hyperthyroidism 07/16/2024   Opioid dependence with uncomplicated intoxication (HCC) 07/14/2024   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   Elevated liver function tests 07/14/2024   Opioid use with withdrawal (HCC) 07/14/2024   Polysubstance abuse (HCC) 07/10/2024   Pyosalpingitis 02/12/2024   Hepatitis C antibody detected 02/11/2024   PID (acute pelvic inflammatory disease) 02/10/2024   Gonorrhea 12/10/2023   Group A streptococcal infection 12/09/2023   IV drug abuse (HCC) 12/09/2023   Acute cystitis 12/09/2023   GAD (generalized anxiety disorder) 06/21/2020   Attention deficit hyperactivity disorder (ADHD), 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

## 2024-07-31 NOTE — Group Note (Signed)
 Group Topic: Wellness  Group Date: 07/31/2024 Start Time: 0800 End Time: 0830 Facilitators: Byrd, Vick Filter, RN  Department: [facility]  Number of Participants: 4  Group Focus: check in, nursing group, and psychiatric education Treatment Modality:  Psychoeducation Interventions utilized were clarification, patient education, and support Purpose: express feelings, improve communication skills, and increase insight  Name: Morgan Donaldson Date of Birth: [birth year]  MR: [id number]    Level of Participation: active Quality of Participation: attentive and cooperative Interactions with others: gave feedback Mood/Affect: appropriate Triggers (if applicable):  Cognition: coherent/clear Progress: Gaining insight Response: appropriate Plan: patient will be encouraged to attend more groups.   Patients Problems:  Patient Active Problem List   Diagnosis Date Noted   Hyperthyroidism 07/16/2024   Opioid dependence with uncomplicated intoxication (HCC) 07/14/2024   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   Elevated liver function tests 07/14/2024   Opioid use with withdrawal (HCC) 07/14/2024   Polysubstance abuse (HCC) 07/10/2024   Pyosalpingitis 02/12/2024   Hepatitis C antibody detected 02/11/2024   PID (acute pelvic inflammatory disease) 02/10/2024   Gonorrhea 12/10/2023   Group A streptococcal infection 12/09/2023   IV drug abuse (HCC) 12/09/2023   Acute cystitis 12/09/2023   GAD (generalized anxiety disorder) 06/21/2020   Attention deficit hyperactivity disorder (ADHD), 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

## 2024-07-31 NOTE — Group Note (Unsigned)
 Group Topic: Fears and Unhealthy Coping Skills  Group Date: 07/31/2024 Start Time: 1000 End Time: 1045 Facilitators: Lonzell Dwayne RAMAN, NT  Department: [facility]  Number of Participants: 2  Group Focus: depression and substance abuse education Treatment Modality:  Behavior Modification Therapy and Spiritual Interventions utilized were mental fitness and patient education Purpose: explore maladaptive thinking, regain self-worth, and relapse prevention strategies   Name: Morgan Donaldson Date of Birth: 31-Aug-1980  MR: [id number]    Level of Participation: {THERAPIES; PSYCH GROUP PARTICIPATION OZCZO:76008} Quality of Participation: {THERAPIES; PSYCH QUALITY OF PARTICIPATION:23992} Interactions with others: {THERAPIES; PSYCH INTERACTIONS:23993} Mood/Affect: {THERAPIES; PSYCH MOOD/AFFECT:23994} Triggers (if applicable): *** Cognition: {THERAPIES; PSYCH COGNITION:23995} Progress: {THERAPIES; PSYCH PROGRESS:23997} Response: *** Plan: {THERAPIES; PSYCH EOJW:76003}  Patients Problems:  Patient Active Problem List   Diagnosis Date Noted   Hyperthyroidism 07/16/2024   Opioid dependence with uncomplicated intoxication (HCC) 07/14/2024   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   Elevated liver function tests 07/14/2024   Opioid use with withdrawal (HCC) 07/14/2024   Polysubstance abuse (HCC) 07/10/2024   Pyosalpingitis 02/12/2024   Hepatitis C antibody detected 02/11/2024   PID (acute pelvic inflammatory disease) 02/10/2024   Gonorrhea 12/10/2023   Group A streptococcal infection 12/09/2023   IV drug abuse (HCC) 12/09/2023   Acute cystitis 12/09/2023   GAD (generalized anxiety disorder) 06/21/2020   Attention deficit hyperactivity disorder (ADHD), combined type 06/21/2020   Breakthrough bleeding on depo provera  05/12/2020   Pap smear abnormality of cervix/human papillomavirus (HPV) positive 08/17/2019   Hemorrhagic cyst of left ovary 08/11/2019    Tobacco abuse 05/19/2019   History of drug abuse in remission (HCC) 05/19/2019

## 2024-07-31 NOTE — ED Notes (Signed)
 Pt denies SI/HI/AVH. Reported pain managed with prn naproxen . Pt compliant with scheduled meds; prn atarax  and trazodone  given. Pt did not attend pm AA group. She was calm, cooperative, needy, and ambivalent. No acute distress noted. Continue to monitor for safety.

## 2024-07-31 NOTE — ED Notes (Signed)
 Patient ate lunch and is med compliant. Patient denies SI,HI,and A/V/H with no plan or intent. Patient cooperative with lab work and remains cooperative in unit. Patient's latest Cow score 1. No s/s of current distress.

## 2024-07-31 NOTE — ED Notes (Signed)
 Patient is currently eating dinner. Patient remains cooperative in unit. Cow score 0. No s/s of current distress.

## 2024-07-31 NOTE — ED Provider Notes (Signed)
 Behavioral Health Progress Note  Date and Time: 07/31/2024 5:18 PM Name: Morgan Donaldson MRN:  [id number]  Subjective: Morgan Donaldson 43 y.o., female patient admitted to [facility] voluntarily with complaints of needing to detox from Fentanyl  after finding out her marijuana was laced with Fentanyl  and methamphetamine. Pt also reports using crack cocaine. Morgan Donaldson, is seen face to face by this provider, consulted with Dr. Cole; and chart reviewed on 07/31/24. Per chart review, pt has a PPHx of polysubstance abuse, GAD, ADHD, drug-induced mood disorder and opioid dependence.  Medical history pertinent for history of hepatitis C, hypothyroidism and HPV.  Patient was discharged from [facility] on 07/21/24 with plans to be admitted [facility] residential treatment program however, patient did not follow-up with discharge plan and ended up relapsing on multiple substances.   On evaluation MYIA BERGH reports that she has not been able to sleep and notices that her mood is all over the place due to her not being able to take her Seroquel  as they have been attempting to monitor her elevated LFTs.  Patient Seroquel  has been held to determine if it is causing the elevation in her liver function tests.  Patient was started on Suboxone  during last admission but did not follow-up with outpatient clinic to continue treatment.  Patient endorses feeling very anxious and restless requesting to restart her BuSpar .  Patient's reports having a good appetite.  She denies suicidal ideations, homicidal ideations and psychotic symptoms.  Patient is aware that plan is for her to discharge with peers support to temporarily housed at [facility] until her bed at [facility] residential is available on 08/06/2024.  Patient states that she does not want to stay at the [facility] and would prefer to remain on the unit until her bed at [facility] is ready.  Patient not displaying current withdrawal symptoms and will be discharging tomorrow as planned to continue  with residential substance abuse treatment.  Patient requesting melatonin to help with sleep.   Most recent COWS score was 0 at 1604.   During evaluation Gwenda L Spurr is sitting up in day room, in no acute distress.  She is alert & oriented x 4, anxious but cooperative and attentive for this assessment. Her mood is anxious with congruent affect. She has normal speech, and restless behavior.  Objectively there is no evidence of psychosis/mania or delusional thinking. Pt does not appear to be responding to internal or external stimuli.  Patient is able to converse coherently, goal directed thoughts, no distractibility, or pre-occupation.  She also denies suicidal/self-harm/homicidal ideation, psychosis, and paranoia.  Patient answered assessment questions appropriately.      Diagnosis:  Final diagnoses:  Polysubstance abuse (HCC)  Fentanyl  use disorder, severe, dependence (HCC)  Marijuana user  IV drug user  Homelessness  Cocaine abuse (HCC)  Elevated liver enzymes    Total Time spent with patient: 15 minutes  Past Psychiatric History: Bipolar disorder, PTSD, and polysubstance use disorder Past Medical History:  -polysubstance abuse -pyosalpingitis -hepatitis C antibody detected (02/11/2024) -acute pelvic inflammatory disease -gonorrhea -group A streptococcal infection -IV drug abuse -acute cystitis -generalized anxiety disorder -ADHD, combined type -breakthrough bleeding on depo provera  -pap smear abnormality of cervix/HPV positive -hemorrhagic cyst of left ovary -tobacco abuse -history of drug abuse in remission  Family History:  Family History  Problem Relation Age of Onset   Lung cancer Mother        died at age 56   CAD Mother  MI at age 38   Hypertension Mother    Schizophrenia Father    Hypertension Sister    Hyperlipidemia Sister    COPD Sister    Healthy Brother    Healthy Sister     Social History: Has been homeless for the past year. Unemployed.  Fentanyl , cocaine, and weed use.    Sleep: Good  Appetite:  Fair  Current Medications:  Current Facility-Administered Medications  Medication Dose Route Frequency Provider Last Rate Last Admin   alum & mag hydroxide-simeth (MAALOX/MYLANTA) 200-200-20 MG/5ML suspension 30 mL  30 mL Oral Q4H PRN Onuoha, Chinwendu V, NP       busPIRone  (BUSPAR ) tablet 20 mg  20 mg Oral BID Luan Maberry C, NP   20 mg at 07/31/24 1113   cloNIDine  (CATAPRES ) tablet 0.1 mg  0.1 mg Oral BH-qamhs Onuoha, Chinwendu V, NP       Followed by   NOREEN ON 08/03/2024] cloNIDine  (CATAPRES ) tablet 0.1 mg  0.1 mg Oral QAC breakfast Onuoha, Chinwendu V, NP       dicyclomine  (BENTYL ) tablet 20 mg  20 mg Oral Q6H PRN Onuoha, Chinwendu V, NP       haloperidol  (HALDOL ) tablet 5 mg  5 mg Oral TID PRN Onuoha, Chinwendu V, NP       And   diphenhydrAMINE  (BENADRYL ) capsule 50 mg  50 mg Oral TID PRN Onuoha, Chinwendu V, NP       haloperidol  lactate (HALDOL ) injection 5 mg  5 mg Intramuscular TID PRN Onuoha, Chinwendu V, NP       And   diphenhydrAMINE  (BENADRYL ) injection 50 mg  50 mg Intramuscular TID PRN Onuoha, Chinwendu V, NP       And   LORazepam  (ATIVAN ) injection 2 mg  2 mg Intramuscular TID PRN Onuoha, Chinwendu V, NP       haloperidol  lactate (HALDOL ) injection 10 mg  10 mg Intramuscular TID PRN Onuoha, Chinwendu V, NP       And   diphenhydrAMINE  (BENADRYL ) injection 50 mg  50 mg Intramuscular TID PRN Onuoha, Chinwendu V, NP       And   LORazepam  (ATIVAN ) injection 2 mg  2 mg Intramuscular TID PRN Onuoha, Chinwendu V, NP       feeding supplement (ENSURE PLUS HIGH PROTEIN) liquid 237 mL  237 mL Oral BID BM Onuoha, Chinwendu V, NP   237 mL at 07/31/24 1503   hydrOXYzine  (ATARAX ) tablet 50 mg  50 mg Oral Q6H PRN Ramzan, Mariam, NP   50 mg at 07/30/24 2203   loperamide  (IMODIUM ) capsule 2-4 mg  2-4 mg Oral PRN Onuoha, Chinwendu V, NP       magnesium  hydroxide (MILK OF MAGNESIA) suspension 30 mL  30 mL Oral Daily PRN Onuoha,  Chinwendu V, NP       methocarbamol  (ROBAXIN ) tablet 500 mg  500 mg Oral Q8H PRN Onuoha, Chinwendu V, NP   500 mg at 07/29/24 1456   naproxen  (NAPROSYN ) tablet 500 mg  500 mg Oral BID PRN Onuoha, Chinwendu V, NP   500 mg at 07/30/24 2204   nicotine  (NICODERM CQ  - dosed in mg/24 hours) patch 21 mg  21 mg Transdermal Daily Onuoha, Chinwendu V, NP   21 mg at 07/31/24 0919   ondansetron  (ZOFRAN -ODT) disintegrating tablet 4 mg  4 mg Oral Q6H PRN Onuoha, Chinwendu V, NP       traZODone  (DESYREL ) tablet 100 mg  100 mg Oral QHS PRN Cloe Sockwell C, NP  Current Outpatient Medications  Medication Sig Dispense Refill   buprenorphine -naloxone  (SUBOXONE ) 8-2 mg SUBL SL tablet Place 1 tablet under the tongue daily at 6 (six) AM. 30 tablet 0   busPIRone  (BUSPAR ) 10 MG tablet Take 2 tablets (20 mg total) by mouth 2 (two) times daily. 30 tablet 0   feeding supplement (ENSURE PLUS HIGH PROTEIN) LIQD Take 237 mLs by mouth 2 (two) times daily between meals. 14220 mL 0   nicotine  (NICODERM CQ  - DOSED IN MG/24 HOURS) 21 mg/24hr patch Place 1 patch (21 mg total) onto the skin daily. 28 patch 0   polyethylene glycol (MIRALAX  / GLYCOLAX ) 17 g packet Take 17 g by mouth daily as needed for mild constipation or moderate constipation. 24 each 0   QUEtiapine  (SEROQUEL ) 100 MG tablet Take 1 tablet (100 mg total) by mouth every morning. 30 tablet 0   QUEtiapine  (SEROQUEL ) 300 MG tablet Take 1 tablet (300 mg total) by mouth at bedtime. 30 tablet 0   senna-docusate (SENOKOT-S) 8.6-50 MG tablet Take 1 tablet by mouth 2 (two) times daily. 60 tablet 0   traZODone  (DESYREL ) 100 MG tablet Take 1 tablet (100 mg total) by mouth at bedtime as needed for sleep. 30 tablet 0    Labs  Lab Results:  Admission on 07/28/2024  Component Date Value Ref Range Status   WBC 07/29/2024 6.5  4.0 - 10.5 K/uL Final   RBC 07/29/2024 3.96  3.87 - 5.11 MIL/uL Final   Hemoglobin 07/29/2024 11.7 (L)  12.0 - 15.0 g/dL Final   HCT 87/97/7974 35.4  (L)  36.0 - 46.0 % Final   MCV 07/29/2024 89.4  80.0 - 100.0 fL Final   MCH 07/29/2024 29.5  26.0 - 34.0 pg Final   MCHC 07/29/2024 33.1  30.0 - 36.0 g/dL Final   RDW 87/97/7974 14.1  11.5 - 15.5 % Final   Platelets 07/29/2024 247  150 - 400 K/uL Final   nRBC 07/29/2024 0.0  0.0 - 0.2 % Final   Neutrophils Relative % 07/29/2024 45  % Final   Neutro Abs 07/29/2024 3.0  1.7 - 7.7 K/uL Final   Lymphocytes Relative 07/29/2024 35  % Final   Lymphs Abs 07/29/2024 2.3  0.7 - 4.0 K/uL Final   Monocytes Relative 07/29/2024 9  % Final   Monocytes Absolute 07/29/2024 0.6  0.1 - 1.0 K/uL Final   Eosinophils Relative 07/29/2024 10  % Final   Eosinophils Absolute 07/29/2024 0.6 (H)  0.0 - 0.5 K/uL Final   Basophils Relative 07/29/2024 1  % Final   Basophils Absolute 07/29/2024 0.0  0.0 - 0.1 K/uL Final   Immature Granulocytes 07/29/2024 0  % Final   Abs Immature Granulocytes 07/29/2024 0.02  0.00 - 0.07 K/uL Final   Performed at [facility], [address] [place], [place] [address]   Sodium 07/29/2024 140  135 - 145 mmol/L Final   Potassium 07/29/2024 3.8  3.5 - 5.1 mmol/L Final   Chloride 07/29/2024 109  98 - 111 mmol/L Final   CO2 07/29/2024 25  22 - 32 mmol/L Final   Glucose, Bld 07/29/2024 98  70 - 99 mg/dL Final   Glucose reference range applies only to samples taken after fasting for at least 8 hours.   BUN 07/29/2024 15  6 - 20 mg/dL Final   Creatinine, Ser 07/29/2024 0.71  0.44 - 1.00 mg/dL Final   Calcium 87/97/7974 9.1  8.9 - 10.3 mg/dL Final   Total Protein 87/97/7974 6.2 (  L)  6.5 - 8.1 g/dL Final   Albumin 87/97/7974 2.9 (L)  3.5 - 5.0 g/dL Final   AST 87/97/7974 139 (H)  15 - 41 U/L Final   ALT 07/29/2024 247 (H)  0 - 44 U/L Final   Alkaline Phosphatase 07/29/2024 90  38 - 126 U/L Final   Total Bilirubin 07/29/2024 0.5  0.0 - 1.2 mg/dL Final   GFR, Estimated 07/29/2024 >60  >60 mL/min Final   Comment: (NOTE) Calculated using the CKD-EPI Creatinine Equation (2021)     Anion gap 07/29/2024 6  5 - 15 Final   Performed at [facility], [address] [place], [place] [address]   Preg Test, Ur 07/29/2024 Negative  Negative Final   POC Amphetamine  UR 07/29/2024 Positive (A)  NONE DETECTED (Cut Off Level 1000 ng/mL) Final   POC Secobarbital (BAR) 07/29/2024 None Detected  NONE DETECTED (Cut Off Level 300 ng/mL) Final   POC Buprenorphine  (BUP) 07/29/2024 Positive (A)  NONE DETECTED (Cut Off Level 10 ng/mL) Final   POC Oxazepam (BZO) 07/29/2024 None Detected  NONE DETECTED (Cut Off Level 300 ng/mL) Final   POC Cocaine UR 07/29/2024 Positive (A)  NONE DETECTED (Cut Off Level 300 ng/mL) Final   POC Methamphetamine UR 07/29/2024 Positive (A)  NONE DETECTED (Cut Off Level 1000 ng/mL) Final   POC Morphine  07/29/2024 None Detected  NONE DETECTED (Cut Off Level 300 ng/mL) Final   POC Methadone UR 07/29/2024 None Detected  NONE DETECTED (Cut Off Level 300 ng/mL) Final   POC Oxycodone  UR 07/29/2024 None Detected  NONE DETECTED (Cut Off Level 100 ng/mL) Final   POC Marijuana UR 07/29/2024 Positive (A)  NONE DETECTED (Cut Off Level 50 ng/mL) Final   Color, Urine 07/29/2024 YELLOW  YELLOW Final   APPearance 07/29/2024 CLOUDY (A)  CLEAR Final   Specific Gravity, Urine 07/29/2024 1.018  1.005 - 1.030 Final   pH 07/29/2024 7.0  5.0 - 8.0 Final   Glucose, UA 07/29/2024 NEGATIVE  NEGATIVE mg/dL Final   Hgb urine dipstick 07/29/2024 NEGATIVE  NEGATIVE Final   Bilirubin Urine 07/29/2024 NEGATIVE  NEGATIVE Final   Ketones, ur 07/29/2024 NEGATIVE  NEGATIVE mg/dL Final   Protein, ur 87/97/7974 NEGATIVE  NEGATIVE mg/dL Final   Nitrite 87/97/7974 NEGATIVE  NEGATIVE Final   Leukocytes,Ua 07/29/2024 MODERATE (A)  NEGATIVE Final   RBC / HPF 07/29/2024 0-5  0 - 5 RBC/hpf Final   WBC, UA 07/29/2024 6-10  0 - 5 WBC/hpf Final   Bacteria, UA 07/29/2024 RARE (A)  NONE SEEN Final   Squamous Epithelial / HPF 07/29/2024 0-5  0 - 5 /HPF Final   Amorphous Crystal 07/29/2024 PRESENT   Final    Performed at [facility], [address], [place], [place] [address]   Total Protein 07/29/2024 6.2 (L)  6.5 - 8.1 g/dL Final   Albumin 87/97/7974 3.0 (L)  3.5 - 5.0 g/dL Final   AST 87/97/7974 139 (H)  15 - 41 U/L Final   ALT 07/29/2024 245 (H)  0 - 44 U/L Final   Alkaline Phosphatase 07/29/2024 91  38 - 126 U/L Final   Total Bilirubin 07/29/2024 0.3  0.0 - 1.2 mg/dL Final   Bilirubin, Direct 07/29/2024 <0.1  0.0 - 0.2 mg/dL Final   Indirect Bilirubin 07/29/2024 NOT CALCULATED  0.3 - 0.9 mg/dL Final   Performed at [facility], [address] [place], [place] [address]   Total Protein 07/31/2024 6.7  6.5 - 8.1 g/dL  Final   Albumin 07/31/2024 3.2 (L)  3.5 - 5.0 g/dL Final   AST 87/95/7974 140 (H)  15 - 41 U/L Final   ALT 07/31/2024 235 (H)  0 - 44 U/L Final   Alkaline Phosphatase 07/31/2024 73  38 - 126 U/L Final   Total Bilirubin 07/31/2024 0.5  0.0 - 1.2 mg/dL Final   Bilirubin, Direct 07/31/2024 <0.1  0.0 - 0.2 mg/dL Final   Indirect Bilirubin 07/31/2024 NOT CALCULATED  0.3 - 0.9 mg/dL Final   Performed at [facility], [address], [place], [place] [address]  Admission on 07/10/2024, Discharged on 07/22/2024  Component Date Value Ref Range Status   T3, Free 07/11/2024 2.2  2.0 - 4.4 pg/mL Final   Comment: (NOTE) Performed At: [facility] [address] [place], [place] [id number] Jennette Shorter MD Ey:[id number]    Free T4 07/11/2024 0.71  0.61 - 1.12 ng/dL Final   Comment: (NOTE) Biotin ingestion may interfere with free T4 tests. If the results are inconsistent with the TSH level, previous test results, or the clinical presentation, then consider biotin interference. If needed, order repeat testing after stopping biotin. Performed at [facility], [address], [place], [place] [address]    Neisseria Gonorrhea 07/12/2024 Negative   Final   Chlamydia 07/12/2024 Negative   Final   Comment 07/12/2024 Normal Reference Ranger Chlamydia -  Negative   Final   Comment 07/12/2024 Normal Reference Range Neisseria Gonorrhea - Negative   Final   Total Protein 07/12/2024 7.1  6.5 - 8.1 g/dL Final   Albumin 88/84/7974 3.3 (L)  3.5 - 5.0 g/dL Final   AST 88/84/7974 67 (H)  15 - 41 U/L Final   ALT 07/12/2024 95 (H)  0 - 44 U/L Final   Alkaline Phosphatase 07/12/2024 110  38 - 126 U/L Final   Total Bilirubin 07/12/2024 0.6  0.0 - 1.2 mg/dL Final   Bilirubin, Direct 07/12/2024 <0.1  0.0 - 0.2 mg/dL Final   Indirect Bilirubin 07/12/2024 NOT CALCULATED  0.3 - 0.9 mg/dL Final   Performed at [facility], [address] [place], [place] [address]   RPR Ser Ql 07/12/2024 NON REACTIVE  NON REACTIVE Final   Performed at [facility], [address] [place], [place] [address]   [facility] test code 07/12/2024 [id number]   Final   [facility] test name 07/12/2024 [id number]   Final   Source ([facility]) 07/12/2024 SERUM   Final   Performed at [facility], [address], [place], [place] [address]   Misc [facility] result 07/12/2024 COMMENT   Final   Comment: (NOTE) Test Ordered: [id number] HIV Ab/p24 Ag with Reflex HIV Ab/p24 Ag Screen           Note:                     BN     Non Reactive                                                  Reference Range: Non Reactive                          HIV-1/HIV-2 antibodies and HIV-1 p24 antigen were NOT detected. There is no laboratory evidence of HIV infection.  HIV Negative Performed At: [facility] [address] [place], [place] [id number] Jennette Shorter MD Ey:[id number]    Color, Urine 07/19/2024 YELLOW  YELLOW Final   APPearance 07/19/2024 CLOUDY (A)  CLEAR Final   Specific Gravity, Urine 07/19/2024 1.018  1.005 - 1.030 Final   pH 07/19/2024 6.0  5.0 - 8.0 Final   Glucose, UA 07/19/2024 NEGATIVE  NEGATIVE mg/dL Final   Hgb urine dipstick 07/19/2024 NEGATIVE  NEGATIVE Final   Bilirubin Urine 07/19/2024 NEGATIVE  NEGATIVE Final   Ketones, ur 07/19/2024 NEGATIVE  NEGATIVE mg/dL Final    Protein, ur 88/77/7974 NEGATIVE  NEGATIVE mg/dL Final   Nitrite 88/77/7974 NEGATIVE  NEGATIVE Final   Leukocytes,Ua 07/19/2024 LARGE (A)  NEGATIVE Final   RBC / HPF 07/19/2024 0-5  0 - 5 RBC/hpf Final   WBC, UA 07/19/2024 11-20  0 - 5 WBC/hpf Final   Bacteria, UA 07/19/2024 RARE (A)  NONE SEEN Final   Squamous Epithelial / HPF 07/19/2024 0-5  0 - 5 /HPF Final   Mucus 07/19/2024 PRESENT   Final   Performed at [facility], [address], [place], [place] [address]   Sodium 07/19/2024 139  135 - 145 mmol/L Final   Potassium 07/19/2024 4.0  3.5 - 5.1 mmol/L Final   Chloride 07/19/2024 103  98 - 111 mmol/L Final   CO2 07/19/2024 25  22 - 32 mmol/L Final   Glucose, Bld 07/19/2024 99  70 - 99 mg/dL Final   Glucose reference range applies only to samples taken after fasting for at least 8 hours.   BUN 07/19/2024 22 (H)  6 - 20 mg/dL Final   Creatinine, Ser 07/19/2024 0.78  0.44 - 1.00 mg/dL Final   Calcium 88/77/7974 8.8 (L)  8.9 - 10.3 mg/dL Final   Total Protein 88/77/7974 7.0  6.5 - 8.1 g/dL Final   Albumin 88/77/7974 3.3 (L)  3.5 - 5.0 g/dL Final   AST 88/77/7974 115 (H)  15 - 41 U/L Final   ALT 07/19/2024 184 (H)  0 - 44 U/L Final   Alkaline Phosphatase 07/19/2024 72  38 - 126 U/L Final   Total Bilirubin 07/19/2024 0.4  0.0 - 1.2 mg/dL Final   GFR, Estimated 07/19/2024 >60  >60 mL/min Final   Comment: (NOTE) Calculated using the CKD-EPI Creatinine Equation (2021)    Anion gap 07/19/2024 11  5 - 15 Final   Performed at [facility], [address], [place], [place] [address]   WBC 07/19/2024 10.6 (H)  4.0 - 10.5 K/uL Final   RBC 07/19/2024 4.38  3.87 - 5.11 MIL/uL Final   Hemoglobin 07/19/2024 12.7  12.0 - 15.0 g/dL Final   HCT 88/77/7974 39.1  36.0 - 46.0 % Final   MCV 07/19/2024 89.3  80.0 - 100.0 fL Final   MCH 07/19/2024 29.0  26.0 - 34.0 pg Final   MCHC 07/19/2024 32.5  30.0 - 36.0 g/dL Final   RDW 88/77/7974 14.0  11.5 - 15.5 % Final   Platelets 07/19/2024 305   150 - 400 K/uL Final   nRBC 07/19/2024 0.0  0.0 - 0.2 % Final   Neutrophils Relative % 07/19/2024 64  % Final   Neutro Abs 07/19/2024 6.8  1.7 - 7.7 K/uL Final   Lymphocytes Relative 07/19/2024 23  % Final   Lymphs Abs 07/19/2024 2.4  0.7 - 4.0 K/uL Final   Monocytes Relative 07/19/2024 7  % Final   Monocytes Absolute 07/19/2024 0.8  0.1 -  1.0 K/uL Final   Eosinophils Relative 07/19/2024 4  % Final   Eosinophils Absolute 07/19/2024 0.4  0.0 - 0.5 K/uL Final   Basophils Relative 07/19/2024 1  % Final   Basophils Absolute 07/19/2024 0.1  0.0 - 0.1 K/uL Final   Immature Granulocytes 07/19/2024 1  % Final   Abs Immature Granulocytes 07/19/2024 0.07  0.00 - 0.07 K/uL Final   Performed at [facility], [address] [place], [place] [address]  Admission on 07/08/2024, Discharged on 07/10/2024  Component Date Value Ref Range Status   WBC 07/08/2024 10.6 (H)  4.0 - 10.5 K/uL Final   RBC 07/08/2024 5.65 (H)  3.87 - 5.11 MIL/uL Final   Hemoglobin 07/08/2024 16.5 (H)  12.0 - 15.0 g/dL Final   HCT 88/88/7974 49.4 (H)  36.0 - 46.0 % Final   MCV 07/08/2024 87.4  80.0 - 100.0 fL Final   MCH 07/08/2024 29.2  26.0 - 34.0 pg Final   MCHC 07/08/2024 33.4  30.0 - 36.0 g/dL Final   RDW 88/88/7974 13.7  11.5 - 15.5 % Final   Platelets 07/08/2024 422 (H)  150 - 400 K/uL Final   nRBC 07/08/2024 0.0  0.0 - 0.2 % Final   Neutrophils Relative % 07/08/2024 75  % Final   Neutro Abs 07/08/2024 7.9 (H)  1.7 - 7.7 K/uL Final   Lymphocytes Relative 07/08/2024 17  % Final   Lymphs Abs 07/08/2024 1.8  0.7 - 4.0 K/uL Final   Monocytes Relative 07/08/2024 7  % Final   Monocytes Absolute 07/08/2024 0.7  0.1 - 1.0 K/uL Final   Eosinophils Relative 07/08/2024 0  % Final   Eosinophils Absolute 07/08/2024 0.0  0.0 - 0.5 K/uL Final   Basophils Relative 07/08/2024 1  % Final   Basophils Absolute 07/08/2024 0.1  0.0 - 0.1 K/uL Final   Immature Granulocytes 07/08/2024 0  % Final   Abs Immature Granulocytes  07/08/2024 0.04  0.00 - 0.07 K/uL Final   Performed at [facility], [address], [place], [place] [address]   Sodium 07/08/2024 140  135 - 145 mmol/L Final   Potassium 07/08/2024 4.1  3.5 - 5.1 mmol/L Final   Chloride 07/08/2024 100  98 - 111 mmol/L Final   CO2 07/08/2024 27  22 - 32 mmol/L Final   Glucose, Bld 07/08/2024 113 (H)  70 - 99 mg/dL Final   Glucose reference range applies only to samples taken after fasting for at least 8 hours.   BUN 07/08/2024 10  6 - 20 mg/dL Final   Creatinine, Ser 07/08/2024 0.74  0.44 - 1.00 mg/dL Final   Calcium 88/88/7974 9.9  8.9 - 10.3 mg/dL Final   Total Protein 88/88/7974 7.9  6.5 - 8.1 g/dL Final   Albumin 88/88/7974 3.5  3.5 - 5.0 g/dL Final   AST 88/88/7974 109 (H)  15 - 41 U/L Final   ALT 07/08/2024 132 (H)  0 - 44 U/L Final   Alkaline Phosphatase 07/08/2024 74  38 - 126 U/L Final   Total Bilirubin 07/08/2024 0.3  0.0 - 1.2 mg/dL Final   GFR, Estimated 07/08/2024 >60  >60 mL/min Final   Comment: (NOTE) Calculated using the CKD-EPI Creatinine Equation (2021)    Anion gap 07/08/2024 13  5 - 15 Final   Performed at [facility], [address] [place], [place] [address]   Hgb A1c MFr Bld 07/08/2024 5.5  4.8 - 5.6 % Final   Comment: (  NOTE) Diagnosis of Diabetes The following HbA1c ranges recommended by the American Diabetes Association (ADA) may be used as an aid in the diagnosis of diabetes mellitus.  Hemoglobin             Suggested A1C NGSP%              Diagnosis  <5.7                   Non Diabetic  5.7-6.4                Pre-Diabetic  >6.4                   Diabetic  <7.0                   Glycemic control for                       adults with diabetes.     Mean Plasma Glucose 07/08/2024 111.15  mg/dL Final   Performed at [facility], [address] [place], [place] [address]   Magnesium  07/08/2024 2.1  1.7 - 2.4 mg/dL Final   Performed at [facility], [address] [place], [place]  [address]   Alcohol, Ethyl (B) 07/08/2024 <15  <15 mg/dL Final   Comment: (NOTE) For medical purposes only. Performed at [facility], [address], [place], [place] [address]    Cholesterol 07/08/2024 174  0 - 200 mg/dL Final   Triglycerides 88/88/7974 102  <150 mg/dL Final   HDL 88/88/7974 42  >40 mg/dL Final   Total CHOL/HDL Ratio 07/08/2024 4.1  RATIO Final   VLDL 07/08/2024 20  0 - 40 mg/dL Final   LDL Cholesterol 07/08/2024 112 (H)  0 - 99 mg/dL Final   Comment:        Total Cholesterol/HDL:CHD Risk Coronary Heart Disease Risk Table                     Men   Women  1/2 Average Risk   3.4   3.3  Average Risk       5.0   4.4  2 X Average Risk   9.6   7.1  3 X Average Risk  23.4   11.0        Use the calculated Patient Ratio above and the CHD Risk Table to determine the patient's CHD Risk.        ATP III CLASSIFICATION (LDL):  <100     mg/dL   Optimal  899-870  mg/dL   Near or Above                    Optimal  130-159  mg/dL   Borderline  839-810  mg/dL   High  >809     mg/dL   Very High Performed at [facility], [address], [place], [place] [address]    Color, Urine 07/09/2024 YELLOW  YELLOW Final   APPearance 07/09/2024 TURBID (A)  CLEAR Final   Specific Gravity, Urine 07/09/2024 1.017  1.005 - 1.030 Final   pH 07/09/2024 7.0  5.0 - 8.0 Final   Glucose, UA 07/09/2024 NEGATIVE  NEGATIVE mg/dL Final   Hgb urine dipstick 07/09/2024 NEGATIVE  NEGATIVE Final   Bilirubin Urine 07/09/2024 NEGATIVE  NEGATIVE Final   Ketones, ur 07/09/2024 NEGATIVE  NEGATIVE mg/dL Final   Protein, ur 88/87/7974  NEGATIVE  NEGATIVE mg/dL Final   Nitrite 88/87/7974 NEGATIVE  NEGATIVE Final   Leukocytes,Ua 07/09/2024 MODERATE (A)  NEGATIVE Final   RBC / HPF 07/09/2024 0-5  0 - 5 RBC/hpf Final   WBC, UA 07/09/2024 21-50  0 - 5 WBC/hpf Final   Bacteria, UA 07/09/2024 MANY (A)  NONE SEEN Final   Squamous Epithelial / HPF 07/09/2024 0-5  0 - 5 /HPF Final   WBC Clumps 07/09/2024  PRESENT   Final   Performed at [facility], [address] [place], [place] [address]   Preg Test, Ur 07/09/2024 Negative  Negative Final   POC Amphetamine  UR 07/09/2024 None Detected  NONE DETECTED (Cut Off Level 1000 ng/mL) Final   POC Secobarbital (BAR) 07/09/2024 None Detected  NONE DETECTED (Cut Off Level 300 ng/mL) Final   POC Buprenorphine  (BUP) 07/09/2024 None Detected  NONE DETECTED (Cut Off Level 10 ng/mL) Final   POC Oxazepam (BZO) 07/09/2024 None Detected  NONE DETECTED (Cut Off Level 300 ng/mL) Final   POC Cocaine UR 07/09/2024 Positive (A)  NONE DETECTED (Cut Off Level 300 ng/mL) Final   POC Methamphetamine UR 07/09/2024 None Detected  NONE DETECTED (Cut Off Level 1000 ng/mL) Final   POC Morphine  07/09/2024 Positive (A)  NONE DETECTED (Cut Off Level 300 ng/mL) Final   POC Methadone UR 07/09/2024 None Detected  NONE DETECTED (Cut Off Level 300 ng/mL) Final   POC Oxycodone  UR 07/09/2024 None Detected  NONE DETECTED (Cut Off Level 100 ng/mL) Final   POC Marijuana UR 07/09/2024 Positive (A)  NONE DETECTED (Cut Off Level 50 ng/mL) Final   TSH 07/08/2024 0.112 (L)  0.350 - 4.500 uIU/mL Final   Comment: Performed by a 3rd Generation assay with a functional sensitivity of <=0.01 uIU/mL. Performed at [facility], [address] [place], [place] [address]    Preg Test, Ur 07/09/2024 NEGATIVE  NEGATIVE Final   Comment:        THE SENSITIVITY OF THIS METHODOLOGY IS >20 mIU/mL.   Admission on 05/17/2024, Discharged on 05/18/2024  Component Date Value Ref Range Status   WBC 05/17/2024 9.0  4.0 - 10.5 K/uL Final   RBC 05/17/2024 4.35  3.87 - 5.11 MIL/uL Final   Hemoglobin 05/17/2024 12.8  12.0 - 15.0 g/dL Final   HCT 90/79/7974 39.7  36.0 - 46.0 % Final   MCV 05/17/2024 91.3  80.0 - 100.0 fL Final   MCH 05/17/2024 29.4  26.0 - 34.0 pg Final   MCHC 05/17/2024 32.2  30.0 - 36.0 g/dL Final   RDW 90/79/7974 14.1  11.5 - 15.5 % Final   Platelets 05/17/2024 286  150 - 400  K/uL Final   nRBC 05/17/2024 0.0  0.0 - 0.2 % Final   Neutrophils Relative % 05/17/2024 65  % Final   Neutro Abs 05/17/2024 5.8  1.7 - 7.7 K/uL Final   Lymphocytes Relative 05/17/2024 23  % Final   Lymphs Abs 05/17/2024 2.1  0.7 - 4.0 K/uL Final   Monocytes Relative 05/17/2024 8  % Final   Monocytes Absolute 05/17/2024 0.8  0.1 - 1.0 K/uL Final   Eosinophils Relative 05/17/2024 4  % Final   Eosinophils Absolute 05/17/2024 0.3  0.0 - 0.5 K/uL Final   Basophils Relative 05/17/2024 0  % Final   Basophils Absolute 05/17/2024 0.0  0.0 - 0.1 K/uL Final   Immature Granulocytes 05/17/2024 0  % Final   Abs Immature Granulocytes 05/17/2024 0.04  0.00 - 0.07 K/uL Final  Performed at [facility], [address] [place], [place] [address]   Preg, Serum 05/17/2024 NEGATIVE  NEGATIVE Final   Comment:        THE SENSITIVITY OF THIS METHODOLOGY IS >10 mIU/mL. Performed at [facility], [address] [place], [place] [address]    Sodium 05/17/2024 141  135 - 145 mmol/L Final   Potassium 05/17/2024 3.5  3.5 - 5.1 mmol/L Final   Chloride 05/17/2024 104  98 - 111 mmol/L Final   CO2 05/17/2024 26  22 - 32 mmol/L Final   Glucose, Bld 05/17/2024 118 (H)  70 - 99 mg/dL Final   Glucose reference range applies only to samples taken after fasting for at least 8 hours.   BUN 05/17/2024 11  6 - 20 mg/dL Final   Creatinine, Ser 05/17/2024 0.78  0.44 - 1.00 mg/dL Final   Calcium 90/79/7974 8.8 (L)  8.9 - 10.3 mg/dL Final   Total Protein 90/79/7974 7.0  6.5 - 8.1 g/dL Final   Albumin 90/79/7974 3.0 (L)  3.5 - 5.0 g/dL Final   AST 90/79/7974 115 (H)  15 - 41 U/L Final   ALT 05/17/2024 144 (H)  0 - 44 U/L Final   Alkaline Phosphatase 05/17/2024 71  38 - 126 U/L Final   Total Bilirubin 05/17/2024 0.5  0.0 - 1.2 mg/dL Final   GFR, Estimated 05/17/2024 >60  >60 mL/min Final   Comment: (NOTE) Calculated using the CKD-EPI Creatinine Equation (2021)    Anion gap 05/17/2024 11  5 - 15 Final    Performed at [facility], [address] [place], [place] [address]   Sodium 05/17/2024 142  135 - 145 mmol/L Final   Potassium 05/17/2024 3.5  3.5 - 5.1 mmol/L Final   Chloride 05/17/2024 103  98 - 111 mmol/L Final   BUN 05/17/2024 12  6 - 20 mg/dL Final   Creatinine, Ser 05/17/2024 0.70  0.44 - 1.00 mg/dL Final   Glucose, Bld 90/79/7974 120 (H)  70 - 99 mg/dL Final   Glucose reference range applies only to samples taken after fasting for at least 8 hours.   Calcium, Ion 05/17/2024 1.13 (L)  1.15 - 1.40 mmol/L Final   TCO2 05/17/2024 28  22 - 32 mmol/L Final   Hemoglobin 05/17/2024 11.6 (L)  12.0 - 15.0 g/dL Final   HCT 90/79/7974 34.0 (L)  36.0 - 46.0 % Final  Admission on 03/25/2024, Discharged on 03/25/2024  Component Date Value Ref Range Status   Sodium 03/25/2024 140  135 - 145 mmol/L Final   Potassium 03/25/2024 3.2 (L)  3.5 - 5.1 mmol/L Final   Chloride 03/25/2024 105  98 - 111 mmol/L Final   BUN 03/25/2024 9  6 - 20 mg/dL Final   Creatinine, Ser 03/25/2024 0.70  0.44 - 1.00 mg/dL Final   Glucose, Bld 92/70/7974 132 (H)  70 - 99 mg/dL Final   Glucose reference range applies only to samples taken after fasting for at least 8 hours.   Calcium, Ion 03/25/2024 0.99 (L)  1.15 - 1.40 mmol/L Final   TCO2 03/25/2024 24  22 - 32 mmol/L Final   Hemoglobin 03/25/2024 12.9  12.0 - 15.0 g/dL Final   HCT 92/70/7974 38.0  36.0 - 46.0 % Final   Lactic Acid, Venous 03/25/2024 3.2 (HH)  0.5 - 1.9 mmol/L Final   Comment 03/25/2024 NOTIFIED PHYSICIAN   Final  Admission on 02/10/2024, Discharged on 02/12/2024  Component Date Value Ref  Range Status   WBC 02/10/2024 16.8 (H)  4.0 - 10.5 K/uL Final   RBC 02/10/2024 4.46  3.87 - 5.11 MIL/uL Final   Hemoglobin 02/10/2024 12.7  12.0 - 15.0 g/dL Final   HCT 93/84/7974 39.5  36.0 - 46.0 % Final   MCV 02/10/2024 88.6  80.0 - 100.0 fL Final   MCH 02/10/2024 28.5  26.0 - 34.0 pg Final   MCHC 02/10/2024 32.2  30.0 - 36.0 g/dL Final   RDW  93/84/7974 14.8  11.5 - 15.5 % Final   Platelets 02/10/2024 331  150 - 400 K/uL Final   nRBC 02/10/2024 0.0  0.0 - 0.2 % Final   Neutrophils Relative % 02/10/2024 83  % Final   Neutro Abs 02/10/2024 14.0 (H)  1.7 - 7.7 K/uL Final   Lymphocytes Relative 02/10/2024 9  % Final   Lymphs Abs 02/10/2024 1.6  0.7 - 4.0 K/uL Final   Monocytes Relative 02/10/2024 6  % Final   Monocytes Absolute 02/10/2024 1.0  0.1 - 1.0 K/uL Final   Eosinophils Relative 02/10/2024 1  % Final   Eosinophils Absolute 02/10/2024 0.1  0.0 - 0.5 K/uL Final   Basophils Relative 02/10/2024 0  % Final   Basophils Absolute 02/10/2024 0.1  0.0 - 0.1 K/uL Final   Immature Granulocytes 02/10/2024 1  % Final   Abs Immature Granulocytes 02/10/2024 0.08 (H)  0.00 - 0.07 K/uL Final   Performed at [facility], [address], [place], [place] [address]   Sodium 02/10/2024 136  135 - 145 mmol/L Final   Potassium 02/10/2024 3.9  3.5 - 5.1 mmol/L Final   Chloride 02/10/2024 101  98 - 111 mmol/L Final   CO2 02/10/2024 25  22 - 32 mmol/L Final   Glucose, Bld 02/10/2024 94  70 - 99 mg/dL Final   Glucose reference range applies only to samples taken after fasting for at least 8 hours.   BUN 02/10/2024 7  6 - 20 mg/dL Final   Creatinine, Ser 02/10/2024 0.66  0.44 - 1.00 mg/dL Final   Calcium 93/84/7974 8.7 (L)  8.9 - 10.3 mg/dL Final   Total Protein 93/84/7974 6.8  6.5 - 8.1 g/dL Final   Albumin 93/84/7974 3.2 (L)  3.5 - 5.0 g/dL Final   AST 93/84/7974 215 (H)  15 - 41 U/L Final   ALT 02/10/2024 143 (H)  0 - 44 U/L Final   Alkaline Phosphatase 02/10/2024 82  38 - 126 U/L Final   Total Bilirubin 02/10/2024 1.0  0.0 - 1.2 mg/dL Final   GFR, Estimated 02/10/2024 >60  >60 mL/min Final   Comment: (NOTE) Calculated using the CKD-EPI Creatinine Equation (2021)    Anion gap 02/10/2024 10  5 - 15 Final   Performed at [facility], [address] [place], [place] [address]   Lipase 02/10/2024 25  11 - 51 U/L Final   Performed  at [facility], [address], [place], [place] [address]   Specimen Source 02/10/2024 URINE, CLEAN CATCH   Final   Color, Urine 02/10/2024 YELLOW  YELLOW Final   APPearance 02/10/2024 CLOUDY (A)  CLEAR Final   Specific Gravity, Urine 02/10/2024 1.015  1.005 - 1.030 Final   pH 02/10/2024 7.0  5.0 - 8.0 Final   Glucose, UA 02/10/2024 NEGATIVE  NEGATIVE mg/dL Final   Hgb urine dipstick 02/10/2024 NEGATIVE  NEGATIVE Final   Bilirubin Urine 02/10/2024 NEGATIVE  NEGATIVE Final   Ketones, ur 02/10/2024 NEGATIVE  NEGATIVE mg/dL Final   Protein, ur 93/84/7974 30 (A)  NEGATIVE mg/dL Final   Nitrite 93/84/7974 POSITIVE (A)  NEGATIVE Final   Leukocytes,Ua 02/10/2024 MODERATE (A)  NEGATIVE Final   RBC / HPF 02/10/2024 0-5  0 - 5 RBC/hpf Final   WBC, UA 02/10/2024 21-50  0 - 5 WBC/hpf Final   Comment:        Reflex urine culture not performed if WBC <=10, OR if Squamous epithelial cells >5. If Squamous epithelial cells >5 suggest recollection.    Bacteria, UA 02/10/2024 FEW (A)  NONE SEEN Final   Squamous Epithelial / HPF 02/10/2024 0-5  0 - 5 /HPF Final   Mucus 02/10/2024 PRESENT   Final   Amorphous Crystal 02/10/2024 PRESENT   Final   Performed at [facility], [address], [place], [place] [address]   Preg Test, Ur 02/10/2024 NEGATIVE  NEGATIVE Final   Comment:        THE SENSITIVITY OF THIS METHODOLOGY IS >25 mIU/mL. Performed at [facility], [address], [place], [place] [address]    Specimen Description 02/10/2024 BLOOD RIGHT FOREARM   Final   Special Requests 02/10/2024 BOTTLES DRAWN AEROBIC AND ANAEROBIC Blood Culture adequate volume   Final   Culture 02/10/2024    Final                   Value:NO GROWTH 5 DAYS Performed at [facility], [address] [place], [place] [address]    Report Status 02/10/2024 02/15/2024 FINAL   Final   Specimen Description 02/10/2024 BLOOD LEFT ANTECUBITAL   Final   Special Requests 02/10/2024 BOTTLES DRAWN AEROBIC  AND ANAEROBIC Blood Culture results may not be optimal due to an inadequate volume of blood received in culture bottles   Final   Culture 02/10/2024    Final                   Value:NO GROWTH 5 DAYS Performed at [facility], [address] [place], [place] [address]    Report Status 02/10/2024 02/15/2024 FINAL   Final   Neisseria Gonorrhea 02/10/2024 Positive (A)   Final   Chlamydia 02/10/2024 Negative   Final   Comment 02/10/2024 Normal Reference Ranger Chlamydia - Negative   Final   Comment 02/10/2024 Normal Reference Range Neisseria Gonorrhea - Negative   Final   Yeast Wet Prep HPF POC 02/10/2024 NONE SEEN  NONE SEEN Final   Trich, Wet Prep 02/10/2024 NONE SEEN  NONE SEEN Final   Clue Cells Wet Prep HPF POC 02/10/2024 NONE SEEN  NONE SEEN Final   WBC, Wet Prep HPF POC 02/10/2024 >=10 (A)  <10 Final   Sperm 02/10/2024 NONE SEEN   Final   Performed at [facility], [address], [place], [place] [address]   HIV Screen 4th Generation wRfx 02/10/2024 Non Reactive  Non Reactive Final   Performed at [facility], [address] [place], [place] [address]   RPR Ser Ql 02/10/2024 NON REACTIVE  NON REACTIVE Final   Performed at [facility], [address] [place], [place] [address]   Hepatitis B Surface Ag 02/10/2024 NON REACTIVE  NON REACTIVE Final   HCV Ab 02/10/2024 Reactive (A)  NON REACTIVE Final   Comment: (NOTE) The CDC recommends that a Reactive HCV antibody result be followed up  with a HCV Nucleic Acid Amplification test.     Hep A  IgM 02/10/2024 NON REACTIVE  NON REACTIVE Final   Hep B C IgM 02/10/2024 NON REACTIVE  NON REACTIVE Final   Performed at [facility], [address] [place], [place] [address]   Specimen Description 02/10/2024 URINE, RANDOM   Final   Special Requests 02/10/2024    Final                   Value:NONE Reflexed from [id number] Performed at [facility], [address], [place], [place] [address]    Culture  02/10/2024 MULTIPLE SPECIES PRESENT, SUGGEST RECOLLECTION (A)   Final   Report Status 02/10/2024 02/11/2024 FINAL   Final   WBC 02/11/2024 17.9 (H)  4.0 - 10.5 K/uL Final   RBC 02/11/2024 4.32  3.87 - 5.11 MIL/uL Final   Hemoglobin 02/11/2024 12.4  12.0 - 15.0 g/dL Final   HCT 93/83/7974 38.1  36.0 - 46.0 % Final   MCV 02/11/2024 88.2  80.0 - 100.0 fL Final   MCH 02/11/2024 28.7  26.0 - 34.0 pg Final   MCHC 02/11/2024 32.5  30.0 - 36.0 g/dL Final   RDW 93/83/7974 14.9  11.5 - 15.5 % Final   Platelets 02/11/2024 328  150 - 400 K/uL Final   nRBC 02/11/2024 0.0  0.0 - 0.2 % Final   Performed at [facility], [address] [place], [place] [address]   Sodium 02/11/2024 134 (L)  135 - 145 mmol/L Final   Potassium 02/11/2024 4.1  3.5 - 5.1 mmol/L Final   Chloride 02/11/2024 103  98 - 111 mmol/L Final   CO2 02/11/2024 23  22 - 32 mmol/L Final   Glucose, Bld 02/11/2024 245 (H)  70 - 99 mg/dL Final   Glucose reference range applies only to samples taken after fasting for at least 8 hours.   BUN 02/11/2024 9  6 - 20 mg/dL Final   Creatinine, Ser 02/11/2024 0.76  0.44 - 1.00 mg/dL Final   Calcium 93/83/7974 7.9 (L)  8.9 - 10.3 mg/dL Final   Total Protein 93/83/7974 5.7 (L)  6.5 - 8.1 g/dL Final   Albumin 93/83/7974 2.3 (L)  3.5 - 5.0 g/dL Final   AST 93/83/7974 109 (H)  15 - 41 U/L Final   ALT 02/11/2024 95 (H)  0 - 44 U/L Final   Alkaline Phosphatase 02/11/2024 72  38 - 126 U/L Final   Total Bilirubin 02/11/2024 0.7  0.0 - 1.2 mg/dL Final   GFR, Estimated 02/11/2024 >60  >60 mL/min Final   Comment: (NOTE) Calculated using the CKD-EPI Creatinine Equation (2021)    Anion gap 02/11/2024 8  5 - 15 Final   Performed at [facility], [address], [place], [place] [address]   HCV RNA Qnt(log copy/mL) 02/11/2024 6.778  log10 IU/mL Corrected   HepC Qn 02/11/2024 6,000,000  IU/mL Final   Test Information 02/11/2024 Comment   Final   Comment: (NOTE) The quantitative range of this  assay is 15 IU/mL to 100 million IU/mL.    Hcv Genotype 02/11/2024 Comment   Final   Comment: (NOTE) To be performed on this specimen. Performed At: [facility] [address] [place], [place] [id number] Jennette Shorter MD Ey:[id number]    aPTT 02/11/2024 37 (H)  24 - 36 seconds Final   Comment:        IF BASELINE aPTT IS ELEVATED, SUGGEST PATIENT RISK ASSESSMENT BE USED TO DETERMINE APPROPRIATE ANTICOAGULANT THERAPY. Performed at [facility] Lab,  [address] [place], [place] [address]    Prothrombin Time 02/11/2024 15.4 (H)  11.4 - 15.2 seconds Final   INR 02/11/2024 1.2  0.8 - 1.2 Final   Comment: (NOTE) INR goal varies based on device and disease states. Performed at [facility] Lab, [address], [place], [place] [address]    Fibrinogen  02/11/2024 524 (H)  210 - 475 mg/dL Final   Comment: (NOTE) Fibrinogen  results may be underestimated in patients receiving thrombolytic therapy. Performed at [facility], [address] [place], [place] [address]    TSH 02/11/2024 0.373  0.350 - 4.500 uIU/mL Final   Comment: Performed by a 3rd Generation assay with a functional sensitivity of <=0.01 uIU/mL. Performed at [facility], [address], [place], [place] [address]    WBC 02/12/2024 9.0  4.0 - 10.5 K/uL Final   RBC 02/12/2024 4.32  3.87 - 5.11 MIL/uL Final   Hemoglobin 02/12/2024 12.6  12.0 - 15.0 g/dL Final   HCT 93/82/7974 38.4  36.0 - 46.0 % Final   MCV 02/12/2024 88.9  80.0 - 100.0 fL Final   MCH 02/12/2024 29.2  26.0 - 34.0 pg Final   MCHC 02/12/2024 32.8  30.0 - 36.0 g/dL Final   RDW 93/82/7974 14.9  11.5 - 15.5 % Final   Platelets 02/12/2024 340  150 - 400 K/uL Final   nRBC 02/12/2024 0.0  0.0 - 0.2 % Final   Performed at [facility] Lab, [address], [place], [place] [address]   Sodium 02/12/2024 138  135 - 145 mmol/L Final   Potassium 02/12/2024 3.9  3.5 - 5.1 mmol/L Final   Chloride 02/12/2024 106  98 - 111 mmol/L Final    CO2 02/12/2024 22  22 - 32 mmol/L Final   Glucose, Bld 02/12/2024 147 (H)  70 - 99 mg/dL Final   Glucose reference range applies only to samples taken after fasting for at least 8 hours.   BUN 02/12/2024 10  6 - 20 mg/dL Final   Creatinine, Ser 02/12/2024 0.66  0.44 - 1.00 mg/dL Final   Calcium 93/82/7974 8.2 (L)  8.9 - 10.3 mg/dL Final   Total Protein 93/82/7974 5.9 (L)  6.5 - 8.1 g/dL Final   Albumin 93/82/7974 2.2 (L)  3.5 - 5.0 g/dL Final   AST 93/82/7974 133 (H)  15 - 41 U/L Final   ALT 02/12/2024 93 (H)  0 - 44 U/L Final   Alkaline Phosphatase 02/12/2024 63  38 - 126 U/L Final   Total Bilirubin 02/12/2024 0.4  0.0 - 1.2 mg/dL Final   GFR, Estimated 02/12/2024 >60  >60 mL/min Final   Comment: (NOTE) Calculated using the CKD-EPI Creatinine Equation (2021)    Anion gap 02/12/2024 10  5 - 15 Final   Performed at [facility] Lab, [address] [place], [place] [address]   Hgb A1c MFr Bld 02/12/2024 5.4  4.8 - 5.6 % Final   Comment: (NOTE) Diagnosis of Diabetes The following HbA1c ranges recommended by the American Diabetes Association (ADA) may be used as an aid in the diagnosis of diabetes mellitus.  Hemoglobin             Suggested A1C NGSP%              Diagnosis  <5.7                   Non Diabetic  5.7-6.4  Pre-Diabetic  >6.4                   Diabetic  <7.0                   Glycemic control for                       adults with diabetes.     Mean Plasma Glucose 02/12/2024 108.28  mg/dL Final   Performed at [facility] Lab, [address] [place], [place] [address]   Hepatitis C Genotype 02/11/2024 1a   Final   Comment: (NOTE) This test was developed and its performance characteristics determined by [facility]. It has not been cleared or approved by the Food and Drug Administration. Performed At: [facility] [address] [place], [place] [id number] Jennette Shorter MD Ey:[id number]     Blood Alcohol level:  Lab Results   Component Value Date   [facility] <15 07/08/2024    Metabolic Disorder Labs: Lab Results  Component Value Date   HGBA1C 5.5 07/08/2024   MPG 111.15 07/08/2024   MPG 108.28 02/12/2024   No results found for: PROLACTIN Lab Results  Component Value Date   CHOL 174 07/08/2024   TRIG 102 07/08/2024   HDL 42 07/08/2024   CHOLHDL 4.1 07/08/2024   VLDL 20 07/08/2024   LDLCALC 112 (H) 07/08/2024   LDLCALC 73 09/22/2020    Therapeutic Lab Levels: No results found for: LITHIUM No results found for: VALPROATE No results found for: CBMZ  Physical Findings   GAD-7    Flowsheet Row Procedure visit from 11/01/2020 in Center for [facility] at [facility] Office Visit from 09/22/2020 in Center for [facility] at [facility] Clinical Support from 06/21/2020 in Center for [employer] at [facility] Clinical Support from 04/05/2020 in Center for [employer] at [facility] Clinical Support from 01/19/2020 in Center for [facility] at [facility]  Total GAD-7 Score 7 2 10 8  0   PHQ2-9    Flowsheet Row ED from 07/28/2024 in [facility] ED from 07/10/2024 in [facility] ED from 07/08/2024 in [facility] Procedure visit from 11/01/2020 in Center for [facility] at [facility] Office Visit from 09/22/2020 in [facility] for [facility] at [facility]  PHQ-2 Total Score 2 2 4  0 0  PHQ-9 Total Score 6 7 12  0 2   Flowsheet Row ED from 07/28/2024 in [facility] ED from 07/10/2024 in [facility] ED from 07/08/2024 in [facility]  C-SSRS RISK CATEGORY No Risk No Risk No Risk     Musculoskeletal  Strength & Muscle Tone: within normal limits Gait & Station:  normal Patient leans: N/A  Psychiatric Specialty Exam  Presentation  General Appearance:  Casual  Eye Contact: Fair  Speech: Clear and Coherent  Speech Volume: Normal  Handedness: Right   Mood and Affect  Mood: Anxious  Affect: Congruent; Depressed   Thought Process  Thought Processes: Coherent  Descriptions of Associations:Intact  Orientation:Full (Time, Place and Person)  Thought Content:WDL  Diagnosis of Schizophrenia or Schizoaffective disorder in past: No    Hallucinations:Hallucinations: None  Ideas of Reference:None  Suicidal Thoughts:Suicidal Thoughts: No  Homicidal Thoughts:Homicidal Thoughts: No   Sensorium  Memory: Recent Fair; Immediate Good  Judgment: Fair  Insight: Fair   Chartered Certified Accountant: Fair  Attention Span: Fair  Recall: [employer] of Knowledge: Fair  Language: Fair   Psychomotor Activity  Psychomotor Activity: Psychomotor Activity: Normal   Assets  Assets: Communication Skills; Desire for Improvement; Financial Resources/Insurance; Housing; Physical Health; Resilience; Social Support; Vocational/Educational   Sleep  Sleep: Sleep: Fair  Estimated Sleeping Duration (Last 24 Hours): 8.50-10.75 hours  No data recorded  Physical Exam  Physical Exam Vitals and nursing note reviewed.  Constitutional:      Appearance: Normal appearance.  HENT:     Head: Normocephalic.     Nose: Nose normal.  Eyes:     Extraocular Movements: Extraocular movements intact.  Cardiovascular:     Rate and Rhythm: Normal rate.  Pulmonary:     Effort: Pulmonary effort is normal.  Musculoskeletal:        General: Normal range of motion.     Cervical back: Normal range of motion.  Neurological:     General: No focal deficit present.     Mental Status: She is alert and oriented to person, place, and time.  Psychiatric:        Attention and Perception: Attention normal. She does not perceive auditory  or visual hallucinations.        Mood and Affect: Mood and affect normal.        Speech: Speech normal.        Behavior: Behavior normal.        Thought Content: Thought content normal. Thought content is not paranoid. Thought content does not include homicidal or suicidal ideation. Thought content does not include homicidal or suicidal plan.        Judgment: Judgment normal.    Review of Systems  Constitutional: Negative.   HENT: Negative.    Eyes: Negative.   Respiratory: Negative.    Cardiovascular: Negative.   Gastrointestinal: Negative.   Genitourinary: Negative.   Musculoskeletal: Negative.   Skin: Negative.   Neurological: Negative.   Endo/Heme/Allergies: Negative.   Psychiatric/Behavioral:  Positive for substance abuse. The patient is nervous/anxious.    Blood pressure 107/60, pulse 74, temperature 98.5 F (36.9 C), temperature source Oral, resp. rate 16, weight 147 lb (66.7 kg), SpO2 98%. Body mass index is 23.02 kg/m.  Treatment Plan Summary: Daily contact with patient to assess and evaluate symptoms and progress in treatment and Medication management Pt displays pervasive pattern of relapse and noncompliance with substance abuse treatment. Discussed this with patient and encouraged abstinence from substances. She is aware of plan to discharge tomorrow with peer support specialist who will assist her with temporary housing at the [facility] until [facility] is available at [facility] on 08/06/24.    Labs reviewed:  -Awaiting Hep C lab results to determine if causing elevated LFTs. Holding Seroquel  until hepatic function tests and Hep C labs results return as discussed with Dr. Cole.  - Reordered hepatic function tests to monitor LFTs. Reviewed and AST decreased from 247 to 235.   Clonidine  Detox protocol: - Last Clonidine  dosage will be 08/01/24 prior to discharge.  -COWS scores have been 0.   Medications:  - Restart Buspar  20mg  BID for anxiety - Start Melatonin 3mg  to help with  sleep - Increased Trazodone  back to home dosage from 50mg  to 100mg  PRN to help improve sleep.  - Continue nicotine  patch 21 mg transdermal over 24 hours daily    Continue PRN medications:  -maalox/mylanta 30 mL  by mouth every 4 hours PRN for indigestion -hydroxyzine  (atarax ) tablet 25 mg by mouth every 6 hours PRN for anxiety -Dicyclomine  20 mg by mouth every 6 hours as needed for abdominal cramping -feeding supplement (ENSURE) 237 mL by mouth bid between meals -Loperamide  2 to 4 mg by mouth as needed for diarrhea or loose stools -Methocarbamol  500 mg every 8 hours as needed for muscle spasms -Naproxen  500 mg twice daily as needed for aching, pain or discomfort -Ondansetron  disintegrating tablet 4mg  every 6 hours as needed/nausea or vomiting -milk of magnesia 30 mL by mouth daily as needed for mild constipation      Alan JAYSON Mcardle, NP 07/31/2024 5:18 PM

## 2024-07-31 NOTE — Group Note (Signed)
 Group Topic: Overcoming Obstacles  Group Date: 07/31/2024 Start Time: 1000 End Time: 1045 Facilitators: Lonzell Dwayne RAMAN, NT  Department: [facility]  Number of Participants: 2  Group Focus: chemical dependency issues and suicide prevention skills Treatment Modality:  Behavior Modification Therapy and Spiritual Interventions utilized were mental fitness and patient education Purpose: increase insight, regain self-worth, and relapse prevention strategies  Name: Morgan Donaldson Date of Birth: Mar 05, 1981  MR: [id number]    Level of Participation: Patient attended groupmoderate Quality of Participation: cooperative Interactions with others: gave feedback Mood/Affect: anxious Triggers (if applicable): Everything  Cognition: loose and no insight Progress: Minimal Response: Appropriate  Plan: follow-up needed  Patients Problems:  Patient Active Problem List   Diagnosis Date Noted   Hyperthyroidism 07/16/2024   Opioid dependence with uncomplicated intoxication (HCC) 07/14/2024   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   Elevated liver function tests 07/14/2024   Opioid use with withdrawal (HCC) 07/14/2024   Polysubstance abuse (HCC) 07/10/2024   Pyosalpingitis 02/12/2024   Hepatitis C antibody detected 02/11/2024   PID (acute pelvic inflammatory disease) 02/10/2024   Gonorrhea 12/10/2023   Group A streptococcal infection 12/09/2023   IV drug abuse (HCC) 12/09/2023   Acute cystitis 12/09/2023   GAD (generalized anxiety disorder) 06/21/2020   Attention deficit hyperactivity disorder (ADHD), 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

## 2024-07-31 NOTE — ED Notes (Signed)
 Pt denies SI/HI/AVH. Headache managed with prn naproxen . Compliant with scheduled meds, prn atarax  and trazodone  given. She was anxious, needy, and cooperative. Pt sleeping in bed, no acute distress noted. Respirations even and unlabored. Continue to monitor for safety.

## 2024-07-31 NOTE — ED Notes (Signed)
 Pt sitting in dayroom watching TV with peers, appropriate interactions. No acute distress noted. Continue to monitor for safety.

## 2024-07-31 NOTE — Treatment Plan (Signed)
 Interdisciplinary Treatment and Diagnostic Plan Update  07/31/2024 Time of Session: 2:00pm Morgan Donaldson MRN: [id number]  Diagnosis:  Final diagnoses:  Polysubstance abuse (HCC)  Fentanyl  use disorder, severe, dependence (HCC)  Marijuana user  IV drug user  Homelessness  Cocaine abuse (HCC)  Elevated liver enzymes     Current Medications:  Current Facility-Administered Medications  Medication Dose Route Frequency Provider Last Rate Last Admin   alum & mag hydroxide-simeth (MAALOX/MYLANTA) 200-200-20 MG/5ML suspension 30 mL  30 mL Oral Q4H PRN Onuoha, Chinwendu V, NP       busPIRone  (BUSPAR ) tablet 20 mg  20 mg Oral BID Brent, Amanda C, NP   20 mg at 07/31/24 1113   cloNIDine  (CATAPRES ) tablet 0.1 mg  0.1 mg Oral BH-qamhs Onuoha, Chinwendu V, NP       Followed by   NOREEN ON 08/03/2024] cloNIDine  (CATAPRES ) tablet 0.1 mg  0.1 mg Oral QAC breakfast Onuoha, Chinwendu V, NP       dicyclomine  (BENTYL ) tablet 20 mg  20 mg Oral Q6H PRN Onuoha, Chinwendu V, NP       haloperidol  (HALDOL ) tablet 5 mg  5 mg Oral TID PRN Onuoha, Chinwendu V, NP       And   diphenhydrAMINE  (BENADRYL ) capsule 50 mg  50 mg Oral TID PRN Onuoha, Chinwendu V, NP       haloperidol  lactate (HALDOL ) injection 5 mg  5 mg Intramuscular TID PRN Onuoha, Chinwendu V, NP       And   diphenhydrAMINE  (BENADRYL ) injection 50 mg  50 mg Intramuscular TID PRN Onuoha, Chinwendu V, NP       And   LORazepam  (ATIVAN ) injection 2 mg  2 mg Intramuscular TID PRN Onuoha, Chinwendu V, NP       haloperidol  lactate (HALDOL ) injection 10 mg  10 mg Intramuscular TID PRN Onuoha, Chinwendu V, NP       And   diphenhydrAMINE  (BENADRYL ) injection 50 mg  50 mg Intramuscular TID PRN Onuoha, Chinwendu V, NP       And   LORazepam  (ATIVAN ) injection 2 mg  2 mg Intramuscular TID PRN Onuoha, Chinwendu V, NP       feeding supplement (ENSURE PLUS HIGH PROTEIN) liquid 237 mL  237 mL Oral BID BM Onuoha, Chinwendu V, NP   237 mL at 07/31/24 0921    hydrOXYzine  (ATARAX ) tablet 50 mg  50 mg Oral Q6H PRN Ramzan, Mariam, NP   50 mg at 07/30/24 2203   loperamide  (IMODIUM ) capsule 2-4 mg  2-4 mg Oral PRN Onuoha, Chinwendu V, NP       magnesium  hydroxide (MILK OF MAGNESIA) suspension 30 mL  30 mL Oral Daily PRN Onuoha, Chinwendu V, NP       methocarbamol  (ROBAXIN ) tablet 500 mg  500 mg Oral Q8H PRN Onuoha, Chinwendu V, NP   500 mg at 07/29/24 1456   naproxen  (NAPROSYN ) tablet 500 mg  500 mg Oral BID PRN Onuoha, Chinwendu V, NP   500 mg at 07/30/24 2204   nicotine  (NICODERM CQ  - dosed in mg/24 hours) patch 21 mg  21 mg Transdermal Daily Onuoha, Chinwendu V, NP   21 mg at 07/31/24 0919   ondansetron  (ZOFRAN -ODT) disintegrating tablet 4 mg  4 mg Oral Q6H PRN Onuoha, Chinwendu V, NP       traZODone  (DESYREL ) tablet 100 mg  100 mg Oral QHS PRN Brent, Amanda C, NP       Current Outpatient Medications  Medication Sig Dispense Refill  buprenorphine -naloxone  (SUBOXONE ) 8-2 mg SUBL SL tablet Place 1 tablet under the tongue daily at 6 (six) AM. 30 tablet 0   busPIRone  (BUSPAR ) 10 MG tablet Take 2 tablets (20 mg total) by mouth 2 (two) times daily. 30 tablet 0   feeding supplement (ENSURE PLUS HIGH PROTEIN) LIQD Take 237 mLs by mouth 2 (two) times daily between meals. 14220 mL 0   nicotine  (NICODERM CQ  - DOSED IN MG/24 HOURS) 21 mg/24hr patch Place 1 patch (21 mg total) onto the skin daily. 28 patch 0   polyethylene glycol (MIRALAX  / GLYCOLAX ) 17 g packet Take 17 g by mouth daily as needed for mild constipation or moderate constipation. 24 each 0   QUEtiapine  (SEROQUEL ) 100 MG tablet Take 1 tablet (100 mg total) by mouth every morning. 30 tablet 0   QUEtiapine  (SEROQUEL ) 300 MG tablet Take 1 tablet (300 mg total) by mouth at bedtime. 30 tablet 0   senna-docusate (SENOKOT-S) 8.6-50 MG tablet Take 1 tablet by mouth 2 (two) times daily. 60 tablet 0   traZODone  (DESYREL ) 100 MG tablet Take 1 tablet (100 mg total) by mouth at bedtime as needed for sleep. 30 tablet  0   PTA Medications: Prior to Admission medications   Medication Sig Start Date End Date Taking? Authorizing Provider  buprenorphine -naloxone  (SUBOXONE ) 8-2 mg SUBL SL tablet Place 1 tablet under the tongue daily at 6 (six) AM. 07/22/24 08/21/24 Yes Lawrnce Garvin PARAS, MD  busPIRone  (BUSPAR ) 10 MG tablet Take 2 tablets (20 mg total) by mouth 2 (two) times daily. 07/22/24 08/21/24 Yes Lawrnce Garvin PARAS, MD  feeding supplement (ENSURE PLUS HIGH PROTEIN) LIQD Take 237 mLs by mouth 2 (two) times daily between meals. 07/22/24 08/21/24 Yes Lawrnce Garvin PARAS, MD  nicotine  (NICODERM CQ  - DOSED IN MG/24 HOURS) 21 mg/24hr patch Place 1 patch (21 mg total) onto the skin daily. 07/22/24  Yes Lawrnce Garvin PARAS, MD  polyethylene glycol (MIRALAX  / GLYCOLAX ) 17 g packet Take 17 g by mouth daily as needed for mild constipation or moderate constipation. 07/22/24 08/21/24 Yes Lawrnce Garvin PARAS, MD  QUEtiapine  (SEROQUEL ) 100 MG tablet Take 1 tablet (100 mg total) by mouth every morning. 07/22/24 08/21/24 Yes Lawrnce Garvin PARAS, MD  QUEtiapine  (SEROQUEL ) 300 MG tablet Take 1 tablet (300 mg total) by mouth at bedtime. 07/22/24 08/21/24 Yes Lawrnce Garvin PARAS, MD  senna-docusate (SENOKOT-S) 8.6-50 MG tablet Take 1 tablet by mouth 2 (two) times daily. 07/22/24 08/21/24 Yes Lawrnce Garvin PARAS, MD  traZODone  (DESYREL ) 100 MG tablet Take 1 tablet (100 mg total) by mouth at bedtime as needed for sleep. 07/22/24 08/21/24 Yes Lawrnce Garvin PARAS, MD    Patient Stressors: Financial difficulties   Health problems   Substance abuse    Patient Strengths: Manufacturing systems engineer  Motivation for treatment/growth   Treatment Modalities: Medication Management, Group therapy, Case management,  1 to 1 session with clinician, Psychoeducation, Recreational therapy.   Physician Treatment Plan for Primary and Secondary Diagnosis:  Final diagnoses:  Polysubstance abuse (HCC)  Fentanyl  use disorder, severe, dependence (HCC)   Marijuana user  IV drug user  Homelessness  Cocaine abuse (HCC)  Elevated liver enzymes   Long Term Goal(s): Improvement in symptoms so as ready for discharge  Short Term Goals: Patient will verbalize feelings in meetings with treatment team members. Patient will attend at least of 50% of the groups daily. Pt will complete the PHQ9 on admission, day 3 and discharge. Patient will participate in completing the [place] Suicide Severity Rating  Scale Patient will score a low risk of violence for 24 hours prior to discharge Patient will take medications as prescribed daily.  Medication Management: Evaluate patient's response, side effects, and tolerance of medication regimen.  Therapeutic Interventions: 1 to 1 sessions, Unit Group sessions and Medication administration.  Evaluation of Outcomes: Progressing  LCSW Treatment Plan for Primary Diagnosis:  Final diagnoses:  Polysubstance abuse (HCC)  Fentanyl  use disorder, severe, dependence (HCC)  Marijuana user  IV drug user  Homelessness  Cocaine abuse (HCC)  Elevated liver enzymes    Long Term Goal(s): Safe transition to appropriate next level of care at discharge.  Short Term Goals: Facilitate acceptance of mental health diagnosis and concerns through verbal commitment to aftercare plan and appointments at discharge., Patient will attend AA/NA groups as scheduled., Identify minimum of 2 triggers associated with mental health/substance abuse issues with treatment team members., and Increase skills for wellness and recovery by attending 50% of scheduled groups.  Therapeutic Interventions: Assess for all discharge needs, 1 to 1 time with Child psychotherapist, Explore available resources and support systems, Assess for adequacy in community support network, Educate family and significant other(s) on suicide prevention, Complete Psychosocial Assessment, Interpersonal group therapy.  Evaluation of Outcomes: Progressing   Progress in  Treatment: Attending groups: Yes. Participating in groups: Yes. Taking medication as prescribed: Yes. Toleration medication: Yes. Family/Significant other contact made: Client has made contact with individuals she can possibly live with until she's accepted into [facility].  Patient understands diagnosis: Yes. Discussing patient identified problems/goals with staff: Yes. Medical problems stabilized or resolved: Yes. Denies suicidal/homicidal ideation: Yes. Issues/concerns per patient self-inventory: No. Other: N/A  New problem(s) identified: None identified   New Short Term/Long Term Goal(s): Client will go to [facility] until her appointment date on 08/06/24 with [facility]  Patient Goals:  Enter into residential treatment to gain sobriety from polysubstance use.   Discharge Plan or Barriers: While staying at [facility] client will not engage in any drug use so on 08/06/24 [facility] can accept her.  Client must be clean upon entry into their program.   Reason for Continuation of Hospitalization: Withdrawal symptoms  Estimated Length of Stay: 3-5 days.   Last 3 [place] Suicide Severity Risk Score: Flowsheet Row ED from 07/28/2024 in [facility] ED from 07/10/2024 in [facility] ED from 07/08/2024 in [facility]  C-SSRS RISK CATEGORY No Risk No Risk No Risk    Last [facility] 2/9 Scores:    07/31/2024   11:17 AM 07/28/2024   11:35 PM 07/21/2024    5:07 PM  Depression screen PHQ 2/9  Decreased Interest 1 3 1   Down, Depressed, Hopeless 1 3 1   PHQ - 2 Score 2 6 2   Altered sleeping 1 3 1   Tired, decreased energy 0 3 1  Change in appetite 0 2 0  Feeling bad or failure about yourself  1 1 1   Trouble concentrating 1 3 1   Moving slowly or fidgety/restless 1 1 1   Suicidal thoughts 0 0 0  PHQ-9 Score 6 19 7   Difficult doing work/chores Somewhat difficult Very difficult Very difficult    Scribe for Treatment Team: Undra Harriman, LCSW 07/31/2024 2:10 PM

## 2024-07-31 NOTE — Care Management (Signed)
 [facility] Care Management.Scientist, Research (medical) met with the client to discuss discharge planning.  Client was informed she's been accepted into [facility] shelter starting on 08/01/24.  Peer Support Specialist will pick her up and transport her to the [facility].    Client will remain at [facility] until her appointment with [facility] on 08/06/24.  Client reports being hesitant about staying at the [facility] until the opening at [facility] because of known drug users.  Writer explored with the client her levels of motivation and stop thinking techniques to avoid a relapse until she can enter into [facility].

## 2024-08-01 DIAGNOSIS — F1123 Opioid dependence with withdrawal: Secondary | ICD-10-CM | POA: Diagnosis not present

## 2024-08-01 DIAGNOSIS — Z5900 Homelessness unspecified: Secondary | ICD-10-CM

## 2024-08-01 DIAGNOSIS — F1124 Opioid dependence with opioid-induced mood disorder: Secondary | ICD-10-CM | POA: Diagnosis not present

## 2024-08-01 DIAGNOSIS — R748 Abnormal levels of other serum enzymes: Secondary | ICD-10-CM

## 2024-08-01 DIAGNOSIS — F1290 Cannabis use, unspecified, uncomplicated: Secondary | ICD-10-CM

## 2024-08-01 DIAGNOSIS — F1410 Cocaine abuse, uncomplicated: Secondary | ICD-10-CM | POA: Diagnosis not present

## 2024-08-01 DIAGNOSIS — F331 Major depressive disorder, recurrent, moderate: Secondary | ICD-10-CM | POA: Diagnosis not present

## 2024-08-01 LAB — HCV AB W REFLEX TO QUANT PCR: HCV Ab: REACTIVE — AB

## 2024-08-01 LAB — HCV RT-PCR, QUANT (NON-GRAPH)
HCV log10: 6.393 {Log_IU}/mL
Hepatitis C Quantitation: 2470000 [IU]/mL

## 2024-08-01 MED ORDER — TRAZODONE HCL 100 MG PO TABS: 100.0000 mg | ORAL_TABLET | Freq: Every evening | ORAL | 0 refills | Status: AC | PRN

## 2024-08-01 MED ORDER — HYDROXYZINE HCL 50 MG PO TABS: 50.0000 mg | ORAL_TABLET | Freq: Four times a day (QID) | ORAL | 0 refills | Status: AC | PRN

## 2024-08-01 MED ORDER — BUSPIRONE HCL 10 MG PO TABS: 20.0000 mg | ORAL_TABLET | Freq: Two times a day (BID) | ORAL | 0 refills | Status: AC
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
## 2024-08-01 NOTE — ED Notes (Signed)
 Pt sleeping in bed, no acute distress noted. Respirations even and unlabored. Continue to monitor for safety.

## 2024-08-01 NOTE — Group Note (Signed)
 Group Topic: Communication  Group Date: 08/01/2024 Start Time: 0745 End Time: 0830 Facilitators: Judi Monico RAMAN, NT  Department: [facility]  Number of Participants: 7  Group Focus: check in Treatment Modality:  Psychoeducation Interventions utilized were patient education Purpose: express feelings and increase insight  Name: Morgan Donaldson Date of Birth: 1981/02/27  MR: [id number]    Level of Participation: active Quality of Participation: cooperative Interactions with others: gave feedback Mood/Affect: appropriate Triggers (if applicable): n/a Cognition: coherent/clear Progress: Moderate Response: Pt was able to express needs for the day and express understanding of unit expectations Plan: follow-up needed  Patients Problems:  Patient Active Problem List   Diagnosis Date Noted   Marijuana user 08/01/2024   Homelessness 08/01/2024   Cocaine abuse (HCC) 08/01/2024   Elevated liver enzymes 08/01/2024   Hyperthyroidism 07/16/2024   Opioid dependence with uncomplicated intoxication (HCC) 07/14/2024   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   Elevated liver function tests 07/14/2024   Opioid use with withdrawal (HCC) 07/14/2024   Polysubstance abuse (HCC) 07/10/2024   Pyosalpingitis 02/12/2024   Hepatitis C antibody detected 02/11/2024   PID (acute pelvic inflammatory disease) 02/10/2024   Gonorrhea 12/10/2023   Group A streptococcal infection 12/09/2023   IV drug abuse (HCC) 12/09/2023   Acute cystitis 12/09/2023   GAD (generalized anxiety disorder) 06/21/2020   Attention deficit hyperactivity disorder (ADHD), 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

## 2024-08-01 NOTE — ED Notes (Signed)
 Patient A&O x 4, ambulatory. Patient discharged in no acute distress. Patient denied SI/HI, A/VH upon discharge. Patient verbalized understanding of all discharge instructions reviewed on AVS via staff, to include follow up appointments, RX's and safety. Suicide safety plan completed and reviewed with clinical research associate. A copy given to pt.  Pt belongings returned to patient from locker # 26 intact. Patient escorted to lobby via staff for transport to destination. Safety maintained.

## 2024-08-01 NOTE — Care Management (Signed)
 FBC Care Management.Scientist, Research (medical) met with the client and reminded her the Peer Support Specialist will be here at 10am to to transport her to the pilot home through [facility].    Client got up and started getting ready.   Client has an appointment with [facility] on 08/06/24.  Writer will connect with the Peer Support Specialist to help maintain her sobriety until she can be accepted into [facility].  Client reports no suicidal and or homicidal ideations/plans.

## 2024-08-04 ENCOUNTER — Other Ambulatory Visit: Payer: Self-pay

## 2024-08-04 MED ORDER — TRAZODONE HCL 100 MG PO TABS
100.0000 mg | ORAL_TABLET | Freq: Every evening | ORAL | 0 refills | Status: AC | PRN
  Filled 2024-08-04: qty 10, 10d supply, fill #0

## 2024-08-04 MED ORDER — QUETIAPINE FUMARATE 100 MG PO TABS
100.0000 mg | ORAL_TABLET | Freq: Every morning | ORAL | 0 refills | Status: AC
  Filled 2024-08-04: qty 30, 30d supply, fill #0

## 2024-08-04 MED ORDER — TRAZODONE HCL 100 MG PO TABS
100.0000 mg | ORAL_TABLET | Freq: Every day | ORAL | 0 refills | Status: AC
  Filled 2024-08-04: qty 30, 30d supply, fill #0

## 2024-08-04 MED ORDER — QUETIAPINE FUMARATE 300 MG PO TABS
300.0000 mg | ORAL_TABLET | Freq: Every day | ORAL | 0 refills | Status: AC
  Filled 2024-08-04: qty 30, 30d supply, fill #0

## 2024-08-04 MED ORDER — BUSPIRONE HCL 10 MG PO TABS
20.0000 mg | ORAL_TABLET | Freq: Every day | ORAL | 0 refills | Status: AC
  Filled 2024-08-04: qty 30, 15d supply, fill #0

## 2024-08-04 MED ORDER — BUSPIRONE HCL 10 MG PO TABS
20.0000 mg | ORAL_TABLET | Freq: Two times a day (BID) | ORAL | 0 refills | Status: AC
  Filled 2024-08-04: qty 120, 30d supply, fill #0

## 2024-08-05 ENCOUNTER — Other Ambulatory Visit: Payer: Self-pay

## 2024-08-05 NOTE — Congregational Nurse Program (Signed)
  Dept: [phone]   Congregational Nurse Program Note  Date of Encounter: 08/05/2024  Past Medical History: Past Medical History:  Diagnosis Date   Bronchitis    Hydrosalpinx    bilateral   Palpitations    Physiological ovarian cysts     Encounter Details:  Community Questionnaire - 08/05/24 0908       Questionnaire   Ask client: Do you give verbal consent for me to treat you today? Yes    Student Assistance N/A    Location Patient Served  [facility]    Encounter Setting Phone/Text/Email;Other    Population Status Unhoused    Insurance Medicaid    Insurance/Financial Assistance Referral N/A    Medication Have Medication Insecurities;Provided Medication Assistance    Medical Provider No    Screening Referrals Made N/A    Medical Referrals Made N/A    Medical Appointment Completed N/A    CNP Interventions Case Management    Screenings CN Performed N/A    ED Visit Averted Yes    Life-Saving Intervention Made Yes         RN received call from case manager at [employer] in reference to client needing assistance with paying and delivery of several behavioral health medications. RN picked up prescriptions from [facility] and delivered them to [facility] pharmacy. They will be ready to pick up later this afternoon. RN will pick up and deliver to [facility] for client.

## 2024-09-04 ENCOUNTER — Encounter (HOSPITAL_COMMUNITY): Payer: Self-pay

## 2024-09-04 ENCOUNTER — Emergency Department (HOSPITAL_COMMUNITY)
Admission: EM | Admit: 2024-09-04 | Discharge: 2024-09-04 | Discharge status: Against Medical Advice | Attending: Emergency Medicine | Admitting: Emergency Medicine

## 2024-09-04 ENCOUNTER — Other Ambulatory Visit: Payer: Self-pay

## 2024-09-04 DIAGNOSIS — R197 Diarrhea, unspecified: Secondary | ICD-10-CM | POA: Insufficient documentation

## 2024-09-04 DIAGNOSIS — R112 Nausea with vomiting, unspecified: Secondary | ICD-10-CM | POA: Diagnosis present

## 2024-09-04 DIAGNOSIS — Z5321 Procedure and treatment not carried out due to patient leaving prior to being seen by health care provider: Secondary | ICD-10-CM | POA: Insufficient documentation

## 2024-09-04 LAB — URINALYSIS, ROUTINE W REFLEX MICROSCOPIC
Bilirubin Urine: NEGATIVE
Glucose, UA: NEGATIVE mg/dL
Hgb urine dipstick: NEGATIVE
Ketones, ur: NEGATIVE mg/dL
Leukocytes,Ua: NEGATIVE
Nitrite: NEGATIVE
Protein, ur: 30 mg/dL — AB
Specific Gravity, Urine: 1.031 — ABNORMAL HIGH (ref 1.005–1.030)
pH: 5 (ref 5.0–8.0)

## 2024-09-04 LAB — CBC WITH DIFFERENTIAL/PLATELET
Abs Immature Granulocytes: 0.07 K/uL (ref 0.00–0.07)
Basophils Absolute: 0 K/uL (ref 0.0–0.1)
Basophils Relative: 0 %
Eosinophils Absolute: 0.1 K/uL (ref 0.0–0.5)
Eosinophils Relative: 1 %
HCT: 43.9 % (ref 36.0–46.0)
Hemoglobin: 14.4 g/dL (ref 12.0–15.0)
Immature Granulocytes: 1 %
Lymphocytes Relative: 10 %
Lymphs Abs: 1.4 K/uL (ref 0.7–4.0)
MCH: 29.5 pg (ref 26.0–34.0)
MCHC: 32.8 g/dL (ref 30.0–36.0)
MCV: 90 fL (ref 80.0–100.0)
Monocytes Absolute: 0.6 K/uL (ref 0.1–1.0)
Monocytes Relative: 5 %
Neutro Abs: 11.4 K/uL — ABNORMAL HIGH (ref 1.7–7.7)
Neutrophils Relative %: 83 %
Platelets: 329 K/uL (ref 150–400)
RBC: 4.88 MIL/uL (ref 3.87–5.11)
RDW: 13.6 % (ref 11.5–15.5)
WBC: 13.6 K/uL — ABNORMAL HIGH (ref 4.0–10.5)
nRBC: 0 % (ref 0.0–0.2)

## 2024-09-04 LAB — COMPREHENSIVE METABOLIC PANEL WITH GFR
ALT: 105 U/L — ABNORMAL HIGH (ref 0–44)
AST: 89 U/L — ABNORMAL HIGH (ref 15–41)
Albumin: 4.4 g/dL (ref 3.5–5.0)
Alkaline Phosphatase: 60 U/L (ref 38–126)
Anion gap: 12 (ref 5–15)
BUN: 14 mg/dL (ref 6–20)
CO2: 28 mmol/L (ref 22–32)
Calcium: 9.5 mg/dL (ref 8.9–10.3)
Chloride: 101 mmol/L (ref 98–111)
Creatinine, Ser: 0.65 mg/dL (ref 0.44–1.00)
GFR, Estimated: >60 mL/min
Glucose, Bld: 76 mg/dL (ref 70–99)
Potassium: 3.6 mmol/L (ref 3.5–5.1)
Sodium: 140 mmol/L (ref 135–145)
Total Bilirubin: 0.3 mg/dL (ref 0.0–1.2)
Total Protein: 7.4 g/dL (ref 6.5–8.1)

## 2024-09-04 LAB — HCG, SERUM, QUALITATIVE: Preg, Serum: NEGATIVE

## 2024-09-04 LAB — LIPASE, BLOOD: Lipase: 30 U/L (ref 11–51)

## 2024-09-04 MED ORDER — ONDANSETRON 4 MG PO TBDP
4.0000 mg | ORAL_TABLET | Freq: Once | ORAL | Status: AC
  Administered 2024-09-04: 4 mg via ORAL
  Filled 2024-09-04: qty 1

## 2024-09-04 NOTE — ED Triage Notes (Addendum)
 Pt states bf had active herpes and she gave him oral sex and pt states herpes is now in lungs. Axox4. C/O bilateral leg pain . Pt is also here for nausea and states every time she does crack she get nauseous. Pt last did cocaine yesterday. Pt states she is having a different type of nausea.

## 2024-09-04 NOTE — ED Notes (Signed)
 Called for patient to room x 2 with no response

## 2024-09-04 NOTE — ED Triage Notes (Signed)
 Pt arrives via POV. Pt reports nausea, vomiting, and diarrhea for the past two days. Pt AxOx4.

## 2024-09-04 NOTE — ED Provider Triage Note (Signed)
 Emergency Medicine Provider Triage Evaluation Note  DELAINEE TRAMEL , a 44 y.o. female  was evaluated in triage.  Pt complains of nausea, vomiting, and diarrhea for the past two days. Denies any belly pain or fever. Reports this happened after eating soup from [employer].  Review of Systems  Positive:  Negative:   Physical Exam  BP 132/73 (BP Location: Right Arm)   Pulse 80   Temp 98.4 F (36.9 C)   Resp 17   Ht 5' 7 (1.702 m)   SpO2 98%   BMI 23.02 kg/m  Gen:   Awake, no distress   Resp:  Normal effort  MSK:   Moves extremities without difficulty  Other:  Abdomen soft and non tender  Medical Decision Making  Medically screening exam initiated at 12:23 PM.  Appropriate orders placed.  MCKENNAH KRETCHMER was informed that the remainder of the evaluation will be completed by another provider, this initial triage assessment does not replace that evaluation, and the importance of remaining in the ED until their evaluation is complete.  Zofran  ordered.    Bernis Ernst, PA-C 09/04/24 1224
